# Patient Record
Sex: Female | Born: 1935 | Race: White | Hispanic: No | Marital: Married | Smoking: Former smoker
Health system: Southern US, Community
[De-identification: ages and names within clinical notes are randomized; demographics above are authoritative.]

## PROBLEM LIST (undated history)

## (undated) DIAGNOSIS — R0602 Shortness of breath: Secondary | ICD-10-CM

## (undated) DIAGNOSIS — K219 Gastro-esophageal reflux disease without esophagitis: Secondary | ICD-10-CM

## (undated) DIAGNOSIS — C52 Malignant neoplasm of vagina: Secondary | ICD-10-CM

## (undated) DIAGNOSIS — K449 Diaphragmatic hernia without obstruction or gangrene: Secondary | ICD-10-CM

## (undated) DIAGNOSIS — E119 Type 2 diabetes mellitus without complications: Secondary | ICD-10-CM

## (undated) DIAGNOSIS — N893 Dysplasia of vagina, unspecified: Secondary | ICD-10-CM

## (undated) DIAGNOSIS — M549 Dorsalgia, unspecified: Secondary | ICD-10-CM

## (undated) DIAGNOSIS — E039 Hypothyroidism, unspecified: Secondary | ICD-10-CM

## (undated) DIAGNOSIS — Z8679 Personal history of other diseases of the circulatory system: Secondary | ICD-10-CM

## (undated) DIAGNOSIS — T8859XA Other complications of anesthesia, initial encounter: Secondary | ICD-10-CM

## (undated) DIAGNOSIS — D126 Benign neoplasm of colon, unspecified: Secondary | ICD-10-CM

## (undated) DIAGNOSIS — R269 Unspecified abnormalities of gait and mobility: Secondary | ICD-10-CM

## (undated) DIAGNOSIS — M1990 Unspecified osteoarthritis, unspecified site: Secondary | ICD-10-CM

## (undated) DIAGNOSIS — Z9889 Other specified postprocedural states: Secondary | ICD-10-CM

## (undated) DIAGNOSIS — E785 Hyperlipidemia, unspecified: Secondary | ICD-10-CM

## (undated) DIAGNOSIS — K589 Irritable bowel syndrome without diarrhea: Secondary | ICD-10-CM

## (undated) DIAGNOSIS — L719 Rosacea, unspecified: Secondary | ICD-10-CM

## (undated) DIAGNOSIS — I509 Heart failure, unspecified: Secondary | ICD-10-CM

## (undated) DIAGNOSIS — R112 Nausea with vomiting, unspecified: Secondary | ICD-10-CM

## (undated) DIAGNOSIS — D509 Iron deficiency anemia, unspecified: Secondary | ICD-10-CM

## (undated) DIAGNOSIS — I10 Essential (primary) hypertension: Secondary | ICD-10-CM

## (undated) DIAGNOSIS — E669 Obesity, unspecified: Secondary | ICD-10-CM

## (undated) DIAGNOSIS — R42 Dizziness and giddiness: Secondary | ICD-10-CM

## (undated) DIAGNOSIS — T4145XA Adverse effect of unspecified anesthetic, initial encounter: Secondary | ICD-10-CM

## (undated) DIAGNOSIS — I4891 Unspecified atrial fibrillation: Secondary | ICD-10-CM

## (undated) HISTORY — DX: Personal history of other diseases of the circulatory system: Z86.79

## (undated) HISTORY — DX: Unspecified abnormalities of gait and mobility: R26.9

## (undated) HISTORY — DX: Dysplasia of vagina, unspecified: N89.3

## (undated) HISTORY — DX: Irritable bowel syndrome, unspecified: K58.9

## (undated) HISTORY — DX: Gastro-esophageal reflux disease without esophagitis: K21.9

## (undated) HISTORY — DX: Dizziness and giddiness: R42

## (undated) HISTORY — DX: Benign neoplasm of colon, unspecified: D12.6

## (undated) HISTORY — DX: Dorsalgia, unspecified: M54.9

## (undated) HISTORY — DX: Essential (primary) hypertension: I10

## (undated) HISTORY — DX: Hyperlipidemia, unspecified: E78.5

## (undated) HISTORY — DX: Obesity, unspecified: E66.9

## (undated) HISTORY — DX: Unspecified atrial fibrillation: I48.91

## (undated) HISTORY — DX: Diaphragmatic hernia without obstruction or gangrene: K44.9

## (undated) HISTORY — DX: Hypothyroidism, unspecified: E03.9

## (undated) HISTORY — DX: Rosacea, unspecified: L71.9

## (undated) HISTORY — PX: JOINT REPLACEMENT: SHX530

---
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
## 1961-08-01 HISTORY — PX: ABDOMINAL HYSTERECTOMY: SHX81

## 1961-08-01 HISTORY — PX: CERVICAL CONE BIOPSY: SUR198

## 1976-08-01 HISTORY — PX: CYSTOCELE REPAIR: SHX163

## 1978-08-01 DIAGNOSIS — N893 Dysplasia of vagina, unspecified: Secondary | ICD-10-CM

## 1978-08-01 HISTORY — DX: Dysplasia of vagina, unspecified: N89.3

## 1991-08-02 HISTORY — PX: TOTAL KNEE ARTHROPLASTY: SHX125

## 1998-09-03 ENCOUNTER — Other Ambulatory Visit: Admission: RE | Admit: 1998-09-03 | Discharge: 1998-09-03 | Payer: Self-pay | Admitting: Obstetrics and Gynecology

## 1999-08-02 HISTORY — PX: TOTAL KNEE ARTHROPLASTY: SHX125

## 1999-09-15 ENCOUNTER — Encounter: Admission: RE | Admit: 1999-09-15 | Discharge: 1999-09-15 | Payer: Self-pay | Admitting: Obstetrics and Gynecology

## 1999-09-15 ENCOUNTER — Encounter: Payer: Self-pay | Admitting: Obstetrics and Gynecology

## 1999-09-15 ENCOUNTER — Other Ambulatory Visit: Admission: RE | Admit: 1999-09-15 | Discharge: 1999-09-15 | Payer: Self-pay | Admitting: Obstetrics and Gynecology

## 1999-09-15 ENCOUNTER — Encounter: Payer: Self-pay | Admitting: Emergency Medicine

## 1999-09-16 ENCOUNTER — Inpatient Hospital Stay (HOSPITAL_COMMUNITY): Admission: EM | Admit: 1999-09-16 | Discharge: 1999-09-18 | Payer: Self-pay | Admitting: Emergency Medicine

## 1999-09-17 ENCOUNTER — Encounter: Payer: Self-pay | Admitting: Family Medicine

## 1999-09-24 ENCOUNTER — Encounter: Admission: RE | Admit: 1999-09-24 | Discharge: 1999-09-24 | Payer: Self-pay | Admitting: Obstetrics and Gynecology

## 1999-09-24 ENCOUNTER — Encounter: Payer: Self-pay | Admitting: Obstetrics and Gynecology

## 1999-11-17 ENCOUNTER — Encounter: Payer: Self-pay | Admitting: Orthopedic Surgery

## 1999-11-17 ENCOUNTER — Inpatient Hospital Stay (HOSPITAL_COMMUNITY): Admission: RE | Admit: 1999-11-17 | Discharge: 1999-11-22 | Payer: Self-pay | Admitting: Orthopedic Surgery

## 1999-11-22 ENCOUNTER — Inpatient Hospital Stay (HOSPITAL_COMMUNITY)
Admission: RE | Admit: 1999-11-22 | Discharge: 1999-11-26 | Payer: Self-pay | Admitting: Physical Medicine & Rehabilitation

## 1999-12-20 ENCOUNTER — Encounter: Admission: RE | Admit: 1999-12-20 | Discharge: 2000-01-17 | Payer: Self-pay | Admitting: Orthopedic Surgery

## 2000-02-15 ENCOUNTER — Ambulatory Visit (HOSPITAL_COMMUNITY): Admission: RE | Admit: 2000-02-15 | Discharge: 2000-02-15 | Payer: Self-pay | Admitting: Orthopedic Surgery

## 2000-06-01 ENCOUNTER — Encounter: Admission: RE | Admit: 2000-06-01 | Discharge: 2000-06-29 | Payer: Self-pay | Admitting: Orthopedic Surgery

## 2000-09-15 ENCOUNTER — Encounter: Payer: Self-pay | Admitting: Obstetrics and Gynecology

## 2000-09-15 ENCOUNTER — Encounter: Admission: RE | Admit: 2000-09-15 | Discharge: 2000-09-15 | Payer: Self-pay | Admitting: Obstetrics and Gynecology

## 2000-09-18 ENCOUNTER — Other Ambulatory Visit: Admission: RE | Admit: 2000-09-18 | Discharge: 2000-09-18 | Payer: Self-pay | Admitting: Obstetrics and Gynecology

## 2001-05-04 ENCOUNTER — Encounter: Admission: RE | Admit: 2001-05-04 | Discharge: 2001-08-02 | Payer: Self-pay | Admitting: Family Medicine

## 2001-08-28 ENCOUNTER — Encounter: Admission: RE | Admit: 2001-08-28 | Discharge: 2001-11-26 | Payer: Self-pay | Admitting: Internal Medicine

## 2001-09-19 ENCOUNTER — Encounter: Payer: Self-pay | Admitting: Obstetrics and Gynecology

## 2001-09-19 ENCOUNTER — Encounter: Admission: RE | Admit: 2001-09-19 | Discharge: 2001-09-19 | Payer: Self-pay | Admitting: Obstetrics and Gynecology

## 2001-10-02 ENCOUNTER — Other Ambulatory Visit: Admission: RE | Admit: 2001-10-02 | Discharge: 2001-10-02 | Payer: Self-pay | Admitting: Obstetrics and Gynecology

## 2002-10-16 ENCOUNTER — Encounter: Admission: RE | Admit: 2002-10-16 | Discharge: 2002-10-16 | Payer: Self-pay | Admitting: Obstetrics and Gynecology

## 2002-10-16 ENCOUNTER — Encounter: Payer: Self-pay | Admitting: Obstetrics and Gynecology

## 2002-11-30 HISTORY — PX: CARDIAC CATHETERIZATION: SHX172

## 2002-12-02 ENCOUNTER — Other Ambulatory Visit: Admission: RE | Admit: 2002-12-02 | Discharge: 2002-12-02 | Payer: Self-pay | Admitting: Obstetrics and Gynecology

## 2002-12-10 ENCOUNTER — Inpatient Hospital Stay (HOSPITAL_COMMUNITY): Admission: EM | Admit: 2002-12-10 | Discharge: 2002-12-11 | Payer: Self-pay | Admitting: Emergency Medicine

## 2002-12-10 ENCOUNTER — Encounter: Payer: Self-pay | Admitting: Cardiology

## 2002-12-16 ENCOUNTER — Ambulatory Visit (HOSPITAL_COMMUNITY): Admission: RE | Admit: 2002-12-16 | Discharge: 2002-12-16 | Payer: Self-pay | Admitting: Cardiology

## 2002-12-16 ENCOUNTER — Encounter: Payer: Self-pay | Admitting: Cardiology

## 2002-12-19 ENCOUNTER — Encounter: Payer: Self-pay | Admitting: Cardiology

## 2002-12-19 ENCOUNTER — Encounter: Admission: RE | Admit: 2002-12-19 | Discharge: 2002-12-19 | Payer: Self-pay | Admitting: Cardiology

## 2003-06-20 ENCOUNTER — Encounter: Admission: RE | Admit: 2003-06-20 | Discharge: 2003-06-20 | Payer: Self-pay | Admitting: Cardiology

## 2003-07-22 ENCOUNTER — Encounter: Admission: RE | Admit: 2003-07-22 | Discharge: 2003-07-22 | Payer: Self-pay | Admitting: Orthopedic Surgery

## 2003-07-24 ENCOUNTER — Encounter: Admission: RE | Admit: 2003-07-24 | Discharge: 2003-07-24 | Payer: Self-pay | Admitting: Orthopedic Surgery

## 2003-11-12 ENCOUNTER — Encounter: Admission: RE | Admit: 2003-11-12 | Discharge: 2003-11-12 | Payer: Self-pay | Admitting: Obstetrics and Gynecology

## 2003-12-10 ENCOUNTER — Other Ambulatory Visit: Admission: RE | Admit: 2003-12-10 | Discharge: 2003-12-10 | Payer: Self-pay | Admitting: Obstetrics and Gynecology

## 2003-12-15 ENCOUNTER — Encounter (INDEPENDENT_AMBULATORY_CARE_PROVIDER_SITE_OTHER): Payer: Self-pay | Admitting: *Deleted

## 2003-12-15 ENCOUNTER — Observation Stay (HOSPITAL_COMMUNITY): Admission: RE | Admit: 2003-12-15 | Discharge: 2003-12-16 | Payer: Self-pay | Admitting: *Deleted

## 2003-12-15 ENCOUNTER — Encounter (INDEPENDENT_AMBULATORY_CARE_PROVIDER_SITE_OTHER): Payer: Self-pay | Admitting: Specialist

## 2004-05-25 ENCOUNTER — Encounter: Payer: Self-pay | Admitting: Internal Medicine

## 2004-05-25 ENCOUNTER — Emergency Department (HOSPITAL_COMMUNITY): Admission: EM | Admit: 2004-05-25 | Discharge: 2004-05-25 | Payer: Self-pay | Admitting: Emergency Medicine

## 2004-07-05 ENCOUNTER — Ambulatory Visit: Payer: Self-pay | Admitting: Internal Medicine

## 2004-07-09 ENCOUNTER — Ambulatory Visit: Payer: Self-pay | Admitting: Internal Medicine

## 2004-07-14 ENCOUNTER — Encounter: Admission: RE | Admit: 2004-07-14 | Discharge: 2004-07-14 | Payer: Self-pay | Admitting: Cardiology

## 2004-07-15 ENCOUNTER — Ambulatory Visit: Payer: Self-pay | Admitting: Internal Medicine

## 2004-08-01 HISTORY — PX: ORIF ANKLE FRACTURE: SUR919

## 2004-08-01 HISTORY — PX: CHOLECYSTECTOMY: SHX55

## 2004-08-01 HISTORY — PX: CATARACT EXTRACTION W/ INTRAOCULAR LENS  IMPLANT, BILATERAL: SHX1307

## 2004-08-17 ENCOUNTER — Ambulatory Visit: Payer: Self-pay | Admitting: Internal Medicine

## 2004-09-15 ENCOUNTER — Ambulatory Visit: Payer: Self-pay | Admitting: Internal Medicine

## 2004-12-13 ENCOUNTER — Ambulatory Visit: Payer: Self-pay | Admitting: Internal Medicine

## 2004-12-15 ENCOUNTER — Encounter: Admission: RE | Admit: 2004-12-15 | Discharge: 2004-12-15 | Payer: Self-pay | Admitting: Obstetrics and Gynecology

## 2004-12-16 ENCOUNTER — Ambulatory Visit: Payer: Self-pay | Admitting: Internal Medicine

## 2005-01-14 ENCOUNTER — Encounter: Admission: RE | Admit: 2005-01-14 | Discharge: 2005-01-14 | Payer: Self-pay | Admitting: Cardiology

## 2005-04-05 ENCOUNTER — Ambulatory Visit: Payer: Self-pay | Admitting: Internal Medicine

## 2005-04-07 ENCOUNTER — Ambulatory Visit: Payer: Self-pay | Admitting: Internal Medicine

## 2005-04-21 ENCOUNTER — Encounter: Admission: RE | Admit: 2005-04-21 | Discharge: 2005-04-21 | Payer: Self-pay | Admitting: Orthopedic Surgery

## 2005-05-01 ENCOUNTER — Ambulatory Visit: Payer: Self-pay | Admitting: Physical Medicine & Rehabilitation

## 2005-05-01 ENCOUNTER — Inpatient Hospital Stay (HOSPITAL_COMMUNITY): Admission: EM | Admit: 2005-05-01 | Discharge: 2005-05-06 | Payer: Self-pay | Admitting: Emergency Medicine

## 2005-05-04 ENCOUNTER — Encounter: Payer: Self-pay | Admitting: Neurosurgery

## 2005-05-06 ENCOUNTER — Inpatient Hospital Stay
Admission: RE | Admit: 2005-05-06 | Discharge: 2005-05-13 | Payer: Self-pay | Admitting: Physical Medicine & Rehabilitation

## 2005-08-30 ENCOUNTER — Ambulatory Visit: Payer: Self-pay | Admitting: Internal Medicine

## 2005-09-22 ENCOUNTER — Emergency Department (HOSPITAL_COMMUNITY): Admission: EM | Admit: 2005-09-22 | Discharge: 2005-09-22 | Payer: Self-pay | Admitting: Family Medicine

## 2005-09-22 ENCOUNTER — Ambulatory Visit: Payer: Self-pay | Admitting: Internal Medicine

## 2005-11-30 ENCOUNTER — Ambulatory Visit: Payer: Self-pay | Admitting: Internal Medicine

## 2006-01-24 ENCOUNTER — Ambulatory Visit: Payer: Self-pay | Admitting: Internal Medicine

## 2006-02-06 ENCOUNTER — Encounter: Payer: Self-pay | Admitting: Cardiology

## 2006-02-06 ENCOUNTER — Inpatient Hospital Stay (HOSPITAL_COMMUNITY): Admission: EM | Admit: 2006-02-06 | Discharge: 2006-02-07 | Payer: Self-pay | Admitting: Emergency Medicine

## 2006-03-03 ENCOUNTER — Encounter: Payer: Self-pay | Admitting: Internal Medicine

## 2006-04-25 ENCOUNTER — Encounter: Payer: Self-pay | Admitting: Internal Medicine

## 2006-05-26 ENCOUNTER — Ambulatory Visit: Payer: Self-pay | Admitting: Internal Medicine

## 2006-08-15 ENCOUNTER — Encounter: Payer: Self-pay | Admitting: Internal Medicine

## 2006-09-26 ENCOUNTER — Ambulatory Visit: Payer: Self-pay | Admitting: Internal Medicine

## 2006-09-27 ENCOUNTER — Encounter: Admission: RE | Admit: 2006-09-27 | Discharge: 2006-09-27 | Payer: Self-pay | Admitting: Obstetrics and Gynecology

## 2007-01-12 DIAGNOSIS — K219 Gastro-esophageal reflux disease without esophagitis: Secondary | ICD-10-CM | POA: Insufficient documentation

## 2007-01-12 DIAGNOSIS — L719 Rosacea, unspecified: Secondary | ICD-10-CM

## 2007-01-12 DIAGNOSIS — K449 Diaphragmatic hernia without obstruction or gangrene: Secondary | ICD-10-CM | POA: Insufficient documentation

## 2007-01-12 DIAGNOSIS — I10 Essential (primary) hypertension: Secondary | ICD-10-CM | POA: Insufficient documentation

## 2007-01-12 DIAGNOSIS — E119 Type 2 diabetes mellitus without complications: Secondary | ICD-10-CM | POA: Insufficient documentation

## 2007-01-12 DIAGNOSIS — E785 Hyperlipidemia, unspecified: Secondary | ICD-10-CM

## 2007-01-12 DIAGNOSIS — E039 Hypothyroidism, unspecified: Secondary | ICD-10-CM | POA: Insufficient documentation

## 2007-01-12 DIAGNOSIS — I4891 Unspecified atrial fibrillation: Secondary | ICD-10-CM | POA: Insufficient documentation

## 2007-01-29 ENCOUNTER — Ambulatory Visit: Payer: Self-pay | Admitting: Internal Medicine

## 2007-01-30 LAB — CONVERTED CEMR LAB
ALT: 18 units/L (ref 0–35)
CO2: 33 meq/L — ABNORMAL HIGH (ref 19–32)
Calcium: 9.3 mg/dL (ref 8.4–10.5)
Chloride: 103 meq/L (ref 96–112)
Cholesterol: 122 mg/dL (ref 0–200)
Free T4: 1.2 ng/dL (ref 0.6–1.6)
GFR calc Af Amer: 77 mL/min
Glucose, Bld: 123 mg/dL — ABNORMAL HIGH (ref 70–99)
HDL: 35.8 mg/dL — ABNORMAL LOW (ref >39.0)
VLDL: 20 mg/dL (ref 0–40)

## 2007-02-22 ENCOUNTER — Ambulatory Visit: Payer: Self-pay | Admitting: Internal Medicine

## 2007-02-22 LAB — CONVERTED CEMR LAB: INR: 2.2

## 2007-03-22 ENCOUNTER — Ambulatory Visit: Payer: Self-pay | Admitting: Internal Medicine

## 2007-04-05 ENCOUNTER — Ambulatory Visit: Payer: Self-pay | Admitting: Internal Medicine

## 2007-04-05 LAB — CONVERTED CEMR LAB: INR: 2

## 2007-05-03 ENCOUNTER — Ambulatory Visit: Payer: Self-pay | Admitting: Internal Medicine

## 2007-05-03 LAB — CONVERTED CEMR LAB

## 2007-06-05 ENCOUNTER — Ambulatory Visit: Payer: Self-pay | Admitting: Internal Medicine

## 2007-06-05 LAB — CONVERTED CEMR LAB: INR: 1.7

## 2007-06-06 ENCOUNTER — Telehealth: Payer: Self-pay | Admitting: Internal Medicine

## 2007-06-06 LAB — CONVERTED CEMR LAB
ALT: 16 units/L (ref 0–35)
AST: 19 units/L (ref 0–37)
Albumin: 3.5 g/dL (ref 3.5–5.2)
Alkaline Phosphatase: 40 units/L (ref 39–117)
Basophils Absolute: 0.1 10*3/uL (ref 0.0–0.1)
CO2: 32 meq/L (ref 19–32)
Creatinine, Ser: 1 mg/dL (ref 0.4–1.2)
HCT: 26.6 % — ABNORMAL LOW (ref 36.0–46.0)
MCHC: 33.1 g/dL (ref 30.0–36.0)
Monocytes Relative: 9.9 % (ref 3.0–11.0)
Phosphorus: 3 mg/dL (ref 2.3–4.6)
Platelets: 354 10*3/uL (ref 150–400)
RBC: 3.4 M/uL — ABNORMAL LOW (ref 3.87–5.11)
RDW: 17.9 % — ABNORMAL HIGH (ref 11.5–14.6)
Sodium: 140 meq/L (ref 135–145)

## 2007-06-08 ENCOUNTER — Encounter (INDEPENDENT_AMBULATORY_CARE_PROVIDER_SITE_OTHER): Payer: Self-pay | Admitting: *Deleted

## 2007-06-15 ENCOUNTER — Telehealth: Payer: Self-pay | Admitting: Internal Medicine

## 2007-06-18 ENCOUNTER — Ambulatory Visit: Payer: Self-pay | Admitting: Internal Medicine

## 2007-06-18 LAB — CONVERTED CEMR LAB
INR: 1.7
Prothrombin Time: 15.8 s

## 2007-06-22 ENCOUNTER — Telehealth (INDEPENDENT_AMBULATORY_CARE_PROVIDER_SITE_OTHER): Payer: Self-pay | Admitting: *Deleted

## 2007-06-29 ENCOUNTER — Ambulatory Visit: Payer: Self-pay | Admitting: Family Medicine

## 2007-06-29 DIAGNOSIS — L538 Other specified erythematous conditions: Secondary | ICD-10-CM

## 2007-06-29 LAB — CONVERTED CEMR LAB
Bilirubin Urine: NEGATIVE
Glucose, Urine, Semiquant: 100
Nitrite: POSITIVE
Prothrombin Time: 19.5 s
Specific Gravity, Urine: 1.01
pH: 6

## 2007-07-05 ENCOUNTER — Ambulatory Visit: Payer: Self-pay | Admitting: Internal Medicine

## 2007-07-09 ENCOUNTER — Ambulatory Visit: Payer: Self-pay | Admitting: Internal Medicine

## 2007-07-10 LAB — CONVERTED CEMR LAB
Eosinophils Absolute: 0.2 10*3/uL (ref 0.0–0.6)
Eosinophils Relative: 2.3 % (ref 0.0–5.0)
Hgb A1c MFr Bld: 10.3 % — ABNORMAL HIGH (ref 4.6–6.0)
MCV: 77.5 fL — ABNORMAL LOW (ref 78.0–100.0)
Monocytes Relative: 6.3 % (ref 3.0–11.0)
Platelets: 352 10*3/uL (ref 150–400)
RBC: 4.47 M/uL (ref 3.87–5.11)
WBC: 8.1 10*3/uL (ref 4.5–10.5)

## 2007-07-19 ENCOUNTER — Ambulatory Visit: Payer: Self-pay | Admitting: Internal Medicine

## 2007-07-19 LAB — CONVERTED CEMR LAB
INR: 3.4
Prothrombin Time: 22.2 s

## 2007-07-27 ENCOUNTER — Ambulatory Visit: Payer: Self-pay | Admitting: Internal Medicine

## 2007-07-30 ENCOUNTER — Ambulatory Visit: Payer: Self-pay | Admitting: Internal Medicine

## 2007-07-30 LAB — CONVERTED CEMR LAB: Prothrombin Time: 18.6 s

## 2007-07-31 ENCOUNTER — Telehealth: Payer: Self-pay | Admitting: Internal Medicine

## 2007-08-13 ENCOUNTER — Ambulatory Visit: Payer: Self-pay | Admitting: Internal Medicine

## 2007-08-13 LAB — CONVERTED CEMR LAB: INR: 3.4

## 2007-09-03 ENCOUNTER — Ambulatory Visit: Payer: Self-pay | Admitting: Family Medicine

## 2007-09-03 ENCOUNTER — Ambulatory Visit: Payer: Self-pay | Admitting: Internal Medicine

## 2007-09-03 DIAGNOSIS — K589 Irritable bowel syndrome without diarrhea: Secondary | ICD-10-CM | POA: Insufficient documentation

## 2007-09-04 LAB — CONVERTED CEMR LAB
INR: 2.4 — ABNORMAL HIGH (ref 0.0–1.5)
Prothrombin Time: 27.2 s — ABNORMAL HIGH (ref 11.6–15.2)

## 2007-09-05 ENCOUNTER — Encounter: Admission: RE | Admit: 2007-09-05 | Discharge: 2007-09-05 | Payer: Self-pay | Admitting: Family Medicine

## 2007-09-05 ENCOUNTER — Telehealth (INDEPENDENT_AMBULATORY_CARE_PROVIDER_SITE_OTHER): Payer: Self-pay | Admitting: *Deleted

## 2007-10-09 ENCOUNTER — Ambulatory Visit: Payer: Self-pay | Admitting: Family Medicine

## 2007-10-09 LAB — CONVERTED CEMR LAB
H Pylori IgG: NEGATIVE
INR: 2.8
Prothrombin Time: 20.1 s

## 2007-10-15 LAB — CONVERTED CEMR LAB
ALT: 19 units/L (ref 0–35)
BUN: 15 mg/dL (ref 6–23)
Basophils Relative: 1.6 % — ABNORMAL HIGH (ref 0.0–1.0)
CO2: 30 meq/L (ref 19–32)
Creatinine, Ser: 0.9 mg/dL (ref 0.4–1.2)
Eosinophils Absolute: 0.2 10*3/uL (ref 0.0–0.6)
Eosinophils Relative: 3.5 % (ref 0.0–5.0)
Ferritin: 17.6 ng/mL (ref 10.0–291.0)
Glucose, Bld: 167 mg/dL — ABNORMAL HIGH (ref 70–99)
HCT: 31.3 % — ABNORMAL LOW (ref 36.0–46.0)
Hemoglobin: 10.6 g/dL — ABNORMAL LOW (ref 12.0–15.0)
Hgb A1c MFr Bld: 8.8 % — ABNORMAL HIGH (ref 4.6–6.0)
Lymphocytes Relative: 22.1 % (ref 12.0–46.0)
MCV: 88.7 fL (ref 78.0–100.0)
Neutro Abs: 4.1 10*3/uL (ref 1.4–7.7)
Neutrophils Relative %: 62.4 % (ref 43.0–77.0)
Phosphorus: 3.4 mg/dL (ref 2.3–4.6)
Potassium: 3.4 meq/L — ABNORMAL LOW (ref 3.5–5.1)
Triglycerides: 151 mg/dL — ABNORMAL HIGH (ref 0–149)
VLDL: 30 mg/dL (ref 0–40)
WBC: 6.5 10*3/uL (ref 4.5–10.5)

## 2007-10-16 ENCOUNTER — Encounter: Payer: Self-pay | Admitting: Family Medicine

## 2007-10-16 ENCOUNTER — Emergency Department (HOSPITAL_COMMUNITY): Admission: EM | Admit: 2007-10-16 | Discharge: 2007-10-16 | Payer: Self-pay | Admitting: Family Medicine

## 2007-10-19 ENCOUNTER — Ambulatory Visit: Payer: Self-pay | Admitting: Family Medicine

## 2007-10-31 ENCOUNTER — Ambulatory Visit: Payer: Self-pay | Admitting: Family Medicine

## 2007-11-07 ENCOUNTER — Telehealth: Payer: Self-pay | Admitting: Family Medicine

## 2007-11-14 ENCOUNTER — Encounter: Payer: Self-pay | Admitting: Family Medicine

## 2007-11-19 ENCOUNTER — Ambulatory Visit: Payer: Self-pay | Admitting: Family Medicine

## 2007-11-19 LAB — CONVERTED CEMR LAB: INR: 6.3

## 2007-11-20 LAB — CONVERTED CEMR LAB
INR: 6.8 (ref 0.8–1.0)
Prothrombin Time: 64.4 s (ref 10.9–13.3)

## 2007-11-22 ENCOUNTER — Ambulatory Visit: Payer: Self-pay | Admitting: Family Medicine

## 2007-11-22 LAB — CONVERTED CEMR LAB: Prothrombin Time: 19.5 s

## 2007-12-03 ENCOUNTER — Ambulatory Visit: Payer: Self-pay | Admitting: Family Medicine

## 2007-12-03 LAB — CONVERTED CEMR LAB: Prothrombin Time: 16 s

## 2007-12-10 ENCOUNTER — Ambulatory Visit: Payer: Self-pay | Admitting: Family Medicine

## 2007-12-10 LAB — CONVERTED CEMR LAB: INR: 2

## 2007-12-25 ENCOUNTER — Ambulatory Visit: Payer: Self-pay | Admitting: Family Medicine

## 2007-12-31 DIAGNOSIS — K449 Diaphragmatic hernia without obstruction or gangrene: Secondary | ICD-10-CM

## 2007-12-31 DIAGNOSIS — D126 Benign neoplasm of colon, unspecified: Secondary | ICD-10-CM

## 2007-12-31 HISTORY — DX: Diaphragmatic hernia without obstruction or gangrene: K44.9

## 2007-12-31 HISTORY — DX: Benign neoplasm of colon, unspecified: D12.6

## 2008-01-08 ENCOUNTER — Ambulatory Visit: Payer: Self-pay | Admitting: Family Medicine

## 2008-01-09 LAB — CONVERTED CEMR LAB
Albumin: 3.5 g/dL (ref 3.5–5.2)
Basophils Absolute: 0.1 10*3/uL (ref 0.0–0.1)
Calcium: 9.3 mg/dL (ref 8.4–10.5)
Eosinophils Absolute: 0.2 10*3/uL (ref 0.0–0.7)
GFR calc Af Amer: 55 mL/min
GFR calc non Af Amer: 46 mL/min
HCT: 33.2 % — ABNORMAL LOW (ref 36.0–46.0)
Hemoglobin: 11.2 g/dL — ABNORMAL LOW (ref 12.0–15.0)
MCHC: 33.6 g/dL (ref 30.0–36.0)
Monocytes Absolute: 0.7 10*3/uL (ref 0.1–1.0)
Neutro Abs: 4.7 10*3/uL (ref 1.4–7.7)
Phosphorus: 3.5 mg/dL (ref 2.3–4.6)
Platelets: 298 10*3/uL (ref 150–400)
Potassium: 4.6 meq/L (ref 3.5–5.1)
RDW: 16.1 % — ABNORMAL HIGH (ref 11.5–14.6)
Sodium: 141 meq/L (ref 135–145)

## 2008-01-17 ENCOUNTER — Ambulatory Visit: Payer: Self-pay | Admitting: Gastroenterology

## 2008-01-21 ENCOUNTER — Ambulatory Visit: Payer: Self-pay | Admitting: Internal Medicine

## 2008-01-21 LAB — CONVERTED CEMR LAB

## 2008-01-22 ENCOUNTER — Telehealth: Payer: Self-pay | Admitting: Gastroenterology

## 2008-01-25 ENCOUNTER — Encounter: Payer: Self-pay | Admitting: Gastroenterology

## 2008-01-25 ENCOUNTER — Ambulatory Visit: Payer: Self-pay | Admitting: Gastroenterology

## 2008-01-29 ENCOUNTER — Encounter: Payer: Self-pay | Admitting: Gastroenterology

## 2008-02-04 ENCOUNTER — Ambulatory Visit: Payer: Self-pay | Admitting: Family Medicine

## 2008-02-06 ENCOUNTER — Telehealth: Payer: Self-pay | Admitting: Gastroenterology

## 2008-02-06 LAB — CONVERTED CEMR LAB
INR: 2.5 — ABNORMAL HIGH (ref 0.8–1.0)
Prothrombin Time: 27.1 s — ABNORMAL HIGH (ref 10.9–13.3)

## 2008-02-13 ENCOUNTER — Emergency Department (HOSPITAL_COMMUNITY): Admission: EM | Admit: 2008-02-13 | Discharge: 2008-02-13 | Payer: Self-pay | Admitting: Emergency Medicine

## 2008-02-14 ENCOUNTER — Encounter: Payer: Self-pay | Admitting: Family Medicine

## 2008-02-15 ENCOUNTER — Ambulatory Visit: Payer: Self-pay | Admitting: Family Medicine

## 2008-02-15 LAB — CONVERTED CEMR LAB: Rapid Strep: NEGATIVE

## 2008-02-16 ENCOUNTER — Encounter: Payer: Self-pay | Admitting: Family Medicine

## 2008-02-18 ENCOUNTER — Telehealth (INDEPENDENT_AMBULATORY_CARE_PROVIDER_SITE_OTHER): Payer: Self-pay | Admitting: *Deleted

## 2008-02-19 ENCOUNTER — Ambulatory Visit: Payer: Self-pay | Admitting: Family Medicine

## 2008-02-19 LAB — CONVERTED CEMR LAB
INR: 2.9
Prothrombin Time: 20.5 s

## 2008-02-25 ENCOUNTER — Encounter (INDEPENDENT_AMBULATORY_CARE_PROVIDER_SITE_OTHER): Payer: Self-pay

## 2008-02-29 ENCOUNTER — Ambulatory Visit: Payer: Self-pay | Admitting: Gastroenterology

## 2008-02-29 LAB — CONVERTED CEMR LAB: OCCULT 4: NEGATIVE

## 2008-03-06 ENCOUNTER — Ambulatory Visit: Payer: Self-pay | Admitting: Family Medicine

## 2008-03-06 LAB — CONVERTED CEMR LAB
INR: 3.3
Prothrombin Time: 22 s

## 2008-03-11 ENCOUNTER — Ambulatory Visit: Payer: Self-pay | Admitting: Family Medicine

## 2008-03-11 LAB — CONVERTED CEMR LAB: Prothrombin Time: 17.2 s

## 2008-03-25 ENCOUNTER — Ambulatory Visit: Payer: Self-pay | Admitting: Family Medicine

## 2008-03-25 LAB — CONVERTED CEMR LAB: Prothrombin Time: 18.2 s

## 2008-04-10 ENCOUNTER — Ambulatory Visit: Payer: Self-pay | Admitting: Family Medicine

## 2008-04-14 LAB — CONVERTED CEMR LAB
Albumin: 3.8 g/dL (ref 3.5–5.2)
CO2: 31 meq/L (ref 19–32)
Calcium: 9.4 mg/dL (ref 8.4–10.5)
GFR calc Af Amer: 61 mL/min
GFR calc non Af Amer: 50 mL/min
HDL: 29.7 mg/dL — ABNORMAL LOW (ref >39.0)
Sodium: 144 meq/L (ref 135–145)
Total CHOL/HDL Ratio: 3.4
VLDL: 22 mg/dL (ref 0–40)

## 2008-04-22 ENCOUNTER — Ambulatory Visit: Payer: Self-pay | Admitting: Family Medicine

## 2008-04-28 ENCOUNTER — Encounter: Payer: Self-pay | Admitting: Family Medicine

## 2008-04-30 ENCOUNTER — Ambulatory Visit: Payer: Self-pay | Admitting: Family Medicine

## 2008-05-08 ENCOUNTER — Ambulatory Visit: Payer: Self-pay | Admitting: Family Medicine

## 2008-05-08 ENCOUNTER — Telehealth: Payer: Self-pay | Admitting: Gastroenterology

## 2008-05-08 LAB — CONVERTED CEMR LAB
INR: 2.3
Prothrombin Time: 18.5 s

## 2008-05-09 ENCOUNTER — Telehealth: Payer: Self-pay | Admitting: Gastroenterology

## 2008-05-26 ENCOUNTER — Ambulatory Visit: Payer: Self-pay | Admitting: Gastroenterology

## 2008-05-26 ENCOUNTER — Encounter (INDEPENDENT_AMBULATORY_CARE_PROVIDER_SITE_OTHER): Payer: Self-pay | Admitting: *Deleted

## 2008-05-26 DIAGNOSIS — D126 Benign neoplasm of colon, unspecified: Secondary | ICD-10-CM | POA: Insufficient documentation

## 2008-05-28 ENCOUNTER — Telehealth: Payer: Self-pay | Admitting: Gastroenterology

## 2008-06-03 ENCOUNTER — Ambulatory Visit: Payer: Self-pay | Admitting: Family Medicine

## 2008-06-03 LAB — CONVERTED CEMR LAB: INR: 1.5

## 2008-06-04 ENCOUNTER — Encounter: Payer: Self-pay | Admitting: Gastroenterology

## 2008-06-04 ENCOUNTER — Ambulatory Visit: Payer: Self-pay | Admitting: Gastroenterology

## 2008-06-06 ENCOUNTER — Encounter: Payer: Self-pay | Admitting: Gastroenterology

## 2008-06-10 ENCOUNTER — Telehealth: Payer: Self-pay | Admitting: Family Medicine

## 2008-07-01 ENCOUNTER — Ambulatory Visit: Payer: Self-pay | Admitting: Family Medicine

## 2008-07-01 LAB — CONVERTED CEMR LAB: Prothrombin Time: 20.6 s

## 2008-07-29 ENCOUNTER — Ambulatory Visit: Payer: Self-pay | Admitting: Family Medicine

## 2008-07-30 LAB — CONVERTED CEMR LAB
Albumin: 3.4 g/dL — ABNORMAL LOW (ref 3.5–5.2)
Basophils Absolute: 0.1 10*3/uL (ref 0.0–0.1)
Calcium: 8.9 mg/dL (ref 8.4–10.5)
GFR calc Af Amer: 68 mL/min
GFR calc non Af Amer: 56 mL/min
Glucose, Bld: 173 mg/dL — ABNORMAL HIGH (ref 70–99)
HCT: 36 % (ref 36.0–46.0)
Hemoglobin: 12.4 g/dL (ref 12.0–15.0)
MCHC: 34.4 g/dL (ref 30.0–36.0)
Monocytes Absolute: 0.8 10*3/uL (ref 0.1–1.0)
Neutro Abs: 3.5 10*3/uL (ref 1.4–7.7)
RDW: 15 % — ABNORMAL HIGH (ref 11.5–14.6)
Sodium: 140 meq/L (ref 135–145)

## 2008-08-06 ENCOUNTER — Telehealth: Payer: Self-pay | Admitting: Family Medicine

## 2008-08-07 ENCOUNTER — Encounter: Payer: Self-pay | Admitting: Family Medicine

## 2008-08-26 ENCOUNTER — Ambulatory Visit: Payer: Self-pay | Admitting: Family Medicine

## 2008-08-26 LAB — CONVERTED CEMR LAB
INR: 2
Prothrombin Time: 17.6 s

## 2008-09-23 ENCOUNTER — Ambulatory Visit: Payer: Self-pay | Admitting: Family Medicine

## 2008-09-23 LAB — CONVERTED CEMR LAB: INR: 1.8

## 2008-10-20 ENCOUNTER — Encounter: Payer: Self-pay | Admitting: Internal Medicine

## 2008-10-28 ENCOUNTER — Ambulatory Visit: Payer: Self-pay | Admitting: Family Medicine

## 2008-10-28 LAB — CONVERTED CEMR LAB
INR: 2.9
Prothrombin Time: 20.7 s

## 2008-10-29 LAB — CONVERTED CEMR LAB
Basophils Absolute: 0.1 10*3/uL (ref 0.0–0.1)
Creatinine, Ser: 0.9 mg/dL (ref 0.4–1.2)
Eosinophils Absolute: 0.2 10*3/uL (ref 0.0–0.7)
Glucose, Bld: 131 mg/dL — ABNORMAL HIGH (ref 70–99)
HCT: 38.6 % (ref 36.0–46.0)
Hgb A1c MFr Bld: 7.3 % — ABNORMAL HIGH (ref 4.6–6.5)
Lymphs Abs: 1.7 10*3/uL (ref 0.7–4.0)
MCHC: 34.2 g/dL (ref 30.0–36.0)
MCV: 94.5 fL (ref 78.0–100.0)
Monocytes Absolute: 0.7 10*3/uL (ref 0.1–1.0)
Neutrophils Relative %: 62.5 % (ref 43.0–77.0)
Phosphorus: 3.7 mg/dL (ref 2.3–4.6)
Platelets: 223 10*3/uL (ref 150.0–400.0)
Potassium: 2.9 meq/L — ABNORMAL LOW (ref 3.5–5.1)
RDW: 13.7 % (ref 11.5–14.6)
Total CHOL/HDL Ratio: 4
Triglycerides: 150 mg/dL — ABNORMAL HIGH (ref 0.0–149.0)

## 2008-10-30 ENCOUNTER — Ambulatory Visit: Payer: Self-pay | Admitting: Family Medicine

## 2008-11-25 ENCOUNTER — Ambulatory Visit: Payer: Self-pay | Admitting: Family Medicine

## 2008-11-26 LAB — CONVERTED CEMR LAB: Potassium: 4.1 meq/L (ref 3.5–5.1)

## 2008-12-12 ENCOUNTER — Telehealth (INDEPENDENT_AMBULATORY_CARE_PROVIDER_SITE_OTHER): Payer: Self-pay | Admitting: *Deleted

## 2008-12-12 ENCOUNTER — Telehealth: Payer: Self-pay | Admitting: Internal Medicine

## 2008-12-12 ENCOUNTER — Ambulatory Visit: Payer: Self-pay | Admitting: Family Medicine

## 2008-12-12 ENCOUNTER — Encounter (INDEPENDENT_AMBULATORY_CARE_PROVIDER_SITE_OTHER): Payer: Self-pay | Admitting: *Deleted

## 2008-12-16 ENCOUNTER — Encounter: Admission: RE | Admit: 2008-12-16 | Discharge: 2008-12-16 | Payer: Self-pay | Admitting: Orthopedic Surgery

## 2008-12-16 ENCOUNTER — Encounter: Payer: Self-pay | Admitting: Family Medicine

## 2008-12-25 ENCOUNTER — Ambulatory Visit: Payer: Self-pay | Admitting: Family Medicine

## 2008-12-25 LAB — CONVERTED CEMR LAB
INR: 2.1
Prothrombin Time: 18 s

## 2008-12-31 ENCOUNTER — Telehealth: Payer: Self-pay | Admitting: Family Medicine

## 2008-12-31 ENCOUNTER — Encounter (INDEPENDENT_AMBULATORY_CARE_PROVIDER_SITE_OTHER): Payer: Self-pay | Admitting: *Deleted

## 2009-01-23 ENCOUNTER — Ambulatory Visit: Payer: Self-pay | Admitting: Family Medicine

## 2009-01-23 LAB — CONVERTED CEMR LAB
INR: 1.4
Prothrombin Time: 14.4 s

## 2009-02-05 ENCOUNTER — Ambulatory Visit: Payer: Self-pay | Admitting: Family Medicine

## 2009-02-05 LAB — CONVERTED CEMR LAB: Prothrombin Time: 15.8 s

## 2009-02-12 ENCOUNTER — Ambulatory Visit: Payer: Self-pay | Admitting: Family Medicine

## 2009-02-26 ENCOUNTER — Ambulatory Visit: Payer: Self-pay | Admitting: Family Medicine

## 2009-02-26 LAB — CONVERTED CEMR LAB
INR: 2.8
Prothrombin Time: 20.4 s

## 2009-03-26 ENCOUNTER — Ambulatory Visit: Payer: Self-pay | Admitting: Family Medicine

## 2009-03-26 LAB — CONVERTED CEMR LAB: INR: 2.7

## 2009-04-02 ENCOUNTER — Telehealth: Payer: Self-pay | Admitting: Family Medicine

## 2009-04-23 ENCOUNTER — Ambulatory Visit: Payer: Self-pay | Admitting: Family Medicine

## 2009-04-23 LAB — CONVERTED CEMR LAB: Prothrombin Time: 23.1 s

## 2009-04-30 ENCOUNTER — Ambulatory Visit: Payer: Self-pay | Admitting: Family Medicine

## 2009-05-15 ENCOUNTER — Ambulatory Visit: Payer: Self-pay | Admitting: Family Medicine

## 2009-06-09 ENCOUNTER — Encounter: Payer: Self-pay | Admitting: Family Medicine

## 2009-06-11 ENCOUNTER — Ambulatory Visit: Payer: Self-pay | Admitting: Family Medicine

## 2009-06-11 ENCOUNTER — Telehealth: Payer: Self-pay | Admitting: Family Medicine

## 2009-06-11 LAB — CONVERTED CEMR LAB: INR: 1.7

## 2009-06-18 ENCOUNTER — Ambulatory Visit: Payer: Self-pay | Admitting: Family Medicine

## 2009-06-18 LAB — CONVERTED CEMR LAB

## 2009-07-15 ENCOUNTER — Ambulatory Visit: Payer: Self-pay | Admitting: Family Medicine

## 2009-07-15 LAB — CONVERTED CEMR LAB: INR: 2.6

## 2009-07-17 ENCOUNTER — Telehealth: Payer: Self-pay | Admitting: Family Medicine

## 2009-07-17 LAB — CONVERTED CEMR LAB
Albumin: 3.6 g/dL (ref 3.5–5.2)
Alkaline Phosphatase: 38 units/L — ABNORMAL LOW (ref 39–117)
Bilirubin, Direct: 0 mg/dL (ref 0.0–0.3)
Calcium: 9.1 mg/dL (ref 8.4–10.5)
Chloride: 99 meq/L (ref 96–112)
Cholesterol: 113 mg/dL (ref 0–200)
LDL Cholesterol: 52 mg/dL (ref 0–99)
Phosphorus: 4 mg/dL (ref 2.3–4.6)
Potassium: 3.9 meq/L (ref 3.5–5.1)
Total Protein: 7.5 g/dL (ref 6.0–8.3)

## 2009-07-21 ENCOUNTER — Encounter: Payer: Self-pay | Admitting: Family Medicine

## 2009-08-01 HISTORY — PX: ANKLE FRACTURE SURGERY: SHX122

## 2009-08-17 ENCOUNTER — Ambulatory Visit: Payer: Self-pay | Admitting: Family Medicine

## 2009-08-17 LAB — CONVERTED CEMR LAB: INR: 2.6

## 2009-08-19 ENCOUNTER — Encounter: Payer: Self-pay | Admitting: Family Medicine

## 2009-09-02 ENCOUNTER — Encounter: Admission: RE | Admit: 2009-09-02 | Discharge: 2009-10-08 | Payer: Self-pay | Admitting: Neurology

## 2009-09-14 ENCOUNTER — Ambulatory Visit: Payer: Self-pay | Admitting: Family Medicine

## 2009-09-14 LAB — CONVERTED CEMR LAB
INR: 1.8
Prothrombin Time: 16.1 s

## 2009-09-28 ENCOUNTER — Telehealth (INDEPENDENT_AMBULATORY_CARE_PROVIDER_SITE_OTHER): Payer: Self-pay

## 2009-10-15 ENCOUNTER — Ambulatory Visit: Payer: Self-pay | Admitting: Family Medicine

## 2009-10-15 LAB — CONVERTED CEMR LAB: INR: 2.5

## 2009-10-18 LAB — CONVERTED CEMR LAB
Hgb A1c MFr Bld: 7.9 % — ABNORMAL HIGH (ref 4.6–6.5)
Microalb Creat Ratio: 80.4 mg/g — ABNORMAL HIGH (ref 0.0–30.0)

## 2009-10-19 ENCOUNTER — Telehealth: Payer: Self-pay | Admitting: Family Medicine

## 2009-10-29 ENCOUNTER — Telehealth: Payer: Self-pay | Admitting: Family Medicine

## 2009-11-06 ENCOUNTER — Encounter: Payer: Self-pay | Admitting: Family Medicine

## 2009-11-17 ENCOUNTER — Ambulatory Visit: Payer: Self-pay | Admitting: Family Medicine

## 2009-11-26 ENCOUNTER — Telehealth: Payer: Self-pay | Admitting: Family Medicine

## 2009-11-30 ENCOUNTER — Telehealth: Payer: Self-pay | Admitting: Family Medicine

## 2009-12-03 ENCOUNTER — Encounter: Payer: Self-pay | Admitting: Family Medicine

## 2009-12-03 ENCOUNTER — Telehealth: Payer: Self-pay | Admitting: Family Medicine

## 2009-12-16 ENCOUNTER — Ambulatory Visit: Payer: Self-pay | Admitting: Family Medicine

## 2009-12-16 ENCOUNTER — Telehealth: Payer: Self-pay | Admitting: Family Medicine

## 2009-12-16 LAB — CONVERTED CEMR LAB: INR: 2.3

## 2009-12-21 ENCOUNTER — Telehealth: Payer: Self-pay | Admitting: Family Medicine

## 2009-12-22 ENCOUNTER — Ambulatory Visit: Payer: Self-pay | Admitting: Family Medicine

## 2009-12-23 ENCOUNTER — Emergency Department (HOSPITAL_COMMUNITY): Admission: EM | Admit: 2009-12-23 | Discharge: 2009-12-23 | Payer: Self-pay | Admitting: Emergency Medicine

## 2009-12-23 ENCOUNTER — Telehealth: Payer: Self-pay | Admitting: Family Medicine

## 2009-12-24 ENCOUNTER — Telehealth: Payer: Self-pay | Admitting: Family Medicine

## 2009-12-25 ENCOUNTER — Telehealth: Payer: Self-pay | Admitting: Family Medicine

## 2009-12-29 ENCOUNTER — Ambulatory Visit: Payer: Self-pay | Admitting: Family Medicine

## 2009-12-29 LAB — CONVERTED CEMR LAB
INR: 1.3
Prothrombin Time: 15 s

## 2010-01-07 ENCOUNTER — Encounter: Admission: RE | Admit: 2010-01-07 | Discharge: 2010-04-07 | Payer: Self-pay | Admitting: Neurology

## 2010-01-13 ENCOUNTER — Ambulatory Visit: Payer: Self-pay | Admitting: Family Medicine

## 2010-01-13 DIAGNOSIS — J310 Chronic rhinitis: Secondary | ICD-10-CM | POA: Insufficient documentation

## 2010-01-13 LAB — CONVERTED CEMR LAB
INR: 1.8
Prothrombin Time: 22.1 s

## 2010-01-23 LAB — CONVERTED CEMR LAB
BUN: 31 mg/dL — ABNORMAL HIGH (ref 6–23)
Chloride: 102 meq/L (ref 96–112)
Creatinine, Ser: 1.2 mg/dL (ref 0.4–1.2)
GFR calc non Af Amer: 44.95 mL/min (ref >60)
Glucose, Bld: 155 mg/dL — ABNORMAL HIGH (ref 70–99)
Phosphorus: 3.6 mg/dL (ref 2.3–4.6)

## 2010-02-02 ENCOUNTER — Encounter: Payer: Self-pay | Admitting: Family Medicine

## 2010-02-11 ENCOUNTER — Ambulatory Visit: Payer: Self-pay | Admitting: Family Medicine

## 2010-02-23 ENCOUNTER — Telehealth: Payer: Self-pay | Admitting: Family Medicine

## 2010-02-25 ENCOUNTER — Ambulatory Visit: Payer: Self-pay | Admitting: Family Medicine

## 2010-02-27 ENCOUNTER — Inpatient Hospital Stay (HOSPITAL_COMMUNITY): Admission: EM | Admit: 2010-02-27 | Discharge: 2010-03-03 | Payer: Self-pay | Admitting: Emergency Medicine

## 2010-03-04 ENCOUNTER — Encounter: Payer: Self-pay | Admitting: Family Medicine

## 2010-03-08 ENCOUNTER — Telehealth: Payer: Self-pay | Admitting: Family Medicine

## 2010-03-08 ENCOUNTER — Encounter: Payer: Self-pay | Admitting: Family Medicine

## 2010-04-12 ENCOUNTER — Encounter: Payer: Self-pay | Admitting: Family Medicine

## 2010-04-15 ENCOUNTER — Encounter (INDEPENDENT_AMBULATORY_CARE_PROVIDER_SITE_OTHER): Payer: Self-pay | Admitting: *Deleted

## 2010-04-21 ENCOUNTER — Encounter: Admission: RE | Admit: 2010-04-21 | Discharge: 2010-04-21 | Payer: Self-pay | Admitting: Obstetrics and Gynecology

## 2010-05-12 ENCOUNTER — Encounter: Payer: Self-pay | Admitting: Family Medicine

## 2010-06-04 ENCOUNTER — Telehealth: Payer: Self-pay | Admitting: Family Medicine

## 2010-06-08 ENCOUNTER — Telehealth: Payer: Self-pay | Admitting: Family Medicine

## 2010-06-09 ENCOUNTER — Encounter: Payer: Self-pay | Admitting: Family Medicine

## 2010-06-15 ENCOUNTER — Telehealth: Payer: Self-pay | Admitting: Family Medicine

## 2010-06-15 ENCOUNTER — Encounter: Payer: Self-pay | Admitting: Family Medicine

## 2010-06-23 ENCOUNTER — Ambulatory Visit: Payer: Self-pay | Admitting: Family Medicine

## 2010-06-28 ENCOUNTER — Telehealth: Payer: Self-pay | Admitting: Family Medicine

## 2010-06-30 ENCOUNTER — Ambulatory Visit: Payer: Self-pay | Admitting: Family Medicine

## 2010-06-30 DIAGNOSIS — M810 Age-related osteoporosis without current pathological fracture: Secondary | ICD-10-CM | POA: Insufficient documentation

## 2010-06-30 LAB — CONVERTED CEMR LAB
ALT: 13 units/L (ref 0–35)
Albumin: 3.8 g/dL (ref 3.5–5.2)
Alkaline Phosphatase: 54 units/L (ref 39–117)
Basophils Absolute: 0 10*3/uL (ref 0.0–0.1)
Bilirubin, Direct: 0.1 mg/dL (ref 0.0–0.3)
CO2: 28 meq/L (ref 19–32)
Calcium: 8.7 mg/dL (ref 8.4–10.5)
Chloride: 102 meq/L (ref 96–112)
Eosinophils Absolute: 0.2 10*3/uL (ref 0.0–0.7)
HCT: 35.2 % — ABNORMAL LOW (ref 36.0–46.0)
HDL: 35.2 mg/dL — ABNORMAL LOW (ref >39.00)
Hgb A1c MFr Bld: 8 % — ABNORMAL HIGH (ref 4.6–6.5)
Lymphocytes Relative: 23.2 % (ref 12.0–46.0)
Lymphs Abs: 1.4 10*3/uL (ref 0.7–4.0)
MCHC: 32.1 g/dL (ref 30.0–36.0)
Monocytes Relative: 9.9 % (ref 3.0–12.0)
Neutro Abs: 3.9 10*3/uL (ref 1.4–7.7)
Platelets: 266 10*3/uL (ref 150.0–400.0)
Potassium: 4.4 meq/L (ref 3.5–5.1)
RDW: 21.9 % — ABNORMAL HIGH (ref 11.5–14.6)
Sodium: 142 meq/L (ref 135–145)
TSH: 1.74 microintl units/mL (ref 0.35–5.50)
Total Bilirubin: 0.7 mg/dL (ref 0.3–1.2)
Total CHOL/HDL Ratio: 3
Total Protein: 7.3 g/dL (ref 6.0–8.3)

## 2010-07-05 ENCOUNTER — Encounter: Payer: Self-pay | Admitting: Family Medicine

## 2010-07-05 ENCOUNTER — Telehealth: Payer: Self-pay | Admitting: Family Medicine

## 2010-07-05 ENCOUNTER — Encounter (INDEPENDENT_AMBULATORY_CARE_PROVIDER_SITE_OTHER): Payer: Self-pay | Admitting: *Deleted

## 2010-07-07 ENCOUNTER — Encounter: Payer: Self-pay | Admitting: Family Medicine

## 2010-07-12 ENCOUNTER — Ambulatory Visit: Payer: Self-pay | Admitting: Family Medicine

## 2010-07-12 DIAGNOSIS — E559 Vitamin D deficiency, unspecified: Secondary | ICD-10-CM | POA: Insufficient documentation

## 2010-07-23 ENCOUNTER — Ambulatory Visit: Payer: Self-pay | Admitting: Family Medicine

## 2010-07-27 LAB — CONVERTED CEMR LAB
INR: 2.5
Prothrombin Time: 29.6 s

## 2010-08-18 ENCOUNTER — Encounter: Payer: Self-pay | Admitting: Family Medicine

## 2010-08-22 ENCOUNTER — Encounter: Payer: Self-pay | Admitting: Obstetrics and Gynecology

## 2010-08-24 ENCOUNTER — Ambulatory Visit: Admit: 2010-08-24 | Payer: Self-pay | Admitting: Family Medicine

## 2010-08-25 ENCOUNTER — Ambulatory Visit
Admission: RE | Admit: 2010-08-25 | Discharge: 2010-08-25 | Payer: Self-pay | Source: Home / Self Care | Attending: Family Medicine | Admitting: Family Medicine

## 2010-08-25 LAB — CONVERTED CEMR LAB
INR: 2.9
Prothrombin Time: 34.6 s

## 2010-09-02 ENCOUNTER — Ambulatory Visit (INDEPENDENT_AMBULATORY_CARE_PROVIDER_SITE_OTHER): Payer: MEDICARE | Admitting: Cardiology

## 2010-09-02 DIAGNOSIS — I4891 Unspecified atrial fibrillation: Secondary | ICD-10-CM

## 2010-09-02 NOTE — Progress Notes (Signed)
Summary: regarding labs   Phone Note Call from Patient Call back at Home Phone [phone]   Caller: Patient Call For: [name] MD Summary of Call: Patient has a follow up appt. on 12-9 and she is asking if you would like labs done before the visit. She says that she when she was at [facility] for rehab they took her off almost all of her meds. She wasn't sure right off of the changes, but said that she will have the ready for you at her appt. Please advise.  Initial call taken by: Melody Comas,  June 28, 2010 11:01 AM  Follow-up for Phone Call        I would like to get labs  renal/ hepatic/ cbc with diff/tsh/ lipids/ AIC 244.9, 401.1, 250.0, and also vit D level for 733.0-- thanks Follow-up by: [name] MD,  June 28, 2010 1:24 PM  Additional Follow-up for Phone Call Additional follow up Details #1::        Patient notified as instructed by telephone. Fasting lab appointment scheduled as instructed 06/30/10 at [facility] LPN  June 28, 2010 4:42 PM

## 2010-09-02 NOTE — Progress Notes (Signed)
Summary: ? about pain med  Phone Note Call from Patient   Caller: Patient Call For: [name] MD Summary of Call: Patient is in severe pain and wants to know if she can take Oxycodone. She has a RX that is still current. Wanrted to know because she is on Coumadin. Initial call taken by: Mills Koller,  November 26, 2009 12:39 PM  Follow-up for Phone Call        what is she taking it for/ from whom/ how much ? Follow-up by: [name] MD,  November 26, 2009 1:01 PM  Additional Follow-up for Phone Call Additional follow up Details #1::        patient called back to say the medication is hydrocodone 500-5mg  tabs, Dr Alphonsus Sias. expired Rob at [facility] said they would be ok to take. For T - spine and  L - spine pain.  She has taken a 1 tablet Additional Follow-up by: Mills Koller,  November 26, 2009 2:04 PM    Additional Follow-up for Phone Call Additional follow up Details #2::    ok if she uses caution- can make her dizzy /sleepy and more prone to falls f/u if not improved  Follow-up by: [name] MD,  November 26, 2009 3:10 PM  Additional Follow-up for Phone Call Additional follow up Details #3:: Details for Additional Follow-up Action Taken: Patient notified as instructed by telephone. Lewanda Rife LPN  November 26, 2009 3:20 PM

## 2010-09-02 NOTE — Progress Notes (Signed)
Summary: requesting pain med.  Phone Note Call from Patient Call back at Home Phone [phone]   Caller: Patient Call For: [name] MD Summary of Call: Patient is requesting pain meds. I explained to her that she should really be evaluated for the pain that she is having. She says that she can't walk because the pain is so bad and can't driver here and has no one that could bring her in because they would have to carry her to the car and she says that she is too heavy for them. She says that the pain starts in her back and and goes all the way down her leg on her right side. She says that she tried the oxycodone that she had called and asked about, but it didn't help at all so she is no longer taking that. Uses [facility] if needed Please advise.  Initial call taken by: Melody Comas,  Nov 30, 2009 1:40 PM  Follow-up for Phone Call        she needs to be seen by ortho just as soon as she can get someone to take her  I cannot px anything stronger than oxycodone  I will go ahead and do ref to ortho if she feels her pain is too severe to follow through with that - then I want her to call 911 for transport to hospital for eval (if that is the only way to get her moved )   Follow-up by: [name] MD,  Nov 30, 2009 1:51 PM  Additional Follow-up for Phone Call Additional follow up Details #1::        Patient notified, she will wait for call about referral.  Additional Follow-up by: Melody Comas,  Nov 30, 2009 2:56 PM  New Problems: BACK PAIN, ACUTE (ICD-724.5)   Additional Follow-up for Phone Call Additional follow up Details #2::    Appt made with Dr Nathaneil Canary on 12/03/2009 at 9:30am. Follow-up by: Carlton Adam,  Dec 01, 2009 2:52 PM  New Problems: BACK PAIN, ACUTE (ICD-724.5)

## 2010-09-02 NOTE — Progress Notes (Signed)
Summary: Pt to have cortisone injection 12/29/09  Phone Note Call from Patient Call back at [phone]   Caller: Patient Call For: [name] MD Summary of Call: Pt is to have Cortisone injection by Dr Regino Schultze on 12/29/09. Dr Regino Schultze does not prescribe Lovenox and pt had called 12/16/09 and was told to hold Coumadin for 5 days prior to procedure and bridge with Lovenox. Dr Milinda Antis wanted to be notified date of procedure and she would advise further . Pt uses [facility] if needed. Aurther Loft is scheduling a PT prior to procedure. Please advise.  Initial call taken by: Lewanda Rife LPN,  Dec 21, 2009 3:07 PM  Follow-up for Phone Call        stop coumadin on 5/25 on 5/26 start lovenox injection (px written on EMR for call in ) twice daily in am and pm  continue twice daily injection on 27, 28, 29, 30 (for total of 5 days )  do not inject on day of proceedure-- which is the 31st  start coumadin back as soon as her doctor says it is safe after injection  if she needs instruction on how to inject - have her come in for nurse visit  Follow-up by: [name] MD,  Dec 21, 2009 4:58 PM  Additional Follow-up for Phone Call Additional follow up Details #1::        Patient notified as instructed by telephone. Pt will talk wlith husband and then call back to schedule nurse visit. Medication phoned to [facility] as instructed. Lewanda Rife LPN  Dec 21, 2009 5:20 PM     New/Updated Medications: LOVENOX 120 MG/0.8ML SOLN (ENOXAPARIN SODIUM) inject .8 ML as directed two times a day for 5 days before proceedure Prescriptions: LOVENOX 120 MG/0.8ML SOLN (ENOXAPARIN SODIUM) inject .8 ML as directed two times a day for 5 days before proceedure  #10 doses x 0   Entered and Authorized by:   [name] MD   Signed by:   Lewanda Rife LPN on 45/40/9811   Method used:   Telephoned to ...       [facility]* (retail)       [address]       [place], [place]  [address]       Ph: [phone]       Fax:  [phone]   RxID:   [id number]   Appended Document: Pt to have cortisone injection 12/29/09 Copy of instructions given to pt.

## 2010-09-02 NOTE — Progress Notes (Signed)
Summary: ? meds/ bump on head   Phone Note Call from Patient Call back at Home Phone [phone]   Caller: Patient Call For: [name] MD Summary of Call: Patient says that she takes tylenol and ecotrin daily and wants to know if it will be okay to continue taking it while doint the lovenox injections. She also says that on wed. the 18th she fell back and hit her head. There was no obvious wound or blood. The nex day she noticed a bump on head and looks bruised. Since then has been feeling dizzy on and off and having headaches. She wants to know if she should be really concerned about this.  Initial call taken by: Melody Comas,  Dec 23, 2009 11:05 AM  Follow-up for Phone Call        if she is having headaches and dizziness after head trauma that is concerning -- could have bruise "inside brain" or concussion -- especially concerning with blood thinners  this needs to be checked out with a cat scan or other imaging  needs f/u with first availible and if nothing is -- then go to ER for eval so they can check her out  I want her to hold her asa while on lovenox and until after her injection   Follow-up by: [name] MD,  Dec 23, 2009 11:39 AM  Additional Follow-up for Phone Call Additional follow up Details #1::        Patient notified as instructed by telephone. Pt said she is also nauseated and had diarrhea several times this morning. Pt is going to call EMS to take her to ER now.Lewanda Rife LPN  Dec 23, 2009 1:19 PM      Appended Document: ? meds/ bump on head  Pt was also advised to tell ER doctor supposed to start Lovenox on 12/24/09. Dr Milinda Antis was concerned with pt starting Lovenox before checked out with h/a and dizziness since fall.

## 2010-09-02 NOTE — Letter (Signed)
Summary: [facility]  [facility]   Imported By: Lanelle Bal 12/08/2009 10:34:38  _____________________________________________________________________  External Attachment:    Type:   Image     Comment:   External Document

## 2010-09-02 NOTE — Miscellaneous (Signed)
Summary: Fax Regarding Patient Med List/[facility]  Fax Regarding Patient Med List/[facility]   Imported By: Lanelle Bal 03/15/2010 10:54:56  _____________________________________________________________________  External Attachment:    Type:   Image     Comment:   External Document

## 2010-09-02 NOTE — Progress Notes (Signed)
Summary: refill request for coumadin  Phone Note Refill Request Message from:  Fax from Pharmacy  Refills Requested: Medication #1:  COUMADIN 5 MG  TABS take by mouth as directed Faxed form from [facility] is on your shelf.  Initial call taken by: Lowella Petties CMA,  October 29, 2009 1:01 PM  Follow-up for Phone Call        form done and in nurse in box  Follow-up by: [name] MD,  October 29, 2009 1:29 PM  Additional Follow-up for Phone Call Additional follow up Details #1::        Completed form faxed to [phone] by Lyla Son. Lewanda Rife LPN  October 30, 4032 8:27 AM     New/Updated Medications: COUMADIN 5 MG  TABS (WARFARIN SODIUM) take by mouth as directed Prescriptions: COUMADIN 5 MG  TABS (WARFARIN SODIUM) take by mouth as directed  #90 x 3   Entered and Authorized by:   [name] MD   Signed by:   Lewanda Rife LPN on 74/25/9563   Method used:   Historical   RxID:   [id number]

## 2010-09-02 NOTE — Progress Notes (Signed)
  Phone Note Call from Patient   Caller: Patient Call For: [name] MD Summary of Call: Patient wanted to know instructions  reguarding Warfarin for her to have a steroid injection. Initial call taken by: Mills Koller,  Dec 16, 2009 2:57 PM  Follow-up for Phone Call        will plan to hold coumadin for about 5 days and bridge with lovenox  let me know date of proceedure and I will adv further Follow-up by: [name] MD,  Dec 16, 2009 5:07 PM  Additional Follow-up for Phone Call Additional follow up Details #1::        Partient notified to contact her Ortho Dr and schedule her appt. Additional Follow-up by: Mills Koller,  Dec 16, 2009 2:57 PM

## 2010-09-02 NOTE — Miscellaneous (Signed)
Summary: med list update- pen needles  Clinical Lists Changes  Medications: Added new medication of BD PEN NEEDLE ULTRAFINE 29G X 12.7MM MISC (INSULIN PEN NEEDLE) use as directed with insulin injections     Prior Medications: LEVOTHYROXINE SODIUM 150 MCG  TABS (LEVOTHYROXINE SODIUM) Take 1 tablet by mouth once a day GLUCOTROL 10 MG  TABS (GLIPIZIDE) Take 1 tablet by mouth two times a day INDAPAMIDE 2.5 MG  TABS (INDAPAMIDE) Take 1 tablet by mouth once daily OMEPRAZOLE 20 MG  CPDR (OMEPRAZOLE) Take 1 tablet by mouth once a day VERAPAMIL HCL CR 240 MG  CP24 (VERAPAMIL HCL) Take 1 tablet by mouth once a day SIMVASTATIN 40 MG  TABS (SIMVASTATIN) Take 1 tablet by mouth once a day FUROSEMIDE 40 MG  TABS (FUROSEMIDE) Take 1 tablet by mouth once a day COUMADIN 5 MG  TABS (WARFARIN SODIUM) take by mouth as directed POTASSIUM CHLORIDE CRYS CR 20 MEQ CR-TABS (POTASSIUM CHLORIDE CRYS CR) take one by mouth two times a day METOPROLOL TARTRATE 50 MG  TABS (METOPROLOL TARTRATE) Take 1/2 by month two times a day METFORMIN HCL 1000 MG  TABS (METFORMIN HCL) Take one by mouth two times a day ONETOUCH ULTRA TEST  STRP (GLUCOSE BLOOD) Check blood sugar four times a day PROVENTIL HFA 108 (90 BASE) MCG/ACT AERS (ALBUTEROL SULFATE) 2 puffs up to every 4 hours as needed wheezing ULTRAM 50 MG TABS (TRAMADOL HCL) 1 by mouth up to three times a day as needed pain IMODIUM A-D 2 MG TABS (LOPERAMIDE HCL) as needed ECOTRIN 325 MG TBEC (ASPIRIN) Take 1 tablet by mouth once a day PRANDIN 2 MG TABS (REPAGLINIDE) 1 by mouth 30 minutes before meals three times a day BD PEN NEEDLE ULTRAFINE 29G X 12.7MM MISC (INSULIN PEN NEEDLE) use as directed with insulin injections Current Allergies: PHENERGAN (PROMETHAZINE HCL) REGLAN (METOCLOPRAMIDE HCL) * NSAIDS GROUP * ONGLYZA

## 2010-09-02 NOTE — Letter (Signed)
Summary: [facility]  [facility]   Imported By: Lanelle Bal 07/13/2010 14:06:01  _____________________________________________________________________  External Attachment:    Type:   Image     Comment:   External Document

## 2010-09-02 NOTE — Letter (Signed)
Summary: Encounter Notice/[facility]  Encounter [facility]   Imported By: Lanelle Bal 03/10/2010 13:14:04  _____________________________________________________________________  External Attachment:    Type:   Image     Comment:   External Document

## 2010-09-02 NOTE — Letter (Signed)
Summary: [facility]  [facility]   Imported By: Lanelle Bal 12/08/2009 10:37:54  _____________________________________________________________________  External Attachment:    Type:   Image     Comment:   External Document

## 2010-09-02 NOTE — Progress Notes (Signed)
Summary: regarding ER visit  Phone Note Call from Patient   Caller: Patient Call For: [name] MD Summary of Call: Pt had CT and blood work at ER  yesterday and everything checked out fine.  They told her she might have some vertigo.  She is going to go to physical therepy for this, per Dr. Anne Hahn.  Also, she is asking if she should avoid any of her meds when she goes for her cortisone shot next week.  Also, what can she take for pain, since she cant take nsaids or tylenol- or will you allow her to take tylenol?  She says you had told her not to take that. Initial call taken by: Lowella Petties CMA,  Dec 24, 2009 10:36 AM  Follow-up for Phone Call        thanks for the update - that is re- assuring  I think she has ultram on her list for pain control  avoid aspirin before proceedure proceed with lovenox as planned   Follow-up by: [name] MD,  Dec 24, 2009 12:28 PM  Additional Follow-up for Phone Call Additional follow up Details #1::        Patient Advised.    Additional Follow-up by: Delilah Shan CMA Duncan Dull),  Dec 24, 2009 2:29 PM

## 2010-09-02 NOTE — Assessment & Plan Note (Signed)
Summary: 6 MONTH FOLLWO UP/RBH R/S 01/19/10   Vital Signs:  Patient profile:   77 year old female Height:      62 inches Weight:      245.75 pounds BMI:     45.11 Temp:     98.2 degrees F oral Pulse rate:   72 / minute Pulse rhythm:   irregular BP sitting:   118 / 70  (left arm) Cuff size:   large  Vitals Entered By: Lewanda Rife LPN (January 13, 2010 12:44 PM) CC: six month f/u   History of Present Illness: here for f/u of DM and HTN and thyroid  had back injection - was on lovenox - getting backon coumadin  that made her worse instead of better  can barely walk  has appt with ortho in july  spinal stenosis   seeing Dr Anne Hahn for dizziness/ vertigo- PT for vertigo- has gone one time  had CT in ER for this  wonders if she has sinus problems  has a lot of drainage in the ams - gets choked easily  not congestion - rarely  ears feel full occasionally  uses hearing aid occasionally    thyroid stable in dec  DM AIC high in dec- pt ref more med sugars did go up for a while after her shot -- was above 200 now usually 160 or less   chol well controlled with LDL 52  118/70- good HTN control     Allergies: 1)  Phenergan (Promethazine Hcl) 2)  Reglan (Metoclopramide Hcl) 3)  * Nsaids Group  Past History:  Past Surgical History: Last updated: 06/09/2008 Hysterectomy 1963 Cholecysectomy 2006 L TKR  2001 R TKR 1993 foot fracture  Conization d+c 1963 Appendectomy 1941 Cystocele /rectocele 1978 Laser for vaginal dysplasia 1981 or 84 6/09 EGD gastric polyps 6/09 colonosc- adenomatous polyps- re check 6 mo (one partially removed -- needs f/u) 111/09 colonoscopy adenomatous polyp- re check 2y  Family History: Last updated: 05/26/2008 mother leukemia father CAD- atherosclerosis, dementia brother heart problems and diabetes brother died of MI at 96 sister died of COPD- smoking Family History of Colon Cancer: brother diagnosed age late 22's Family History of Kidney  Disease: son  Social History: Last updated: 07/29/2008 Retired--office work [employer] Married-2nd  Has 3 sons she cares for her chronically sick husband  quit smoking 1950s Alcohol use-no  Has living will No prolonged artifial ventilation. No tube feeds if vegetative or unaware  Risk Factors: Smoking Status: never (01/12/2007)  Past Medical History: Atrial fibrillation Diabetes mellitus, type II Anemia- Fe deficiency GERD Hyperlipidemia Hypertension Hypothyroidism obesity  Vaginal dysplasia--1980 Rosacea Chest pain-- cardiac cath neg. 11/2002 Back pain IBS with diarrhea  asthma as a child  Adenomatous colon polyps 12/2007 Hemorrhoids vertigo  CONSULTANTS Dr Arna Snipe  [phone] Dr Olegario Messier Richardson--gyn Dr Rushie Goltz Dr Antonieta Loveless Dr Marygrace Drought Dr Russella Dar-- GI  Review of Systems General:  Complains of fatigue; denies loss of appetite. Eyes:  Denies blurring and eye pain. ENT:  Complains of postnasal drainage; denies earache and sore throat. CV:  Denies chest pain or discomfort, lightheadness, palpitations, and shortness of breath with exertion. Resp:  Denies cough, shortness of breath, and wheezing. GI:  Complains of diarrhea; denies abdominal pain, change in bowel habits, indigestion, and nausea; chronic diarrhea- she thinks from her metformin. GU:  Denies dysuria and urinary frequency. MS:  Complains of joint pain, low back pain, mid back pain, and stiffness; denies joint redness, joint swelling, and muscle weakness. Derm:  Denies  itching, lesion(s), poor wound healing, and rash. Neuro:  Denies numbness and tingling. Psych:  mood is fair . Endo:  Denies cold intolerance, excessive thirst, excessive urination, and heat intolerance. Heme:  Denies abnormal bruising and bleeding; did bruise from lovenox injections - that is better now. Allergy:  Complains of seasonal allergies.  Physical Exam  General:  overweight but generally well  appearing in wheelchair today due to back pain   Head:  normocephalic, atraumatic, and no abnormalities observed.  no sinus tenderness Eyes:  vision grossly intact, pupils equal, pupils round, and pupils reactive to light.  no conjunctival pallor, injection or icterus  Ears:  R ear normal and L ear normal.  -some scarring on both TMs  Nose:  nares are boggy but clear  Mouth:  pharynx pink and moist.   Neck:  supple with full rom and no masses or thyromegally, no JVD or carotid bruit  Chest Wall:  No deformities, masses, or tenderness noted. Lungs:  Normal respiratory effort, chest expands symmetrically. Lungs are clear to auscultation, no crackles or wheezes. Heart:  Normal rate and regular rhythm. S1 and S2 normal without gallop, murmur, click, rub or other extra sounds. Abdomen:  soft, non-tender, and normal bowel sounds.   1 by 1 cm soft subcutaneous mass felt L lat abd consistent with lipoma  Msk:  No deformity or scoliosis noted of thoracic or lumbar spine.  poor rom spine/ knees  needs assist with ambulation Pulses:  plus one pedal pulses today Extremities:  1 plus lymphedema in legs -- with hyperemia that is baseline / no warmth Neurologic:  sensation intact to light touch, gait normal, and DTRs symmetrical and normal.   Skin:  2 by 3 cm lipoma noted on r forearm-nontender  some sks  no rash Cervical Nodes:  No lymphadenopathy noted Psych:  seems down today due to pain and medical problems    Impression & Recommendations:  Problem # 1:  DIABETES MELLITUS, TYPE II (ICD-250.00) Assessment Deteriorated  expect AIc to be up from cortisone shot  pt not compliant with diet wants trial off the metformin -- due to chronic diarrhea  explained that if we had to replace it - would likely be injectible tx - she will think about it  urged her to work on diet (commended on more protien )   The following medications were removed from the medication list:    Adprin B 325 Mg Tabs (Aspirin  buf(cacarb-mgcarb-mgo)) .Marland Kitchen... Take 1 tablet by mouth once a day Her updated medication list for this problem includes:    Glucotrol 10 Mg Tabs (Glipizide) .Marland Kitchen... Take 1 tablet by mouth two times a day    Metformin Hcl 1000 Mg Tabs (Metformin hcl) .Marland Kitchen... Take one by mouth two times a day    Ecotrin 325 Mg Tbec (Aspirin) .Marland Kitchen... Take 1 tablet by mouth once a day  Orders: Venipuncture (04540) TLB-Renal Function Panel (80069-RENAL) TLB-A1C / Hgb A1C (Glycohemoglobin) (83036-A1C)  Labs Reviewed: Creat: 1.1 (07/15/2009)     Last Eye Exam: normal (12/31/2007) Reviewed HgBA1c results: 7.9 (10/15/2009)  8.3 (07/15/2009)  Problem # 2:  HYPOTHYROIDISM (ICD-244.9) Assessment: Unchanged stable tsh in december- no clinical change Her updated medication list for this problem includes:    Levothyroxine Sodium 150 Mcg Tabs (Levothyroxine sodium) .Marland Kitchen... Take 1 tablet by mouth once a day  Problem # 3:  HYPERLIPIDEMIA (ICD-272.4) Assessment: Unchanged  very good control with current med - no change  rev low sat fat diet  Her  updated medication list for this problem includes:    Simvastatin 40 Mg Tabs (Simvastatin) .Marland Kitchen... Take 1 tablet by mouth once a day  Labs Reviewed: SGOT: 24 (07/15/2009)   SGPT: 17 (07/15/2009)   HDL:33.40 (07/15/2009), 35.10 (10/28/2008)  LDL:52 (07/15/2009), 62 (10/28/2008)  Chol:113 (07/15/2009), 127 (10/28/2008)  Trig:136.0 (07/15/2009), 150.0 (10/28/2008)  Problem # 4:  COUMADIN THERAPY (ICD-V58.61) Assessment: Unchanged INR is coming back up with re -start- no problems protime today  Problem # 5:  CHRONIC RHINITIS (ICD-472.0) Assessment: New offered pt nasal spray like flonase to help with chronic drip and possibly vertigo  she declined now - does not want more medicine  will call back if she changes her mind  Problem # 6:  BACK PAIN, ACUTE (ICD-724.5) Assessment: Unchanged ongoing with hx of spinal stenosis unfortunately no relief from recent injection urged pt to  f/u with her ortho physician to make further plan The following medications were removed from the medication list:    Adprin B 325 Mg Tabs (Aspirin buf(cacarb-mgcarb-mgo)) .Marland Kitchen... Take 1 tablet by mouth once a day Her updated medication list for this problem includes:    Ultram 50 Mg Tabs (Tramadol hcl) .Marland Kitchen... 1 by mouth up to three times a day as needed pain    Ecotrin 325 Mg Tbec (Aspirin) .Marland Kitchen... Take 1 tablet by mouth once a day  Complete Medication List: 1)  Levothyroxine Sodium 150 Mcg Tabs (Levothyroxine sodium) .... Take 1 tablet by mouth once a day 2)  Glucotrol 10 Mg Tabs (Glipizide) .... Take 1 tablet by mouth two times a day 3)  Indapamide 2.5 Mg Tabs (Indapamide) .... Take 1 tablet by mouth once daily 4)  Omeprazole 20 Mg Cpdr (Omeprazole) .... Take 1 tablet by mouth once a day 5)  Verapamil Hcl Cr 240 Mg Cp24 (Verapamil hcl) .... Take 1 tablet by mouth once a day 6)  Simvastatin 40 Mg Tabs (Simvastatin) .... Take 1 tablet by mouth once a day 7)  Furosemide 40 Mg Tabs (Furosemide) .... Take 1 tablet by mouth once a day 8)  Coumadin 5 Mg Tabs (Warfarin sodium) .... Take by mouth as directed 9)  Potassium Chloride Crys Cr 20 Meq Cr-tabs (Potassium chloride crys cr) .... Take one by mouth two times a day 10)  Metoprolol Tartrate 50 Mg Tabs (Metoprolol tartrate) .... Take 1/2 by month two times a day 11)  Metformin Hcl 1000 Mg Tabs (Metformin hcl) .... Take one by mouth two times a day 12)  Onetouch Ultra Test Strp (Glucose blood) .... Check blood sugar four times a day 13)  Proventil Hfa 108 (90 Base) Mcg/act Aers (Albuterol sulfate) .... 2 puffs up to every 4 hours as needed wheezing 14)  Ultram 50 Mg Tabs (Tramadol hcl) .Marland Kitchen.. 1 by mouth up to three times a day as needed pain 15)  Imodium A-d 2 Mg Tabs (Loperamide hcl) .... As needed 16)  Ecotrin 325 Mg Tbec (Aspirin) .... Take 1 tablet by mouth once a day  Patient Instructions: 1)  if you want to try a week off metformin for diarrhea  -- stop it for a week and let me know how you do - with sugars and diarrhea  2)  if we had to try another diabetes medicine - it would likely be injectible and more expensive 3)  please try to eat a diabetic diet  4)  labs today - then will advise you further   Current Allergies (reviewed today): PHENERGAN (PROMETHAZINE HCL) REGLAN (METOCLOPRAMIDE HCL) *  NSAIDS GROUP

## 2010-09-02 NOTE — Progress Notes (Signed)
Summary:  METOPROLOL TARTRATE 50 MG  TABS  Phone Note Refill Request Call back at Home Phone [phone] Message from:  Patient on September 28, 2009 9:44 AM  Refills Requested: Medication #1:  METOPROLOL TARTRATE 50 MG  TABS Take 1/2 by month two times a day would like it sent to [place].   Initial call taken by: Melody Comas,  September 28, 2009 9:45 AM    Prescriptions: METOPROLOL TARTRATE 50 MG  TABS (METOPROLOL TARTRATE) Take 1/2 by month two times a day  #90 x 3   Entered by:   Delilah Shan CMA (AAMA)   Authorized by:   [name] MD   Signed by:   Delilah Shan CMA (AAMA) on 09/28/2009   Method used:   Electronically to        [facility]* (retail)       [address]       [place], [place]  [address]       Ph: [phone]       Fax: [phone]   RxID:   [id number]

## 2010-09-02 NOTE — Letter (Signed)
Summary: Colonoscopy-Changed to Office Visit Letter  [facility]  [address] [place], [place] [address]   Phone: [phone]  Fax: [phone]      April 15, 2010 MRN: [id number]   [facility] [address] [place], [place]  [address]   Dear [name],   According to our records, it is time for you to schedule a Colonoscopy. However, after reviewing your medical record, I feel that an office visit would be most appropriate to more completely evaluate you and determine your need for a repeat procedure.  Please call [phone] (option #2) at your convenience to schedule an office visit. If you have any questions, concerns, or feel that this letter is in error, we would appreciate your call.   Sincerely,  Judie Petit T. Russella Dar, M.D.  [facility] [phone]

## 2010-09-02 NOTE — Progress Notes (Signed)
Summary: ? if can take cortisone inj  and wants pain med  Phone Note Call from Patient Call back at [phone]   Caller: Patient Call For: [name] MD Summary of Call:  Morrie Sheldon took call from Pt. Pt seen at orthopedic dr this AM. Pt said Dr. Yevette Edwards  would not give her a cortisone shot because she is on Coumadin. Orthopedist wants approval to give cortisone injection from Dr Milinda Antis and wants to know if pt should stop Coumadin if so when? Pt also requested pain med from ortho and was told to get pain med from primary care dr. Rock Nephew uses [facility] pharmacy if needed. Pt understands Dr. Milinda Antis will not be back in office until 12/04/09. Please advise.  Initial call taken by: Lewanda Rife LPN,  Dec 03, 1476 3:19 PM  Follow-up for Phone Call        I need note from that orthopedic and I will re- review- thanks  Follow-up by: [name] MD,  Dec 03, 2009 4:31 PM  Additional Follow-up for Phone Call Additional follow up Details #1::        Ifaxed request for records to Dr Dumonski's office [phone]. When I called for records was told needed written request.Rena [facility] LPN  Dec 05, 863 8:31 AM   Patient notified as instructed by telephone. Lewanda Rife LPN  Dec 04, 7844 11:30 AM   Spoke with ruth at Dr Dumonski's office [phone] and was told no dictation yet so it would be Monday before we would possibly get records.Please advise. Lewanda Rife LPN  Dec 05, 4130 11:47 AM     Additional Follow-up for Phone Call Additional follow up Details #2::    thanks- I will be waiting for those records  this is tricky because the previous vicodin did not help -- and I need to give something safe with coumadin and also that wil not make her too dizzy/ fall risk  can try ultram-- px written on EMR for call in  use caution- can make pts a little woozy and also may aff coumadin a little bit (I think next PT is may 18th) please update me monday to see how this works for her -- I will wait for records to make further  plan and will plan when to next check PT /INR  Follow-up by: [name] MD,  Dec 04, 2009 1:51 PM  Additional Follow-up for Phone Call Additional follow up Details #3:: Details for Additional Follow-up Action Taken: Patient notified as instructed by telephone. Medication phoned to [facility] pharmacy as instructed. Lewanda Rife LPN  Dec 05, 4399 3:00 PM   New/Updated Medications: ULTRAM 50 MG TABS (TRAMADOL HCL) 1 by mouth up to three times a day as needed pain Prescriptions: ULTRAM 50 MG TABS (TRAMADOL HCL) 1 by mouth up to three times a day as needed pain  #30 x 1   Entered and Authorized by:   [name] MD   Signed by:   Lewanda Rife LPN on 02/72/5366   Method used:   Telephoned to ...       [facility]* (retail)       [address]       [place], [place]  [address]       Ph: [phone]       Fax: [phone]   RxID:   [id number]

## 2010-09-02 NOTE — Letter (Signed)
Summary: [facility]  [facility]   Imported By: Lanelle Bal 12/08/2009 10:38:54  _____________________________________________________________________  External Attachment:    Type:   Image     Comment:   External Document

## 2010-09-02 NOTE — Letter (Signed)
Summary: [facility]  [facility]   Imported By: Sherian Rein 04/23/2010 07:57:05  _____________________________________________________________________  External Attachment:    Type:   Image     Comment:   External Document

## 2010-09-02 NOTE — Progress Notes (Signed)
Summary: wants call   Phone Note Call from Patient Call back at [phone]   Caller: Patient Call For: [name] MD Summary of Call: Patient broke ankle on 7-27. She is now at [facility]. She says that she is having trouble with her sugar. She says that if it get over 350 they give her a insulin shot. Yesterday it was 424.  This morning it was 301. She says that she also is concerned because they haven't been giving her meds like she is supposed to be taking them. She couldn't remember all of the ones that they are giving her, but she does know that they aren't giving her the metoprolol and the furosimide. She said that she told the nurse that she is giving her all of her meds and the nurse told her that she isn't supposed to give her all of them. [name]' understand who made that decision.   She doesn't want them to make the decisions about her health, she feels that she is there only for her rehab. She is asking if you could call her.  Initial call taken by: Melody Comas,  March 08, 2010 9:13 AM  Follow-up for Phone Call        before I comment - please find out from [facility] if there is a doctor attending to her there that I can send info to -- thanks  Follow-up by: [name] MD,  March 08, 2010 9:45 AM  Additional Follow-up for Phone Call Additional follow up Details #1::        Spoke with Delsa Bern Nurse supervisor at [facility]. Delsa Bern said Dr. Allena Katz and Ms Chilton Si nurse practitioner are the facility physician and would take care of pt's med while at [facility]. Delsa Bern said she would talk with Ms Chilton Si nurse practitioner about pt's concern and if Dr Milinda Antis would like to speak with Dr Allena Katz or the nurse practioner then call [facility] at [phone] and let them know and they will have Dr. or Np contact Dr Milinda Antis.Lewanda Rife LPN  March 08, 2010 10:37 AM     Additional Follow-up for Phone Call Additional follow up Details #2::    please fax/ send them her current med list  from before hospital as well as allergy list , thanks  I will let them talk to pt and work it out  let me know if my asst is needed Follow-up by: [name] MD,  March 08, 2010 12:00 PM  Additional Follow-up for Phone Call Additional follow up Details #3:: Details for Additional Follow-up Action Taken: Patient notified as instructed by telephone. Delsa Bern at [facility] notified as instructed by telephone. Chart Summary with med list and allergies to meds faxed to [facility] attention at [phone].Lewanda Rife LPN  March 08, 2010 1:11 PM

## 2010-09-02 NOTE — Consult Note (Signed)
Summary: [facility]  [facility]   Imported By: Lanelle Bal 12/16/2009 11:43:58  _____________________________________________________________________  External Attachment:    Type:   Image     Comment:   External Document

## 2010-09-02 NOTE — Progress Notes (Signed)
Summary: wants hydrocodone  Phone Note Call from Patient Call back at Home Phone [phone] Message from:  Patient  Caller: Patient Summary of Call: Pt is requesting 750 mg's of hydrocodone for ankle pain.  She says she just got out of rehab yesterday.  They were giving her tramadol with tylenol at rehab, but that didnt help.  She says the pain is terrible.   Uses [facility]. Initial call taken by: Lowella Petties CMA, AAMA,  June 04, 2010 12:10 PM  Follow-up for Phone Call        she needs to call her orthopedic for this  Follow-up by: [name] MD,  June 04, 2010 1:07 PM  Additional Follow-up for Phone Call Additional follow up Details #1::        Patient notified as instructed by telephone. Lewanda Rife LPN  June 04, 2010 2:37 PM

## 2010-09-02 NOTE — Progress Notes (Signed)
Summary: protime results  Phone Note From Other Clinic   Caller: Cindy with [facility] [phone] Summary of Call: Home health nurse called to report pt's INR of 1.8, PT 21.6.  She is alternating 2.5  and 5 mg's.                 Lowella Petties CMA, AAMA  June 08, 2010 11:31 AM   Follow-up for Phone Call        give 5 mg today and 5 mg tomorrow then go back to alternating 2.5 and 5 re check 1 week PT/ INR please Follow-up by: [name] MD,  June 08, 2010 1:18 PM  Additional Follow-up for Phone Call Additional follow up Details #1::        Patient notified as instructed by telephone. Pt stated she understood how to take med. Cindy with [facility]  notified as instructed by telephone. Arline Asp will draw PT/INR in 1 week.Lewanda Rife LPN  June 08, 2010 3:19 PM

## 2010-09-02 NOTE — Letter (Signed)
Summary: [facility]  [facility]   Imported By: Maryln Gottron 06/17/2010 15:27:31  _____________________________________________________________________  External Attachment:    Type:   Image     Comment:   External Document

## 2010-09-02 NOTE — Progress Notes (Signed)
Summary: INR results  Phone Note From Other Clinic   Caller: Cindy with [facility] Summary of Call: Home health nurse called to report pt's INR of 2.9, PT 34.3.  This was checked with coag machine.  This is home health's last visit with pt.  She will be coming here for future checks. Initial call taken by: Lowella Petties CMA, AAMA,  June 15, 2010 2:28 PM  Follow-up for Phone Call        continue same dosing regimen and re check in 1 month here please  Follow-up by: [name] MD,  June 15, 2010 2:46 PM  Additional Follow-up for Phone Call Additional follow up Details #1::        Patient notified as instructed by telephone. Pt said she takes Coumadin 5mg  on Tues,Thur and Sat. The other days of the week she takes Coumadin 2.5mg . Pt did not want to schedule lab appt in 1 month. She said she would call back to make lab appt.Lewanda Rife LPN  June 15, 2010 3:42 PM

## 2010-09-02 NOTE — Letter (Signed)
Summary: [facility]  [facility]   Imported By: Lanelle Bal 08/25/2009 13:58:12  _____________________________________________________________________  External Attachment:    Type:   Image     Comment:   External Document

## 2010-09-02 NOTE — Miscellaneous (Signed)
Summary: Home Health Discharge Report/[facility]  Home Health Discharge Report/[facility]   Imported By: Maryln Gottron 07/12/2010 15:54:34  _____________________________________________________________________  External Attachment:    Type:   Image     Comment:   External Document

## 2010-09-02 NOTE — Progress Notes (Signed)
Summary: requests phone call to discuss lab work  Phone Note Call from Patient Call back at [employer] [phone]   Caller: Patient Call For: [name] MD Summary of Call: Pt had appt on 3/23 with you to discuss her lab work. She is asking if you can discuss this with her over the phone instead.  She said it will be difficult for her to come in because she is in so much arthritis pain. I offered to reschedule her appt for a later date but she said no.  Please advise.  ( Appt for 10/21/09 cancelled per pt's request.) Initial call taken by: Lowella Petties CMA,  October 19, 2009 9:53 AM  Follow-up for Phone Call        sugar control is a little improved -- and there is some protien in urine from her DM  that is what she needs to know for now  she has to come in for protimes-- this is very important -- so just make her f/u with me correlate with her protimes since she has to come in anyway Follow-up by: [name] MD,  October 19, 2009 11:16 PM  Additional Follow-up for Phone Call Additional follow up Details #1::        Patient notified as instructed by telephone. Pt will call for appt.Lewanda Rife LPN  October 20, 2009 1:09 PM

## 2010-09-02 NOTE — Assessment & Plan Note (Signed)
Summary: TEACH HUSBAND HOW TO GIVE SHOT/[name]  Nurse Visit   Allergies: 1)  Phenergan (Promethazine Hcl) 2)  Reglan (Metoclopramide Hcl) 3)  * Nsaids Group

## 2010-09-02 NOTE — Progress Notes (Signed)
Summary: stopped metformin  Phone Note Call from Patient Call back at Home Phone [phone]   Caller: Patient Call For: [name] MD Summary of Call: Pt wants you to know that she stopped the metformin and has not had any more diarrhea.  She started onglyza but she cant get her blood sugar to come down.  She says it runs over 200 in the mornings fasting.  She will need a refill on this, but she is asking if you want her to continue to take it.  If not, what do you want her to do.  [facility]. Initial call taken by: Lowella Petties CMA,  February 23, 2010 12:08 PM  Follow-up for Phone Call        we are likely going to have to start her on something daily injectible like lantus basal insulin  or byetta for better control - let me know if agreeable to starting something injectible  Follow-up by: [name] MD,  February 23, 2010 1:27 PM  Additional Follow-up for Phone Call Additional follow up Details #1::        Patient notified as instructed by telephone. Pt said she is not a needle person and would like to try another pill. Pt said when diagnosed with DM years ago was put on Micronase and that made her sugar drop too low and was controlled by diet for years. Can she try Micronase or something like that. Pt did say if no pill can be given she will do injectable.Please advise. Lewanda Rife LPN  February 23, 2010 3:59 PM    New Allergies: * ONGLYZA Additional Follow-up for Phone Call Additional follow up Details #2::    the micronase is really a weaker version of the med she is already on  some of the oral meds have side eff like heart failure/ swelling and I want to avoid these one more to try would be prandin -you take it 30 min before each meal stop the onglyza since it is not working try the prandin and then update me with how sugars are in 1-2 weeks Follow-up by: [name] MD,  February 23, 2010 4:42 PM  Additional Follow-up for Phone Call Additional follow up Details #3::  Details for Additional Follow-up Action Taken: Patient notified as instructed by telephone. Medication phoned to [facility] pharmacy as instructed. Lewanda Rife LPN  February 23, 2010 4:57 PM   New/Updated Medications: PRANDIN 2 MG TABS (REPAGLINIDE) 1 by mouth 30 minutes before meals three times a day New Allergies: * ONGLYZAPrescriptions: PRANDIN 2 MG TABS (REPAGLINIDE) 1 by mouth 30 minutes before meals three times a day  #90 x 5   Entered and Authorized by:   [name] MD   Signed by:   Lewanda Rife LPN on 86/57/8469   Method used:   Telephoned to ...       [facility]* (retail)       [address]       [place], [place]  [address]       Ph: [phone]       Fax: [phone]   RxID:   [id number]

## 2010-09-02 NOTE — Letter (Signed)
Summary: [facility]  [facility]   Imported By: Maryln Gottron 08/24/2010 12:31:10  _____________________________________________________________________  External Attachment:    Type:   Image     Comment:   External Document

## 2010-09-02 NOTE — Letter (Signed)
Summary: [facility]  [facility]   Imported By: Lanelle Bal 05/20/2010 08:10:29  _____________________________________________________________________  External Attachment:    Type:   Image     Comment:   External Document

## 2010-09-02 NOTE — Miscellaneous (Signed)
Summary: [facility] Plan/[facility]  Care Plan/[facility]   Imported By: Lanelle Bal 07/01/2010 13:32:52  _____________________________________________________________________  External Attachment:    Type:   Image     Comment:   External Document

## 2010-09-02 NOTE — Assessment & Plan Note (Signed)
Summary: [facility]   Vital Signs:  Patient profile:   77 year old female Height:      62 inches Temp:     97.9 degrees F oral Pulse rate:   76 / minute Pulse rhythm:   irregular BP sitting:   100 / 60  (left arm) Cuff size:   large  Vitals Entered By: Lewanda Rife LPN (July 12, 2010 3:44 PM)  CC: follow-up visit from [facility]   History of Present Illness: here for f/u of HTN and DM and lipids and vit D def / anemia / hypothyroidism  is s/p rehab stay after ankle fx and ORIF  did not weigh today  thinks her weight is pretty stable 243 at home today she had too much starch at the rehab facility -- they were not capable of diabetic diet  pain still present in her ankle - very slow improvement  orthopedic dr is Dr Dion Saucier   HTN in good control 100/60  DM - AIC 8.0 is now on lantus 40 units each pm -- she hates being on it  is supposed to take novolog 5 u if sugar is over 150  sugar stays under 150 most of the time  off metformin for diarrhea -- now her quality of life is better  eats a pc of fruit in evening  still not optimal diet - not much hunger  went to a diabetic class once in the past   is doing her rehab exercises  using walker and doing some walker  rev hosp/ rehab notes that we have   afib -- Dr Deborah Chalk  --needs appt  protime - is due for today  lipids are stable Last Lipid ProfileCholesterol: 122 (06/30/2010 11:25:47 AM)HDL:  35.20 (06/30/2010 11:25:47 AM)LDL:  66 (06/30/2010 11:25:47 AM)Triglycerides:  Last Liver profileSGOT:  23 (06/30/2010 11:25:47 AM)SPGT:  13 (06/30/2010 11:25:47 AM)T. Bili:  0.7 (06/30/2010 11:25:47 AM)Alk Phos:  54 (06/30/2010 11:25:47 AM)  thinks simvastain is causing muscle pain   vit D level is 17 this needs to be addressed  takes ca with vit D over the counter -- not taking them    hb is 11.3 --likely down from surgery is not on any iron pills   hypothyroid- stable and tx tsh   had 6 month  shot for OP when in rehab    Allergies: 1)  Phenergan (Promethazine Hcl) 2)  Reglan (Metoclopramide Hcl) 3)  * Nsaids Group 4)  * Onglyza  Past History:  Past Surgical History: Last updated: 03/03/2010 Hysterectomy 1963 Cholecysectomy 2006 L TKR  2001 R TKR 1993 foot fracture  Conization d+c 1963 Appendectomy 1941 Cystocele /rectocele 1978 Laser for vaginal dysplasia 1981 or 82 6/09 EGD gastric polyps 6/09 colonosc- adenomatous polyps- re check 6 mo (one partially removed -- needs f/u) 111/09 colonoscopy adenomatous polyp- re check 2y 7/11 fall with ankle fx and ORIF -- followed by rehab stay  Family History: Last updated: 05/26/2008 mother leukemia father CAD- atherosclerosis, dementia brother heart problems and diabetes brother died of MI at 60 sister died of COPD- smoking Family History of Colon Cancer: brother diagnosed age late 60's Family History of Kidney Disease: son  Social History: Last updated: 07/29/2008 Retired--office work [employer] Married-2nd  Has 3 sons she cares for her chronically sick husband  quit smoking 1950s Alcohol use-no  Has living will No prolonged artifial ventilation. No tube feeds if vegetative or unaware  Risk Factors: Smoking Status: never (01/12/2007)  Past Medical History: Atrial fibrillation Diabetes mellitus, type II Anemia- Fe deficiency GERD Hyperlipidemia Hypertension Hypothyroidism obesity  Vaginal dysplasia--1980 Rosacea Chest pain-- cardiac cath neg. 11/2002 Back pain IBS with diarrhea  asthma as a child  Adenomatous colon polyps 12/2007 Hemorrhoids vertigo  CONSULTANTS Dr Arna Snipe  [phone] Dr Olegario Messier Richardson--gyn Dr Rushie Goltz Dr Antonieta Loveless Dr Marygrace Drought Dr Russella Dar-- GI ortho -- Dr Sena Slate   Review of Systems General:  Denies fatigue, loss of appetite, and malaise. Eyes:  Denies blurring and eye irritation. CV:  Denies chest pain or discomfort, lightheadness, and  palpitations. Resp:  Denies cough, shortness of breath, and wheezing. GI:  Denies abdominal pain, bloody stools, change in bowel habits, and nausea. GU:  Denies dysuria and urinary frequency. MS:  Complains of joint pain, muscle aches, and stiffness; denies joint redness and joint swelling. Derm:  Denies itching, lesion(s), poor wound healing, and rash. Neuro:  Denies headaches, numbness, and tingling. Psych:  mood is fair . Endo:  Denies excessive thirst and excessive urination. Heme:  Denies abnormal bruising and bleeding.   Impression & Recommendations:  Problem # 1:  UNSPECIFIED VITAMIN D DEFICIENCY (ICD-268.9) Assessment New will start 12 week course of weekly ergocalciferol  re check at that time disc imp to bone health  Problem # 2:  COUMADIN THERAPY (ICD-V58.61) Assessment: Comment Only inr today  Problem # 3:  HYPOTHYROIDISM (ICD-244.9) Assessment: Unchanged  stable clinically and tx tsh  no change in dose Her updated medication list for this problem includes:    Levothyroxine Sodium 150 Mcg Tabs (Levothyroxine sodium) .Marland Kitchen... Take 1 tablet by mouth once a day  Labs Reviewed: TSH: 1.74 (06/30/2010)    HgBA1c: 8.0 (06/30/2010) Chol: 122 (06/30/2010)   HDL: 35.20 (06/30/2010)   LDL: 66 (06/30/2010)   TG: 105.0 (06/30/2010)  Problem # 4:  HYPERTENSION (ICD-401.9) Assessment: Unchanged  this is stable and well controlled lab and f/u 3 mo  The following medications were removed from the medication list:    Verapamil Hcl Cr 240 Mg Cp24 (Verapamil hcl) .Marland Kitchen... Take 1 tablet by mouth once a day    Furosemide 40 Mg Tabs (Furosemide) .Marland Kitchen... Take 1 tablet by mouth once a day Her updated medication list for this problem includes:    Indapamide 2.5 Mg Tabs (Indapamide) .Marland Kitchen... Take 1 tablet by mouth once daily    Metoprolol Tartrate 50 Mg Tabs (Metoprolol tartrate) .Marland Kitchen... Take 1/2 by month two times a day    Furosemide 20 Mg Tabs (Furosemide) .Marland Kitchen... Take 1 tablet by mouth once a  day  BP today: 100/60 Prior BP: 118/70 (01/13/2010)  Labs Reviewed: K+: 4.4 (06/30/2010) Creat: : 1.2 (06/30/2010)   Chol: 122 (06/30/2010)   HDL: 35.20 (06/30/2010)   LDL: 66 (06/30/2010)   TG: 105.0 (06/30/2010)  Problem # 5:  HYPERLIPIDEMIA (ICD-272.4) Assessment: Unchanged  pt will hold simvastatin for ? muscle pain  waiting for price to come down on lipitor  lab and f/u 3 mo Her updated medication list for this problem includes:    Simvastatin 40 Mg Tabs (Simvastatin) .Marland Kitchen... Take 1 tablet by mouth once a day (holding for muscle pain)  Labs Reviewed: SGOT: 23 (06/30/2010)   SGPT: 13 (06/30/2010)   HDL:35.20 (06/30/2010), 33.40 (07/15/2009)  LDL:66 (06/30/2010), 52 (07/15/2009)  Chol:122 (06/30/2010), 113 (07/15/2009)  Trig:105.0 (06/30/2010), 136.0 (07/15/2009)  Problem # 6:  DIABETES MELLITUS, TYPE II (ICD-250.00) Assessment: Unchanged  this is worse explicit inst to check sugar once daily - some am and some pm inc lantus to  50 units stop novolog- too confusing pt refuses to change diet  lab and f/u 3 mo and rev glucose log The following medications were removed from the medication list:    Glucotrol 10 Mg Tabs (Glipizide) .Marland Kitchen... Take 1 tablet by mouth two times a day    Metformin Hcl 1000 Mg Tabs (Metformin hcl) .Marland Kitchen... Take one by mouth two times a day    Ecotrin 325 Mg Tbec (Aspirin) .Marland Kitchen... Take 1 tablet by mouth once a day    Prandin 2 Mg Tabs (Repaglinide) .Marland Kitchen... 1 by mouth 30 minutes before meals three times a day Her updated medication list for this problem includes:    Lantus Solostar 100 Unit/ml Soln (Insulin glargine) ..... Inject 50 units as directed each evening  Orders: Prescription Created Electronically [id number])  Problem # 7:  ATRIAL FIBRILLATION (ICD-427.31) Assessment: Unchanged overdue cardiol f/u  will schedule The following medications were removed from the medication list:    Verapamil Hcl Cr 240 Mg Cp24 (Verapamil hcl) .Marland Kitchen... Take 1 tablet by mouth  once a day    Ecotrin 325 Mg Tbec (Aspirin) .Marland Kitchen... Take 1 tablet by mouth once a day Her updated medication list for this problem includes:    Coumadin 5 Mg Tabs (Warfarin sodium) .Marland Kitchen... Take by mouth as directed    Metoprolol Tartrate 50 Mg Tabs (Metoprolol tartrate) .Marland Kitchen... Take 1/2 by month two times a day  Orders: Protime ([id number]) Cardiology Referral (Cardiology)  Complete Medication List: 1)  Levothyroxine Sodium 150 Mcg Tabs (Levothyroxine sodium) .... Take 1 tablet by mouth once a day 2)  Indapamide 2.5 Mg Tabs (Indapamide) .... Take 1 tablet by mouth once daily 3)  Omeprazole 20 Mg Cpdr (Omeprazole) .... Take 1 tablet by mouth once a day 4)  Simvastatin 40 Mg Tabs (Simvastatin) .... Take 1 tablet by mouth once a day (holding for muscle pain) 5)  Coumadin 5 Mg Tabs (Warfarin sodium) .... Take by mouth as directed 6)  Potassium Chloride Crys Cr 20 Meq Cr-tabs (Potassium chloride crys cr) .... Take one by mouth two times a day 7)  Metoprolol Tartrate 50 Mg Tabs (Metoprolol tartrate) .... Take 1/2 by month two times a day 8)  Onetouch Ultra Test Strp (Glucose blood) .... Check blood sugar four times a day 9)  Lantus Solostar 100 Unit/ml Soln (Insulin glargine) .... Inject 50 units as directed each evening 10)  Bd Pen Needle Ultrafine 29g X 12.37mm Misc (Insulin pen needle) .... Use as directed with insulin injections 11)  Furosemide 20 Mg Tabs (Furosemide) .... Take 1 tablet by mouth once a day 12)  Hydrocodone-acetaminophen 7.5-750 Mg Tabs (Hydrocodone-acetaminophen) .... One tabley by mouth every 6 hours as needed for pain. 13)  Methocarbamol ?mg (given By Dr Priscille Kluver  14)  Over The Counter Iron 325 Mg  .Marland KitchenMarland Kitchen. 1 by mouth once daily 15)  Ergocalciferol 50000 Unit Caps (Ergocalciferol) .Marland Kitchen.. 1 by mouth once weekly for 12 weeks  Patient Instructions: 1)  your vitamin D level is low -- please take the vitamin D high dose weekly for 12 weeks (ergocalciferol) 2)  schedule lab draw in 12 weeks  for vit D level/ AIC/ renal profile/ lipids/ast/alt 272, 250.0, vit D def  3)  then follow up with me  4)  stop the simvastatin if you think that it is making your muscles ache  5)  you can take an over the counter iron pill (325 mg ferrous sulfate)  once daily -- this should help energy level- you are  anemic post surgery  6)  increase your lantus insulin from 40 to 50 units once daily 7)  stop the novolg  8)  on mon/ wed and fri please check fasting glucose in ams  9)  on tues/ thursday / sat/ sunday check sugar 2 hours after pm meal  10)  keep a log and bring to your next visit in 3 months  11)  protime today  12)  we will do cardiology referral at check out  Prescriptions: ERGOCALCIFEROL 50000 UNIT CAPS (ERGOCALCIFEROL) 1 by mouth once weekly for 12 weeks  #12 x 0   Entered and Authorized by:   Marne Ann Tower MD   Signed by:   Marne Ann Tower MD on 07/12/2010   Method used:   Electronically to        [facility]* (retail)       [address]       [place], [place]  [address]       Ph: [phone]       Fax: [phone]   RxID:   [id number] LANTUS SOLOSTAR 100 UNIT/ML SOLN (INSULIN GLARGINE) inject 50 units as directed each evening  #1 month x 11   Entered and Authorized by:   Marne Ann Tower MD   Signed by:   Marne Ann Tower MD on 07/12/2010   Method used:   Electronically to        [facility]* (retail)       [address]       [place], [place]  [address]       Ph: [phone]       Fax: [phone]   RxID:   [id number]    Orders Added: 1)  Protime [[id number]] 2)  Cardiology Referral [Cardiology] 3)  Prescription Created Electronically [[id number]] 4)  Est. Patient Level V [53664]    Current Allergies (reviewed today): PHENERGAN (PROMETHAZINE HCL) REGLAN (METOCLOPRAMIDE HCL) * NSAIDS GROUP [facility] Laboratory Results   Blood Tests   Date/Time Recieved: July 12, 2010 4:46 PM  Date/Time Reported: July 12, 2010 4:46 PM   PT: 46.6 s    (Normal Range: 10.6-13.4)  INR: 3.9   (Normal Range: 0.88-1.12   Therap INR: 2.0-3.5)      ANTICOAGULATION RECORD PREVIOUS REGIMEN & LAB RESULTS Anticoagulation Diagnosis:  Atrial fibrillation on  07/29/2008 Previous INR Goal Range:  2.0-3.0 on  07/29/2008 Previous INR:  2.8 on  02/25/2010 Previous Coumadin Dose(mg):  2.5mg  qd, 5mg  T,TH on  11/17/2009 Previous Regimen:  2.5mg , 5mg  T,TH,Sat on  02/11/2010 Previous Coagulation Comments:  . on  02/11/2010  NEW REGIMEN & LAB RESULTS Current INR: 3.9 Regimen: 2.5mg  qd, 5mg  t, th  Provider: tower      Repeat testing in: 2 weeks MEDICATIONS LEVOTHYROXINE SODIUM 150 MCG  TABS (LEVOTHYROXINE SODIUM) Take 1 tablet by mouth once a day INDAPAMIDE 2.5 MG  TABS (INDAPAMIDE) Take 1 tablet by mouth once daily OMEPRAZOLE 20 MG  CPDR (OMEPRAZOLE) Take 1 tablet by mouth once a day SIMVASTATIN 40 MG  TABS (SIMVASTATIN) Take 1 tablet by mouth once a day (HOLDING FOR MUSCLE PAIN) COUMADIN 5 MG  TABS (WARFARIN SODIUM) take by mouth as directed POTASSIUM CHLORIDE CRYS CR 20 MEQ CR-TABS (POTASSIUM CHLORIDE CRYS CR) take one by mouth two times a day METOPROLOL TARTRATE 50 MG  TABS (METOPROLOL TARTRATE) Take 1/2 by month two times a day ONETOUCH ULTRA TEST  STRP (GLUCOSE BLOOD) Check blood sugar four times a day LANTUS SOLOSTAR 100 UNIT/ML SOLN (INSULIN  GLARGINE) inject 50 units as directed each evening BD PEN NEEDLE ULTRAFINE 29G X 12.7MM MISC (INSULIN PEN NEEDLE) use as directed with insulin injections FUROSEMIDE 20 MG TABS (FUROSEMIDE) Take 1 tablet by mouth once a day HYDROCODONE-ACETAMINOPHEN 7.5-750 MG TABS (HYDROCODONE-ACETAMINOPHEN) one tabley by mouth every 6 hours as needed for pain. * METHOCARBAMOL ?MG (GIVEN BY DR RENDALL  * OVER THE COUNTER IRON 325 MG 1 by mouth once daily ERGOCALCIFEROL 50000 UNIT CAPS (ERGOCALCIFEROL) 1 by mouth once weekly for 12 weeks  Dose has been reviewed with patient or caretaker during this visit.  Reviewed by:  Allison Quarry  Anticoagulation Visit Questionnaire      Coumadin dose missed/changed:  No      Abnormal Bleeding Symptoms:  No Any diet changes including alcohol intake, vegetables or greens since the last visit:  No Any illnesses or hospitalizations since the last visit:  No Any signs of clotting since the last visit (including chest discomfort, dizziness, shortness of breath, arm tingling, slurred speech, swelling or redness in leg):  No

## 2010-09-02 NOTE — Progress Notes (Signed)
Summary: needs refill on lantus  Phone Note Refill Request Message from:  Patient  Refills Requested: Medication #1:  Lantus solostar This is not on medlist.  Pt was given script at her discharge from [facility], where she went for rehab.  She needs a refill before her visit with Dr. Milinda Antis later this month.  Uses 40 units at bedtime.  Please send to [facility].  Initial call taken by: Lowella Petties CMA, AAMA,  July 05, 2010 12:52 PM Caller: Patient  Follow-up for Phone Call        put in chart.  please let patient know. Follow-up by: Eustaquio Boyden  MD,  July 05, 2010 12:53 PM  Additional Follow-up for Phone Call Additional follow up Details #1::        Advised pt. Additional Follow-up by: Lowella Petties CMA, AAMA,  July 05, 2010 2:44 PM    New/Updated Medications: LANTUS SOLOSTAR 100 UNIT/ML SOLN (INSULIN GLARGINE) inject 40 units QPM, qs 1 month Prescriptions: LANTUS SOLOSTAR 100 UNIT/ML SOLN (INSULIN GLARGINE) inject 40 units QPM, qs 1 month  #1 x 1   Entered and Authorized by:   Eustaquio Boyden  MD   Signed by:   Eustaquio Boyden  MD on 07/05/2010   Method used:   Electronically to        [facility]* (retail)       [address]       [place], [place]  [address]       Ph: [phone]       Fax: [phone]   RxID:   [id number]

## 2010-09-02 NOTE — Letter (Signed)
Summary: [facility]  [facility]   Imported By: Lanelle Bal 12/08/2009 10:33:04  _____________________________________________________________________  External Attachment:    Type:   Image     Comment:   External Document

## 2010-09-02 NOTE — Miscellaneous (Signed)
Summary: Orders/[facility]  [facility]ders/[facility]   Imported By: Lester Liberty 06/23/2010 13:34:36  _____________________________________________________________________  External Attachment:    Type:   Image     Comment:   External Document

## 2010-09-02 NOTE — Progress Notes (Signed)
Summary: regarding possible drug interaction  Phone Note From Pharmacy   [facility] Summary of Call: Letter regarding possible drug interaction between verapamil and simvastatin is on  your shelf. Initial call taken by: Lowella Petties CMA,  October 19, 2009 1:32 PM

## 2010-09-02 NOTE — Letter (Signed)
Summary: [facility]  [facility]   Imported By: Lanelle Bal 02/10/2010 12:41:49  _____________________________________________________________________  External Attachment:    Type:   Image     Comment:   External Document

## 2010-09-02 NOTE — Progress Notes (Signed)
Summary: immodium  Phone Note Call from Patient Call back at Home Phone [phone]   Caller: Patient Call For: [name] MD Summary of Call: Patient is asking if she can take some OTC immodium. She has been having a  terrilble time with diarrhea this week. Had a couple of accidents on her clothing. Pease advise.   Initial call taken by: Melody Comas,  Dec 25, 2009 10:17 AM  Follow-up for Phone Call        if immodium has worked for her in past - is ok to take for a few days  let me know if no improvement Follow-up by: [name] MD,  Dec 25, 2009 11:21 AM  Additional Follow-up for Phone Call Additional follow up Details #1::        Patient notified as instructed by telephone. Lewanda Rife LPN  Dec 25, 2009 1:12 PM     New/Updated Medications: IMODIUM A-D 2 MG TABS (LOPERAMIDE HCL) as needed

## 2010-09-13 ENCOUNTER — Ambulatory Visit (HOSPITAL_COMMUNITY): Payer: Medicare Other | Attending: Cardiology

## 2010-09-13 ENCOUNTER — Encounter: Payer: Self-pay | Admitting: Family Medicine

## 2010-09-13 DIAGNOSIS — E785 Hyperlipidemia, unspecified: Secondary | ICD-10-CM | POA: Insufficient documentation

## 2010-09-13 DIAGNOSIS — I4891 Unspecified atrial fibrillation: Secondary | ICD-10-CM | POA: Insufficient documentation

## 2010-09-13 DIAGNOSIS — R609 Edema, unspecified: Secondary | ICD-10-CM | POA: Insufficient documentation

## 2010-09-13 DIAGNOSIS — I10 Essential (primary) hypertension: Secondary | ICD-10-CM | POA: Insufficient documentation

## 2010-09-13 DIAGNOSIS — E119 Type 2 diabetes mellitus without complications: Secondary | ICD-10-CM | POA: Insufficient documentation

## 2010-09-13 DIAGNOSIS — I079 Rheumatic tricuspid valve disease, unspecified: Secondary | ICD-10-CM | POA: Insufficient documentation

## 2010-09-13 DIAGNOSIS — M7989 Other specified soft tissue disorders: Secondary | ICD-10-CM | POA: Insufficient documentation

## 2010-09-22 ENCOUNTER — Encounter (INDEPENDENT_AMBULATORY_CARE_PROVIDER_SITE_OTHER): Payer: Self-pay | Admitting: *Deleted

## 2010-09-22 ENCOUNTER — Ambulatory Visit (INDEPENDENT_AMBULATORY_CARE_PROVIDER_SITE_OTHER): Payer: MEDICARE

## 2010-09-22 ENCOUNTER — Other Ambulatory Visit (INDEPENDENT_AMBULATORY_CARE_PROVIDER_SITE_OTHER): Payer: MEDICARE

## 2010-09-22 ENCOUNTER — Encounter: Payer: Self-pay | Admitting: Family Medicine

## 2010-09-22 ENCOUNTER — Other Ambulatory Visit: Payer: Self-pay | Admitting: Family Medicine

## 2010-09-22 DIAGNOSIS — E039 Hypothyroidism, unspecified: Secondary | ICD-10-CM

## 2010-09-22 DIAGNOSIS — Z5181 Encounter for therapeutic drug level monitoring: Secondary | ICD-10-CM

## 2010-09-22 DIAGNOSIS — E785 Hyperlipidemia, unspecified: Secondary | ICD-10-CM

## 2010-09-22 DIAGNOSIS — I4891 Unspecified atrial fibrillation: Secondary | ICD-10-CM

## 2010-09-22 DIAGNOSIS — Z7901 Long term (current) use of anticoagulants: Secondary | ICD-10-CM

## 2010-09-22 DIAGNOSIS — E119 Type 2 diabetes mellitus without complications: Secondary | ICD-10-CM

## 2010-09-22 DIAGNOSIS — E559 Vitamin D deficiency, unspecified: Secondary | ICD-10-CM

## 2010-09-22 DIAGNOSIS — I10 Essential (primary) hypertension: Secondary | ICD-10-CM

## 2010-09-22 LAB — RENAL FUNCTION PANEL
BUN: 29 mg/dL — ABNORMAL HIGH (ref 6–23)
CO2: 31 mEq/L (ref 19–32)
Chloride: 104 mEq/L (ref 96–112)
Creatinine, Ser: 1.2 mg/dL (ref 0.4–1.2)
GFR: 46.65 mL/min — ABNORMAL LOW (ref >60.00)
Phosphorus: 3.9 mg/dL (ref 2.3–4.6)
Sodium: 142 mEq/L (ref 135–145)

## 2010-09-22 LAB — AST: AST: 22 U/L (ref 0–37)

## 2010-09-22 LAB — CONVERTED CEMR LAB
INR: 2.6
Prothrombin Time: 31.8 s

## 2010-09-22 LAB — ALT: ALT: 17 U/L (ref 0–35)

## 2010-09-22 LAB — HEMOGLOBIN A1C: Hgb A1c MFr Bld: 8.2 % — ABNORMAL HIGH (ref 4.6–6.5)

## 2010-09-22 LAB — LDL CHOLESTEROL, DIRECT: Direct LDL: 160.8 mg/dL

## 2010-09-23 LAB — CONVERTED CEMR LAB: Vit D, 25-Hydroxy: 24 ng/mL — ABNORMAL LOW (ref 30–89)

## 2010-09-28 NOTE — Medication Information (Signed)
Summary: protime/tw   PCP: [name], MD Indication 1: Atrial fibrillation PT 31.8 INR RANGE 2.0-3.0           Allergies: 1)  Phenergan (Promethazine Hcl) 2)  Reglan (Metoclopramide Hcl) 3)  * Nsaids Group 4)  * Onglyza  Anticoagulation Management History:      Positive risk factors for bleeding include an age of 75 years or older and presence of serious comorbidities.  The bleeding index is 'intermediate risk'.  Positive CHADS2 values include History of HTN, Age > 42 years old, and History of Diabetes.  Her last INR was 2.9 and today's INR is 2.6.  Prothrombin time is 31.8.    Anticoagulation Management Assessment/Plan:      The patient's current anticoagulation dose is Coumadin 5 mg  tabs: take by mouth as directed.  The next INR is due 4 weeks.        Laboratory Results   Blood Tests   Date/Time Recieved: September 22, 2010 11:43 AM  Date/Time Reported: September 22, 2010 11:43 AM   PT: 31.8 s   (Normal Range: 10.6-13.4)  INR: 2.6   (Normal Range: 0.88-1.12   Therap INR: 2.0-3.5)      ANTICOAGULATION RECORD PREVIOUS REGIMEN & LAB RESULTS Anticoagulation Diagnosis:  Atrial fibrillation on  07/29/2008 Previous INR Goal Range:  2.0-3.0 on  07/29/2008 Previous INR:  2.9 on  08/25/2010 Previous Coumadin Dose(mg):  2.5mg  qd, 5mg  T,TH on  11/17/2009 Previous Regimen:  2.5mg  qd, 5mg  t, th on  07/12/2010 Previous Coagulation Comments:  . on  02/11/2010  NEW REGIMEN & LAB RESULTS Current INR: 2.6 Regimen: 2.5mg  qd, 5mg  T,TH,SAT Coagulation Comments: this is the dose patient has been taking. Provider: tower      Repeat testing in: 4 weeks MEDICATIONS LEVOTHYROXINE SODIUM 150 MCG  TABS (LEVOTHYROXINE SODIUM) Take 1 tablet by mouth once a day INDAPAMIDE 2.5 MG  TABS (INDAPAMIDE) Take 1 tablet by mouth once daily OMEPRAZOLE 20 MG  CPDR (OMEPRAZOLE) Take 1 tablet by mouth once a day SIMVASTATIN 40 MG  TABS (SIMVASTATIN) Take 1 tablet by mouth once a day (HOLDING  FOR MUSCLE PAIN) COUMADIN 5 MG  TABS (WARFARIN SODIUM) take by mouth as directed POTASSIUM CHLORIDE CRYS CR 20 MEQ CR-TABS (POTASSIUM CHLORIDE CRYS CR) take one by mouth two times a day METOPROLOL TARTRATE 50 MG  TABS (METOPROLOL TARTRATE) Take 1/2 by month two times a day ONETOUCH ULTRA TEST  STRP (GLUCOSE BLOOD) Check blood sugar four times a day LANTUS SOLOSTAR 100 UNIT/ML SOLN (INSULIN GLARGINE) inject 50 units as directed each evening BD PEN NEEDLE ULTRAFINE 29G X 12.7MM MISC (INSULIN PEN NEEDLE) use as directed with insulin injections FUROSEMIDE 20 MG TABS (FUROSEMIDE) Take 1 tablet by mouth once a day HYDROCODONE-ACETAMINOPHEN 7.5-750 MG TABS (HYDROCODONE-ACETAMINOPHEN) one tabley by mouth every 6 hours as needed for pain. * METHOCARBAMOL ?MG (GIVEN BY DR RENDALL  * OVER THE COUNTER IRON 325 MG 1 by mouth once daily ERGOCALCIFEROL 50000 UNIT CAPS (ERGOCALCIFEROL) 1 by mouth once weekly for 12 weeks  Dose has been reviewed with patient or caretaker during this visit.  Reviewed by: Allison Quarry  Anticoagulation Visit Questionnaire      Coumadin dose missed/changed:  No      Abnormal Bleeding Symptoms:  No Any diet changes including alcohol intake, vegetables or greens since the last visit:  No Any illnesses or hospitalizations since the last visit:  No Any signs of clotting since the last visit (including chest discomfort, dizziness, shortness of  breath, arm tingling, slurred speech, swelling or redness in leg):  No

## 2010-09-30 ENCOUNTER — Other Ambulatory Visit: Payer: Self-pay

## 2010-10-05 ENCOUNTER — Ambulatory Visit (INDEPENDENT_AMBULATORY_CARE_PROVIDER_SITE_OTHER): Payer: MEDICARE | Admitting: Family Medicine

## 2010-10-05 ENCOUNTER — Ambulatory Visit: Payer: Self-pay | Admitting: Family Medicine

## 2010-10-05 ENCOUNTER — Encounter: Payer: Self-pay | Admitting: Family Medicine

## 2010-10-05 DIAGNOSIS — I10 Essential (primary) hypertension: Secondary | ICD-10-CM

## 2010-10-05 DIAGNOSIS — E785 Hyperlipidemia, unspecified: Secondary | ICD-10-CM

## 2010-10-05 DIAGNOSIS — E119 Type 2 diabetes mellitus without complications: Secondary | ICD-10-CM

## 2010-10-05 DIAGNOSIS — E559 Vitamin D deficiency, unspecified: Secondary | ICD-10-CM

## 2010-10-05 LAB — HM DIABETES FOOT EXAM

## 2010-10-15 LAB — GLUCOSE, CAPILLARY
Glucose-Capillary: 114 mg/dL — ABNORMAL HIGH (ref 70–99)
Glucose-Capillary: 161 mg/dL — ABNORMAL HIGH (ref 70–99)
Glucose-Capillary: 184 mg/dL — ABNORMAL HIGH (ref 70–99)
Glucose-Capillary: 189 mg/dL — ABNORMAL HIGH (ref 70–99)
Glucose-Capillary: 213 mg/dL — ABNORMAL HIGH (ref 70–99)
Glucose-Capillary: 219 mg/dL — ABNORMAL HIGH (ref 70–99)
Glucose-Capillary: 224 mg/dL — ABNORMAL HIGH (ref 70–99)
Glucose-Capillary: 264 mg/dL — ABNORMAL HIGH (ref 70–99)

## 2010-10-15 LAB — CBC
HCT: 26.4 % — ABNORMAL LOW (ref 36.0–46.0)
Hemoglobin: 8.8 g/dL — ABNORMAL LOW (ref 12.0–15.0)
MCHC: 33.2 g/dL (ref 30.0–36.0)
MCV: 92 fL (ref 78.0–100.0)
Platelets: 221 10*3/uL (ref 150–400)
RBC: 2.85 MIL/uL — ABNORMAL LOW (ref 3.87–5.11)
RBC: 3.12 MIL/uL — ABNORMAL LOW (ref 3.87–5.11)
RDW: 14.4 % (ref 11.5–15.5)
WBC: 10.5 10*3/uL (ref 4.0–10.5)
WBC: 8.1 10*3/uL (ref 4.0–10.5)

## 2010-10-15 LAB — BASIC METABOLIC PANEL
Calcium: 8.3 mg/dL — ABNORMAL LOW (ref 8.4–10.5)
Chloride: 98 mEq/L (ref 96–112)
Creatinine, Ser: 0.82 mg/dL (ref 0.4–1.2)
GFR calc Af Amer: 53 mL/min — ABNORMAL LOW (ref >60)
GFR calc Af Amer: >60 mL/min (ref >60)
GFR calc non Af Amer: 44 mL/min — ABNORMAL LOW (ref >60)
GFR calc non Af Amer: >60 mL/min (ref >60)
Potassium: 3.8 mEq/L (ref 3.5–5.1)
Sodium: 140 mEq/L (ref 135–145)

## 2010-10-15 LAB — PROTIME-INR
INR: 1.36 (ref 0.00–1.49)
INR: 1.62 — ABNORMAL HIGH (ref 0.00–1.49)
INR: 1.79 — ABNORMAL HIGH (ref 0.00–1.49)
Prothrombin Time: 16.7 seconds — ABNORMAL HIGH (ref 11.6–15.2)
Prothrombin Time: 19.1 seconds — ABNORMAL HIGH (ref 11.6–15.2)
Prothrombin Time: 21 seconds — ABNORMAL HIGH (ref 11.6–15.2)

## 2010-10-16 LAB — CBC
HCT: 27.6 % — ABNORMAL LOW (ref 36.0–46.0)
HCT: 28.9 % — ABNORMAL LOW (ref 36.0–46.0)
HCT: 31.9 % — ABNORMAL LOW (ref 36.0–46.0)
Hemoglobin: 10.7 g/dL — ABNORMAL LOW (ref 12.0–15.0)
Hemoglobin: 9.6 g/dL — ABNORMAL LOW (ref 12.0–15.0)
MCH: 30.2 pg (ref 26.0–34.0)
MCH: 31.8 pg (ref 26.0–34.0)
MCHC: 33.2 g/dL (ref 30.0–36.0)
MCHC: 33.7 g/dL (ref 30.0–36.0)
MCV: 90.4 fL (ref 78.0–100.0)
MCV: 91 fL (ref 78.0–100.0)
MCV: 94.6 fL (ref 78.0–100.0)
RBC: 3.17 MIL/uL — ABNORMAL LOW (ref 3.87–5.11)
RDW: 14.4 % (ref 11.5–15.5)
WBC: 11.6 10*3/uL — ABNORMAL HIGH (ref 4.0–10.5)

## 2010-10-16 LAB — PROTIME-INR
INR: 2.4 — ABNORMAL HIGH (ref 0.00–1.49)
Prothrombin Time: 26 seconds — ABNORMAL HIGH (ref 11.6–15.2)

## 2010-10-16 LAB — DIFFERENTIAL
Basophils Absolute: 0 10*3/uL (ref 0.0–0.1)
Basophils Relative: 0 % (ref 0–1)
Basophils Relative: 1 % (ref 0–1)
Eosinophils Absolute: 0.1 10*3/uL (ref 0.0–0.7)
Eosinophils Absolute: 0.1 10*3/uL (ref 0.0–0.7)
Eosinophils Relative: 1 % (ref 0–5)
Lymphs Abs: 1.4 10*3/uL (ref 0.7–4.0)
Monocytes Absolute: 0.9 10*3/uL (ref 0.1–1.0)
Monocytes Relative: 10 % (ref 3–12)
Neutro Abs: 9.7 10*3/uL — ABNORMAL HIGH (ref 1.7–7.7)
Neutrophils Relative %: 83 % — ABNORMAL HIGH (ref 43–77)

## 2010-10-16 LAB — BASIC METABOLIC PANEL
BUN: 10 mg/dL (ref 6–23)
BUN: 12 mg/dL (ref 6–23)
BUN: 14 mg/dL (ref 6–23)
CO2: 27 mEq/L (ref 19–32)
Calcium: 8.1 mg/dL — ABNORMAL LOW (ref 8.4–10.5)
Chloride: 100 mEq/L (ref 96–112)
Chloride: 106 mEq/L (ref 96–112)
Chloride: 98 mEq/L (ref 96–112)
Creatinine, Ser: 0.85 mg/dL (ref 0.4–1.2)
Creatinine, Ser: 0.91 mg/dL (ref 0.4–1.2)
Creatinine, Ser: 0.94 mg/dL (ref 0.4–1.2)
GFR calc non Af Amer: 57 mL/min — ABNORMAL LOW (ref >60)
Glucose, Bld: 147 mg/dL — ABNORMAL HIGH (ref 70–99)
Glucose, Bld: 169 mg/dL — ABNORMAL HIGH (ref 70–99)
Glucose, Bld: 258 mg/dL — ABNORMAL HIGH (ref 70–99)
Glucose, Bld: 269 mg/dL — ABNORMAL HIGH (ref 70–99)
Potassium: 3.3 mEq/L — ABNORMAL LOW (ref 3.5–5.1)
Potassium: 3.4 mEq/L — ABNORMAL LOW (ref 3.5–5.1)
Potassium: 3.6 mEq/L (ref 3.5–5.1)
Potassium: 3.7 mEq/L (ref 3.5–5.1)

## 2010-10-16 LAB — GLUCOSE, CAPILLARY
Glucose-Capillary: 141 mg/dL — ABNORMAL HIGH (ref 70–99)
Glucose-Capillary: 187 mg/dL — ABNORMAL HIGH (ref 70–99)
Glucose-Capillary: 203 mg/dL — ABNORMAL HIGH (ref 70–99)
Glucose-Capillary: 229 mg/dL — ABNORMAL HIGH (ref 70–99)
Glucose-Capillary: 244 mg/dL — ABNORMAL HIGH (ref 70–99)
Glucose-Capillary: 245 mg/dL — ABNORMAL HIGH (ref 70–99)
Glucose-Capillary: 248 mg/dL — ABNORMAL HIGH (ref 70–99)
Glucose-Capillary: 262 mg/dL — ABNORMAL HIGH (ref 70–99)
Glucose-Capillary: 268 mg/dL — ABNORMAL HIGH (ref 70–99)

## 2010-10-16 LAB — URINALYSIS, ROUTINE W REFLEX MICROSCOPIC
Bilirubin Urine: NEGATIVE
Hgb urine dipstick: NEGATIVE
Ketones, ur: NEGATIVE mg/dL
Protein, ur: NEGATIVE mg/dL
Specific Gravity, Urine: 1.011 (ref 1.005–1.030)
Urobilinogen, UA: 0.2 mg/dL (ref 0.0–1.0)

## 2010-10-16 LAB — COMPREHENSIVE METABOLIC PANEL
ALT: 16 U/L (ref 0–35)
Alkaline Phosphatase: 44 U/L (ref 39–117)
BUN: 18 mg/dL (ref 6–23)
CO2: 29 mEq/L (ref 19–32)
GFR calc non Af Amer: 47 mL/min — ABNORMAL LOW (ref >60)
Glucose, Bld: 276 mg/dL — ABNORMAL HIGH (ref 70–99)
Potassium: 3.6 mEq/L (ref 3.5–5.1)
Sodium: 141 mEq/L (ref 135–145)
Total Bilirubin: 0.5 mg/dL (ref 0.3–1.2)

## 2010-10-16 LAB — URINE CULTURE

## 2010-10-16 LAB — HEMOGLOBIN A1C: Mean Plasma Glucose: 203 mg/dL — ABNORMAL HIGH (ref <117)

## 2010-10-16 LAB — URINE MICROSCOPIC-ADD ON

## 2010-10-18 LAB — POCT I-STAT, CHEM 8
Calcium, Ion: 1.09 mmol/L — ABNORMAL LOW (ref 1.12–1.32)
Calcium, Ion: 1.1 mmol/L — ABNORMAL LOW (ref 1.12–1.32)
Chloride: 102 mEq/L (ref 96–112)
Creatinine, Ser: 1.1 mg/dL (ref 0.4–1.2)
Glucose, Bld: 123 mg/dL — ABNORMAL HIGH (ref 70–99)
Glucose, Bld: 125 mg/dL — ABNORMAL HIGH (ref 70–99)
HCT: 40 % (ref 36.0–46.0)
Hemoglobin: 13.6 g/dL (ref 12.0–15.0)
Potassium: 4.2 mEq/L (ref 3.5–5.1)
TCO2: 31 mmol/L (ref 0–100)

## 2010-10-19 NOTE — Assessment & Plan Note (Signed)
Summary: 12 wk follow up after labs//lfw   Vital Signs:  Patient profile:   77 year old female Height:      62 inches Weight:      248.50 pounds BMI:     45.62 Temp:     97.4 degrees F oral Pulse rate:   84 / minute Pulse rhythm:   irregular BP sitting:   108 / 72  (left arm) Cuff size:   large  Vitals Entered By: Lewanda Rife LPN (October 05, 4130 2:43 PM) CC: 12 week f/u after labs   History of Present Illness: here for f/u of lipids and DM and HTN and vit D def  is not doing well -- chronic pain issues -- same old stuff  sees Dr Pollie Meyer -- ? rotator cuff tear  ankle problems and knee pain - all over  is on her pain med and muscle relaxer and occ tylenol   after 12 wk course of vit D high dose wkly- level is 24-- that is up from 17    108/72--- continued good bp control   DM slt worse -- AIC is up from 8 to 8.2 sugars good in am - low 100s and pms are usually 160s to low 200s eats brunch at 11 -1 and then small meal in evening  on lantus 50 intol metformin  LDL up to 160 off meds  simvastatin gave muscle pain   is sad  - Dr Deborah Chalk is going to retire  will have to choose another cardiologist    Allergies: 1)  Phenergan (Promethazine Hcl) 2)  Reglan (Metoclopramide Hcl) 3)  * Nsaids Group 4)  * Onglyza 5)  Simvastatin  Past History:  Past Medical History: Last updated: 07/12/2010 Atrial fibrillation Diabetes mellitus, type II Anemia- Fe deficiency GERD Hyperlipidemia Hypertension Hypothyroidism obesity  Vaginal dysplasia--1980 Rosacea Chest pain-- cardiac cath neg. 11/2002 Back pain IBS with diarrhea  asthma as a child  Adenomatous colon polyps 12/2007 Hemorrhoids vertigo  CON[facility]ULTANT[facility] Dr Arna Snipe  [phone] Dr Olegario Messier Richardson--gyn Dr Rushie Goltz Dr Antonieta Loveless Dr Marygrace Drought Dr Russella Dar-- GI ortho -- Dr Sena Slate   Past Surgical History: Last updated: 03/03/2010 Hysterectomy 1963 Cholecysectomy 2006 L TKR  2001 R TKR  1993 foot fracture  Conization d+c 1963 Appendectomy 1941 Cystocele /rectocele 1978 Laser for vaginal dysplasia 1981 or 84 6/09 EGD gastric polyps 6/09 colonosc- adenomatous polyps- re check 6 mo (one partially removed -- needs f/u) 111/09 colonoscopy adenomatous polyp- re check 2y 7/11 fall with ankle fx and ORIF -- followed by rehab stay  Family History: Last updated: 05/26/2008 mother leukemia father CAD- atherosclerosis, dementia brother heart problems and diabetes brother died of MI at 37 sister died of COPD- smoking Family History of Colon Cancer: brother diagnosed age late 38's Family History of Kidney Disease: son  Social History: Last updated: 07/29/2008 Retired--office work [employer] Married-2nd  Has 3 sons she cares for her chronically sick husband  quit smoking 1950s Alcohol use-no  Has living will No prolonged artifial ventilation. No tube feeds if vegetative or unaware  Risk Factors: Smoking Status: never (01/12/2007)  Review of Systems General:  Denies chills, fatigue, fever, loss of appetite, and malaise. Eyes:  Denies blurring and eye irritation. CV:  Denies chest pain or discomfort, lightheadness, palpitations, and shortness of breath with exertion. Resp:  Denies cough, pleuritic, shortness of breath, and wheezing. GI:  Complains of diarrhea; denies abdominal pain, change in bowel habits, nausea, and vomiting; still has chronic diarrhea . MS:  Complains of joint pain and stiffness; denies cramps. Derm:  Denies itching, lesion(s), poor wound healing, and rash. Neuro:  Denies numbness and tingling. Psych:  Denies anxiety and depression. Endo:  Denies excessive thirst and excessive urination. Heme:  Denies abnormal bruising and bleeding.  Physical Exam  General:  overweight but generally well appearing  Head:  normocephalic, atraumatic, and no abnormalities observed.   Eyes:  vision grossly intact, pupils equal, pupils round, and pupils reactive  to light.   Mouth:  pharynx pink and moist.   Neck:  supple with full rom and no masses or thyromegally, no JVD or carotid bruit  Chest Wall:  No deformities, masses, or tenderness noted. Lungs:  Normal respiratory effort, chest expands symmetrically. Lungs are clear to auscultation, no crackles or wheezes. Heart:  Normal rate and regular rhythm. S1 and S2 normal without gallop, murmur, click, rub or other extra sounds. Abdomen:  no renal bruits  Msk:  No deformity or scoliosis noted of thoracic or lumbar spine.  poor rom spine/ knees  needs assist with ambulation Pulses:  plus one pedal pulses today Extremities:  1 plus lymphedema in legs -- with hyperemia that is baseline / no warmth Neurologic:  sensation intact to light touch, gait normal, and DTRs symmetrical and normal.   Skin:  Intact without suspicious lesions or rashes Cervical Nodes:  No lymphadenopathy noted Psych:  pt c/o pain - and seems down about that   Diabetes Management Exam:    Foot Exam (with socks and/or shoes not present):       Sensory-Pinprick/Light touch:          Left medial foot (L-4): normal          Left dorsal foot (L-5): normal          Left lateral foot (S-1): normal          Right medial foot (L-4): normal          Right dorsal foot (L-5): normal          Right lateral foot (S-1): normal       Sensory-Monofilament:          Left foot: normal          Right foot: normal       Inspection:          Left foot: normal          Right foot: normal       Nails:          Left foot: normal          Right foot: normal    Eye Exam:       Eye Exam done elsewhere          Date: 04/01/2010          Results: normal          Done by: Dr Emily Filbert   Impression & Recommendations:  Problem # 1:  UNSPECIFIED VITAMIN D DEFICIENCY (ICD-268.9) Assessment Improved  vit d level is up but not in tx range yet will repeat 12 wk course of weekly high dose tx and then re check  hope this may help some aches and pains    Orders: Prescription Created Electronically [phone])  Problem # 2:  HYPERTENSION (ICD-401.9) Assessment: Unchanged  good control without change need to inc water intake based on bun and gfr Her updated medication list for this problem includes:    Indapamide 2.5 Mg Tabs (Indapamide) .Marland Kitchen... Take  1 tablet by mouth once daily    Metoprolol Tartrate 50 Mg Tabs (Metoprolol tartrate) .Marland Kitchen... Take 1/2 by month two times a day    Furosemide 20 Mg Tabs (Furosemide) .Marland Kitchen... Take 1 tablet by mouth once a day  BP today: 108/72 Prior BP: 100/60 (07/12/2010)  Labs Reviewed: K+: 3.8 (09/22/2010) Creat: : 1.2 (09/22/2010)   Chol: 220 (09/22/2010)   HDL: 38.20 (09/22/2010)   LDL: 66 (06/30/2010)   TG: 127.0 (09/22/2010)  Problem # 3:  HYPERLIPIDEMIA (ICD-272.4) Assessment: Deteriorated  high lipids off simvastatin which caused muscle pain  will see if lipitor 10 is affordible- has tol that before  disc low sat fat diet  rev labs with pt  The following medications were removed from the medication list:    Simvastatin 40 Mg Tabs (Simvastatin) .Marland Kitchen... Take 1 tablet by mouth once a day (holding for muscle pain) Her updated medication list for this problem includes:    Lipitor 10 Mg Tabs (Atorvastatin calcium) .Marland Kitchen... 1 by mouth once daily  Labs Reviewed: SGOT: 22 (09/22/2010)   SGPT: 17 (09/22/2010)   HDL:38.20 (09/22/2010), 35.20 (06/30/2010)  LDL:66 (06/30/2010), 52 (07/15/2009)  Chol:220 (09/22/2010), 122 (06/30/2010)  Trig:127.0 (09/22/2010), 105.0 (06/30/2010)  Orders: Prescription Created Electronically [id number])  Problem # 4:  DIABETES MELLITUS, TYPE II (ICD-250.00) Assessment: Deteriorated  this is worse - no doubt diet is partially to blame  non tol of metformin  will continue to slowly inc lantus and monitor disc imp of small meals/ DM diet difficult in light of limited mobility Her updated medication list for this problem includes:    Lantus Solostar 100 Unit/ml Soln (Insulin glargine)  ..... Inject 60 units as directed each evening  Labs Reviewed: Creat: 1.2 (09/22/2010)     Last Eye Exam: normal (04/01/2010) Reviewed HgBA1c results: 8.2 (09/22/2010)  8.0 (06/30/2010)  Orders: Prescription Created Electronically [id number])  Complete Medication List: 1)  Levothyroxine Sodium 150 Mcg Tabs (Levothyroxine sodium) .... Take 1 tablet by mouth once a day 2)  Indapamide 2.5 Mg Tabs (Indapamide) .... Take 1 tablet by mouth once daily 3)  Omeprazole 20 Mg Cpdr (Omeprazole) .... Take 1 tablet by mouth once a day 4)  Coumadin 5 Mg Tabs (Warfarin sodium) .... Take by mouth as directed 5)  Potassium Chloride Crys Cr 20 Meq Cr-tabs (Potassium chloride crys cr) .... Take one by mouth two times a day 6)  Metoprolol Tartrate 50 Mg Tabs (Metoprolol tartrate) .... Take 1/2 by month two times a day 7)  Onetouch Ultra Test Strp (Glucose blood) .... Check blood sugar two times a day for dm 250.0 and as needed 8)  Lantus Solostar 100 Unit/ml Soln (Insulin glargine) .... Inject 60 units as directed each evening 9)  Bd Pen Needle Ultrafine 29g X 12.10mm Misc (Insulin pen needle) .... Use as directed with insulin injections 10)  Furosemide 20 Mg Tabs (Furosemide) .... Take 1 tablet by mouth once a day 11)  Hydrocodone-acetaminophen 7.5-750 Mg Tabs (Hydrocodone-acetaminophen) .... One tabley by mouth every 6 hours as needed for pain. 12)  Methocarbamol ?mg (given By Dr Priscille Kluver  13)  Over The Counter Iron 325 Mg  .Marland KitchenMarland Kitchen. 1 by mouth once daily 14)  Tramadol Hcl 50 Mg Tabs (Tramadol hcl) .... One tablet by mouth every 8 hrs as needed for pain. 15)  Tylenol Extra Strength 500 Mg Tabs (Acetaminophen) .... Otc as directed. 16)  Imodium A-d 2 Mg Tabs (Loperamide hcl) .... Otc as directed. 17)  Lipitor 10 Mg Tabs (Atorvastatin calcium) .Marland KitchenMarland KitchenMarland Kitchen  1 by mouth once daily 18)  Vitamin D (ergocalciferol) 50000 Unit Caps (Ergocalciferol) .Marland Kitchen.. 1 by mouth once weekly for 12 weeks  Patient Instructions: 1)  lets try  lipitor for cholesterol- see if the generic is affordible  2)  increase dose of lantus from 50 to 60 units once daily  3)  take another 12 week course of vitamin D  4)  schedule fasting labs in 3 months lipid/ast/alt/ vit D / renal / AIC/ cbc with diff  and then follow up (272, 250.0 and low vit D and anemia ) 5)  work hard on diabetic diet with 3 small meals per day  Prescriptions: VITAMIN D (ERGOCALCIFEROL) 50000 UNIT CAPS (ERGOCALCIFEROL) 1 by mouth once weekly for 12 weeks  #12 x 0   Entered and Authorized by:   [name] MD   Signed by:   [name] MD on 10/05/2010   Method used:   Electronically to        [facility]* (retail)       [address]       [place], [place]  [address]       Ph: [phone]       Fax: [phone]   RxID:   [id number] LANTUS SOLOSTAR 100 UNIT/ML SOLN (INSULIN GLARGINE) inject 60 units as directed each evening  #1 month x 11   Entered and Authorized by:   [name] MD   Signed by:   [name] MD on 10/05/2010   Method used:   Electronically to        [facility]* (retail)       [address]       [place], [place]  [address]       Ph: [phone]       Fax: [phone]   RxID:   [id number] ONETOUCH ULTRA TEST  STRP (GLUCOSE BLOOD) Check blood sugar two times a day for DM 250.0 and as needed  #100 x 3   Entered and Authorized by:   [name] MD   Signed by:   [name] MD on 10/05/2010   Method used:   Electronically to        [facility]* (retail)       [address]       [place], [place]  [address]       Ph: [phone]       Fax: [phone]   RxID:   [id number] LIPITOR 10 MG TABS (ATORVASTATIN CALCIUM) 1 by mouth once daily  #30 x 11   Entered and Authorized by:   [name] MD   Signed by:   [name] MD on 10/05/2010   Method used:   Electronically to        [facility]* (retail)       [address]       [place], [place]  [address]       Ph: [phone]        Fax: [phone]   RxID:   [id number]    Orders Added: 1)  Prescription Created Electronically [G8553] 2)  Est. Patient Level IV [67893]    Current Allergies (reviewed today): PHENERGAN (PROMETHAZINE HCL) REGLAN (METOCLOPRAMIDE HCL) * NSAIDS GROUP * ONGLYZA SIMVASTATIN

## 2010-10-20 ENCOUNTER — Ambulatory Visit: Payer: MEDICARE

## 2010-10-21 ENCOUNTER — Telehealth: Payer: Self-pay | Admitting: Radiology

## 2010-10-21 ENCOUNTER — Ambulatory Visit (INDEPENDENT_AMBULATORY_CARE_PROVIDER_SITE_OTHER): Payer: Medicare Other | Admitting: Internal Medicine

## 2010-10-21 DIAGNOSIS — Z5181 Encounter for therapeutic drug level monitoring: Secondary | ICD-10-CM

## 2010-10-21 DIAGNOSIS — I4891 Unspecified atrial fibrillation: Secondary | ICD-10-CM

## 2010-10-21 DIAGNOSIS — Z7901 Long term (current) use of anticoagulants: Secondary | ICD-10-CM

## 2010-10-21 NOTE — Patient Instructions (Signed)
No changes

## 2010-10-21 NOTE — Telephone Encounter (Signed)
Order for INR

## 2010-10-27 ENCOUNTER — Other Ambulatory Visit: Payer: Self-pay | Admitting: Family Medicine

## 2010-10-28 ENCOUNTER — Other Ambulatory Visit: Payer: Self-pay | Admitting: *Deleted

## 2010-10-28 MED ORDER — FUROSEMIDE 20 MG PO TABS: 20.0000 mg | ORAL_TABLET | Freq: Every day | ORAL | Status: DC

## 2010-10-28 MED ORDER — POTASSIUM CHLORIDE CRYS ER 20 MEQ PO TBCR: 20.0000 meq | EXTENDED_RELEASE_TABLET | Freq: Two times a day (BID) | ORAL | Status: DC

## 2010-10-28 MED ORDER — WARFARIN SODIUM 5 MG PO TABS: ORAL_TABLET | ORAL | Status: DC

## 2010-11-18 ENCOUNTER — Other Ambulatory Visit: Payer: Medicare Other

## 2010-11-20 ENCOUNTER — Encounter: Payer: Self-pay | Admitting: Family Medicine

## 2010-11-24 ENCOUNTER — Ambulatory Visit (INDEPENDENT_AMBULATORY_CARE_PROVIDER_SITE_OTHER): Payer: Medicare Other | Admitting: Family Medicine

## 2010-11-24 ENCOUNTER — Other Ambulatory Visit (INDEPENDENT_AMBULATORY_CARE_PROVIDER_SITE_OTHER): Payer: Medicare Other | Admitting: Family Medicine

## 2010-11-24 ENCOUNTER — Encounter: Payer: Self-pay | Admitting: Family Medicine

## 2010-11-24 VITALS — BP 110/74 | HR 76 | Temp 97.7°F | Ht 62.0 in | Wt 248.2 lb

## 2010-11-24 DIAGNOSIS — Z7901 Long term (current) use of anticoagulants: Secondary | ICD-10-CM

## 2010-11-24 DIAGNOSIS — Z5181 Encounter for therapeutic drug level monitoring: Secondary | ICD-10-CM

## 2010-11-24 DIAGNOSIS — I4891 Unspecified atrial fibrillation: Secondary | ICD-10-CM

## 2010-11-24 DIAGNOSIS — R1900 Intra-abdominal and pelvic swelling, mass and lump, unspecified site: Secondary | ICD-10-CM

## 2010-11-24 NOTE — Progress Notes (Signed)
  Subjective:    Patient ID: [name], female    DOB: 09-04-1922, 77 y.o.   MRN: [id number]  HPI A few weeks ago she found a lump in abdominal abdomen  Is nontender Is mobile  She does inject lantus in abdomen  Has some bruises on abdomen Is not increasing in size  Is really tired - interested in having B12 checked with next labs     Review of Systems Review of Systems  Constitutional: Negative for fever, appetite change, and unexpected weight change.  Eyes: Negative for pain and visual disturbance.  Respiratory: Negative for cough and shortness of breath.   Cardiovascular: Negative.   Gastrointestinal: Negative for nausea, diarrhea and constipation.  Genitourinary: Negative for urgency and frequency.  Skin: Negative for pallor.  Neurological: Negative for weakness, light-headedness, numbness and headaches.  Hematological: Negative for adenopathy. Does not bruise/bleed easily.  Psychiatric/Behavioral: Negative for dysphoric mood. The patient is not nervous/anxious.          Objective:   Physical Exam  Constitutional: She appears well-developed and well-nourished.       overwt elderly female  HENT:  Head: Normocephalic and atraumatic.  Neck: No JVD present.  Cardiovascular: Normal rate, regular rhythm and normal heart sounds.   Pulmonary/Chest: Effort normal and breath sounds normal.  Abdominal: Soft. Bowel sounds are normal.       Many small areas of ecchymoses from injection  One 1-2 cm mobile superficial firm oval shaped lump in the fat of abdomen  Non tender Resembles hematoma   Lymphadenopathy:    She has no cervical adenopathy.  Skin: Skin is warm and dry. No rash noted. No erythema. No pallor.  Psychiatric: She has a normal mood and affect.          Assessment & Plan:

## 2010-11-24 NOTE — Assessment & Plan Note (Signed)
This appears to be a resolving hematoma from lantus injection  I adv that this is common and should go away on its own  If not imp 1 mo will update me - or if painful or inc in size

## 2010-11-24 NOTE — Patient Instructions (Signed)
Keep watching the spot on your abdomen I think it is scar tissue from a lantus injection  Avoid injecting in that spot again If not improving in 1-2 months let me know  Keep follow up for July  I will make sure your correct labs are ordered for June

## 2010-11-24 NOTE — Patient Instructions (Signed)
Continue current dose, check in 4 weeks  

## 2010-11-25 ENCOUNTER — Telehealth: Payer: Self-pay | Admitting: Family Medicine

## 2010-11-25 DIAGNOSIS — R5383 Other fatigue: Secondary | ICD-10-CM | POA: Insufficient documentation

## 2010-11-25 DIAGNOSIS — E039 Hypothyroidism, unspecified: Secondary | ICD-10-CM

## 2010-11-25 DIAGNOSIS — E119 Type 2 diabetes mellitus without complications: Secondary | ICD-10-CM

## 2010-11-25 DIAGNOSIS — M810 Age-related osteoporosis without current pathological fracture: Secondary | ICD-10-CM

## 2010-11-25 DIAGNOSIS — I10 Essential (primary) hypertension: Secondary | ICD-10-CM

## 2010-11-25 DIAGNOSIS — E785 Hyperlipidemia, unspecified: Secondary | ICD-10-CM

## 2010-11-25 DIAGNOSIS — E559 Vitamin D deficiency, unspecified: Secondary | ICD-10-CM

## 2010-11-25 NOTE — Telephone Encounter (Signed)
Message copied by [name] on Thu Nov 25, 2010 10:30 AM ------      Message from: Mills Koller      Created: Wed Nov 24, 2010  3:02 PM      Regarding: RE: order        No, she doesn't. Can you please order Future labs? Thanks ,T      ----- Message -----         From: [name], MD         Sent: 11/24/2010   2:41 PM           To: Mills Koller      Subject: order                                                    She has appt for labs on June 27th before her July f/u      Does she have lab orders yet? - I can't tell       Thanks !

## 2010-12-10 ENCOUNTER — Other Ambulatory Visit: Payer: Self-pay

## 2010-12-10 NOTE — Telephone Encounter (Signed)
Patient notified as instructed by telephone. Pt said she has been taking Simvastatin and is having no pain so she does want rx for Simvastatin. Thank you.

## 2010-12-10 NOTE — Telephone Encounter (Signed)
Pt called and said she never got the Lipitor which is the med on medication list because it was too expensive. Pt said she has been taking Simvastatin 40mg  one daily. Simvastatin is no longer on med list. Pt request [facility]ills [facility] to [facility]. Please advise.Marland Kitchen Pt contact # is [phone]. Pt said I did not need to call her back just send the medicine in.

## 2010-12-10 NOTE — Telephone Encounter (Signed)
In past it was noted that she was intolerant to zocor ? Pain -- please ask her about that -- is she ok with it ? Thanks

## 2010-12-12 MED ORDER — SIMVASTATIN 40 MG PO TABS: 40.0000 mg | ORAL_TABLET | Freq: Every day | ORAL | Status: DC

## 2010-12-12 NOTE — Telephone Encounter (Signed)
Px written for call in   

## 2010-12-15 MED ORDER — SIMVASTATIN 40 MG PO TABS: 40.0000 mg | ORAL_TABLET | Freq: Every day | ORAL | Status: DC

## 2010-12-15 NOTE — Telephone Encounter (Signed)
Med sent electronically to [facility] as instructed.

## 2010-12-16 ENCOUNTER — Telehealth: Payer: Self-pay | Admitting: *Deleted

## 2010-12-16 NOTE — Telephone Encounter (Signed)
[facility] is changing its brand of generic from Levoxyl to Levothyroxine. Fax to sign is on your shelf.

## 2010-12-17 NOTE — H&P (Signed)
NAMEJENNAVIEVE, ARRICK NO.:  [id number]   MEDICAL RECORD NO.:  [id number]          PATIENT TYPE:  ORB   LOCATION:  4526                         FACILITY:  [facility]   PHYSICIAN:  Ranelle Oyster, M.D.DATE OF BIRTH:  Nov 06, 1922   DATE OF ADMISSION:  05/06/2005  DATE OF DISCHARGE:                                HISTORY & PHYSICAL   CHIEF COMPLAINT:  Right leg pain and low back pain.   HISTORY OF PRESENT ILLNESS:  This is an 77 year old white female with a  history of degenerative spine disease, obesity and chronic low back pain who  was admitted on May 01, 2005, with progressive low back pain and a fall.  She had right ankle pain after this fall.  X-rays revealed a right fibular  fracture.  A Cam walker was recommended and non-weightbearing per Dr. Thereasa Distance  A. Mortenson.  An MRI of the back revealed multi-level spondylosis and  degenerative disk disease.  She had a large L2-L3 herniated nucleus pulposus  as well as spondylolisthesis of L4-L5.  The patient underwent an L3-L4  epidural steroid injection without apparent pain relief.  The patient still  has some right hip and posterolateral leg pain.  Back pain is somewhat an  issue as well.  Her right ankle has been tender.  She has been adherent to  her non-weightbearing precautions but slow to progress from the functional  standpoint.   REVIEW OF SYSTEMS:  The patient reports reflux incontinence, low back pain,  ankle pain, nocturia with frequency, occasional dysphagia, dyspnea on  exertion.   PAST MEDICAL/SURGICAL HISTORY:  1.  Positive for cervical cancer.  2.  Cystocele and rectocele repair.  3.  TAH.  4.  Obesity.  5.  Cholecystectomy.  6.  Diabetes type 2.  7.  Coronary artery disease.  8.  Arrhythmia.  9.  Hypothyroidism.  10. Bilateral total knee replacement.  11. Congestive heart failure.  12. Peripheral edema.  13. A lung nodule.   FAMILY HISTORY:  Positive for coronary artery disease and  cancer.   SOCIAL HISTORY:  The patient is married and lives in a one-level home with  two steps to enter.  She was independent prior to arrival.   MEDICATIONS PRIOR TO ADMISSION:  1.  Atenolol.  2.  Actos.  3.  Lozol.  4.  Glipizide.  5.  Synthroid.   ALLERGIES:  PHENERGAN, REGLAN AND ANTI-INFLAMMATORIES.   PHYSICAL EXAMINATION:  GENERAL:  The patient is a pleasant female, in no  acute distress.  VITAL SIGNS:  She is afebrile.  Vital signs are stable.  HEENT:  Pupils equal, reactive to light and accommodation.  Extraocular eye  movements are intact.  Face is obese.  Ears, nose, throat:  Exams  unremarkable.  NECK:  Supple without jugular venous distention or lymphadenopathy.  CHEST:  Clear to auscultation bilaterally, without wheezes, rales or  rhonchi.  HEART:  A regular rate and rhythm without murmurs, rubs or gallops.  The  patient has trace to 1+ bilateral lower extremity edema at the ankles.  ABDOMEN:  Soft, nontender.  EXTREMITIES:  Right ankle  was bruised on the lateral aspect, and the patient  had pain with palpation of the fibular head.  Pulses were 2+.  Sensation was  intact.  NEUROLOGIC:  Cranial nerves II-XII  were normal. Reflexes were 1+ and 2+  throughout.  Sensation normal.  The patient had good coordination for the  most part.  Judgment, orientation, memory and mood were all within normal  limits.  Motor function 5/5 in the upper extremities, 3+ to 4/5 in the lower  extremities, left greater than right.  BACK:  Some generalized tenderness.  It was painful somewhat with deep  palpation.  Straight leg testing was equivocal.   ASSESSMENT/PLAN:  1.  Functional deficits, secondary to right ankle fracture and low back pain      and questionable radiculopathy:  Begin subacute level rehab.  The      patient will have modified independent goals, primarily at the      wheelchair level.  She may be able to do some walking for short      distances to the bathroom,  etc.  2.  Pain management:  Add OxyContin for better relief, as well as Neurontin      for radicular pain.  3.  Deep venous thrombosis prophylaxis:  Subcutaneous Lovenox.  4.  Diabetes:  Continue Glucophage and Actos.  Check CBG's.  5.  Hypertension:  Continue Lozol and Verapamil.  6.  Urinary incontinence:  Check PVR's.  Begin Cipro for positive      urinalysis.      Ranelle Oyster, M.D.  Electronically Signed     ZTS/MEDQ  D:  05/06/2005  T:  05/06/2005  Job:  [id number]

## 2010-12-17 NOTE — Consult Note (Signed)
[place]. [facility]  Patient:    [name], [name]                      MRN: [id number] Proc. Date: 09/16/99 Adm. Date:  [id number] Attending:  Barkley Bruns CC:         Dr. Penni Bombard                          Consultation Report  HISTORY OF PRESENT ILLNESS:  Thank you very much for asking me to see [name]. She was admitted with an episode of near-syncope and rapid tachycardia and chest pain that radiated up to her shoulders.  She did not feel the tachycardia.  She has recently reported some abdominal pain associated with nausea but it had been more or less chronic.  Yesterday, she had the onset of diarrhea and had about four stools.  She began feeling presyncopal and then had somewhat of a choking feeling. She did not have palpitations.  The discomfort seemingly radiated to her shoulders and to the back of her neck and associated with shortness of breath, and she presented to the emergency room and noted to be in SVT.  She was given adenosine and had conversion. She has had dyspnea with limited activity.  Over the past four to five years, she had several episodes of presyncope that had been somewhat associated with a choking-like feeling.  She has had neurology evaluation and has had a negative cardiovascular evaluation about four to six years ago.  This was  similar to the previous episode she was having.  It should be pointed out that she did have SVT when she arrived to the emergency room but she was not feeling palpitations.  PAST MEDICAL HISTORY:  She has had non-insulin-dependent diabetes mellitus, hypertension, hypothyroidism, hypercholesterolemia, hiatal hernia with reflux, venous insufficiency, and osteoarthritis.  She has recently been started on Lasix 40 mg a day and that may be the etiology of her hypokalemia.  PAST SURGICAL HISTORY:  She is status post appendectomy, hysterectomy, cystocele and rectocele repair, right total  knee and vaginal surgery.  ALLERGIES:  PHENERGAN.  CURRENT MEDICATIONS: 1. K-Dur. 2. Glucotrol 10 mg b.i.d. 3. Verapamil SR 240 mg, just started. 4. Glucophage 500 mg b.i.d. 5. Lozol 1.25 mg a day. 6. Synthroid 0.25 mg. 7. Zantac 150 mg a day. 8. Ecotrin. 9. She is on Lasix at home.  FAMILY HISTORY:  Father died at age 27 with hardening arteries.  Mother died at age 45 of leukemia.  One brother died at age 63 of MI.  SOCIAL HISTORY:  She is married.  She has three sons.  No smoking, no alcohol. She stopped smoking in 1974.  REVIEW OF SYSTEMS:  As noted in the history of present illness.  PHYSICAL EXAMINATION:  GENERAL:  She is an obese but pleasant white female.  VITAL SIGNS:  Blood pressure 120/70 to 150/70, heart rate 70 at present, temperature normal, O2 saturation 95%.  SKIN:  Warm and dry.  NECK:  Full and thick.  No jugular venous distention.  No bruits.  LUNGS:  Clear.  HEART:  Regular rate and rhythm with distant heart sounds.  ABDOMEN:  Obese.  EXTREMITIES:  Without edema.  NEUROLOGIC:  She is intact.  PERTINENT LABORATORY DATA:  Her first troponin was less than 0.3.  A second troponin was 0.10, which would be equivocal and positive.  CKs were negative. Hematocrit 38.  Potassium 2.6.  BUN and creatinine were 17 and 0.9 respectively. Glucose 248.  EKG shows normal sinus rhythm with nonspecific changes.  IMPRESSION: 1. Near-syncope, probably secondary to SVT. 2. Chest pain, questionable related to arrhythmia. 3. Non-insulin-dependent diabetes mellitus. 4. Hypercholesterolemia. 5. Obesity. 6. Dyspnea on exertion.  PLAN:  We will repeat the troponin.  We will check a 2-D echocardiogram and we ill check an adenosine Cardiolite.  We will continue the Verapamil for now.  It may be that the Verapamil is all that she needs to prevent these recurrent episodes of  SVT.  It is clear that she is probably having neurological symptoms, probably related  to SVT, but she is not aware of palpitations.  If she continues to have  these spells, I think she probably needs to be referred for an EP study and possible ablation of her AV node.  We will evaluate her for structural heart disease.  If she has an abnormal adenosine Cardiolite study, we will probably need to schedule her for cardiac catheterization for evaluation. DD:  09/16/99 TD:  09/16/99 Job: [id number]

## 2010-12-17 NOTE — Op Note (Signed)
NAME:  [name], [name]                       ACCOUNT NO.:  [id number]   MEDICAL RECORD NO.:  [id number]                   PATIENT TYPE:  AMB   LOCATION:  DAY                                  FACILITY:  [facility]   PHYSICIAN:  Vikki Ports, M.D.         DATE OF BIRTH:  09-Sep-1922   DATE OF PROCEDURE:  12/15/2003  DATE OF DISCHARGE:                                 OPERATIVE REPORT   PREOPERATIVE DIAGNOSIS:  Symptomatic cholelithiasis.   POSTOPERATIVE DIAGNOSIS:  Symptomatic cholelithiasis.   PROCEDURE:  Laparoscopic cholecystectomy.   SURGEON:  Vikki Ports, M.D.   ANESTHESIA:  General.   DESCRIPTION OF PROCEDURE:  The patient was taken to the operating room and  placed in the supine position.  After adequate anesthesia was induced using  endotracheal tube, the abdomen was prepped and draped in the normal sterile  fashion.  Using a transverse infraumbilical incision I dissected down to the  fascia.  The fascia was opened vertically.  The peritoneum was entered and a  Hasson trocar was placed in the abdomen.  On entering with the camera, it  was noted that I was under the omentum which had been adherent to the  periumbilical area.  I opted then to use the Optiview and 11-mm trocar in  the subxiphoid region.  This was done under direct visualization.  This  verified the presence of omental adhesions to the periumbilical space, these  were taken down and inspected.  The Hasson trocar remained in the  periumbilical port and additional 5-mm ports were placed.  The gallbladder  was then identified and reflected cephalad.  Dissection at the neck easily  visualized the cystic duct, a good window was created posterior to it, its  junction with the gallbladder was identified and it was quite small, it was  triply clipped and divided.  The cystic artery was dissected in a similar  manner, triply clipped and divided.  The gallbladder was taken off the  gallbladder bed using  Bovie electrocautery and removed via the umbilical  port.  The infraumbilical fascial defect was closed with an 0 Vicryl purse-  string suture, anteriorly placed bowel near the omentum was inspected and  there were no injuries.  The 5-mm trocars were removed and pneumoperitoneum  was released.  The incisions were injected using Marcaine.  The incisions  were closed with subcuticular 4-0 Monocryl, Steri-Strips and sterile  dressings were applied.  The patient tolerated the procedure well and went  to PACU in good condition.                                               Vikki Ports, M.D.    KRH/MEDQ  D:  12/15/2003  T:  12/15/2003  Job:  [id number]

## 2010-12-17 NOTE — Cardiovascular Report (Signed)
NAME:  [name], [name]                       ACCOUNT NO.:  [id number]   MEDICAL RECORD NO.:  [id number]                   PATIENT TYPE:  INP   LOCATION:  6525                                 FACILITY:  [facility]   PHYSICIAN:  Colleen Can. Deborah Chalk, M.D.            DATE OF BIRTH:  May 16, 1923   DATE OF PROCEDURE:  12/11/2002  DATE OF DISCHARGE:  12/11/2002                              CARDIAC CATHETERIZATION   PROCEDURE:  Left heart catheterization with selective coronary angiography,  left ventricular angiography.   CARDIOLOGIST:  Colleen Can. Deborah Chalk, M.D.   INDICATIONS:  The patient is a 77 year old with multiple medical problems  referred for evaluation of atypical chest pain and increasing dyspnea on  exertion.  She is referred for catheterization.  She has had a history of  abnormal Cardiolite.   TYPE AND SITE OF ENTRY:  Percutaneous right femoral artery, percutaneous  right femoral vein.   CATHETERS:  A 6 French 4 curved Judkins right and left coronary catheters, 6  French pigtail ventriculographic catheter,.   CONTRAST MATERIAL:  Omnipaque.   MEDICATIONS GIVEN PRIOR TO THE PROCEDURE:  Valium 10 mg p.o.   MEDICATIONS GIVEN DURING THE PROCEDURE:  Versed 2 mg IV.   COMMENTS:  The patient tolerated the procedure well.   HEMODYNAMIC DATA:  The aortic pressure was 113/54, LV was 121/16/26.  There  was no aortic valve gradient noted on pullback.   ANGIOGRAPHIC DATA:  LEFT VENTRICULOGRAM:  Left ventricular angiogram was  performed in the RAO position.  The overall cardiac size and silhouette were  normal.  The left ventricular function is normal.  The ejection fraction was  55-60%.  There were no mitral regurgitation,  intracardiac calcification or  evidence of intracavitary filling defect.   CORONARY ARTERIES:  The coronary arteries arise and distribute normally.   1. The left main coronary artery was normal.  2. The left circumflex is of moderate size.  It is normal.  3.  The left anterior descending is normal.  There is a high diagonal     distribution almost in the intermediate range.  These vessels are     somewhat tortuous but normal.  4. The right coronary artery is a moderate size dominant vessel.  There is     some mild distal tortuosity.  Basically, the right coronary artery is     normal.   OVERALL IMPRESSION:  1. Normal coronary arteries.  2. Normal left ventricular function.   DISCUSSION:  Management for the patient will have to revolve around weight  loss primarily as well as management of multiple other medical problems.  Her current cardiac problems are not coronary in nature.  Colleen Can. Deborah Chalk, M.D.    SNT/MEDQ  D:  12/11/2002  T:  12/12/2002  Job:  [id number]   cc:   Tillman Abide, M.D.

## 2010-12-17 NOTE — Discharge Summary (Signed)
[place]. [facility]  Patient:    [name], [name]                    MRN: [id number] Adm. Date:  [id number] Disc. Date: [id number] Attending:  Herold Harms Dictator:   Dian Situ, P.A. CC:         Quita Skye. Artis Flock, M.D.             John L. Dorothyann Gibbs, M.D.                           Discharge Summary  DISCHARGE DIAGNOSES: 1. Status post right total knee replacement. 2. Klebsiella urinary tract infection, treated. 3. Diabetes mellitus. 4. Hypertension. 5. History of tachycardia. 6. Gastroesophageal reflux disease. 7. History of rosacea.  HISTORY OF PRESENT ILLNESS:  [name] is a 77 year old female with a history of diabetes mellitus, hypertension, tachycardia, DJD, status post right total knee replacement, and now with increasing left knee pain and gait problems.  She elected to undergo left total knee replacement April 18 by Dr. Priscille Kluver.  Postoperatively, was weightbearing as tolerated with CPM for passive range of motion and Coumadin for DVT prophylaxis.  She has had two units of packed red blood cells for post hemorrhagic anemia.  She still had some complaints of sinus drainage and was started on Zithromax.  Also, she has urine and bacteria, was started on Cipro.  Her p.o. intake has been variable. Patient reports history of chronic nausea.  At time of admission, patient is close supervision for transfers, close supervision for ambulating 75 feet.  PAST MEDICAL HISTORY:  See discharge diagnosis, plus history of hypothyroidism, intermittent tachycardia, peripheral edema, near-syncopal episodes, repair of rectocele, cystocele, and urinary frequency and incontinence.  ALLERGIES:  PHENERGAN.  SOCIAL HISTORY:  Patient lives with husband in one-level home with four steps at entry.  She was independent prior to admission.  She does not use any tobacco or alcohol.  HOSPITAL COURSE:  [name] was admitted to rehab on  November 22, 1999, for inpatient therapies to consist of PT, OT daily.  Post admission, patient continued to complain of nausea and asked for a trial of Glucophage as this could be contributing to her nausea.  Also, as patient with questionable increasing incontinence, PVRs x 3 were checked.  Bladder scan showed volumes ______ 73 cc and, therefore, were discontinued.  Her urine did grow out Klebsiella pneumonia and she was treated with a seven-day course of Cipro. Patient was maintained on Coumadin for DVT prophylaxis.  Bilateral lower extremity Duplex done on admission showed no evidence of DVT.  PT INR at time of discharge were therapeutic at 22.1 and 2.8, and patient is discharged on 1 mg a day Coumadin to continue through May 18.  Home health RN to draw pro times, April 30, with results to Dr. Kevan Rosebush office.  Patient was monitored on a.c./h.s. basis.  They were noted to be ranging from 140 to 198 range on Glucotrol XL 10 mg b.i.d. alone.  Patient was given the option of using Glucophage or Avandia and record blood sugars as trending high even at home and she is to follow up with Dr. Artis Flock regarding initiation of another oral hypoglycemic.  Patient is to continue to monitor blood sugars on b.i.d. rotating basis.  Patients left knee has healed without any signs or symptoms of infection, no drainage or erythema noted.  Mild edema of left  lower extremity is noted.  Patients pain was reasonably controlled with OxyContin CR as well as OxyContin IR for breakthrough pain.  Patients staples remained intact at time of discharge and she is to follow up with Dr. Priscille Kluver in five to seven days for staple removal.  Labs checked at admission showed a hemoglobin of 10.6, hematocrit 30.8, white count 7.9, platelets 297.  Check of electrolytes showed patient to be hyponatremic with potassium at 2.9.  She was supplemented and recheck on April 27 showed sodium 135, potassium 3.9, chloride 95, CO2 33, BUN 12,  creatinine 0.6, glucose 194.  At time of discharge, patient had progressed to being moderately independent for bed mobility, moderately independent for transfers, moderately independent for ambulation of 30 to 100 feet with a standard walker.  Knee flexion was at 82 degrees.  In terms of ADLs, she was moderately independent for upper body care, distance supervision for low body care, and supervision for toileting. Family is to provide assistance as needed.  On November 26, 1999, the patient is discharged to home.  DISCHARGE MEDICATIONS: 1. Estrace vaginal cream apply q.h.s. 2. Lipitor 10 mg q.h.s. 3. Glucotrol XL 10 mg b.i.d. 4. Verapamil SR 240 mg per day. 5. Levothyroxine 150 mcg per day. 6. Lozol 1.25 mg per day. 7. Coumadin 2 mg 1-1/2 p.o. per day through May 18. 8. OxyContin CR 20 mg q.12h. x 7 days, then p.r.n. 9. Tylenol No. 3 one to two p.o. q.4-6h. p.r.n. pain.  ACTIVITY:  To use walker.  DIET:  Diabetic restrictions, to check blood sugars on a b.i.d. basis.  SPECIAL INSTRUCTIONS:  No alcohol, no smoking, no driving.  Home health PT to be provided by [facility], home health RN to draw pro times on Monday, April 30, with the results to Dr. Artis Flock.  FOLLOW-UP:  Patient is to follow up with Dr. Artis Flock in one month for a check of blood pressure, postoperative anemia, as well as diabetic management.  Follow up with Dr. Priscille Kluver in five to seven days for staple removal.  Follow up with Dr. Ellwood Dense as needed. DD:  12/09/99 TD:  12/11/99 Job: [id number]

## 2010-12-17 NOTE — H&P (Signed)
NAME:  [name] NO.:  [id number]   MEDICAL RECORD NO.:  [id number]          PATIENT TYPE:  OBV   LOCATION:  1829                         FACILITY:  [facility]   PHYSICIAN:  Ulyses Amor, MD DATE OF BIRTH:  1923/06/16   DATE OF ADMISSION:  02/03/2006  DATE OF DISCHARGE:                                HISTORY & PHYSICAL   [name] is an 77 year old white woman who was admitted to [facility] because of chest pain as well as new onset atrial fibrillation.   The patient has no past history of coronary artery disease including no  history of chest pain or myocardial infarction.  Nor is there a history of  congestive heart failure.  She does reportedly have a history of  supraventricular tachycardia.  The patient presented to the emergency  department after experiencing chest pain and dyspnea this morning.  The  chest pain is described as a tightness in her upper anterior chest and her  throat.  It has been associated with dyspnea, but no diaphoresis or nausea.  She has not noted a rapid or irregular heart beat.  Nor has there been any  dizziness, light headedness, weakness, syncope, or near syncope.  She  presented to the emergency department where she was found to be in atrial  fibrillation with a moderate ventricular rate.  At this time, her chest  discomfort has largely resolved.  In addition, her dyspnea has resolved.  She is brought into the hospital for further evaluation and management of  the chest discomfort, dyspnea, and atrial fibrillation.   In addition to the aforementioned problems, the patient has a history of  hypertension, dyslipidemia, diabetes mellitus, hiatal hernia with  gastroesophageal reflux, and hypothyroidism.   MEDICATIONS:  Indapamide, Synthroid, Glipizide, Actos, Omeprazole,  Verapamil, Simvastatin, Propanol.   ALLERGIES:  LATEX, PHENERGAN, AND REGLAN.   PAST SURGICAL HISTORY:  She has undergone many operations  in her life.  The  ones that she can recall at this time are bilateral cataracts and bilateral  knee replacements.   SIGNIFICANT INJURIES:  None.   SOCIAL HISTORY:  The patient lives with her husband.  She neither smokes  cigarettes or drinks alcohol.   FAMILY HISTORY:  Noncontributory.   REVIEW OF SYMPTOMS:  No new problems related to her head, eyes, nose, ears,  mouth, throat, lungs, gastrointestinal symptoms, genitourinary symptoms, or  extremities.  There is no history of neurologic or psychologic disorder.  There is no history of fever, chills, or weight loss.   PHYSICAL EXAMINATION:  VITAL SIGNS:  Blood pressure 139/86, pulse 88 and irregularly irregular,  respirations 18, temperature 98.6, pulse ox 98% on room air.  GENERAL:  The patient was an obese, elderly white woman in no discomfort.  She was alert, oriented, appropriate and responsive.  HEENT:  Head, eyes, nose, and mouth were normal.  NECK:  Without thyromegaly or adenopathy.  Carotid pulses palpable  bilaterally and without bruits.  CARDIAC:  Irregularly irregular rhythm.  There was no gallop, murmur, rub,  or click.  No chest wall tenderness was noted.  LUNGS:  Clear.  ABDOMEN:  Soft, nontender.  There was no mass, hepatosplenomegaly, bruit,  distention, rebound, guarding, or rigidity.  Bowel sounds were normal.  BREASTS, PELVIC AND RECTAL:  Not performed as they were not pertinent for  the reason for acute care hospitalization.  EXTREMITIES:  Without deviation or deformity.  Marked bilateral lower  extremity edema was noted.  Radial and dorsalis pedal pulses were palpable  bilaterally.  NEUROLOGICAL:  A brief screening neurological survey was unremarkable.   LABORATORY DATA:  The electrocardiogram revealed atrial fibrillation with a  ventricular rate of 61 beats per minute.  The chest radiograph, according to  the radiologist, demonstrated no evidence of acute disease.  Chest CT  revealed no evidence of a  pulmonary embolus.  The initial set of cardiac  markers revealed a myoglobin 113, CK MB 1.5, and troponin less than 0.05.  White count 7.1, hemoglobin 11.2, hematocrit 34.  Potassium 3.9, BUN 22,  creatinine 1.2.  The remaining studies were pending at the time of this  dictation.   IMPRESSION:  1.  New onset atrial fibrillation with moderate ventricular rate, history of      severe ventricular tachycardia.  2.  Chest pain; rule out cardiac ischemia.  3.  Hypertension.  4.  Dyslipidemia.  5.  Diabetes mellitus.  6.  Gastroesophageal reflux; hiatal hernia.  7.  Hypothyroidism.   PLAN:  1.  Telemetry.  2.  Serial cardiac enzymes.  3.  Intravenous heparin.  4.  Intravenous nitroglycerin.  5.  Aspirin.  6.  Echocardiogram.  7.  Thyroid function tests.  8.  Continue Propranolol and Verapamil.  9.  Further management per Dr. Deborah Chalk.      Ulyses Amor, MD  Electronically Signed     MSC/MEDQ  D:  02/03/2006  T:  02/03/2006  Job:  [id number]   cc:   Colleen Can. Deborah Chalk, M.D.  Fax: [phone]

## 2010-12-17 NOTE — H&P (Signed)
[place]. [facility]  Patient:    [name], [name]                      MRN: [id number] Adm. Date:  [id number] Attending:  Barkley Bruns CC:         Quita Skye. Artis Flock, M.D.                         History and Physical  CHIEF COMPLAINT:  "Dizziness and chest pain."  HISTORY OF PRESENT ILLNESS:  The patient is a 77 year old female with hypertension and type 2 diabetes who experienced sudden onset of a near syncopal episode, rapid heartbeat, upper chest discomfort with radiation to her jaw and left arm, nausea, and dyspnea at 6:20 p.m. this evening.  She came to the emergency department where her pulse was found to be 180 and her monitor showed PSVT.  The episode was terminated by IV Adenocard and the pain went away soon thereafter.  The patient  has had no prior episodes of rapid heartbeat but does have a four to five year history of infrequent episodes of nonexertional upper chest discomfort radiating to her back and lasting 15 to 45 minutes at a time.  She saw Dr. Deborah Chalk when these first began, but her workup was negative for cardiac disease.  PAST MEDICAL HISTORY:  Surgeries:  The patient had an appendectomy in 1941, a hysterectomy in 1963.  A cystocele/rectocele repair in 1978.  In 1993, she had  right total knee replacement done by Dr. 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  OTHER HOSPITALIZATIONS:  None.  OTHER ILLNESSES:  The patient has been treated in the office for hyperlipidemia, type 2 diabetes, hypertension, hypothyroidism, hiatal hernia and reflux, venous  insufficiency, and recurring abdominal pain and nausea.  MEDICATIONS: 1. Glucotrol 10 mg once a day. 2. Glucophage 500 mg b.i.d. 3. Lozol 1.25 mg once a day. 4. Synthroid 150 mcg once a day. 5. Zantac 150 b.i.d. 6. Calcium carbonate 500 mg three per day. 7. Premarin vaginal cream. 8. Ecotrin two per day. 9. Lasix  40 mg once a day.  ALLERGIES:  The patient is intolerant to [facility] which causes excessive sedation.  FAMILY HISTORY:  Her mother died at age 26 with leukemia.  Her father died at age 63 with Alzheimers disease.  A brother died at age 32 of an MI and she has another brother who is alive but has CHF and diabetes.  A sister died at age 63 with COPD. She has three sons, one of whom has juvenile diabetes and the other has hypertension and polycystic kidney disease, and the other is in good health.  SOCIAL HISTORY:  The patient is married.  She is a retired Film/video editor and has never used alcohol or tobacco.  REVIEW OF SYSTEMS:  She denies other systemic, skin, eye, ENT, respiratory, cardiovascular, GI, GU, musculoskeletal, or neurological complaints.  PHYSICAL EXAMINATION:  VITAL SIGNS:  Blood pressure 116/92, pulse 98 and regular.  This was after she received the Adenocard.  Respirations were 16, temperature was 98.5.  GENERAL:  The patient appears alert and in no acute distress.  SKIN:  Warm and dry, no rash.  EYES:  Pupils equal, round and reactive to light.  Full EOMs.  Fundi benign. Sclerae non-icteric.  ENT:  TMs are normal.  No intraoral lesions.  Mucous membranes are moist. Pharynx  is clear.  NECK:  Supple.  No adenopathy, JVD, or bruits.  Thyroid normal.  LUNGS:  Clear to auscultation and percussion.  HEART:  Regular rhythm.  No gallop or murmur.  ABDOMEN:  Soft and nontender without organomegaly or mass.  Bowel sounds normally active.  EXTREMITIES:  No edema.  Pulses full.  NEUROLOGICAL EXAM:  Alert and oriented x 3.  Speech was clear and appropriate. No focal muscular weakness or tremor.  DTRs 2+ and symmetrical. Babinskis downgoing. Cranial nerves intact.  ADMITTING IMPRESSION: 1. Paroxysmal supraventricular tachycardia. 2. Angina, possible rate related. 3. Type 2 diabetes. 4. Hypertension. 5. Hypothyroidism. 6. Reflux esophagitis. 7.  Hypokalemia. 8. Venous stasis disease. 9. Osteoarthritis.  PLAN: 1. Monitor via telemetry. 2. Serial CK-MB, troponins and EKGs. DD:  09/16/99 TD:  09/16/99 Job: [id number]

## 2010-12-17 NOTE — Discharge Summary (Signed)
[place]  Patient:    ALLIEN, [name]                    MRN: [id number] Adm. Date:  [id number] Disc. Date: [id number] Attending:  Herold Harms Dictator:   Arnoldo Morale, P.A. CC:         Quita Skye. Artis Flock, M.D. at office                           Discharge Summary  ADMISSION DIAGNOSES:  1. End-stage osteoarthritis of the left knee.  2. Type 2 diabetes mellitus.  3. Hypertension.  4. Hypothyroidism.  5. Hiatal hernia.  6. Gastroesophageal reflux disease.  7. Hypercholesterolemia.  8. Hypertriglyceridemia.  9. Rosacea. 10. History of tachycardia. 11. Peripheral vascular disease. 12. History of cervical cancer.  DISCHARGE DIAGNOSES:  1. Post hemorrhagic anemia - acute.  2. Urinary tract infection.  3. Sinus infection.  SURGICAL PROCEDURE:  On November 17, 1999, [name] underwent a left total knee arthroplasty by Dr. Jonny Ruiz L. Rendall.  COMPLICATIONS:  None.  CONSULTS:  1. Diabetes coordinator and pharmacy consult on November 18, 1999 in addition     to a physical therapy consult.  2. Occupational therapy and rehab medicine consult on November 19, 1999.  HISTORY OF PRESENT ILLNESS:  This 77 year old white female presented to Dr. Priscille Kluver with a history of progressively-worsening left knee pain since 1993.   She does have a history of a right total knee in 1993 and over the last year the pain in her left knee has gotten progressively worse, and is located diffusely about the joint.  There is radiation into her hip and back and down to her left ankle, and the pain is intermittent.  It is aching and becomes sharp at times, and increases with prolonged standing, walking, or weightbearing.  She has difficulty moving from a sitting to standing position, and nothing really helps the pain.  Because of this, she is presenting for a left total knee arthroplasty.  HOSPITAL COURSE:  [name] tolerated her surgical procedure well without immediate  postoperative complications.  She was subsequently transferred to [facility] [place].  On postoperative day #1, she was drowsy and complained of some mild nausea.  This was treated with Reglan.  TMAX was 100, vital signs stable.  Her left knee dressing was intact with minimal drainage.  Her leg was neurovascularly intact.  Her hemoglobin was 9.7 with hematocrit of 27.8.  She was fairly drowsy and her PCA was held, and she was started on Percocet p.r.n. for pain.  She was started on PT per protocol.  Postoperative day #2, she was much more awake and alert, but complained of decreased energy.  She had some old, bloody sinus drainage and complained of some sinus congestion.  Percocet was helping her pain.  TMAX was 100.7 and her vital signs were stable.  Left knee incision was well-approximated with staples with minimal drainage.  Leg was neurovascularly intact.  Her hemoglobin was 8.9 with hematocrit of 26 and she was started on PT per protocol and also switched to p.o. medications.  She was transfused with two units of packed red blood cells with Lasix between the units and started on a Z pack for complaints of sinus drainage and possible infection.  On postoperative day #3, she was doing much better after the transfusion.  No more complaints of sinus congestion.  TMAX 100.4, vital signs  stable.  Left knee incision unchanged.  White count 9.4, hemoglobin 10.3, hematocrit 29, PT 17.8, and INR 1.8.  H&H was monitored and she was continued on therapy.  On April 21, she complained of some nausea and lack of bowel movement.  She was given Fleets enema, Dulcolax suppository to help with constipation.  Her urine was noted to be very foul smelling and her temperature was still 100. She was started on Cipro 250 p.o. b.i.d. for three days.  She complained of sedation due to the Oxycontin, so that was decreased to 10 mg p.o. q.12h.  On April 23, she was doing better, with less sedation with the Oxycontin 10  mg. She had had good results with the enema.  She did complain, still, of mild nausea.  TMAX was 99.1 and her vital signs were stable.  Left knee incision was unchanged.  Her urinalysis the day before had shown trace blood, positive nitrates, large leukocytes, many epithelial, 21-50 white cells, 0-5 red cells, and many bacteria.  She was believed to be stable enough for transfer to rehab when a bed became available, and she was transferred to rehab later that day.  DISCHARGE INSTRUCTIONS:  She is to continue her pre hospital medications and diet and also the medications she was placed on here in the hospital.  She is to continue Coumadin per protocol.  ACTIVITY:  She is to be weightbearing as tolerated on the left leg with use of the walker, and is to continue physical therapy, occupational therapy per rehabilitation protocol.  FOLLOW-UP:  She is to keep staples in her left knee until about postoperative day #14, then they can be removed and Steri-Strips applied.  If this is done in rehab, she does not need to see Dr. Priscille Kluver for another two to three weeks. Otherwise, she needs to return at about postoperative day #14 to our office for staple removal and postoperative check.  LABORATORY DATA:  Adenosine Cardiolite study done on February 16 showed reversible defect in the anteroseptal wall, suspicious for myocardial ischemia, and her calculate left ventricular ejection fraction was 57%.  Chest x-ray done on September 15, 1999 showed no definite acute pulmonary disease.  Chest x-ray done on November 17, 1999 showed no evidence for acute disease.  On April 16, her white count was 7.1. with hemoglobin 13.4, hematocrit 38, platelet count 288.  On April 19, her hemoglobin was 9.7 with hematocrit 27.8. On April 20, her hemoglobin was 8.9. with hematocrit 26.  On April 21, her white count was 9.4 with hemoglobin 10.3 and hematocrit 29.  On April 16, her PT was 13.7 seconds with an INR of 1.1  and a PTT of 32.  On April 23, her PT was 21.1 seconds with an INR of 2.5.   On April 16, her glucose was 138.  On April 19, her sodium was 135, potassium 3.0, CO2 34, glucose 202, and calcium 7.9.  Urinalysis on April 16 was within normal limits with the exception of a few epithelial cells found and a small leukocyte esterase.   Repeat UA on April 22 showed clear yellow urine with 100 mg/dcl of glucose, trace hemoglobin, large amount leukocyte esterase, many epithelial, 25-50 white cells, 0-5 red cells, and many bacteria.  The bacteria was too numerous to count.  Urine culture eventually grew out greater than 100,000 colonies per ml of Proteus mirabilis. This was susceptible to all antibiotics tested except Macrodantin. DD:  12/01/99 TD:  12/02/99 Job: [id number]

## 2010-12-17 NOTE — H&P (Signed)
NAME:  [name], [name]                       ACCOUNT NO.:  [id number]   MEDICAL RECORD NO.:  [id number]                   PATIENT TYPE:  INP   LOCATION:  1823                                 FACILITY:  [facility]   PHYSICIAN:  Colleen Can. Deborah Chalk, M.D.            DATE OF BIRTH:  1923/01/21   DATE OF ADMISSION:  12/10/2002  DATE OF DISCHARGE:                                HISTORY & PHYSICAL   CHIEF COMPLAINT:  Dyspnea on exertion with atypical chest pain.   HISTORY OF PRESENT ILLNESS:  The patient is a 77 year old morbidly obese  white female who has multiple cardiovascular risk factors who presents to  the emergency department today from her primary care physician with  complaints of worsening dyspnea on exertion as well as chest discomfort.  She was seen here towards the latter part of April with complaints of  fatigue and vague chest pain as well as palpitations.  She subsequently had  a two day  adenosine Cardiolite which showed an anterior defect.  This past  Friday she presented to the office as a work-in appointment with complaints  of tachycardia.  She was noted to have SVT which was converted with  adenosine and Cardizem.  She had missed several days of her medicines.  She  was actually due to come into the office tomorrow for precatheterization  visit.  She notes that over the past few days she has had worsening  shortness of breath with just minimal activity.  She has had some lower mid  sternal chest burning which seemingly is not exertional in nature.  She is  now referred for admission and to proceed on with elective cardiac  catheterization.   PAST MEDICAL HISTORY:  1. Obesity.  2. Hypertension.  3. SVT with past refusal for ablation.  4. Previous total knee replacement in 1993.  5. Previous laser surgery for vaginal dysplasia.  6. History of cystocele and rectocele repair.  7. Status post hysterectomy.  8. Status post appendectomy.  9. Hypothyroidism.  10.       Hiatal hernia.  11.      Gastroesophageal reflux disease.  12.      Hypertension.  13.      Diabetes.  14.      Previous history of herpes with subsequent herpetic neuralgia.   ALLERGIES:  VASOTEC which causes cough.   CURRENT MEDICATIONS:  1. Lasix 40 mg daily.  2. Atenolol 25 mg daily.  3. Potassium 20 mEq daily.  4. Actos 30 mg daily.  5. Lipitor 10 mg daily.  6. Inderal PRN.  7. Verapamil SR 240 mg daily.  8. Indapamide 1.25 mg daily.  9. Calcium daily.  10.      Aspirin daily.  11.      Glucotrol XL 10 mg b.i.d.  12.      Zantac 150 mg b.i.d.  13.      Premarin cream at bedtime.  14.      Synthroid 150 mcg daily.   FAMILY HISTORY:  Positive for heart disease.   SOCIAL HISTORY:  She is married.  She has three children.  There is no  alcohol or tobacco use.   REVIEW OF SYMPTOMS:  As noted above and is otherwise unremarkable.  She  denies any history of cough.  She does note that she has had a 7 pound  weight gain over the past three to four days.   PHYSICAL EXAMINATION:  GENERAL:  She is a morbidly obese white female  currently in no acute distress and currently pain free.  VITAL SIGNS:  Blood pressure 111/66.  Heart rate is in the 60's.  Respiratory rate 18.  She is afebrile.  SKIN:  Warm and dry.  Color is unremarkable.  NECK:  Full.  LUNGS:  Clear.  HEART:  Regular rhythm.  Heart tones are somewhat distant.  ABDOMEN:  Obese yet soft.  Positive bowel sounds, nontender.  EXTREMITIES:  Quite full with 1 to 2+ edema bilaterally.  NEUROLOGICAL:  Intact.   LABORATORY DATA:  Pertinent labs are pending.  First CK and troponin are  negative.   OVERALL IMPRESSION:  1. Atypical chest pain.  2. Dyspnea on exertion.  3. Recently abnormal adenosine Cardiolite.  4. History of supraventricular tachycardia.  5. Morbid obesity.  6. Hypertension.  7. Hyperlipidemia.  8. Diabetes.   PLAN:  Will proceed on with elective cardiac catheterization in the morning.  The patient  will be  placed on intravenous heparin and Nitrol paste.  Will  check B-peptide in addition to her routine labs.     Juanell Fairly C. Earl Gala, N.P.                 Colleen Can. Deborah Chalk, M.D.    LCO/MEDQ  D:  12/10/2002  T:  12/10/2002  Job:  [id number]   cc:   Dr. Karie Schwalbe

## 2010-12-17 NOTE — Discharge Summary (Signed)
NAMEBENNETT, VANSCYOC NO.:  [id number]   MEDICAL RECORD NO.:  [id number]          PATIENT TYPE:  ORB   LOCATION:  4526                         FACILITY:  [facility]   PHYSICIAN:  Ranelle Oyster, M.D.DATE OF BIRTH:  [birth year]   DATE OF ADMISSION:  05/06/2005  DATE OF DISCHARGE:  05/13/2005                                 DISCHARGE SUMMARY   DISCHARGE DIAGNOSES:  1.  Right ankle fracture.  2.  L2-L3 herniated nucleus pulposus with spondylolisthesis L4-L5.  3.  Diabetes mellitus type 2.  4.  Obesity.  5.  Hyperactive bladder.  6.  Hypothyroidism.   HISTORY OF PRESENT ILLNESS:  Ms. [name] is an 77 year old female with  history of DDD, DJD, obesity, chronic low back pain admitted on October 1  with progressive low back pain and fall with right ankle pain. X-rays in ED  revealed right fibula fracture. The patient was placed in a CAM walker and  non weightbearing status per Dr. Chaney Malling and Dr. Marciano Sequin. MRI of the back  was done secondary to complaints of back pain. This revealed large L2-L3 HNP  and spondylolisthesis of L4-L5. The patient underwent ESI of right inferior  L4 space on October 4 by radiology. She has been followed by Dr. Jordan Likes and  questions of surgery in the future. Mobility is currently impaired with the  patient having decreased endurance and dyspnea on exertion. Non  weightbearing status is a limiting factor. She is currently requiring  assists for transfers and ambulation and was concerned for further  therapies.   PAST MEDICAL HISTORY:  1.  Significant for cervical cancer requiring TAH.  2.  Cystocele, rectocele repair.  3.  Obesity.  4.  Cholecystectomy.  5.  DM type 2.  6.  Coronary artery disease.  7.  Arrhythmia.  8.  Hypothyroid.  9.  Bilateral total knee replacement.  10. History of CHF.  11. Peripheral edema and left lung nodule.   ALLERGIES:  1.  PHENERGAN.  2.  REGLAN.  3.  ANTI-INFLAMMATORIES.   FAMILY HISTORY:  1.   Positive for coronary artery disease.  2.  Cancer.   SOCIAL HISTORY:  The patient is married, lives in a one level home with two  plus two steps at entry. She does not use any tobacco or alcohol.   HOSPITAL COURSE:  Ms. Deonna Krummel was admitted to subacute on May 06, 2005 for rehab therapy to consist of PT, OT daily. Past admission Foley was  discontinued, and voiding was monitored as the patient had complaints of  chronic incontinence. PVR shows low volumes.   Labs done past admission include check of CBC revealing hemoglobin 12.4,  hematocrit 36.3, white count 10.9, platelets 342. Check of lytes revealed:  Sodium 139, potassium 3.7, chloride 101, CO2 32, BUN 18, creatinine 0.9,  glucose 158. The patient's CBGs were monitored on a.c. and h.s. basis. They  are noted to be elevated ranging from 140s to 190s. Glucophage was increased  to t.i.d. a.c. prior to discharge. She is to follow up with Dr. Verda Cumins  for further adjustment in her meds. The  patient was maintained on subcu  Lovenox for DVT prophylaxis throughout her stay. The right lower extremity  is neurovascularly intact. CAM walker is in place. There was a question of  whether CAM walker was well-fitting and whether she would require cast  instead of CAM walker. Ortho evaluated her and felt that CAM walker would do  for now. The patient is to continue non weightbearing status on right lower  extremity. The patient is being controlled with reasonable use of OxyContin  CR with p.r.n. oxycodone IR. During this stay in subacute the patient  progressed requiring sit up assist for upper body ADLs, supervision to  modified independent for low body ADLs. She needs supervision for transfers.  Ambulation not tested secondary to difficulty with non weightbearing status.  On May 13, 2005 the patient was discharged to home.   DISCHARGE MEDICATIONS:  1.  Glucotrol 10 mg p.o. b.i.d.  2.  Glucophage 500 mg p.o. t.i.d.  3.   Lipitor 10 mg a day.  4.  Lozol 2.5 mg a day.  5.  Diltiazem 150 mcg a day.  6.  Prilosec 20 mg a day.  7.  Mobic 7.5 mg a day.  8.  Actos 30 mg a day.  9.  Oxycodone IR 5-10 mg q.4-6h. p.r.n. pain.  10. OxyContin CR 10 mg p.o. q.12h.  11. Verapamil SR 240 mg a day.  12. Nevanac 0.1% ophthalmic drops t.i.d. were used.  13. __________ 0.5% ophthalmic drops each eye t.i.d.  14. Econopred 1% eye drops one GT each eye t.i.d.      Greg Cutter, P.A.      Ranelle Oyster, M.D.  Electronically Signed   PP/MEDQ  D:  05/13/2005  T:  05/14/2005  Job:  [id number]   cc:   Jonny Ruiz L. Rendall, M.D.  Fax: [phone]

## 2010-12-17 NOTE — Telephone Encounter (Signed)
Patient notified as instructed by telephone. Pt said Ok. completed form faxed to [phone] as instructed.

## 2010-12-17 NOTE — Consult Note (Signed)
[name], SCHNYDER NO.:  [id number]   MEDICAL RECORD NO.:  [id number]          PATIENT TYPE:  EMS   LOCATION:  MAJO                         FACILITY:  [facility]   PHYSICIAN:  Colleen Can. Deborah Chalk, M.D.DATE OF BIRTH:  1923-03-12   DATE OF CONSULTATION:  05/25/2004  DATE OF DISCHARGE:                                   CONSULTATION   HISTORY:  Ms. Mcgrory is a 77 year old female with a history of recurrent  episodes of supraventricular tachycardia in the past.  She does have an  underlying history of hypertensive heart disease.  She has been given  propranolol to take as a p.r.n. medicine.  Usually this works.  Her last  episode of supraventricular tachycardia was a year and a half ago.  She was  in Dr. Karle Starch office today for planned routine office visit when she  developed SVT.  She took two tablets of propranolol 10 mg.  She was seen and  was still tachycardic and subsequently referred to the emergency room.  By  the time she arrived, she had converted to sinus rhythm and basically felt  well.  She did not have chest pain or significant shortness of breath or  other abnormalities during the episode.   She has a history of hypertensive heart disease and had a 2 D echocardiogram  which showed mild left atrial enlargement and mild left ventricular  hypertrophy.  She had a cardiac catheterization done in May 2004 which  showed normal coronary arteries and normal left ventricular function.  She  has a history otherwise of obesity, diabetes mellitus, hypothyroidism.   Her chronic medicines include:  1.  Synthroid 0.15 mg per day.  2.  Premarin cream h.s.  3.  Zantac 150 mg b.i.d.  4.  Glucotrol 10 mg b.i.d.  5.  Ecotrin one a day.  6.  Lozol 1.25 mg a day.  7.  Verapamil SR 240 mg one a day.  8.  Propranolol 10 mg up to four times a day on a p.r.n. basis.  9.  Lipitor 10 mg a day.  10. Actos 30 mg a day.  11. Potassium chloride 20 mEq per day.  12. Furosemide 40  mg p.r.n.   Review of records from [facility] shows EKG with narrow complex  tachycardia with a rate of 153 beats a minute.  Her exam was otherwise  unremarkable at that point in time.  Lab work was not performed.   PHYSICAL EXAMINATION:  GENERAL:  On exam today she is very pleasant, in no  acute distress.  VITAL SIGNS:  Blood pressure is 140/78, heart rate is 83, temperature is  97.2, respiratory rate is 20. O2 saturation is 93% on room air.  HEENT:  Negative.  CHEST:  Lungs are clear.  CARDIAC:  Regular rate and rhythm.  EXTREMITIES:  Chronic lower extremity edema.   EKG shows normal sinus rhythm.   OVERALL IMPRESSION:  1.  Supraventricular tachycardia, resolved.  2.  History of hypertensive heart disease.  3.  Obesity.  4.  Adult-onset diabetes mellitus.  5.  Hyperlipidemia.   PLAN:  Will continue her on p.r.n. propranolol.  She has been given the  option of ablative therapy and has basically refused.  She will continue the  p.r.n. propranolol.       SNT/MEDQ  D:  05/25/2004  T:  05/25/2004  Job:  [id number]   cc:   Corinda Gubler at [facility]   Dr. Sima Matas   Dr. Alphonsus Sias, [place]

## 2010-12-17 NOTE — Op Note (Signed)
[place]  Patient:    [name], [name]                   MRN: [id number] Proc. Date: 11/17/99 Attending:  Jonny Ruiz L. Dorothyann Gibbs, M.D.                           Operative Report  PREOPERATIVE DIAGNOSIS:  Osteoarthritis of the left knee.  POSTOPERATIVE DIAGNOSIS:  Osteoarthritis of the left knee.  SURGICAL PROCEDURE:  Left LCS total knee replacement.  SURGEON:  John L. Dorothyann Gibbs, M.D.  ASSISTANT:  Arnoldo Morale, P.A.  ANESTHESIA:  General.  PATHOLOGY:  Bone against bone in the medial compartment and patellofemoral articulation with significant wear laterally, as well.  The knee is in varus of  about 5 degrees.  DESCRIPTION OF PROCEDURE:  Under general anesthesia, the left leg was prepared ith DuraPrep and draped as a sterile field.  A proximal thigh tourniquet was used sterilely.  The leg was wrapped out with an Esmarch and the tourniquet used at 400 mm.  A midline incision was made that was carried parapatellar medially. Multiple small vessels were cauterized.  The patella was everted.  The femur was sized to the standard.  Proximal tibial resection was done using the first femoral guide.  An intercondylar drill hole was made using the second guide.  The anterior and posterior flare of the distal femur were resected with a 20 mm flexion gap.  Using the intermedullary guide, a distal femoral cut was made with a 20 mm extension gap.  The recessing guide was then used.  The lamina spreader was then inserted and remnants of the PCL and menisci and spurs in the back of the femoral condyles including a large loose body were all removed.  Once this was completed, the proximal tibia was exposed.  The center hole was placed.  Trial reduction of the standard plus tibia, 20 mm rotating platform bearing and standard femur revealed excellent fit, alignment and stability.  The patella was then osteotomized and three peg holes were placed.   The bony surfaces were then prepared with pulse irrigation and antibiotic solution.  Permanent components were then cemented in  place.  When the cement had hardened, excess was removed.  Full synovectomy of he suprapatellar pouch was carried out.  The tourniquet was let down at about 48 minutes.  Multiple small vessels were cauterized.  The wound was then closed in  layers with #1 Tycron, 0 Vicryl and 2-0 Vicryl.  Skin clips were applied.  The nee was then infiltrated with morphine and Marcaine with epinephrine.  A sterile dressing and thigh-high TED hose were applied.  The patient returned to recovery in good condition.  OPERATIVE TIME:  Approximately 1 hour and 10 minutes. DD:  11/17/99 TD:  11/17/99 Job: [id number]

## 2010-12-17 NOTE — H&P (Signed)
[place]  Patient:    [name], [name]                   MRN: [id number] Adm. Date:  11/17/99 Attending:  Jonny Ruiz L. Dorothyann Gibbs, M.D. Dictator:   Arnoldo Morale, P.A.                         History and Physical  DATE OF BIRTH:  03-10-1923.  CHIEF COMPLAINT:  Progressively worsening left knee pain since 1993.  HISTORY OF PRESENT ILLNESS:  This 77 year old white female presented to Dr. Jonny Ruiz L. Rendall III with a history of progressively worsening left knee pain since 1993.  She has a history of a right total knee arthroplasty in 1993. Over the last year, the pain in her left knee has gotten progressively worse, to the  point where it now fairly extreme.  Pain is located diffusely about the joint, ith radiation up into her hip and back and down to her left ankle.  It is intermittent. It is described as an aching pain which becomes sharp at times.  It increases with prolonged standing still or walking or weightbearing.  She has difficulty going  from a sitting to standing position.  Nothing really seems to alleviate it at this time.  She does have a fair amount of swelling, popping, catching, grinding and  locking of the knee.  She has also been ambulating with the use of a four-prong  cane for about the last two years.  She is currently taking Tylenol p.r.n. for he pain and that provides minimal relief.  ALLERGIES:  PHENERGAN causes extreme sedation.  CURRENT MEDICATIONS:  1. Indapamide 1.25 mg one tablet p.o. q.d.  2. Synthroid 0.125 mg one tablet p.o. q.d.  3. Zantac 150 mg one tablet p.o. b.i.d.  4. Glucotrol 10 mg one tablet p.o. b.i.d.  5. Glucophage 500 mg one tablet p.o. b.i.d.  6. Verapamil SR 240 mg one tablet p.o. q.d.  7. Lipitor 10 mg one tablet p.o. q.d.  8. Propranolol 10 mg one tablet p.o. q.i.d. p.r.n. palpitations.  9. Premarin cream 0.5 g applied vaginally q.h.s. 10. Metro cream 0.75% applied to her face  sparingly b.i.d.  PAST MEDICAL HISTORY:  She has type 2 non-insulin-dependent diabetes mellitus, since 1989.  She was diagnosed with hypertension about 18 years ago.  She has had hypothyroidism for about the last 30 years.  She does have a history of a hiatal hernia and gastroesophageal reflux disease for about 10 years.  She was diagnosed with hypercholesterolemia and hypertriglyceridemia since February of this year.  She had been treated for rosacea for the last eight to nine years.  She does have a history of intermittent tachycardia since February of 2001 and she has a history of venous insufficiency.  She was diagnosed and treated for cervical cancer in 1963. She denies any history of asthma or any other chronic medical condition other than noted previously.  PAST SURGICAL HISTORY:  1. Appendectomy in 1941 by Dr. Mertie Moores.  2. Conization and a D&C by Dr. Cherly Anderson. Burtling in 1963.  3. Partial hysterectomy by Dr. Cherly Anderson. Burtling in 1963.  4. I&D of a thrombosed hemorrhoid in 1977 by Dr. Lyndel Safe.  5. Repair of a cystocele and rectocele by Dr. Esmeralda Arthur in 1978.  6. Vaginal biopsy in 1980 by Dr. Kristine Royal. Smith.  7. Laser treatment for vaginal dysplasia in 1981 and 1982  by Dr. Madelynn Done.  8. Right total knee arthroplasty by Dr. Jonny Ruiz L. Rendall in 1993.  SOCIAL HISTORY:  She denies any history of alcohol use or drug use.  She did smoke about a half a pack a day for about 25 years but quit 40 years ago.  She is married and has three sons who range in age from 24, 42 and 31.  She and her husband live in a one-story house with three steps into the main entrance.  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  Her medical doctor is Dr. Fayrene Fearing D. Kindl.  FAMILY HISTORY:  Her mother died at the age of 33 with a history of a rapid heart beat and leukemia.  Her father died at the age of 53 with coronary artery disease and probable Alzheimers.  She  has one brother who is alive at age 83 with a history of heart disease and diabetes mellitus.  She had one brother who died at age 69 with thyroid disease, myocardial infarction and an aneurysm.  She had one sister who died at age 96 with a history of emphysema, heart disease, hypertension and thyroid disease.  REVIEW OF SYSTEMS:  She does have a history of an inner ear problem, with vertigo intermittently; this has been treated with Antivert in the past by Dr. Artis Flock. he does have occasional near-fainting spells which she describes as upper chest and left arm pain and a choking sensation in her throat.  This has been investigated and she was hospitalized for it in February of 2001.  They believe it is being caused by her hiatal hernia.  She does have an occasional episode of tinnitus. She does have upper sinus congestion every morning.  She does complain of a stiff neck intermittently and believes she may have some sleep apnea, which is untreated. She did have asthma as a child but none now.  She does get dyspneic on exertion. She does have frequent nausea due to the multiple medications she is taking.  She reports some chronic constipation with her having a bowel movement only every four to five days.  She treats this with an occasional glycerin suppository.  She does have urinary frequency and does wear incontinence pads.  She believes she does ave some shoulder bursitis on the right side and history of arthritis everywhere. he does have glasses but no hearing aid or dentures.  All other systems are negative and noncontributory.  PHYSICAL EXAMINATION:  GENERAL:  This is a well-developed, well-nourished, moderately overweight white  female in no acute distress.  Does walk with a slight limp on the left.  Height is 5 feet 2 inches; weight 227 pounds.  Mood and affect are appropriate.  She talks easily with the examiner.  VITAL SIGNS:  Temperature 98.2 degrees  Fahrenheit, pulse 84, respirations 14 and BP 116/56.  HEENT:  Normocephalic, atraumatic ______ no maxillary sinus tenderness to palpation.  Conjunctivae pink.  Sclerae nonicteric.  PERRLA.  EOMs intact.  Funduscopic exam shows visible red reflexes bilaterally with normal retinal vasculature, optic disks and cups.  No visible external ear deformities. Hearing grossly intact.  Tympanic membranes pearly gray bilaterally with good light reflex. Nose and nasal septum midline.  Nasal mucosa pink and moist without exudates or  polyps noted.  Buccal mucosa pink and moist.  Good dentition.  Pharynx without erythema or exudates.  Tongue and uvula midline.  Tongue without fasciculations and uvula rises equally with phonation.  NECK:  No  visible masses or lesions noted.  Trachea midline.  No palpable lymphadenopathy nor thyromegaly.  Carotids +2 bilaterally.  No bruits.  Full range of motion of her cervical spine without pain and nontender to palpation.  CARDIOVASCULAR:  Heart rate and rhythm are regular.  S1 and S2 present, without  rubs, clicks or murmurs noted.  RESPIRATORY:  Respirations even and unlabored.  Breath sounds clear to auscultation bilaterally, without rales or wheezes noted.  ABDOMEN:  Rounded abdominal contour.  Bowel sounds present x 4 quadrants. Soft, nontender to palpation, without hepatosplenomegaly nor CVA tenderness. Well-healed midline incision line.  BREASTS:  No visible masses or lesions noted.  GU/RECTAL/PELVIC:  These exams deferred at this time.  MUSCULOSKELETAL:  No obvious deformities of her upper extremities with full range of motion in these extremities without pain.  Radial pulses are +2 bilaterally.  She has a well-healed incision noted about the right knee.  No other obvious deformities of her lower extremities.  Full range of motion of her hips, ankles and toes bilaterally.  She is able to flex the right knee to about 100 degrees with  full  extension.  No crepitus with range of motion of the right knee.  Nontender to palpation along the joint line and no effusion.  Left knee does not appear swollen compared to the right.  She is able to flex the left knee to about 100 degrees ith full extension.  She has a moderate amount of crepitus with range of motion of he left knee.  She is tender, especially on the medial joint line of the left knee, and also on the lateral joint line.  DP pulses are +2 bilaterally.  NEUROLOGIC:  Alert and oriented x 3.  Cranial nerves II-XII grossly intact. Strength 5/5, bilateral upper and lower extremities.  Rapid alternating movements intact.  Sensation intact.  Deep tendon reflexes +2 bilaterally.  RADIOLOGIC FINDINGS:  X-rays taken of her left knee in August of 1999 showed bone-against-bone contact in the medial compartment of her knee, with spurs about the patella and a varus deformity at that time of about 7 degrees.  IMPRESSION:  1. End-stage osteoarthritis of the left knee.  2. Status post right total knee arthroplasty.  3. Type 2 diabetes mellitus.  4. Hypertension.  5. Hypothyroidism.  6. Hiatal hernia.  7. Gastroesophageal reflux disease.  8. Hypercholesterolemia.  9. Hypertriglyceridemia. 10. Rosacea. 11. History of tachycardia. 12. History of peripheral vascular disease. 13. History of cervical cancer.  PLAN:  Ms. Padgett will be admitted to [facility] on [facility] 17, 1999 where she will undergo a left total knee arthroplasty by Dr. Jonny Ruiz L. Rendall.  She will undergo all the routine preoperative laboratory tests and studies prior to this  procedure.  We do have a note from Dr. Colleen Can. Tennants office saying she has been cleared by him for surgery and she is having more blood work at his office  prior to surgery. DD:  11/11/99 TD:  11/11/99 Job: [id number]

## 2010-12-17 NOTE — Telephone Encounter (Signed)
Ok with me if ok with pt  Form in IN box

## 2010-12-17 NOTE — Discharge Summary (Signed)
[name], KENDRICK NO.:  [id number]   MEDICAL RECORD NO.:  [id number]          PATIENT TYPE:  INP   LOCATION:  3009                         FACILITY:  [facility]   PHYSICIAN:  Kathaleen Maser. Pool, M.D.    DATE OF BIRTH:  12-15-1922   DATE OF ADMISSION:  05/02/2005  DATE OF DISCHARGE:  05/06/2005                                 DISCHARGE SUMMARY   FINAL DIAGNOSES:  1.  Spinal stenosis.  2.  Right fibular fracture.   HISTORY OF PRESENT ILLNESS:  [name] is an 77 year old morbidly obese  female who was evaluated at the [facility] Emergency Room for  complaints of back pain. In the process of the evaluation, the patient  suffered a fall with resultant fractured ankle. She is initially admitted to  the orthopedic service. She is now being transferred for management of her  symptomatic lumbar stenosis.   HOSPITAL COURSE:  The patient's lumbar pain and lower extremity pain  gradually improved while as an inpatient on bedrest. The patients workup  demonstrated a rightward L2-3 disc herniation, severe disk degeneration with  degenerative spondylolisthesis at L4-5 without resultant stenosis and  degenerative spondylolisthesis with stenosis at L5-S1. Given her improvement  with rest, it was elected to not proceed aggressively with any type of  aggressive surgical intervention. The patient was given a trial epidural  steroid injections with good improvement. She was felt to be a good  candidate for inpatient rehabilitation and is being transferred to the  rehabilitation service.   CONDITION ON DISCHARGE:  Improved.   DISCHARGE DISPOSITION:  The patient will be discharged to the rehab service.  She will follow-up back with me in one month.   CURRENT CONDITION:  The patient is ambulatory with assistance. Her pain is  much improved.           ______________________________  Kathaleen Maser Pool, M.D.     HAP/MEDQ  D:  08/09/2005  T:  08/09/2005  Job:  [id number]

## 2010-12-17 NOTE — Discharge Summary (Signed)
NAME:  [name]                       ACCOUNT NO.:  [id number]   MEDICAL RECORD NO.:  [id number]                   PATIENT TYPE:  INP   LOCATION:  3729                                 FACILITY:  [facility]   PHYSICIAN:  Colleen Can. Deborah Chalk, M.D.            DATE OF BIRTH:  [birth year]   DATE OF ADMISSION:  12/10/2002  DATE OF DISCHARGE:                                 DISCHARGE SUMMARY   PRIMARY DISCHARGE DIAGNOSIS:  Atypical chest pain with subsequent elective  cardiac catheterization revealing normal LV function and normal coronary  arteries.   SECONDARY DISCHARGE DIAGNOSES:  1. Morbid obesity.  2. Dyspnea on exertion, most likely weight related.  3. History of SVT with previous refusal for ablation therapy.  4. Hypertensive heart disease.  5. Diabetes.  6. Hypothyroidism.  7. Hiatal hernia.   HISTORY OF PRESENT ILLNESS:  The patient is a 77 year old female who has  multiple medical problems who was referred to the emergency room from her  primary care doctor with atypical chest pain and dyspnea on exertion. Her  weight had been up approximately seven pounds over the last three days. She  had had significant dietary indiscretion. She recently had an abnormal  Cardiolite in the office and was referred for admission with elective  cardiac catheterization. Please see dictated history and physical for  further patient presentation and profile.   DIAGNOSTIC IMPRESSION:  On admission, EKG showed normal sinus rhythm with  first-degree AV block.   LABORATORY DATA:  B-peptide was 222. CBC normal. CK and troponin's were  negative. Chemistries were normal except for a potassium of 3.3.   HOSPITAL COURSE:  The patient was admitted. She was started on IV heparin.  Topical nitrates were added. We planned to proceed on with cardiac  catheterization the following day. That procedure was performed without any  known complication. LV function was normal. The coronaries were normal. She  was unable to have Perclose. Post procedure, she was transferred to the  outpatient area and plans were now made for her to be discharged with  further evaluation to include outpatient 2-D echocardiogram as well as  outpatient spiral CT of the chest to rule out  pulmonary emboli.   CONDITION ON DISCHARGE:  Stable.   DISCHARGE MEDICATIONS:  She will resume all of her previous medications that  she was taking before.    SPECIAL INSTRUCTIONS:  Will arrange for 2-D echocardiogram of the heart this  Friday at 1:00 p.m. Spiral CT of the chest will be arranged as an outpatient  for next week. She is to call if any problems arise in the interim.     Juanell Fairly C. Earl Gala, N.P.                 Colleen Can. Deborah Chalk, M.D.    LCO/MEDQ  D:  12/11/2002  T:  12/11/2002  Job:  [id number]   cc:   Lauralee Evener, M.D.

## 2010-12-17 NOTE — Consult Note (Signed)
NAMETYEESHA, RIKER NO.:  [id number]   MEDICAL RECORD NO.:  [id number]          PATIENT TYPE:  INP   LOCATION:  1504                         FACILITY:  [facility]   PHYSICIAN:  Donalee Citrin, M.D.        DATE OF BIRTH:  May 13, 1923   DATE OF CONSULTATION:  05/01/2005  DATE OF DISCHARGE:                                   CONSULTATION   REASON FOR CONSULTATION:  Chronic low back pain with right leg  radiculopathy.   REFERRING PHYSICIAN:  Thereasa Distance A. Chaney Malling, M.D.   HISTORY OF PRESENT ILLNESS:  The patient is a very pleasant 77 year old  female who has had longstanding chronic low back pain that has gotten  acutely worse recently. The patient has been seen by Dr. Priscille Kluver who ordered  a followup MRI scan and she had been complaining of occasional numbness in  her toes that may be related to her diabetic neuropathy as well as pain that  radiated to the right hip down the outside of the right leg, occasionally  down to her ankle but oftentimes over across to her groin, staying in the  lateral thigh.  She also has collateral thigh numbness in the left side is  oftentimes associated with her hypoglycemic deficit.  This has been  refractory to anti-inflammatory pain medication as of late and been  progressively worse with the hip pain and the way she has become  nonambulatory.  She denies any bowel or bladder complaints.  Denies any  persistent numbing or tingling other than what she experiences in her toes.   PAST MEDICAL HISTORY:  1.  Diabetes.  2.  Hypertension.  3.  Reflux.  4.  Hyperglycemia.  5.  Hypothyroidism.  6.  Coronary artery disease with cardiac dysrhythmia followed by Dr. Roger Shelter.  7.  She has had bilateral total knee arthroscopies.  8.  She has had a laparoscopic cholecystectomy.  9.  Appendectomy.  10. Hysterectomy.  11. Cystocele, rectocele resection.  12. Cataract surgery.   HABITS:  She denies any smoking, alcohol or drug  consumption.   ALLERGIES:  PHENERGAN, REGLAN, NSAID.   CURRENT MEDICATIONS:  __________, __________, Econopred, Synthroid,  Indapamide, omeprazole, glipizide, Actos, metformin, verapamil, Accutrol,  propranolol, Lasix, potassium, Lipitor, Mobic, and Percocet.   PHYSICAL EXAMINATION:  GENERAL:  The patient is an awake, alert, 77 year old  female in no acute distress.  HEENT:  Within normal limits.  __________ pain limited but seems to be full strength, break-away weakness  in the iliopsoas, quads, hamstrings, gastrocs, and EHL.  She has decreased  reflexes in knee jerks and ankle jerks.  She has decreased sensation in her  toes and slightly on the left lateral thigh.   LABORATORY DATA:  Her MRI scan shows multilevel degenerative disease at L1-  2, L2-3, L3-4, L4-5, L5-S1.  In L5-S1 she has degenerative spondylolisthesis  causing some mild to moderate spinal stenosis.  Most significant finding, I  think is L2-3 with a large broad-based disk bulge and herniation at both L3  nerves, worse on the right.   At  this level, fragments seem to have migrated caudally behind the  __________ body.  I do think this is a symptomatic level giving her a 3 and  a 2 radiculopathy that occasionally will radiate across the groin down the  outside of her thigh. However, she has requested Dr. Jordan Likes to manage her case  as he operated on her daughter.  He is out of town until Monday evening.  I  will tell him of her admission and he will evaluate the MRI and come up with  a disposition.  She will need cardiac clearance for any procedure, so I  would recommend proceeding forward with this per Dr. Roger Shelter and we  will follow along during her hospitalization.   Thank you for the consultation.           ______________________________  Donalee Citrin, M.D.     GC/MEDQ  D:  05/01/2005  T:  05/02/2005  Job:  [id number]

## 2010-12-17 NOTE — Discharge Summary (Signed)
NAMEIDELLE, REIMANN NO.:  [id number]   MEDICAL RECORD NO.:  [id number]          PATIENT TYPE:  INP   LOCATION:  2023                         FACILITY:  [facility]   PHYSICIAN:  Sharlee Blew, N.P.  DATE OF BIRTH:  1923/02/23   DATE OF ADMISSION:  02/03/2006  DATE OF DISCHARGE:  02/07/2006                                 DISCHARGE SUMMARY   PRIMARY DISCHARGE DIAGNOSIS:  1.  Atrial fibrillation with subsequent conversion to sinus rhythm.  2.  History of supraventricular tachycardia.  3.  Known left ventricular hypertrophy.  4.  Past history of cardiac catheterization showing normal coronaries.  5.  Morbid obesity.  6.  Gastroesophageal reflux disease.  7.  Diabetes.  8.  Hypertension.  9.  Hyperlipidemia.   HISTORY OF PRESENT ILLNESS:  The patient is a 77 year old white female.  She  has multiple medical problems.  She was admitted to [facility] on  February 03, 2006 with a new onset of atrial fibrillation.  She has had a past  history of cardiac catheterization in May of 2004 which showed normal  coronary arteries and normal left ventricular function.  She has had a past  history of supraventricular tachycardia and has refused ablation in the  past.  She has been managed medically.   She presents to the emergency room department with the onset of chest  discomfort and shortness of breath.  The discomfort was located in the upper  anterior chest as well as to her throat and it was associated with dyspnea.  However, no diaphoresis, no nausea, no vomiting.  She did not really note a  rapid or irregular heart beat.  When she was seen in the emergency room, she  was found to be in atrial fibrillation with a moderately rapid ventricular  response.  She was subsequently admitted for further evaluation.   Please see the dictated history and physical, per Dr. Ulyses Amor,  for further patient presentation and profile.   LABORATORY DATA ON ADMISSION:  EKG  showed atrial fibrillation.  Chest x-ray  showed no acute disease.  Chest CT showed no evidence of pulmonary emboli.  Cardiac enzymes were negative.  CBC showed a white count of 7.1, hemoglobin  of 11, hematocrit of 34, potassium of 3.9, BUN 22, creatine 1.2.   HOSPITAL COURSE:  The patient was admitted electively.  She was placed on IV  heparin.  Her home medicines were continued.  Coumadin was also initiated.  On February 06, 2006, she was basically doing well.  She remained in atrial  fibrillation.  Initially, our goal was rate control with placement of  anticoagulation with plans for probable out patient cardioversion.  She was  subsequently started on Lopressor for improved rate control.  A 2-D echo  cardiogram was obtained.  She subsequently converted to sinus bradycardia on  the morning of February 06, 2006.   It will be our plans now for her to be on Coumadin for 1 month.  She will  continue with Lopressor and verapamil.  She has had some problems with  increasing lower extremity  edema, questionably related to Actos and has been  treated with diuretic therapy.  Apparently, in the past, she has been very  hesitant to take diuretics due to significant urinary incontinence.   Her activity has been increased and tolerated without problems.  She has had  chronic complaints of dizziness and headaches and currently has a CT scan of  the head pending with plans for neurological evaluation later this month  that had previously been arranged.  She is noted to have some mild degree of  anemia and her proton pump inhibitor has been increased accordingly.  There  have been no stools to guaiac.   LABORATORY DATA AT DISCHARGE:  Shows a BMP of 145, INR is 1.3, hemoglobin is  11, hematocrit is 33, BUN is 14, creatine is .9, potassium is 3.4 with a  glucose of 111.   DISCHARGE CONDITION:  Stable.   DISCHARGE MEDICATIONS:  Coumadin 500mg  each evening, omeprazole 20mg  b.i.d.,  coated aspirin 325mg   daily, verapamil 240 a day, simvastatin 10mg  each  evening, metformin 500 b.i.d., Actos 15 b.i.d.,  indapamide 2.5 daily,  Glucotrol XL 10mg  two times a day, Synthroid 150 a day, Lasix 40 a day, K-  Dur . a day, Lopressor 25 b.i.d., and Tylenol and hydrocodone as  needed.   Will plan on checking her first proton this coming Friday on February 10, 2006,  and will continue Coumadin for one month.   Will have a event monitor with auto trigger for atrial fibrillation placed  at the time of discharge today and otherwise, will plan on seeing her back  in the office in 7-10 days.  She is asked to call to schedule that  appointment.  She is questioning her current dose of Actos and will defer  this to primary care.   Her diet is to be diabetic with no extra salt and her activity is to be as  tolerated.  She is to call if any problems arise in the interim.      Sharlee Blew, N.P.     LC/MEDQ  D:  02/07/2006  T:  02/07/2006  Job:  [id number]   cc:   Sharlee Blew, N.P.   Varney Baas, MD

## 2010-12-22 ENCOUNTER — Ambulatory Visit: Payer: Self-pay | Admitting: Cardiology

## 2010-12-24 ENCOUNTER — Ambulatory Visit (INDEPENDENT_AMBULATORY_CARE_PROVIDER_SITE_OTHER): Payer: Medicare Other | Admitting: Family Medicine

## 2010-12-24 DIAGNOSIS — Z5181 Encounter for therapeutic drug level monitoring: Secondary | ICD-10-CM

## 2010-12-24 DIAGNOSIS — Z7901 Long term (current) use of anticoagulants: Secondary | ICD-10-CM

## 2010-12-24 DIAGNOSIS — I4891 Unspecified atrial fibrillation: Secondary | ICD-10-CM

## 2011-01-10 ENCOUNTER — Other Ambulatory Visit: Payer: Self-pay | Admitting: *Deleted

## 2011-01-10 MED ORDER — FUROSEMIDE 20 MG PO TABS: 20.0000 mg | ORAL_TABLET | Freq: Every day | ORAL | Status: DC

## 2011-01-10 MED ORDER — WARFARIN SODIUM 5 MG PO TABS: ORAL_TABLET | ORAL | Status: DC

## 2011-01-10 MED ORDER — POTASSIUM BICARB-CITRIC ACID 20 MEQ PO TBEF: EFFERVESCENT_TABLET | ORAL | Status: DC

## 2011-01-18 ENCOUNTER — Ambulatory Visit: Payer: MEDICARE | Admitting: Family Medicine

## 2011-01-19 ENCOUNTER — Other Ambulatory Visit: Payer: MEDICARE

## 2011-01-19 MED ORDER — POTASSIUM CHLORIDE CRYS ER 20 MEQ PO TBCR: 20.0000 meq | EXTENDED_RELEASE_TABLET | Freq: Two times a day (BID) | ORAL | Status: DC

## 2011-01-19 NOTE — Telephone Encounter (Signed)
Addended by: Jobie Quaker on: 01/19/2011 09:26 AM   Modules accepted: Orders

## 2011-01-19 NOTE — Telephone Encounter (Signed)
Addended by: Jobie Quaker on: 01/19/2011 09:37 AM   Modules accepted: Orders

## 2011-01-21 ENCOUNTER — Ambulatory Visit (INDEPENDENT_AMBULATORY_CARE_PROVIDER_SITE_OTHER): Payer: Medicare Other | Admitting: Internal Medicine

## 2011-01-21 ENCOUNTER — Other Ambulatory Visit: Payer: Medicare Other

## 2011-01-21 DIAGNOSIS — R5383 Other fatigue: Secondary | ICD-10-CM

## 2011-01-21 DIAGNOSIS — E039 Hypothyroidism, unspecified: Secondary | ICD-10-CM

## 2011-01-21 DIAGNOSIS — Z79899 Other long term (current) drug therapy: Secondary | ICD-10-CM

## 2011-01-21 DIAGNOSIS — E785 Hyperlipidemia, unspecified: Secondary | ICD-10-CM

## 2011-01-21 DIAGNOSIS — E119 Type 2 diabetes mellitus without complications: Secondary | ICD-10-CM

## 2011-01-21 DIAGNOSIS — E559 Vitamin D deficiency, unspecified: Secondary | ICD-10-CM

## 2011-01-21 DIAGNOSIS — M810 Age-related osteoporosis without current pathological fracture: Secondary | ICD-10-CM

## 2011-01-21 DIAGNOSIS — I10 Essential (primary) hypertension: Secondary | ICD-10-CM

## 2011-01-21 DIAGNOSIS — Z7901 Long term (current) use of anticoagulants: Secondary | ICD-10-CM

## 2011-01-21 DIAGNOSIS — Z5181 Encounter for therapeutic drug level monitoring: Secondary | ICD-10-CM

## 2011-01-21 DIAGNOSIS — I4891 Unspecified atrial fibrillation: Secondary | ICD-10-CM

## 2011-01-21 DIAGNOSIS — R5381 Other malaise: Secondary | ICD-10-CM

## 2011-01-21 LAB — COMPREHENSIVE METABOLIC PANEL
Albumin: 4.1 g/dL (ref 3.5–5.2)
BUN: 27 mg/dL — ABNORMAL HIGH (ref 6–23)
CO2: 31 mEq/L (ref 19–32)
Calcium: 9.8 mg/dL (ref 8.4–10.5)
Chloride: 100 mEq/L (ref 96–112)
GFR: 48.53 mL/min — ABNORMAL LOW (ref >60.00)
Glucose, Bld: 149 mg/dL — ABNORMAL HIGH (ref 70–99)
Potassium: 4.2 mEq/L (ref 3.5–5.1)

## 2011-01-21 LAB — CBC WITH DIFFERENTIAL/PLATELET
Basophils Relative: 1 % (ref 0.0–3.0)
Eosinophils Relative: 2.5 % (ref 0.0–5.0)
Lymphocytes Relative: 25.1 % (ref 12.0–46.0)
Neutrophils Relative %: 60.7 % (ref 43.0–77.0)
RBC: 4.15 Mil/uL (ref 3.87–5.11)
WBC: 7.3 10*3/uL (ref 4.5–10.5)

## 2011-01-21 LAB — LIPID PANEL
Cholesterol: 152 mg/dL (ref 0–200)
LDL Cholesterol: 91 mg/dL (ref 0–99)

## 2011-01-21 LAB — VITAMIN B12: Vitamin B-12: 123 pg/mL — ABNORMAL LOW (ref 211–911)

## 2011-01-21 LAB — TSH: TSH: 0.9 u[IU]/mL (ref 0.35–5.50)

## 2011-01-21 LAB — HEMOGLOBIN A1C: Hgb A1c MFr Bld: 7.7 % — ABNORMAL HIGH (ref 4.6–6.5)

## 2011-01-21 NOTE — Patient Instructions (Signed)
Continue current dose, check in 4 weeks  

## 2011-01-24 ENCOUNTER — Ambulatory Visit: Payer: Medicare Other

## 2011-01-24 ENCOUNTER — Other Ambulatory Visit: Payer: Medicare Other

## 2011-01-26 ENCOUNTER — Telehealth: Payer: Self-pay

## 2011-01-26 ENCOUNTER — Ambulatory Visit: Payer: MEDICARE | Admitting: Family Medicine

## 2011-01-26 NOTE — Telephone Encounter (Signed)
Patient notified as instructed by telephone. Pt said she was not sure what day she could come for B12 injection. She will call back and talk with triage when she can come in.

## 2011-01-26 NOTE — Telephone Encounter (Signed)
Message copied by Patience Musca on Wed Jan 26, 2011 12:16 PM ------      Message from: [name] A      Created: Fri Jan 21, 2011  4:52 PM       Will disc with pt at upcoming follow up       Let her know B12 is low and have her come in for a shot before I see her -thanks

## 2011-01-27 ENCOUNTER — Telehealth: Payer: Self-pay | Admitting: *Deleted

## 2011-01-27 NOTE — Telephone Encounter (Signed)
Opened in error

## 2011-02-03 ENCOUNTER — Ambulatory Visit (INDEPENDENT_AMBULATORY_CARE_PROVIDER_SITE_OTHER): Payer: Medicare Other | Admitting: Family Medicine

## 2011-02-03 DIAGNOSIS — E538 Deficiency of other specified B group vitamins: Secondary | ICD-10-CM

## 2011-02-03 MED ORDER — CYANOCOBALAMIN 1000 MCG/ML IJ SOLN
1000.0000 ug | Freq: Once | INTRAMUSCULAR | Status: AC
  Administered 2011-02-03: 1000 ug via INTRAMUSCULAR

## 2011-02-03 NOTE — Progress Notes (Signed)
  Subjective:    Patient ID: [name], female    DOB: 1923-05-31, 77 y.o.   MRN: [id number]  HPI Here for injection only   Review of Systems     Objective:   Physical Exam        Assessment & Plan:

## 2011-02-04 ENCOUNTER — Ambulatory Visit: Payer: Self-pay | Admitting: Family Medicine

## 2011-02-14 ENCOUNTER — Ambulatory Visit: Payer: Self-pay | Admitting: Family Medicine

## 2011-02-18 ENCOUNTER — Ambulatory Visit (INDEPENDENT_AMBULATORY_CARE_PROVIDER_SITE_OTHER): Payer: Medicare Other | Admitting: Family Medicine

## 2011-02-18 ENCOUNTER — Encounter: Payer: Self-pay | Admitting: Family Medicine

## 2011-02-18 DIAGNOSIS — E538 Deficiency of other specified B group vitamins: Secondary | ICD-10-CM

## 2011-02-18 DIAGNOSIS — E559 Vitamin D deficiency, unspecified: Secondary | ICD-10-CM

## 2011-02-18 DIAGNOSIS — E039 Hypothyroidism, unspecified: Secondary | ICD-10-CM

## 2011-02-18 DIAGNOSIS — Z5181 Encounter for therapeutic drug level monitoring: Secondary | ICD-10-CM

## 2011-02-18 DIAGNOSIS — Z7901 Long term (current) use of anticoagulants: Secondary | ICD-10-CM

## 2011-02-18 DIAGNOSIS — E119 Type 2 diabetes mellitus without complications: Secondary | ICD-10-CM

## 2011-02-18 DIAGNOSIS — E785 Hyperlipidemia, unspecified: Secondary | ICD-10-CM

## 2011-02-18 DIAGNOSIS — I4891 Unspecified atrial fibrillation: Secondary | ICD-10-CM

## 2011-02-18 MED ORDER — CYANOCOBALAMIN 1000 MCG/ML IJ SOLN
1000.0000 ug | Freq: Once | INTRAMUSCULAR | Status: AC
  Administered 2011-02-18: 1000 ug via INTRAMUSCULAR

## 2011-02-18 MED ORDER — ERGOCALCIFEROL 1.25 MG (50000 UT) PO CAPS: 50000.0000 [IU] | ORAL_CAPSULE | ORAL | Status: DC

## 2011-02-18 NOTE — Assessment & Plan Note (Signed)
tsh stable - rev with pt

## 2011-02-18 NOTE — Assessment & Plan Note (Signed)
Good control statin and diet Pt does not think statin worsens her pain

## 2011-02-18 NOTE — Assessment & Plan Note (Signed)
Another 12 week course since level did not imp much Disc imp to bone and overall health

## 2011-02-18 NOTE — Progress Notes (Signed)
Subjective:    Patient ID: [name], female    DOB: Dec 12, 1922, 77 y.o.   MRN: [id number]  HPI Here for f/u of thyroid/ lipid/ DM and B12 def (new ) and D def  Also has ha and dizziness and nauseated  Has been dizzy on and off for 3-4 weeks  This is chronic inner ear problem - it comes and goes and she has seen neurology for it  Doing exercises for vertigo -- and not working well  Symptoms happen when she lies down or gets up - otherwise settles down  Headache is new - started in the back of her head  Some pain where she had bump to the head - that is chronic for a long time ! No motivation and is generally tired - does not want to go out - sits all day  Thinks she may need to go back to Dr Anne Hahn if this does not improve   Has had B12 def before - level 123 in June and has had one shot   Chronic pain makes her homebound - but she does not want home health care yet  Can bathe herself just barely at this point   B12 is very low 123 Got one shot after labs and gets one today Then will plan 4 more for 4 weeks and re check level Pt hopes this will help her energy level  a1c came down a bit - confesses to little appetite but not eating well- fast food because she cannot prepare it  Is not very concerned about diabetes  No increased thirst or urination  Vit D level still quite low in 20s- she is willing to take another 12 week high dose course of that   Patient Active Problem List  Diagnoses  . ADENOMATOUS COLONIC POLYP  . HYPOTHYROIDISM  . DIABETES MELLITUS, TYPE II  . HYPERLIPIDEMIA  . HYPERTENSION  . ATRIAL FIBRILLATION  . CHRONIC RHINITIS  . GERD  . HIATAL HERNIA  . IRRITABLE BOWEL SYNDROME  . ROSACEA  . INTERTRIGO  . OSTEOPOROSIS  . UNSPECIFIED VITAMIN D DEFICIENCY  . Lump in the abdomen  . Fatigue  . B12 deficiency   Past Medical History  Diagnosis Date  . Atrial fibrillation   . Diabetes mellitus     type II  . Anemia     Fe deficiency  . GERD  (gastroesophageal reflux disease)   . Hyperlipidemia   . Hypertension   . Hypothyroid   . Obesity   . Vaginal dysplasia 1980  . Rosacea   . Chest pain 11/2002    cardiac cath neg   . Back pain   . IBS (irritable bowel syndrome)     with diarrhea  . Asthma     as a child  . Adenomatous colon polyp 12/2007  . Hemorrhoids   . Vertigo    Past Surgical History  Procedure Date  . Abdominal hysterectomy 1963  . Cholecystectomy 2006  . Joint replacement     Rt TKR 1993 & Lt TKR 2001  . Cervical cone biopsy 1963    D&C  . Appendectomy 1941  . Cystocele repair 1978    /rectocele  . Orif ankle fracture 01/2010    fall/followed by rehab stay   History  Substance Use Topics  . Smoking status: Former Games developer  . Smokeless tobacco: Not on file  . Alcohol Use: No   Family History  Problem Relation Age of Onset  . Cancer Mother  leukemia  . Coronary artery disease Mother   . Dementia Mother   . Heart disease Father     CAD  . COPD Sister   . Heart disease Brother   . Diabetes Brother   . Kidney disease Son    Allergies  Allergen Reactions  . Metoclopramide Hcl     REACTION: unspecified  . Nsaids     REACTION: unspecified  . Promethazine Hcl     REACTION: unspecified   Current Outpatient Prescriptions on File Prior to Visit  Medication Sig Dispense Refill  . acetaminophen (TYLENOL) 500 MG tablet OTC as directed.       . furosemide (LASIX) 20 MG tablet Take 1 tablet (20 mg total) by mouth daily.  90 tablet  3  . glucose blood test strip Check blood sugar two times a day for DM 250.0 and as needed.       . indapamide (LOZOL) 2.5 MG tablet Take 2.5 mg by mouth daily.        . insulin glargine (LANTUS SOLOSTAR) 100 UNIT/ML injection Inject 60 Units into the skin every evening.        . Insulin Pen Needle 29G X 12.7MM MISC Use as directed with insulin injections.       Marland Kitchen levothyroxine (SYNTHROID, LEVOTHROID) 150 MCG tablet Take 150 mcg by mouth daily.        .  methocarbamol (ROBAXIN) 500 MG tablet 1 tablet in AM and 1 tablet in PM as needed.       . metoprolol (LOPRESSOR) 50 MG tablet Take 25 mg by mouth 2 (two) times daily.        Marland Kitchen omeprazole (PRILOSEC) 20 MG capsule Take 20 mg by mouth daily.        . potassium chloride SA (K-DUR,KLOR-CON) 20 MEQ tablet Take 1 tablet (20 mEq total) by mouth 2 (two) times daily.  180 tablet  3  . simvastatin (ZOCOR) 40 MG tablet Take 1 tablet (40 mg total) by mouth at bedtime.  90 tablet  3  . traMADol (ULTRAM) 50 MG tablet Take 50 mg by mouth every 8 (eight) hours as needed.        . warfarin (COUMADIN) 5 MG tablet As directed.  90 tablet  3  . ferrous sulfate 325 (65 FE) MG tablet Take 325 mg by mouth daily with breakfast.        . HYDROcodone-acetaminophen (VICODIN ES) 7.5-750 MG per tablet Take 1 tablet by mouth every 6 (six) hours as needed.        . loperamide (IMODIUM A-D) 2 MG tablet OTC as directed.              Review of Systems Review of Systems  Constitutional: Negative for fever, appetite change, fatigue and unexpected weight change.  Eyes: Negative for pain and visual disturbance.  Respiratory: Negative for cough and shortness of breath.   Cardiovascular: Negative.for palitations / cp/ sob   Gastrointestinal: Negative for nausea, diarrhea and constipation.  Genitourinary: Negative for urgency and frequency.  Skin: Negative for pallor. or rash  Neurological: Negative for weakness, light-, numbness and pos for dizziness and headache (in area she injured remotely) Hematological: Negative for adenopathy. Does not bruise/bleed easily.  Psychiatric/Behavioral: Negative for dysphoric mood. Some anxiety at times         Objective:   Physical Exam  Constitutional: She appears well-developed and well-nourished. No distress.  HENT:  Head: Normocephalic and atraumatic.  Mouth/Throat: No oropharyngeal exudate.  Eyes: Conjunctivae  and EOM are normal. Pupils are equal, round, and reactive to light.         2-3 beats of horizontal nystagmus bilaterally  Neck: Normal range of motion. Neck supple. No JVD present. Carotid bruit is not present. No thyromegaly present.  Cardiovascular: Normal rate, regular rhythm, normal heart sounds and intact distal pulses.   Pulmonary/Chest: Effort normal and breath sounds normal. No respiratory distress. She has no wheezes.  Abdominal: Soft. Bowel sounds are normal. She exhibits no distension and no mass. There is no tenderness.  Musculoskeletal: Normal range of motion. She exhibits edema. She exhibits no tenderness.       Trace edema pedal  Lymphadenopathy:    She has no cervical adenopathy.  Neurological: She is alert. She has normal strength and normal reflexes. No cranial nerve deficit or sensory deficit. She exhibits normal muscle tone. Coordination normal.       In wheelchair - unsteady on her feet due to vertigo today  Although not actively dizzy in office - was hesitant to get up  Skin: Skin is warm and dry. No rash noted. No erythema. No pallor.  Psychiatric: She has a normal mood and affect.       Seems mildly down - many complaints Overall pleasant and alert and talkative          Assessment & Plan:

## 2011-02-18 NOTE — Assessment & Plan Note (Signed)
a1c improved but bad diet  Disc briefly , will rev at f/u further since pt was very late for her appt today

## 2011-02-18 NOTE — Assessment & Plan Note (Signed)
Level of 123 Likely causing fatigue and some neuro sympt Shot today Then one weekly for 4 weeks Then f/u to check level

## 2011-02-18 NOTE — Patient Instructions (Signed)
Schedule 4 appts for B12 shots (nurse visits) -- one shot per week for 4 weeks Follow up with me in 5-6 weeks Watch diet as closely as you can  If dizziness worsens we need to get you back to the neurologist  If you feel you need home health care- let me know  Take the 12 week vitamin D course again

## 2011-02-18 NOTE — Patient Instructions (Signed)
Hold today then 2.5 mg daily, recheck monday

## 2011-02-21 ENCOUNTER — Ambulatory Visit (INDEPENDENT_AMBULATORY_CARE_PROVIDER_SITE_OTHER): Payer: Medicare Other | Admitting: Family Medicine

## 2011-02-21 DIAGNOSIS — Z5181 Encounter for therapeutic drug level monitoring: Secondary | ICD-10-CM

## 2011-02-21 DIAGNOSIS — Z7901 Long term (current) use of anticoagulants: Secondary | ICD-10-CM

## 2011-02-21 DIAGNOSIS — I4891 Unspecified atrial fibrillation: Secondary | ICD-10-CM

## 2011-02-21 NOTE — Patient Instructions (Signed)
2.5 mg daily,recheck in 9 days

## 2011-03-01 ENCOUNTER — Telehealth: Payer: Self-pay | Admitting: Gastroenterology

## 2011-03-01 NOTE — Telephone Encounter (Signed)
Patient c/o severe diarrhea.  She reports that she has a large hemorrhoid that is aggravated by diarrhea.  Her primary care MD stopped her metformin because of the diarrhea, but this hasn't improved anything.  If she takes 2-3 imodium a day that does help.  She will come in and see Dr Russella Dar on 03/03/11 9:30.

## 2011-03-02 ENCOUNTER — Ambulatory Visit (INDEPENDENT_AMBULATORY_CARE_PROVIDER_SITE_OTHER): Payer: Medicare Other | Admitting: Family Medicine

## 2011-03-02 DIAGNOSIS — E538 Deficiency of other specified B group vitamins: Secondary | ICD-10-CM

## 2011-03-02 DIAGNOSIS — I4891 Unspecified atrial fibrillation: Secondary | ICD-10-CM

## 2011-03-02 DIAGNOSIS — Z5181 Encounter for therapeutic drug level monitoring: Secondary | ICD-10-CM

## 2011-03-02 DIAGNOSIS — Z7901 Long term (current) use of anticoagulants: Secondary | ICD-10-CM

## 2011-03-02 MED ORDER — CYANOCOBALAMIN 1000 MCG/ML IJ SOLN
1000.0000 ug | Freq: Once | INTRAMUSCULAR | Status: AC
  Administered 2011-03-02: 1000 ug via INTRAMUSCULAR

## 2011-03-02 NOTE — Patient Instructions (Signed)
Continue current dose, check in 2 weeks  

## 2011-03-03 ENCOUNTER — Encounter: Payer: Self-pay | Admitting: Gastroenterology

## 2011-03-03 ENCOUNTER — Other Ambulatory Visit: Payer: Medicare Other

## 2011-03-03 ENCOUNTER — Ambulatory Visit (INDEPENDENT_AMBULATORY_CARE_PROVIDER_SITE_OTHER): Payer: Medicare Other | Admitting: Gastroenterology

## 2011-03-03 VITALS — BP 128/64 | HR 60

## 2011-03-03 DIAGNOSIS — R197 Diarrhea, unspecified: Secondary | ICD-10-CM

## 2011-03-03 DIAGNOSIS — Z86010 Personal history of colonic polyps: Secondary | ICD-10-CM

## 2011-03-03 MED ORDER — LOPERAMIDE HCL 2 MG PO TABS: ORAL_TABLET | ORAL | Status: DC

## 2011-03-03 NOTE — Progress Notes (Signed)
History of Present Illness: This is an 77 year old female with chronic diarrhea. She has had these symptoms for many years. She generally has 2-4 loose urgent bowel movements each day. Over the past few years she has had occasional episodes of fecal incontinence. She has had a full evaluation in the past and I have reviewed her extensive past records. The evaluation included negative celiac antibody titers in 2009 and colonoscopy in 12/2007 with negative random biopsies. She likely has diabetic diarrhea and/or IBS-D. Her symptoms are fairly well-controlled with Imodium however she generally takes Imodium following an episode of diarrhea as opposed to on a regularly scheduled basis. She has multiple comorbidities and is maintained on Coumadin anticoagulation. Her reflux symptoms are well controlled on omeprazole She denies weight loss, change in bowel habits, melena, hematochezia, dysphagia, odynophagia.   Past Medical History  Diagnosis Date  . Atrial fibrillation   . Diabetes mellitus     type II  . Anemia     Fe deficiency  . GERD (gastroesophageal reflux disease)   . Hyperlipidemia   . Hypertension   . Hypothyroid   . Obesity   . Vaginal dysplasia 1980  . Rosacea   . Chest pain 11/2002    cardiac cath neg   . Back pain   . IBS (irritable bowel syndrome)     with diarrhea  . Asthma     as a child  . Adenomatous colon polyp 12/2007  . Hemorrhoids   . Vertigo   . Hiatal hernia 12/2007    EGD   Past Surgical History  Procedure Date  . Abdominal hysterectomy 1963  . Cholecystectomy 2006  . Knee surgery     Rt TKR 1993 & Lt TKR 2001  . Cervical cone biopsy 1963    D&C  . Appendectomy 1941  . Cystocele repair 1978    /rectocele  . Orif ankle fracture 01/2010    fall/followed by rehab stay  . Cataract extraction 2006    bilateral    reports that she has quit smoking. She has never used smokeless tobacco. She reports that she does not drink alcohol or use illicit drugs. family  history includes COPD in her sister; Cancer in her mother; Colon cancer in her brother; Coronary artery disease in her mother; Dementia in her mother; Diabetes in her brother; Heart disease in her brother and father; and Kidney disease in her son. Allergies  Allergen Reactions  . Metoclopramide Hcl     REACTION: unspecified  . Nsaids     REACTION: unspecified  . Promethazine Hcl     REACTION: unspecified   Outpatient Encounter Prescriptions as of 03/03/2011  Medication Sig Dispense Refill  . acetaminophen (TYLENOL) 500 MG tablet OTC as directed.       . cyanocobalamin (,VITAMIN B-12,) 1000 MCG/ML injection Inject 1,000 mcg into the muscle every 7 (seven) days.        . ergocalciferol (VITAMIN D2) 50000 UNITS capsule Take 1 capsule (50,000 Units total) by mouth once a week. for 12 weeks  4 capsule  3  . furosemide (LASIX) 20 MG tablet Take 1 tablet (20 mg total) by mouth daily.  90 tablet  3  . glucose blood test strip Check blood sugar two times a day for DM 250.0 and as needed.       . indapamide (LOZOL) 2.5 MG tablet Take 2.5 mg by mouth daily.        . insulin glargine (LANTUS SOLOSTAR) 100 UNIT/ML injection Inject 60  Units into the skin every evening.        . Insulin Pen Needle 29G X 12.7MM MISC Use as directed with insulin injections.       Marland Kitchen levothyroxine (SYNTHROID, LEVOTHROID) 150 MCG tablet Take 150 mcg by mouth daily.        Marland Kitchen loperamide (IMODIUM A-D) 2 MG tablet Take 30-60 min before meals two to three times daily.  30 tablet    . methocarbamol (ROBAXIN) 500 MG tablet 1 tablet in AM and 1 tablet in PM as needed.       . metoprolol (LOPRESSOR) 50 MG tablet Take 25 mg by mouth 2 (two) times daily.        Marland Kitchen omeprazole (PRILOSEC) 20 MG capsule Take 20 mg by mouth daily.        . potassium chloride SA (K-DUR,KLOR-CON) 20 MEQ tablet Take 1 tablet (20 mEq total) by mouth 2 (two) times daily.  180 tablet  3  . simvastatin (ZOCOR) 40 MG tablet Take 1 tablet (40 mg total) by mouth at  bedtime.  90 tablet  3  . traMADol (ULTRAM) 50 MG tablet Take 50 mg by mouth every 6 (six) hours as needed.       . warfarin (COUMADIN) 5 MG tablet As directed.  90 tablet  3  . DISCONTD: loperamide (IMODIUM A-D) 2 MG tablet OTC as directed.       . ferrous sulfate 325 (65 FE) MG tablet Take 325 mg by mouth daily with breakfast.        . HYDROcodone-acetaminophen (VICODIN ES) 7.5-750 MG per tablet Take 1 tablet by mouth every 6 (six) hours as needed.         Facility-Administered Encounter Medications as of 03/03/2011  Medication Dose Route Frequency Provider Last Rate Last Dose  . cyanocobalamin ((VITAMIN B-12)) injection 1,000 mcg  1,000 mcg Intramuscular Once [name], MD   1,000 mcg at 03/02/11 1443     Physical Exam: General: Well developed , well nourished, no acute distress. Obese and chronically ill appearing in a wheelchair Head: Normocephalic and atraumatic Eyes:  sclerae anicteric, EOMI Ears: Normal auditory acuity Mouth: No deformity or lesions Lungs: Clear throughout to auscultation Heart: Regular rate and rhythm; no murmurs, rubs or bruits Abdomen: Soft, non tender and non distended. No masses, hepatosplenomegaly or hernias noted. Normal Bowel sounds Musculoskeletal: Symmetrical with no gross deformities  Pulses:  Normal pulses noted Extremities: No clubbing, cyanosis, edema or deformities noted Neurological: Alert oriented x 4, grossly nonfocal Psychological:  Alert and cooperative. Normal mood and affect  Assessment and Recommendations:  1. Chronic diarrhea, occasional incontinence. Most likely diabetic diarrhea and/or IBS-D. Repeat her celiac antibody profile however, since her symptoms are not changed over the years and she has multiple comorbidities, I would not proceed with further evaluation. I have asked her to change her scheduled dosing of Imodium to 30-60 minutes before meals 2 or 3 times daily on a regular basis. She can hold a doses for constipation.  2.  Personal history of adenomatous colon polyps. Given her age and significant comorbidities she is not a good candidate for surveillance procedures. No plans for future screening or surveillance colonoscopies.  3. GERD. Continue omeprazole 20 mg daily and standard antireflux measures.

## 2011-03-03 NOTE — Patient Instructions (Signed)
Go directly to the basement to have your labs drawn today. Take your Imodium 30 to 60 min before meals two to three times a day for long term use.  Increase as needed for diarrhea. cc: [name], MD

## 2011-03-04 ENCOUNTER — Ambulatory Visit: Payer: Medicare Other

## 2011-03-04 LAB — CELIAC PANEL 10
Tissue Transglut Ab: 80.9 U/mL — ABNORMAL HIGH (ref <20)
Tissue Transglutaminase Ab, IgA: 9.7 U/mL (ref <20)

## 2011-03-05 NOTE — Progress Notes (Signed)
  Subjective:    Patient ID: [name], female    DOB: 26-Aug-1922, 77 y.o.   MRN: [id number]  HPI Here for inj only   Review of Systems     Objective:   Physical Exam        Assessment & Plan:

## 2011-03-07 ENCOUNTER — Telehealth: Payer: Self-pay | Admitting: *Deleted

## 2011-03-07 ENCOUNTER — Encounter: Payer: Self-pay | Admitting: *Deleted

## 2011-03-07 NOTE — Telephone Encounter (Signed)
Message copied by Florene Glen on Mon Mar 07, 2011 11:03 AM ------      Message from: [name] A      Created: Mon Mar 07, 2011 10:22 AM      Regarding: RE: anticoagulation       Thanks - I think the question was how long she could be off coumadin...      She has afib so I want her off as shortly as possible      If you tell me how long minimum she has to be off I will decide if I need to bridge with lovenox  (also how long she has to be anticoagulated after the procedure)      Please remind me again - date of procedure      Thanks!      --Idamae Schuller T      ----- Message -----         From: Linna Hoff, RN         Sent: 03/07/2011   9:43 AM           To: [name], MD      Subject: anticoagulation                                          Dr Milinda Antis, I did not convert your Staff Message and lost the reply for pt's anticoagulation for biopsy by Dr Russella Dar. Can you please resend the reply? Thanks, Graciella Freer, RN

## 2011-03-07 NOTE — Telephone Encounter (Addendum)
Dr Russella Dar, please see Dr Royden Purl note and advise; the procedure has not been scheduled yet. Pt needs EGD with duodenal bx to r/o Celiac;  tTG Ab was positive

## 2011-03-09 ENCOUNTER — Ambulatory Visit (INDEPENDENT_AMBULATORY_CARE_PROVIDER_SITE_OTHER): Payer: Medicare Other | Admitting: Family Medicine

## 2011-03-09 DIAGNOSIS — E538 Deficiency of other specified B group vitamins: Secondary | ICD-10-CM

## 2011-03-09 MED ORDER — CYANOCOBALAMIN 1000 MCG/ML IJ SOLN
1000.0000 ug | Freq: Once | INTRAMUSCULAR | Status: AC
  Administered 2011-03-09: 1000 ug via INTRAMUSCULAR

## 2011-03-09 NOTE — Progress Notes (Signed)
B-12 given IM right deltoid.

## 2011-03-11 ENCOUNTER — Ambulatory Visit: Payer: Medicare Other

## 2011-03-12 NOTE — Telephone Encounter (Signed)
We have standard protocols for holding Coumadin for procedures which would be 3 days and then resuming Coumadin later in the day of the procedure.

## 2011-03-15 NOTE — Telephone Encounter (Signed)
Note has been sent to Dr Milinda Antis for her advise

## 2011-03-16 ENCOUNTER — Other Ambulatory Visit: Payer: Self-pay | Admitting: Neurology

## 2011-03-16 ENCOUNTER — Ambulatory Visit: Payer: Medicare Other | Admitting: Family Medicine

## 2011-03-16 DIAGNOSIS — R51 Headache: Secondary | ICD-10-CM

## 2011-03-16 DIAGNOSIS — R42 Dizziness and giddiness: Secondary | ICD-10-CM

## 2011-03-18 ENCOUNTER — Ambulatory Visit: Payer: Medicare Other | Admitting: Family Medicine

## 2011-03-18 ENCOUNTER — Ambulatory Visit: Payer: Medicare Other

## 2011-03-21 ENCOUNTER — Telehealth: Payer: Self-pay

## 2011-03-21 ENCOUNTER — Ambulatory Visit: Payer: Medicare Other | Admitting: Family Medicine

## 2011-03-21 ENCOUNTER — Ambulatory Visit (INDEPENDENT_AMBULATORY_CARE_PROVIDER_SITE_OTHER): Payer: Medicare Other | Admitting: Family Medicine

## 2011-03-21 ENCOUNTER — Encounter: Payer: Self-pay | Admitting: Family Medicine

## 2011-03-21 DIAGNOSIS — E538 Deficiency of other specified B group vitamins: Secondary | ICD-10-CM

## 2011-03-21 DIAGNOSIS — K589 Irritable bowel syndrome without diarrhea: Secondary | ICD-10-CM

## 2011-03-21 DIAGNOSIS — I4891 Unspecified atrial fibrillation: Secondary | ICD-10-CM

## 2011-03-21 DIAGNOSIS — Z5181 Encounter for therapeutic drug level monitoring: Secondary | ICD-10-CM

## 2011-03-21 DIAGNOSIS — E559 Vitamin D deficiency, unspecified: Secondary | ICD-10-CM

## 2011-03-21 DIAGNOSIS — I10 Essential (primary) hypertension: Secondary | ICD-10-CM

## 2011-03-21 DIAGNOSIS — Z7901 Long term (current) use of anticoagulants: Secondary | ICD-10-CM

## 2011-03-21 DIAGNOSIS — E119 Type 2 diabetes mellitus without complications: Secondary | ICD-10-CM

## 2011-03-21 LAB — VITAMIN B12: Vitamin B-12: 608 pg/mL (ref 211–911)

## 2011-03-21 NOTE — Patient Instructions (Signed)
Continue current dose, check in 4 weeks  

## 2011-03-21 NOTE — Telephone Encounter (Signed)
I have contacted the patient again about scheduling EGD with small bowel biopsy to rule out celiac disease.  She has decided she is doing much better with the diarrhea and doesn't want to proceed with the procedure at this time.  She is asked to call back if anything changes.

## 2011-03-21 NOTE — Assessment & Plan Note (Signed)
Currently on 2nd 12 week course of high dose vit D Check level in 2 mo and f/u Stressed imp of this for bone and general health

## 2011-03-21 NOTE — Assessment & Plan Note (Signed)
bp is on the low side today and pt c/o feeling dehydrated Will hold lasix and see how she does (also on lozol) Renal panel 2 mo and f/u  She will update if much edema

## 2011-03-21 NOTE — Patient Instructions (Signed)
Stop your furosemide - your blood pressure is low enough  Let me know if you have any problems  Labs for B12 today Schedule next labs for 2 months - for vitamin D and potassium and diabetes  Then follow up

## 2011-03-21 NOTE — Assessment & Plan Note (Signed)
Check today after 5 B12 shots in a month Then decide schedule  Pt does have significant GI troubles and is on PPI that can prev absorption orally

## 2011-03-21 NOTE — Progress Notes (Signed)
Subjective:    Patient ID: [name], female    DOB: January 04, 1923, 77 y.o.   MRN: [id number]  HPI Here for f/u of B12 def and DM and HTN  She is feeling fair - her usual  Still lots of chronic pain - that is her biggest problem  She saw Dr Russella Dar about her diarrhea - and was told she can take more immodium than she was and it has helped a lot  Is doing better so they are putting off her small bowel bx for poss celiac   B12 was 123 in July Has had 5 b12 shots  Needs lab today Cannot tell a difference clinically   Also on weekly high dose vit D and that will be re checked in the fall She is taking it weekly compliantly Does not feel any different   Last a1c was 7.7 down from 8.2 Diet admittedly last time was bad  lantus currently is 60 units daily Is watching her sugars - runs 100 to 130 on average (this is at different times of day) Of note no wt loss  No appetite- does not want much  Craves chocolate candy and peanut butter   HTN - bp low today at 87/57 - at home was fine this am - this low pressure is unusual for her  She got choked brushing her teeth today - ? If that made her bp low  On lasix, lopressor and lozol (thiazide diuretic)  Saw Dr Anne Hahn for her chronic dizziness- will get MRI on Friday  Is taking meclizine in the ams   bp 90/60 on my check today- is feeling ok when she changes position   Patient Active Problem List  Diagnoses  . ADENOMATOUS COLONIC POLYP  . HYPOTHYROIDISM  . DIABETES MELLITUS, TYPE II  . HYPERLIPIDEMIA  . HYPERTENSION  . ATRIAL FIBRILLATION  . CHRONIC RHINITIS  . GERD  . HIATAL HERNIA  . IRRITABLE BOWEL SYNDROME  . ROSACEA  . INTERTRIGO  . OSTEOPOROSIS  . UNSPECIFIED VITAMIN D DEFICIENCY  . Lump in the abdomen  . Fatigue  . B12 deficiency   Past Medical History  Diagnosis Date  . Atrial fibrillation   . Diabetes mellitus     type II  . Anemia     Fe deficiency  . GERD (gastroesophageal reflux disease)   .  Hyperlipidemia   . Hypertension   . Hypothyroid   . Obesity   . Vaginal dysplasia 1980  . Rosacea   . Chest pain 11/2002    cardiac cath neg   . Back pain   . IBS (irritable bowel syndrome)     with diarrhea  . Asthma     as a child  . Adenomatous colon polyp 12/2007  . Hemorrhoids   . Vertigo   . Hiatal hernia 12/2007    EGD   Past Surgical History  Procedure Date  . Abdominal hysterectomy 1963  . Cholecystectomy 2006  . Knee surgery     Rt TKR 1993 & Lt TKR 2001  . Cervical cone biopsy 1963    D&C  . Appendectomy 1941  . Cystocele repair 1978    /rectocele  . Orif ankle fracture 01/2010    fall/followed by rehab stay  . Cataract extraction 2006    bilateral   History  Substance Use Topics  . Smoking status: Former Games developer  . Smokeless tobacco: Never Used  . Alcohol Use: No   Family History  Problem Relation Age of  Onset  . Cancer Mother     leukemia  . Coronary artery disease Mother   . Dementia Mother   . Heart disease Father     CAD  . COPD Sister   . Heart disease Brother   . Diabetes Brother   . Kidney disease Son   . Colon cancer Brother    Allergies  Allergen Reactions  . Metoclopramide Hcl     REACTION: unspecified  . Nsaids     REACTION: unspecified  . Promethazine Hcl     REACTION: unspecified   Current Outpatient Prescriptions on File Prior to Visit  Medication Sig Dispense Refill  . acetaminophen (TYLENOL) 500 MG tablet OTC as directed.       . cyanocobalamin (,VITAMIN B-12,) 1000 MCG/ML injection Inject 1,000 mcg into the muscle every 7 (seven) days.        . ergocalciferol (VITAMIN D2) 50000 UNITS capsule Take 1 capsule (50,000 Units total) by mouth once a week. for 12 weeks  4 capsule  3  . glucose blood test strip Check blood sugar two times a day for DM 250.0 and as needed.       . indapamide (LOZOL) 2.5 MG tablet Take 2.5 mg by mouth daily.        . insulin glargine (LANTUS SOLOSTAR) 100 UNIT/ML injection Inject 60 Units into  the skin every evening.        . Insulin Pen Needle 29G X 12.7MM MISC Use as directed with insulin injections.       Marland Kitchen levothyroxine (SYNTHROID, LEVOTHROID) 150 MCG tablet Take 150 mcg by mouth daily.        Marland Kitchen loperamide (IMODIUM A-D) 2 MG tablet Take 30-60 min before meals two to three times daily.  30 tablet    . methocarbamol (ROBAXIN) 500 MG tablet 1 tablet in AM and 1 tablet in PM as needed.       . metoprolol (LOPRESSOR) 50 MG tablet Take 25 mg by mouth 2 (two) times daily.        Marland Kitchen omeprazole (PRILOSEC) 20 MG capsule Take 20 mg by mouth daily.        . potassium chloride SA (K-DUR,KLOR-CON) 20 MEQ tablet Take 1 tablet (20 mEq total) by mouth 2 (two) times daily.  180 tablet  3  . simvastatin (ZOCOR) 40 MG tablet Take 1 tablet (40 mg total) by mouth at bedtime.  90 tablet  3  . traMADol (ULTRAM) 50 MG tablet Take 50 mg by mouth every 6 (six) hours as needed.       . warfarin (COUMADIN) 5 MG tablet As directed.  90 tablet  3  . ferrous sulfate 325 (65 FE) MG tablet Take 325 mg by mouth daily with breakfast.        . HYDROcodone-acetaminophen (VICODIN ES) 7.5-750 MG per tablet Take 1 tablet by mouth every 6 (six) hours as needed.             Review of Systems Review of Systems  Constitutional: Negative for fever, appetite change, and unexpected weight change. pos for chronic fatigue  Eyes: Negative for pain and visual disturbance.  Respiratory: Negative for cough and shortness of breath.   Cardiovascular: Negative.  for cp or palpitations  Gastrointestinal: Negative for nausea, diarrhea and constipation.  Genitourinary: Negative for urgency and frequency.  Skin: Negative for pallor. or rash MSK pos for chronic pain in back and legs  Neurological: Negative for weakness, , numbness and headaches. has chronic dizziness  Hematological: Negative for adenopathy. Does not bruise/bleed easily on coumadin Psychiatric/Behavioral: Negative for dysphoric mood. Does tend to be anxious in general.           Objective:   Physical Exam  Constitutional: She appears well-developed and well-nourished. No distress.       overwt and fatigued appearing   HENT:  Head: Normocephalic and atraumatic.  Mouth/Throat: Oropharynx is clear and moist.  Eyes: Conjunctivae and EOM are normal. Pupils are equal, round, and reactive to light.  Neck: Normal range of motion. Neck supple. No JVD present. Carotid bruit is not present. No thyromegaly present.  Cardiovascular: Normal rate, regular rhythm, normal heart sounds and intact distal pulses.   Pulmonary/Chest: Effort normal and breath sounds normal. No respiratory distress. She has no wheezes.  Abdominal: Soft. Bowel sounds are normal. She exhibits no distension and no mass. There is no tenderness.  Musculoskeletal: She exhibits tenderness. She exhibits no edema.       Poor rom LS with tenderness Also myofascial tender points  Lymphadenopathy:    She has no cervical adenopathy.  Neurological: She is alert. She has normal reflexes. No cranial nerve deficit. Coordination normal.  Skin: Skin is warm and dry. No rash noted. No erythema. No pallor.       Fair complexion  Psychiatric: She has a normal mood and affect.       Very talkative           Assessment & Plan:

## 2011-03-21 NOTE — Assessment & Plan Note (Addendum)
Per pt sugar control is improved with lantus  Continue this Disc imp of regular meals even without much appetite Due to chronic pain pt gets little to no exercise  Skipped foot exam today as getting shoes off was too hard for pt - will do next time  a1c in 2 mo and f/u

## 2011-03-21 NOTE — Telephone Encounter (Signed)
Message copied by Annett Fabian on Mon Mar 21, 2011 10:24 AM ------      Message from: [name] A      Created: Thu Mar 17, 2011  9:56 PM       Yes- that is ok - thanks       ----- Message -----         From: Rossie Muskrat, RN,CGRN         Sent: 03/17/2011   1:44 PM           To: [name], MD            Dr Milinda Antis,             Dr Russella Dar has indicated that he could safely do the small bowel biopsy holding the patient's coumadin for 3 days.  Is it ok to hold coumadin for the 3 days?

## 2011-03-21 NOTE — Assessment & Plan Note (Signed)
Per pt much imp after inc her immodium  Has put off the small bowel bx - so happy with that

## 2011-03-22 NOTE — Telephone Encounter (Signed)
OK. Her decision is noted.

## 2011-03-25 ENCOUNTER — Ambulatory Visit
Admission: RE | Admit: 2011-03-25 | Discharge: 2011-03-25 | Disposition: A | Discharge status: Home / Self Care | Payer: Medicare Other | Source: Ambulatory Visit | Attending: Neurology | Admitting: Neurology

## 2011-03-25 DIAGNOSIS — R51 Headache: Secondary | ICD-10-CM

## 2011-03-25 DIAGNOSIS — R42 Dizziness and giddiness: Secondary | ICD-10-CM

## 2011-04-01 ENCOUNTER — Telehealth: Payer: Self-pay

## 2011-04-01 NOTE — Telephone Encounter (Signed)
She has so many stomach issues that I suspect her stomach does not absorb it properly -that is why I would stay with shots for her

## 2011-04-01 NOTE — Telephone Encounter (Signed)
Patient notified. She said her insurance isn't covering the shots for some reason. They said if something is available OTC then they won't cover it. I told her that if she medically required the injection, then they should cover it. She says they haven't been, so she is going to try and find out what the problem is. I advised her to make sure they are aware she can't absorb the oral med and therefore requires the injection. She will continue getting them though.

## 2011-04-01 NOTE — Telephone Encounter (Signed)
Thanks - she can let us know when she has discussed this with her insurance co , thanks

## 2011-04-01 NOTE — Telephone Encounter (Signed)
Pt wonders if she can take OTC Vit B12 tablets instead of having to get the Vit B12 shots.Please advise.

## 2011-04-05 NOTE — Telephone Encounter (Signed)
Patient advised as instructed via telephone. 

## 2011-04-18 ENCOUNTER — Ambulatory Visit: Payer: Medicare Other

## 2011-04-19 ENCOUNTER — Ambulatory Visit (INDEPENDENT_AMBULATORY_CARE_PROVIDER_SITE_OTHER): Payer: Medicare Other | Admitting: Family Medicine

## 2011-04-19 DIAGNOSIS — I4891 Unspecified atrial fibrillation: Secondary | ICD-10-CM

## 2011-04-19 DIAGNOSIS — Z5181 Encounter for therapeutic drug level monitoring: Secondary | ICD-10-CM

## 2011-04-19 DIAGNOSIS — Z7901 Long term (current) use of anticoagulants: Secondary | ICD-10-CM

## 2011-04-19 NOTE — Patient Instructions (Signed)
Continue 2.5 mg daily, recheck 4 weeks 

## 2011-04-20 ENCOUNTER — Other Ambulatory Visit: Payer: MEDICARE

## 2011-04-25 LAB — PROTIME-INR
INR: 3.3 — ABNORMAL HIGH
Prothrombin Time: 34.9 — ABNORMAL HIGH

## 2011-04-29 LAB — POCT RAPID STREP A: Streptococcus, Group A Screen (Direct): NEGATIVE

## 2011-05-16 ENCOUNTER — Other Ambulatory Visit: Payer: Medicare Other

## 2011-05-16 ENCOUNTER — Ambulatory Visit: Payer: Medicare Other

## 2011-05-16 ENCOUNTER — Other Ambulatory Visit (INDEPENDENT_AMBULATORY_CARE_PROVIDER_SITE_OTHER): Payer: Medicare Other

## 2011-05-16 ENCOUNTER — Ambulatory Visit (INDEPENDENT_AMBULATORY_CARE_PROVIDER_SITE_OTHER): Payer: Medicare Other | Admitting: Family Medicine

## 2011-05-16 DIAGNOSIS — I10 Essential (primary) hypertension: Secondary | ICD-10-CM

## 2011-05-16 DIAGNOSIS — Z7901 Long term (current) use of anticoagulants: Secondary | ICD-10-CM

## 2011-05-16 DIAGNOSIS — Z5181 Encounter for therapeutic drug level monitoring: Secondary | ICD-10-CM

## 2011-05-16 DIAGNOSIS — I4891 Unspecified atrial fibrillation: Secondary | ICD-10-CM

## 2011-05-16 DIAGNOSIS — E559 Vitamin D deficiency, unspecified: Secondary | ICD-10-CM

## 2011-05-16 DIAGNOSIS — E119 Type 2 diabetes mellitus without complications: Secondary | ICD-10-CM

## 2011-05-16 LAB — HEPATIC FUNCTION PANEL
ALT: 16 U/L (ref 0–35)
AST: 21 U/L (ref 0–37)
Albumin: 3.9 g/dL (ref 3.5–5.2)
Alkaline Phosphatase: 44 U/L (ref 39–117)
Bilirubin, Direct: 0 mg/dL (ref 0.0–0.3)
Total Protein: 7.7 g/dL (ref 6.0–8.3)

## 2011-05-16 LAB — RENAL FUNCTION PANEL
Albumin: 3.9 g/dL (ref 3.5–5.2)
BUN: 22 mg/dL (ref 6–23)
CO2: 27 mEq/L (ref 19–32)
Calcium: 9.1 mg/dL (ref 8.4–10.5)
Creatinine, Ser: 1.2 mg/dL (ref 0.4–1.2)
Glucose, Bld: 106 mg/dL — ABNORMAL HIGH (ref 70–99)
Sodium: 142 mEq/L (ref 135–145)

## 2011-05-16 LAB — HEMOGLOBIN A1C: Hgb A1c MFr Bld: 7.5 % — ABNORMAL HIGH (ref 4.6–6.5)

## 2011-05-16 NOTE — Patient Instructions (Signed)
Continue current dose, check in 4 weeks  

## 2011-05-16 NOTE — Progress Notes (Signed)
Addended by: Alvina Chou on: 05/16/2011 11:49 AM   Modules accepted: Orders

## 2011-05-25 ENCOUNTER — Ambulatory Visit: Payer: Medicare Other | Admitting: Family Medicine

## 2011-06-06 ENCOUNTER — Ambulatory Visit (INDEPENDENT_AMBULATORY_CARE_PROVIDER_SITE_OTHER): Payer: Medicare Other | Admitting: Family Medicine

## 2011-06-06 ENCOUNTER — Encounter: Payer: Self-pay | Admitting: Family Medicine

## 2011-06-06 VITALS — BP 140/82 | HR 88 | Temp 97.5°F

## 2011-06-06 DIAGNOSIS — I10 Essential (primary) hypertension: Secondary | ICD-10-CM

## 2011-06-06 DIAGNOSIS — E559 Vitamin D deficiency, unspecified: Secondary | ICD-10-CM

## 2011-06-06 DIAGNOSIS — E119 Type 2 diabetes mellitus without complications: Secondary | ICD-10-CM

## 2011-06-06 MED ORDER — ERGOCALCIFEROL 1.25 MG (50000 UT) PO CAPS: 50000.0000 [IU] | ORAL_CAPSULE | ORAL | Status: DC

## 2011-06-06 NOTE — Progress Notes (Signed)
Subjective:    Patient ID: [name], female    DOB: Oct 15, 1922, 77 y.o.   MRN: [id number]  HPI Here for f/u of DM and HTN and vit D def  bp is 140/82 Last visit due to some feelings of dehydration decided to hold lasix Is swollen all the time -- is almost intolerable in hands and feet  No cp or sob    Chemistry      Component Value Date/Time   NA 142 05/16/2011 1144   K 4.2 05/16/2011 1144   CL 106 05/16/2011 1144   CO2 27 05/16/2011 1144   BUN 22 05/16/2011 1144   CREATININE 1.2 05/16/2011 1144      Component Value Date/Time   CALCIUM 9.1 05/16/2011 1144   ALKPHOS 44 05/16/2011 1144   AST 21 05/16/2011 1144   ALT 16 05/16/2011 1144   BILITOT 0.7 05/16/2011 1144     water intake- is fair  Wants to go back on lasix  No longer having as much diarrhea    DM- slow improvement - a1c is down to 7.5 from 7.7 Using lantus 60 u daily - has issue of that  Is having trouble affording it --will be in donut hole until end of the year  She inj in her abdomen-- and then she itches all over -- but no rash  Just had her eye exam -- (R eye cloud due to her cataract removal)   No DM damage in eyes  Lipids were well controlled with statin in June Diet- is not following a DM diet - her husband does the cooking and cannot have greens due to warfarin  Lots of excuses  Not much appetite   Unable to  exercise due to chronic pain   Vit D is still low - at 28- after 2 hi dose weekly courses of vit D Not taking any otc    Lab Results  Component Value Date   TSH 0.90 01/21/2011   no clinical changes for thyroid  Patient Active Problem List  Diagnoses  . ADENOMATOUS COLONIC POLYP  . HYPOTHYROIDISM  . DIABETES MELLITUS, TYPE II  . HYPERLIPIDEMIA  . HYPERTENSION  . ATRIAL FIBRILLATION  . CHRONIC RHINITIS  . GERD  . HIATAL HERNIA  . IRRITABLE BOWEL SYNDROME  . ROSACEA  . INTERTRIGO  . OSTEOPOROSIS  . UNSPECIFIED VITAMIN D DEFICIENCY  . Lump in the abdomen  . Fatigue  .  B12 deficiency   Past Medical History  Diagnosis Date  . Atrial fibrillation   . Diabetes mellitus     type II  . Anemia     Fe deficiency  . GERD (gastroesophageal reflux disease)   . Hyperlipidemia   . Hypertension   . Hypothyroid   . Obesity   . Vaginal dysplasia 1980  . Rosacea   . Chest pain 11/2002    cardiac cath neg   . Back pain   . IBS (irritable bowel syndrome)     with diarrhea  . Asthma     as a child  . Adenomatous colon polyp 12/2007  . Hemorrhoids   . Vertigo   . Hiatal hernia 12/2007    EGD   Past Surgical History  Procedure Date  . Abdominal hysterectomy 1963  . Cholecystectomy 2006  . Knee surgery     Rt TKR 1993 & Lt TKR 2001  . Cervical cone biopsy 1963    D&C  . Appendectomy 1941  . Cystocele repair [id number]    /  rectocele  . Orif ankle fracture 01/2010    fall/followed by rehab stay  . Cataract extraction 2006    bilateral   History  Substance Use Topics  . Smoking status: Former Games developer  . Smokeless tobacco: Never Used  . Alcohol Use: No   Family History  Problem Relation Age of Onset  . Cancer Mother     leukemia  . Coronary artery disease Mother   . Dementia Mother   . Heart disease Father     CAD  . COPD Sister   . Heart disease Brother   . Diabetes Brother   . Kidney disease Son   . Colon cancer Brother    Allergies  Allergen Reactions  . Lantus     Itching   . Latex     Rash   . Metformin And Related     Severe diarrhea   . Metoclopramide Hcl     REACTION: unspecified  . Nsaids     REACTION: unspecified  . Promethazine Hcl     REACTION: unspecified   Current Outpatient Prescriptions on File Prior to Visit  Medication Sig Dispense Refill  . acetaminophen (TYLENOL) 500 MG tablet OTC as directed.       Marland Kitchen glucose blood test strip Check blood sugar two times a day for DM 250.0 and as needed.       . indapamide (LOZOL) 2.5 MG tablet Take 2.5 mg by mouth daily.        . Insulin Pen Needle 29G X 12.7MM MISC Use as  directed with insulin injections.       Marland Kitchen levothyroxine (SYNTHROID, LEVOTHROID) 150 MCG tablet Take 150 mcg by mouth daily.        Marland Kitchen loperamide (IMODIUM A-D) 2 MG tablet Take 30-60 min before meals two to three times daily.  30 tablet    . methocarbamol (ROBAXIN) 500 MG tablet 1 tablet in AM and 1 tablet in PM as needed.       . metoprolol (LOPRESSOR) 50 MG tablet Take 25 mg by mouth 2 (two) times daily.        Marland Kitchen omeprazole (PRILOSEC) 20 MG capsule Take 20 mg by mouth daily.        . potassium chloride SA (K-DUR,KLOR-CON) 20 MEQ tablet Take 1 tablet (20 mEq total) by mouth 2 (two) times daily.  180 tablet  3  . simvastatin (ZOCOR) 40 MG tablet Take 1 tablet (40 mg total) by mouth at bedtime.  90 tablet  3  . traMADol (ULTRAM) 50 MG tablet Take 50 mg by mouth every 6 (six) hours as needed.       . warfarin (COUMADIN) 5 MG tablet As directed.  90 tablet  3  . HYDROcodone-acetaminophen (VICODIN ES) 7.5-750 MG per tablet Take 1 tablet by mouth every 6 (six) hours as needed.            Review of Systems Review of Systems  Constitutional: Negative for fever, appetite change,  and unexpected weight change. fatigue is chronic  Eyes: Negative for pain and visual disturbance.  Respiratory: Negative for cough and shortness of breath.   Cardiovascular: Negative for cp or palpitations    Gastrointestinal: Negative for nausea, diarrhea and constipation.  Genitourinary: Negative for urgency and frequency.  Skin: Negative for pallor or rash  pos for itching when she takes her lantus  MSK pos for severe full body pain from spinal stenosis and OA all the time , aff her mobility Neurological: Negative for  weakness, light-headedness, numbness and headaches.  Hematological: Negative for adenopathy. Does not bruise/bleed easily.  Psychiatric/Behavioral: Negative for dysphoric mood. The patient is not nervous/anxious.          Objective:   Physical Exam  Constitutional: She appears well-developed and  well-nourished. No distress.       Obese and well appearing in wheelchair  HENT:  Head: Normocephalic and atraumatic.  Mouth/Throat: Oropharynx is clear and moist.  Eyes: Conjunctivae and EOM are normal. Pupils are equal, round, and reactive to light. No scleral icterus.  Neck: Normal range of motion. Neck supple. No JVD present. Carotid bruit is not present. No thyromegaly present.  Cardiovascular: Normal rate, regular rhythm, normal heart sounds and intact distal pulses.  Exam reveals no gallop.   Pulmonary/Chest: Effort normal and breath sounds normal. No respiratory distress. She has no wheezes.  Abdominal: Soft. Bowel sounds are normal. She exhibits no distension and no mass. There is no tenderness.  Musculoskeletal: She exhibits edema and tenderness.       bilat pedal edema 1 plus with no erythema  Joints in hands/ feet/knees tender diffusely  LS tender - baseline   Lymphadenopathy:    She has no cervical adenopathy.  Neurological: She is alert. No cranial nerve deficit. She exhibits normal muscle tone. Coordination normal.  Skin: Skin is warm and dry. No rash noted. No erythema. No pallor.  Psychiatric:       Very tangential and pt interrupts frequently  Unsure she listens to much we talk about at all           Assessment & Plan:

## 2011-06-06 NOTE — Assessment & Plan Note (Signed)
Pt now appears to be allergic to lantus- with whole body itching after each dose  Will stop this  Has been on several meds with inadequate response or intolerance Refuses to eat DM diet opthy utd Ref to endo when out of donut hole

## 2011-06-06 NOTE — Assessment & Plan Note (Signed)
This is still low Will repeat another high dose weekly course with another 2000 iu daily otc  r echeck at f/u in 3 mo  Also disc imp of ca for bones

## 2011-06-06 NOTE — Patient Instructions (Addendum)
Stop lantus since you may be allergic to it - (itching)  We will refer you to an endocrinologist for your diabetes at check out  You need to try to eat a diabetic diet  Start back on lasix 20 mg daily for your swelling  Follow up in 3 months  Take another course of vitamin D px and also take additional 2000 units over the counter of vitamin D  Make sure you are also getting 1200-1500 mg of calcium per day

## 2011-06-06 NOTE — Assessment & Plan Note (Signed)
bp is up off lasix and pt cannot tolerate the edema Will go back on 20 daily  Watch bp closely With diarrhea under control -- should not risk dehydration again Will keep me updated  F/u 3 mo

## 2011-06-09 ENCOUNTER — Telehealth: Payer: Self-pay

## 2011-06-09 NOTE — Telephone Encounter (Signed)
Received fax from [place]. Pt stated to pharmacy that she was to take and additional 4,000 iu of Vitamin D (otc) every week in addition to her 50,000 iu rx. [facility] wanted verification from Dr Milinda Antis if that was correct.  I looked at 06/06/11 visit and it looks like pt was to get 50,000 iu weekly course of Vitamin D with another 2000 iu daily OTC.Please advise.

## 2011-06-09 NOTE — Telephone Encounter (Signed)
[place] [facility] [place] notified as instructed by telephone.

## 2011-06-09 NOTE — Telephone Encounter (Signed)
I want her to take another 2000 iu ,thanks

## 2011-06-13 ENCOUNTER — Ambulatory Visit: Payer: Medicare Other

## 2011-06-14 ENCOUNTER — Encounter: Payer: Self-pay | Admitting: Family Medicine

## 2011-06-15 ENCOUNTER — Ambulatory Visit: Payer: Medicare Other

## 2011-06-16 ENCOUNTER — Ambulatory Visit (INDEPENDENT_AMBULATORY_CARE_PROVIDER_SITE_OTHER): Payer: Medicare Other | Admitting: Internal Medicine

## 2011-06-16 DIAGNOSIS — Z5181 Encounter for therapeutic drug level monitoring: Secondary | ICD-10-CM

## 2011-06-16 DIAGNOSIS — I4891 Unspecified atrial fibrillation: Secondary | ICD-10-CM

## 2011-06-16 DIAGNOSIS — Z7901 Long term (current) use of anticoagulants: Secondary | ICD-10-CM

## 2011-06-16 NOTE — Patient Instructions (Signed)
Continue current dose, check in 4 weeks  

## 2011-07-13 ENCOUNTER — Ambulatory Visit (INDEPENDENT_AMBULATORY_CARE_PROVIDER_SITE_OTHER): Payer: Medicare Other | Admitting: Family Medicine

## 2011-07-13 DIAGNOSIS — I4891 Unspecified atrial fibrillation: Secondary | ICD-10-CM

## 2011-07-13 DIAGNOSIS — Z7901 Long term (current) use of anticoagulants: Secondary | ICD-10-CM

## 2011-07-13 DIAGNOSIS — Z5181 Encounter for therapeutic drug level monitoring: Secondary | ICD-10-CM

## 2011-07-13 LAB — POCT INR: INR: 1.6

## 2011-07-13 NOTE — Patient Instructions (Signed)
Take a 5mg  x 2 days then Continue 2.5 mg daily, recheck 2 weeks

## 2011-07-14 ENCOUNTER — Ambulatory Visit: Payer: Medicare Other

## 2011-07-28 ENCOUNTER — Ambulatory Visit: Payer: Medicare Other

## 2011-07-28 ENCOUNTER — Ambulatory Visit (INDEPENDENT_AMBULATORY_CARE_PROVIDER_SITE_OTHER): Payer: Medicare Other | Admitting: Family Medicine

## 2011-07-28 DIAGNOSIS — I4891 Unspecified atrial fibrillation: Secondary | ICD-10-CM

## 2011-07-28 DIAGNOSIS — Z7901 Long term (current) use of anticoagulants: Secondary | ICD-10-CM

## 2011-07-28 DIAGNOSIS — Z5181 Encounter for therapeutic drug level monitoring: Secondary | ICD-10-CM

## 2011-07-28 NOTE — Patient Instructions (Signed)
Continue current dose, check in 4 weeks  

## 2011-08-03 ENCOUNTER — Other Ambulatory Visit: Payer: Self-pay

## 2011-08-03 MED ORDER — TRAMADOL HCL 50 MG PO TABS: ORAL_TABLET | ORAL | Status: DC

## 2011-08-03 NOTE — Telephone Encounter (Signed)
Pt has been seeing Dr Lupita Raider orthopedic dr since 1993 (has had 2 knee replacements in the past). Pt has arthritis pain in legs and knees. Dr Lupita Raider has retired and pt cannot get Tramadol refilled. Pt is out of med completely. Pt takes Tramadol 50 mg 2 tabs tid prn. Pt said she usually takes 2 tab in AM and 2 tabs at hs. Pt uses [facility] and can be reached at [phone]. Pt request call in today.Please advise.

## 2011-08-03 NOTE — Telephone Encounter (Signed)
Use with caution- is sedating and can cause falls Whenever possible - take 1 instead of 2  Will refill electronically

## 2011-08-03 NOTE — Telephone Encounter (Signed)
Patient notified as instructed by telephone. 

## 2011-08-11 ENCOUNTER — Ambulatory Visit (INDEPENDENT_AMBULATORY_CARE_PROVIDER_SITE_OTHER): Payer: Medicare Other | Admitting: Family Medicine

## 2011-08-11 DIAGNOSIS — Z7901 Long term (current) use of anticoagulants: Secondary | ICD-10-CM

## 2011-08-11 DIAGNOSIS — I4891 Unspecified atrial fibrillation: Secondary | ICD-10-CM

## 2011-08-11 DIAGNOSIS — Z5181 Encounter for therapeutic drug level monitoring: Secondary | ICD-10-CM

## 2011-08-11 NOTE — Patient Instructions (Signed)
Continue current dose, check in 4 weeks  

## 2011-08-15 ENCOUNTER — Ambulatory Visit: Payer: Medicare Other | Admitting: Endocrinology

## 2011-08-30 ENCOUNTER — Other Ambulatory Visit: Payer: Self-pay | Admitting: Family Medicine

## 2011-08-30 NOTE — Telephone Encounter (Signed)
Refills requested.  Left no details and asked for call back at [phone]

## 2011-08-31 ENCOUNTER — Other Ambulatory Visit: Payer: Self-pay

## 2011-08-31 MED ORDER — OMEPRAZOLE 20 MG PO CPDR: 20.0000 mg | DELAYED_RELEASE_CAPSULE | Freq: Every day | ORAL | Status: DC

## 2011-08-31 MED ORDER — METOPROLOL TARTRATE 50 MG PO TABS: 25.0000 mg | ORAL_TABLET | Freq: Two times a day (BID) | ORAL | Status: DC

## 2011-08-31 MED ORDER — INDAPAMIDE 2.5 MG PO TABS: 2.5000 mg | ORAL_TABLET | Freq: Every day | ORAL | Status: DC

## 2011-08-31 MED ORDER — LEVOTHYROXINE SODIUM 150 MCG PO TABS: 150.0000 ug | ORAL_TABLET | Freq: Every day | ORAL | Status: DC

## 2011-08-31 NOTE — Telephone Encounter (Signed)
Pt called and requested Indapamide 2.5 mg #90 x 3, Levothyroxine 150 mcg #90 x 3, Metoprolol 50 mg #90 x 3 and Omeprazole 20 mg #90 x 3 to be sent to [facility] service.

## 2011-08-31 NOTE — Telephone Encounter (Signed)
Spoke with pt; refills completed please see refill encounter 08/31/11.

## 2011-09-06 ENCOUNTER — Ambulatory Visit (INDEPENDENT_AMBULATORY_CARE_PROVIDER_SITE_OTHER): Payer: Medicare Other | Admitting: Family Medicine

## 2011-09-06 ENCOUNTER — Ambulatory Visit: Payer: Medicare Other | Admitting: Family Medicine

## 2011-09-06 DIAGNOSIS — I4891 Unspecified atrial fibrillation: Secondary | ICD-10-CM

## 2011-09-06 DIAGNOSIS — Z5181 Encounter for therapeutic drug level monitoring: Secondary | ICD-10-CM

## 2011-09-06 DIAGNOSIS — Z7901 Long term (current) use of anticoagulants: Secondary | ICD-10-CM

## 2011-09-06 LAB — POCT INR: INR: 3.2

## 2011-09-06 NOTE — Patient Instructions (Signed)
Continue current dose, check in 4 weeks  

## 2011-09-13 ENCOUNTER — Ambulatory Visit: Payer: Medicare Other | Admitting: Family Medicine

## 2011-10-03 ENCOUNTER — Encounter: Payer: Self-pay | Admitting: Endocrinology

## 2011-10-03 ENCOUNTER — Ambulatory Visit (INDEPENDENT_AMBULATORY_CARE_PROVIDER_SITE_OTHER): Payer: Medicare Other | Admitting: Endocrinology

## 2011-10-03 VITALS — BP 126/84 | HR 81 | Temp 97.2°F

## 2011-10-03 DIAGNOSIS — E119 Type 2 diabetes mellitus without complications: Secondary | ICD-10-CM

## 2011-10-03 MED ORDER — INSULIN DETEMIR 100 UNIT/ML ~~LOC~~ SOLN: 60.0000 [IU] | SUBCUTANEOUS | Status: DC

## 2011-10-03 NOTE — Patient Instructions (Addendum)
good diet and exercise habits significanly improve the control of your diabetes.  please let me know if you wish to be referred to a dietician.  high blood sugar is very risky to your health.  you should see an eye doctor every year. controlling your blood pressure and cholesterol drastically reduces the damage diabetes does to your body.  this also applies to quitting smoking.  please discuss these with your doctor.  you should take an aspirin every day, unless you have been advised by a doctor not to. check your blood sugar 2 times a day.  vary the time of day when you check, between before the 3 meals, and at bedtime.  also check if you have symptoms of your blood sugar being too high or too low.  please keep a record of the readings and bring it to your next appointment here.  please call us sooner if your blood sugar goes below 70, or if it stays over 200.   Start levemir, 60 units each morning.  Please come back for a follow-up appointment in 2 weeks

## 2011-10-03 NOTE — Progress Notes (Signed)
Subjective:    Patient ID: [name], female    DOB: 10-09-1922, 78 y.o.   MRN: [id number]  HPI pt states 25 years h/o dm.  she is unaware of any chronic complications.  She was started on lantus after ankle fx in 2011.  This was continued until 2012, when she developed 6 mos of moderate itching of the forearms, and assoc rash.  She has been off all medication for DM for a few mos.  Since off all meds, she says cbg's are persistently in the 300's.   pt says her diet is poor, and exercise is limited by health probs.   She says she is willing to take insulin only qd.   Past Medical History  Diagnosis Date  . Atrial fibrillation   . Diabetes mellitus     type II  . Anemia     Fe deficiency  . GERD (gastroesophageal reflux disease)   . Hyperlipidemia   . Hypertension   . Hypothyroid   . Obesity   . Vaginal dysplasia 1980  . Rosacea   . Chest pain 11/2002    cardiac cath neg   . Back pain   . IBS (irritable bowel syndrome)     with diarrhea  . Asthma     as a child  . Adenomatous colon polyp 12/2007  . Hemorrhoids   . Vertigo   . Hiatal hernia 12/2007    EGD    Past Surgical History  Procedure Date  . Abdominal hysterectomy 1963  . Cholecystectomy 2006  . Knee surgery     Rt TKR 1993 & Lt TKR 2001  . Cervical cone biopsy 1963    D&C  . Appendectomy 1941  . Cystocele repair 1978    /rectocele  . Orif ankle fracture 01/2010    fall/followed by rehab stay  . Cataract extraction 2006    bilateral    History   Social History  . Marital Status: Married    Spouse Name: N/A    Number of Children: N/A  . Years of Education: N/A   Occupational History  . Retired     Paramedic Work    Social History Main Topics  . Smoking status: Former Games developer  . Smokeless tobacco: Never Used  . Alcohol Use: No  . Drug Use: No  . Sexually Active: Not on file   Other Topics Concern  . Not on file   Social History Narrative  . No narrative on file    Current Outpatient  Prescriptions on File Prior to Visit  Medication Sig Dispense Refill  . acetaminophen (TYLENOL) 500 MG tablet OTC as directed.       . desonide (DESOWEN) 0.05 % cream Apply 1 application topically daily as needed.      . furosemide (LASIX) 20 MG tablet Take 20 mg by mouth daily.        Marland Kitchen glucose blood test strip Check blood sugar two times a day for DM 250.0 and as needed.       . indapamide (LOZOL) 2.5 MG tablet Take 1 tablet (2.5 mg total) by mouth daily.  90 tablet  3  . levothyroxine (SYNTHROID, LEVOTHROID) 150 MCG tablet Take 1 tablet (150 mcg total) by mouth daily.  90 tablet  3  . loperamide (IMODIUM A-D) 2 MG tablet Take 30-60 min before meals two to three times daily.  30 tablet    . methocarbamol (ROBAXIN) 500 MG tablet 1 tablet in AM and 1 tablet  in PM as needed.       . metoprolol (LOPRESSOR) 50 MG tablet Take 0.5 tablets (25 mg total) by mouth 2 (two) times daily.  90 tablet  3  . omeprazole (PRILOSEC) 20 MG capsule Take 1 capsule (20 mg total) by mouth daily.  90 capsule  3  . potassium chloride SA (K-DUR,KLOR-CON) 20 MEQ tablet Take 1 tablet (20 mEq total) by mouth 2 (two) times daily.  180 tablet  3  . simvastatin (ZOCOR) 40 MG tablet Take 1 tablet (40 mg total) by mouth at bedtime.  90 tablet  3  . traMADol (ULTRAM) 50 MG tablet Take 1-2 tabs by mouth up to every 8 hours as needed for pain  180 tablet  3  . warfarin (COUMADIN) 5 MG tablet As directed.  90 tablet  3  . ergocalciferol (VITAMIN D2) 50000 UNITS capsule Take 1 capsule (50,000 Units total) by mouth once a week. for 12 weeks  12 capsule  0  . HYDROcodone-acetaminophen (VICODIN ES) 7.5-750 MG per tablet Take 1 tablet by mouth every 6 (six) hours as needed.          Allergies  Allergen Reactions  . Lantus     Itching   . Latex     Rash   . Metformin And Related     Severe diarrhea   . Metoclopramide Hcl     REACTION: unspecified  . Nsaids     REACTION: unspecified  . Promethazine Hcl     REACTION:  unspecified    Family History  Problem Relation Age of Onset  . Cancer Mother     leukemia  . Coronary artery disease Mother   . Dementia Mother   . Heart disease Father     CAD  . COPD Sister   . Heart disease Brother   . Diabetes Brother   . Kidney disease Son   . Colon cancer Brother     BP 126/84  Pulse 81  Temp(Src) 97.2 F (36.2 C) (Oral)  SpO2 94%  LMP 09/01/1961  Review of Systems denies weight loss, headache, chest pain, sob, n/v, cramps, excessive diaphoresis, memory loss, depression, hypoglycemia, and rhinorrhea.  She is soon to see opthal for blurry vision.  She has chronic urinary incontinence and hearing loss.  She attributes easy bruising to coumadin.     Objective:   Physical Exam Vital signs: see vs page Gen: elderly, frail, no distress.  Obese.   In wheelchair HEAD: head: no deformity eyes: no periorbital swelling, no proptosis external nose and ears are normal mouth: no lesion seen NECK: supple, thyroid is not enlarged CHEST WALL: no deformity LUNGS:  Clear to auscultation CV: reg rate and rhythm, no murmur ABD: abdomen is soft, nontender.  no hepatosplenomegaly.  not distended.  no hernia MUSCULOSKELETAL: muscle bulk and strength are grossly normal.  no obvious joint swelling.  gait is normal and steady EXTEMITIES: no deformity.  no ulcer on the feet.  feet are of normal color and temp.  no edema.  The right great toenail, and the left 2nd toenail are absent.  There is bilateral onychomycosis PULSES: dorsalis pedis intact bilat.  no carotid bruit NEURO:  cn 2-12 grossly intact.   readily moves all 4's.  sensation is intact to touch on the feet SKIN:  Normal texture and temperature.  No rash or suspicious lesion is visible.   NODES:  None palpable at the neck PSYCH: alert, oriented x3.  Does not appear anxious nor  depressed.  Lab Results  Component Value Date   HGBA1C 7.5* 05/16/2011      Assessment & Plan:  DM. She needs insulin.  Skin sxs,  perceived due to lantus Blurry vision, possibly due to DM Chronic pain syndrome.  This limits exercise rx of DM. AF.  In view of this, hypoglycemia has a high potential risk to her health

## 2011-10-04 ENCOUNTER — Ambulatory Visit: Payer: Medicare Other

## 2011-10-04 ENCOUNTER — Encounter: Payer: Self-pay | Admitting: Family Medicine

## 2011-10-04 ENCOUNTER — Ambulatory Visit (INDEPENDENT_AMBULATORY_CARE_PROVIDER_SITE_OTHER): Payer: Medicare Other | Admitting: Family Medicine

## 2011-10-04 VITALS — BP 92/64 | HR 85 | Temp 97.5°F

## 2011-10-04 DIAGNOSIS — Z5181 Encounter for therapeutic drug level monitoring: Secondary | ICD-10-CM

## 2011-10-04 DIAGNOSIS — I10 Essential (primary) hypertension: Secondary | ICD-10-CM

## 2011-10-04 DIAGNOSIS — E559 Vitamin D deficiency, unspecified: Secondary | ICD-10-CM

## 2011-10-04 DIAGNOSIS — N644 Mastodynia: Secondary | ICD-10-CM

## 2011-10-04 DIAGNOSIS — I4891 Unspecified atrial fibrillation: Secondary | ICD-10-CM

## 2011-10-04 DIAGNOSIS — R1310 Dysphagia, unspecified: Secondary | ICD-10-CM | POA: Insufficient documentation

## 2011-10-04 DIAGNOSIS — E119 Type 2 diabetes mellitus without complications: Secondary | ICD-10-CM

## 2011-10-04 DIAGNOSIS — Z7901 Long term (current) use of anticoagulants: Secondary | ICD-10-CM

## 2011-10-04 LAB — RENAL FUNCTION PANEL
BUN: 18 mg/dL (ref 6–23)
CO2: 27 mEq/L (ref 19–32)
Chloride: 96 mEq/L (ref 96–112)
GFR: 44.35 mL/min — ABNORMAL LOW (ref >60.00)
Phosphorus: 3.3 mg/dL (ref 2.3–4.6)
Sodium: 139 mEq/L (ref 135–145)

## 2011-10-04 LAB — POCT INR: INR: 2.5

## 2011-10-04 NOTE — Progress Notes (Signed)
Subjective:    Patient ID: [name], female    DOB: 12/27/1922, 78 y.o.   MRN: [id number]  HPI Here for f/u chronic conditions  Also is having a breast problem L breast very tender and sore - so much that it kept her from sleeping at night Called her gyn office (had not been seen in years)  She does some self exam- and she notices lumps that comes and goes  Told to see primary care physician - needs breast check   2 years since last mammogram Does not think she could tolerate the test   Is having problems with swallowing - gets strangled all the time "over nothing"  Happens with food and liquids  Is spitting/ throwing back up  Is on omeprazole No heartburn     bp is 92/64    Today-- feels ok with that  No cp or palpitations or headaches or edema  No side effects to medicines   Put back on lasix last visit for swelling   Was ref to endo for DM - saw the doctor yesterday and it went well  Needs a1c today  Gave her another drug - levemir - supposed to start today - in am  All lantus and intol of oral tx  She is not compliant with diet    Vit D- given high dose- is done with that - no problem Due for check  hypothy Lab Results  Component Value Date   TSH 0.90 01/21/2011   DR ellison did examine her thyroid - per her no comment  Lab Results  Component Value Date   CHOL 152 01/21/2011   HDL 36.90* 01/21/2011   LDLCALC 91 01/21/2011   LDLDIRECT 160.8 09/22/2010   TRIG 122.0 01/21/2011   CHOLHDL 4 01/21/2011   on zocor Diet not optimal  Patient Active Problem List  Diagnoses  . ADENOMATOUS COLONIC POLYP  . HYPOTHYROIDISM  . DIABETES MELLITUS, TYPE II  . HYPERLIPIDEMIA  . HYPERTENSION  . ATRIAL FIBRILLATION  . CHRONIC RHINITIS  . GERD  . HIATAL HERNIA  . IRRITABLE BOWEL SYNDROME  . ROSACEA  . INTERTRIGO  . OSTEOPOROSIS  . UNSPECIFIED VITAMIN D DEFICIENCY  . Lump in the abdomen  . Fatigue  . B12 deficiency  . Pain of breast  . Dysphagia   Past  Medical History  Diagnosis Date  . Atrial fibrillation   . Diabetes mellitus     type II  . Anemia     Fe deficiency  . GERD (gastroesophageal reflux disease)   . Hyperlipidemia   . Hypertension   . Hypothyroid   . Obesity   . Vaginal dysplasia 1980  . Rosacea   . Chest pain 11/2002    cardiac cath neg   . Back pain   . IBS (irritable bowel syndrome)     with diarrhea  . Asthma     as a child  . Adenomatous colon polyp 12/2007  . Hemorrhoids   . Vertigo   . Hiatal hernia 12/2007    EGD   Past Surgical History  Procedure Date  . Abdominal hysterectomy 1963  . Cholecystectomy 2006  . Knee surgery     Rt TKR 1993 & Lt TKR 2001  . Cervical cone biopsy 1963    D&C  . Appendectomy 1941  . Cystocele repair 1978    /rectocele  . Orif ankle fracture 01/2010    fall/followed by rehab stay  . Cataract extraction 2006  bilateral   History  Substance Use Topics  . Smoking status: Former Games developer  . Smokeless tobacco: Never Used  . Alcohol Use: No   Family History  Problem Relation Age of Onset  . Cancer Mother     leukemia  . Coronary artery disease Mother   . Dementia Mother   . Heart disease Father     CAD  . COPD Sister   . Heart disease Brother   . Diabetes Brother   . Kidney disease Son   . Colon cancer Brother    Allergies  Allergen Reactions  . Lantus     Itching   . Latex     Rash   . Metformin And Related     Severe diarrhea   . Metoclopramide Hcl     REACTION: unspecified  . Nsaids     REACTION: unspecified  . Promethazine Hcl     REACTION: unspecified   Current Outpatient Prescriptions on File Prior to Visit  Medication Sig Dispense Refill  . acetaminophen (TYLENOL) 500 MG tablet OTC as directed.       . desonide (DESOWEN) 0.05 % cream Apply 1 application topically daily as needed.      . ergocalciferol (VITAMIN D2) 50000 UNITS capsule Take 1 capsule (50,000 Units total) by mouth once a week. for 12 weeks  12 capsule  0  . furosemide  (LASIX) 20 MG tablet Take 20 mg by mouth daily.        Marland Kitchen glucose blood test strip Check blood sugar two times a day for DM 250.0 and as needed.       Marland Kitchen HYDROcodone-acetaminophen (VICODIN ES) 7.5-750 MG per tablet Take 1 tablet by mouth every 6 (six) hours as needed.        . indapamide (LOZOL) 2.5 MG tablet Take 1 tablet (2.5 mg total) by mouth daily.  90 tablet  3  . insulin detemir (LEVEMIR FLEXPEN) 100 UNIT/ML injection Inject 60 Units into the skin every morning. And pen needles, 1/day  30 mL  12  . levothyroxine (SYNTHROID, LEVOTHROID) 150 MCG tablet Take 1 tablet (150 mcg total) by mouth daily.  90 tablet  3  . loperamide (IMODIUM A-D) 2 MG tablet Take 30-60 min before meals two to three times daily.  30 tablet    . methocarbamol (ROBAXIN) 500 MG tablet 1 tablet in AM and 1 tablet in PM as needed.       . metoprolol (LOPRESSOR) 50 MG tablet Take 0.5 tablets (25 mg total) by mouth 2 (two) times daily.  90 tablet  3  . omeprazole (PRILOSEC) 20 MG capsule Take 1 capsule (20 mg total) by mouth daily.  90 capsule  3  . potassium chloride SA (K-DUR,KLOR-CON) 20 MEQ tablet Take 1 tablet (20 mEq total) by mouth 2 (two) times daily.  180 tablet  3  . simvastatin (ZOCOR) 40 MG tablet Take 1 tablet (40 mg total) by mouth at bedtime.  90 tablet  3  . traMADol (ULTRAM) 50 MG tablet Take 1-2 tabs by mouth up to every 8 hours as needed for pain  180 tablet  3  . warfarin (COUMADIN) 5 MG tablet As directed.  90 tablet  3       Review of Systems Review of Systems  Constitutional: Negative for fever, appetite change, and unexpected weight change. pos for chronic fatigue  Eyes: Negative for pain and visual disturbance.  Respiratory: Negative for cough and shortness of breath.  baseline poor exercise  tolerance - uses wheelchair in home often due to obesity and arthritis  Cardiovascular: Negative for cp or palpitations    Gastrointestinal: Negative for nausea, diarrhea and constipation. neg for heartburn,  pos for trouble swallowing  Genitourinary: Negative for urgency and frequency. pos for breast soreness  Skin: Negative for pallor or rash   MSK pos for chronic back and joint pain  Neurological: Negative for weakness, light-headedness, numbness and headaches.  Hematological: Negative for adenopathy. Does not bruise/bleed easily.  Psychiatric/Behavioral: Negative for dysphoric mood. The patient is not nervous/anxious.          Objective:   Physical Exam  Constitutional: She appears well-developed and well-nourished. No distress.       Obese elderly female in wheelchair   HENT:  Head: Normocephalic and atraumatic.  Mouth/Throat: Oropharynx is clear and moist. No oropharyngeal exudate.  Eyes: Conjunctivae and EOM are normal. Pupils are equal, round, and reactive to light. No scleral icterus.  Neck: Normal range of motion. Neck supple. No JVD present. Carotid bruit is not present. No thyromegaly present.  Cardiovascular: Normal rate, regular rhythm, normal heart sounds and intact distal pulses.  Exam reveals no gallop.   Pulmonary/Chest: Effort normal and breath sounds normal. No respiratory distress. She has no wheezes.  Abdominal: Soft. Bowel sounds are normal. She exhibits no distension and no mass. There is no tenderness.  Musculoskeletal: Normal range of motion. She exhibits edema. She exhibits no tenderness.       One plus pitting edema pedal  Lymphadenopathy:    She has no cervical adenopathy.  Neurological: She is alert. She has normal reflexes. No cranial nerve deficit. She exhibits normal muscle tone. Coordination normal.  Skin: Skin is warm and dry. No rash noted. No erythema. No pallor.  Psychiatric:       Seems generally anxious Poor attentiveness and tangential in speech Not tearful          Assessment & Plan:

## 2011-10-04 NOTE — Assessment & Plan Note (Signed)
Low normal- pt tolerates well  Feels better back on lasix for edema  Renal panel today

## 2011-10-04 NOTE — Assessment & Plan Note (Signed)
This is ongoing for months- worsening No GERD sympt- on PPI  I adv pt that she needs to see GI for probable endoscopy or swallowing study- do suspect esoph stricture Due to transportation issues- not ready for that yet- but will call when ready to sched Adv to chew food carefully and eat slowly

## 2011-10-04 NOTE — Assessment & Plan Note (Signed)
Seeing Dr Everardo All  On levemir to start today  a1c with labs today  Eye exam later this month  Does not follow diet

## 2011-10-04 NOTE — Assessment & Plan Note (Signed)
Finished 12 week high dose tx Level today

## 2011-10-04 NOTE — Patient Instructions (Signed)
Continue 2.5 mg daily, recheck 4 weeks 

## 2011-10-04 NOTE — Assessment & Plan Note (Signed)
L breast pain/ tenderness Reassuring exam Pt does not want to do mammo at this time- though I stressed its importance  Adv her to call back when ready to schedule it  Will keep me updated

## 2011-10-04 NOTE — Patient Instructions (Signed)
Labs today- for sugar and vitamin D and electrolytes  You need a mammogram - so call us when you are ready to schedule that (especially if breast soreness persists)  Blood pressure is ok  I think you need to see the GI doctor (gastroenterology ) for the trouble swallowing - this is important , so call us when you want [place] to refer you

## 2011-10-05 LAB — VITAMIN D 25 HYDROXY (VIT D DEFICIENCY, FRACTURES): Vit D, 25-Hydroxy: 35 ng/mL (ref 30–89)

## 2011-10-11 ENCOUNTER — Emergency Department (HOSPITAL_COMMUNITY): Payer: Medicare Other

## 2011-10-11 ENCOUNTER — Encounter (HOSPITAL_COMMUNITY): Payer: Self-pay | Admitting: Emergency Medicine

## 2011-10-11 ENCOUNTER — Other Ambulatory Visit: Payer: Self-pay

## 2011-10-11 ENCOUNTER — Inpatient Hospital Stay (HOSPITAL_COMMUNITY)
Admission: EM | Admit: 2011-10-11 | Discharge: 2011-10-20 | DRG: 871 | Disposition: A | Discharge status: Home / Self Care | Payer: Medicare Other | Attending: Internal Medicine | Admitting: Internal Medicine

## 2011-10-11 DIAGNOSIS — E039 Hypothyroidism, unspecified: Secondary | ICD-10-CM | POA: Diagnosis present

## 2011-10-11 DIAGNOSIS — E1165 Type 2 diabetes mellitus with hyperglycemia: Secondary | ICD-10-CM | POA: Diagnosis present

## 2011-10-11 DIAGNOSIS — R531 Weakness: Secondary | ICD-10-CM | POA: Diagnosis present

## 2011-10-11 DIAGNOSIS — J069 Acute upper respiratory infection, unspecified: Secondary | ICD-10-CM

## 2011-10-11 DIAGNOSIS — E119 Type 2 diabetes mellitus without complications: Secondary | ICD-10-CM | POA: Diagnosis present

## 2011-10-11 DIAGNOSIS — I5023 Acute on chronic systolic (congestive) heart failure: Secondary | ICD-10-CM | POA: Diagnosis present

## 2011-10-11 DIAGNOSIS — N390 Urinary tract infection, site not specified: Secondary | ICD-10-CM | POA: Diagnosis present

## 2011-10-11 DIAGNOSIS — J101 Influenza due to other identified influenza virus with other respiratory manifestations: Secondary | ICD-10-CM

## 2011-10-11 DIAGNOSIS — Z7901 Long term (current) use of anticoagulants: Secondary | ICD-10-CM

## 2011-10-11 DIAGNOSIS — R5381 Other malaise: Secondary | ICD-10-CM | POA: Diagnosis present

## 2011-10-11 DIAGNOSIS — J209 Acute bronchitis, unspecified: Secondary | ICD-10-CM | POA: Diagnosis present

## 2011-10-11 DIAGNOSIS — Z87891 Personal history of nicotine dependence: Secondary | ICD-10-CM

## 2011-10-11 DIAGNOSIS — Z833 Family history of diabetes mellitus: Secondary | ICD-10-CM

## 2011-10-11 DIAGNOSIS — I4891 Unspecified atrial fibrillation: Secondary | ICD-10-CM | POA: Diagnosis present

## 2011-10-11 DIAGNOSIS — H8110 Benign paroxysmal vertigo, unspecified ear: Secondary | ICD-10-CM | POA: Diagnosis present

## 2011-10-11 DIAGNOSIS — J111 Influenza due to unidentified influenza virus with other respiratory manifestations: Secondary | ICD-10-CM | POA: Diagnosis present

## 2011-10-11 DIAGNOSIS — I509 Heart failure, unspecified: Secondary | ICD-10-CM | POA: Diagnosis present

## 2011-10-11 DIAGNOSIS — N644 Mastodynia: Secondary | ICD-10-CM | POA: Diagnosis present

## 2011-10-11 DIAGNOSIS — R000 Tachycardia, unspecified: Secondary | ICD-10-CM | POA: Diagnosis present

## 2011-10-11 DIAGNOSIS — I10 Essential (primary) hypertension: Secondary | ICD-10-CM | POA: Diagnosis present

## 2011-10-11 DIAGNOSIS — A419 Sepsis, unspecified organism: Principal | ICD-10-CM | POA: Diagnosis present

## 2011-10-11 DIAGNOSIS — J440 Chronic obstructive pulmonary disease with acute lower respiratory infection: Secondary | ICD-10-CM | POA: Diagnosis present

## 2011-10-11 DIAGNOSIS — R509 Fever, unspecified: Secondary | ICD-10-CM | POA: Diagnosis present

## 2011-10-11 DIAGNOSIS — IMO0002 Reserved for concepts with insufficient information to code with codable children: Secondary | ICD-10-CM | POA: Diagnosis present

## 2011-10-11 LAB — POCT I-STAT 3, ART BLOOD GAS (G3+)
Acid-Base Excess: 1 mmol/L (ref 0.0–2.0)
O2 Saturation: 95 %
pO2, Arterial: 82 mmHg (ref 80.0–100.0)

## 2011-10-11 LAB — COMPREHENSIVE METABOLIC PANEL
ALT: 19 U/L (ref 0–35)
Albumin: 3.5 g/dL (ref 3.5–5.2)
Alkaline Phosphatase: 40 U/L (ref 39–117)
BUN: 18 mg/dL (ref 6–23)
Chloride: 97 mEq/L (ref 96–112)
GFR calc Af Amer: 61 mL/min — ABNORMAL LOW (ref >90)
Glucose, Bld: 242 mg/dL — ABNORMAL HIGH (ref 70–99)
Potassium: 3.7 mEq/L (ref 3.5–5.1)
Total Bilirubin: 0.8 mg/dL (ref 0.3–1.2)

## 2011-10-11 LAB — URINE MICROSCOPIC-ADD ON

## 2011-10-11 LAB — URINALYSIS, ROUTINE W REFLEX MICROSCOPIC
Ketones, ur: 15 mg/dL — AB
Nitrite: NEGATIVE
pH: 5.5 (ref 5.0–8.0)

## 2011-10-11 LAB — DIFFERENTIAL
Lymphs Abs: 0.5 10*3/uL — ABNORMAL LOW (ref 0.7–4.0)
Monocytes Relative: 16 % — ABNORMAL HIGH (ref 3–12)
Neutro Abs: 5.1 10*3/uL (ref 1.7–7.7)
Neutrophils Relative %: 75 % (ref 43–77)

## 2011-10-11 LAB — CBC
Hemoglobin: 13.6 g/dL (ref 12.0–15.0)
RBC: 4.31 MIL/uL (ref 3.87–5.11)
WBC: 6.8 10*3/uL (ref 4.0–10.5)

## 2011-10-11 LAB — TROPONIN I: Troponin I: <0.3 ng/mL (ref <0.30)

## 2011-10-11 LAB — APTT: aPTT: 36 seconds (ref 24–37)

## 2011-10-11 MED ORDER — ONDANSETRON HCL 4 MG/2ML IJ SOLN
INTRAMUSCULAR | Status: AC
  Filled 2011-10-11: qty 2

## 2011-10-11 MED ORDER — DEXTROSE 5 % IV SOLN
1.0000 g | INTRAVENOUS | Status: DC
  Administered 2011-10-13 – 2011-10-18 (×6): 1 g via INTRAVENOUS
  Filled 2011-10-11 (×8): qty 10

## 2011-10-11 MED ORDER — DEXTROSE 5 % IV SOLN
1.0000 g | Freq: Once | INTRAVENOUS | Status: AC
  Administered 2011-10-11: 1 g via INTRAVENOUS
  Filled 2011-10-11: qty 10

## 2011-10-11 MED ORDER — ONDANSETRON HCL 4 MG PO TABS: 4.0000 mg | ORAL_TABLET | Freq: Four times a day (QID) | ORAL | Status: DC | PRN

## 2011-10-11 MED ORDER — PANTOPRAZOLE SODIUM 40 MG PO TBEC
40.0000 mg | DELAYED_RELEASE_TABLET | Freq: Every day | ORAL | Status: DC
  Administered 2011-10-11 – 2011-10-20 (×10): 40 mg via ORAL
  Filled 2011-10-11 (×8): qty 1

## 2011-10-11 MED ORDER — ALBUTEROL SULFATE (5 MG/ML) 0.5% IN NEBU
5.0000 mg | INHALATION_SOLUTION | Freq: Once | RESPIRATORY_TRACT | Status: AC
  Administered 2011-10-11: 5 mg via RESPIRATORY_TRACT
  Filled 2011-10-11: qty 1

## 2011-10-11 MED ORDER — ONDANSETRON HCL 4 MG/2ML IJ SOLN: 4.0000 mg | Freq: Four times a day (QID) | INTRAMUSCULAR | Status: DC | PRN

## 2011-10-11 MED ORDER — METHOCARBAMOL 500 MG PO TABS
500.0000 mg | ORAL_TABLET | Freq: Three times a day (TID) | ORAL | Status: DC | PRN
  Filled 2011-10-11: qty 1

## 2011-10-11 MED ORDER — METOPROLOL TARTRATE 25 MG PO TABS
25.0000 mg | ORAL_TABLET | Freq: Two times a day (BID) | ORAL | Status: DC
  Administered 2011-10-11 – 2011-10-20 (×18): 25 mg via ORAL
  Filled 2011-10-11 (×20): qty 1

## 2011-10-11 MED ORDER — LEVOTHYROXINE SODIUM 150 MCG PO TABS
150.0000 ug | ORAL_TABLET | Freq: Every day | ORAL | Status: DC
  Administered 2011-10-12 – 2011-10-20 (×9): 150 ug via ORAL
  Filled 2011-10-11 (×10): qty 1

## 2011-10-11 MED ORDER — ACETAMINOPHEN 650 MG RE SUPP: 650.0000 mg | Freq: Four times a day (QID) | RECTAL | Status: DC | PRN

## 2011-10-11 MED ORDER — MECLIZINE HCL 25 MG PO TABS
25.0000 mg | ORAL_TABLET | Freq: Three times a day (TID) | ORAL | Status: DC | PRN
  Administered 2011-10-19 – 2011-10-20 (×2): 25 mg via ORAL
  Filled 2011-10-11 (×3): qty 1

## 2011-10-11 MED ORDER — BIOTENE DRY MOUTH MT LIQD
15.0000 mL | Freq: Two times a day (BID) | OROMUCOSAL | Status: DC
  Administered 2011-10-11 – 2011-10-20 (×16): 15 mL via OROMUCOSAL

## 2011-10-11 MED ORDER — WARFARIN SODIUM 5 MG PO TABS
5.0000 mg | ORAL_TABLET | ORAL | Status: AC
  Administered 2011-10-11: 5 mg via ORAL
  Filled 2011-10-11 (×2): qty 1

## 2011-10-11 MED ORDER — SODIUM CHLORIDE 0.9 % IJ SOLN
3.0000 mL | Freq: Two times a day (BID) | INTRAMUSCULAR | Status: DC
  Administered 2011-10-11: 3 mL via INTRAVENOUS

## 2011-10-11 MED ORDER — LEVOTHYROXINE SODIUM 150 MCG PO TABS: 150.0000 ug | ORAL_TABLET | Freq: Every day | ORAL | Status: DC

## 2011-10-11 MED ORDER — SODIUM CHLORIDE 0.9 % IV BOLUS (SEPSIS)
500.0000 mL | Freq: Once | INTRAVENOUS | Status: AC
  Administered 2011-10-11: 500 mL via INTRAVENOUS

## 2011-10-11 MED ORDER — SODIUM CHLORIDE 0.9 % IJ SOLN
3.0000 mL | Freq: Two times a day (BID) | INTRAMUSCULAR | Status: DC
  Administered 2011-10-12 – 2011-10-20 (×17): 3 mL via INTRAVENOUS

## 2011-10-11 MED ORDER — INSULIN DETEMIR 100 UNIT/ML ~~LOC~~ SOLN
60.0000 [IU] | SUBCUTANEOUS | Status: DC
  Administered 2011-10-12 – 2011-10-19 (×8): 60 [IU] via SUBCUTANEOUS
  Filled 2011-10-11: qty 3

## 2011-10-11 MED ORDER — ACETAMINOPHEN 325 MG PO TABS
650.0000 mg | ORAL_TABLET | Freq: Once | ORAL | Status: AC
  Administered 2011-10-11: 650 mg via ORAL
  Filled 2011-10-11 (×2): qty 1

## 2011-10-11 MED ORDER — TRAMADOL HCL 50 MG PO TABS
50.0000 mg | ORAL_TABLET | Freq: Three times a day (TID) | ORAL | Status: DC | PRN
  Filled 2011-10-11: qty 1

## 2011-10-11 MED ORDER — WARFARIN - PHARMACIST DOSING INPATIENT: Freq: Every day | Status: DC

## 2011-10-11 MED ORDER — METOPROLOL TARTRATE 25 MG PO TABS
25.0000 mg | ORAL_TABLET | Freq: Once | ORAL | Status: AC
  Administered 2011-10-11: 25 mg via ORAL
  Filled 2011-10-11: qty 1

## 2011-10-11 MED ORDER — ACETAMINOPHEN 325 MG PO TABS
650.0000 mg | ORAL_TABLET | Freq: Four times a day (QID) | ORAL | Status: DC | PRN
  Administered 2011-10-14 (×2): 650 mg via ORAL
  Administered 2011-10-18 – 2011-10-19 (×2): 325 mg via ORAL
  Filled 2011-10-11: qty 1
  Filled 2011-10-11 (×3): qty 2

## 2011-10-11 NOTE — Progress Notes (Signed)
ANTICOAGULATION CONSULT NOTE - Initial Consult  Pharmacy Consult for Warfarin Indication: atrial fibrillation  Allergies  Allergen Reactions  . Lantus     Itching   . Latex     Rash   . Metformin And Related     Severe diarrhea   . Metoclopramide Hcl     REACTION: unspecified  . Nsaids     REACTION: unspecified  . Promethazine Hcl     REACTION: unspecified    Patient Measurements:    Vital Signs: Temp: 98 F (36.7 C) (03/12 2200) Temp src: Oral (03/12 2200) BP: 113/53 mmHg (03/12 2200) Pulse Rate: 122  (03/12 2200)  Labs:  [facility] 10/11/11 1622  HGB 13.6  HCT 40.3  PLT 185  APTT 36  LABPROT 18.1*  INR 1.47  HEPARINUNFRC --  CREATININE 0.95  CKTOTAL --  CKMB --  TROPONINI <0.30   The CrCl is unknown because both a height and weight (above a minimum accepted value) are required for this calculation.  Medical History: Past Medical History  Diagnosis Date  . Atrial fibrillation   . Diabetes mellitus     type II  . Anemia     Fe deficiency  . GERD (gastroesophageal reflux disease)   . Hyperlipidemia   . Hypertension   . Hypothyroid   . Obesity   . Vaginal dysplasia 1980  . Rosacea   . Chest pain 11/2002    cardiac cath neg   . Back pain   . IBS (irritable bowel syndrome)     with diarrhea  . Asthma     as a child  . Adenomatous colon polyp 12/2007  . Hemorrhoids   . Vertigo   . Hiatal hernia 12/2007    EGD    Medications:    (Not in a hospital admission)   Admit Complaint: [name] with afib admitted 10/11/11 for increasing weakness and leathargy.  Pharmacy consulted to dose warfarin.  Home warfarin dose 2.5 mg daily, admission INR subtherapeutic.  Assessment: Anticoagulation: Atrial fibrillation, continue warfarin.  Goal of Therapy:  INR 2-3   Plan:  1. Warfarin 5 mg PO x 1 tonight 2. Daily INR.  [name] [name] 10/11/2011,10:06 PM

## 2011-10-11 NOTE — ED Provider Notes (Signed)
History     CSN: [id number]  Arrival date & time 10/11/11  1505   First MD Initiated Contact with Patient 10/11/11 1526     Level 5 Chief Complaint  Patient presents with  . Nausea  . Emesis  . Altered Mental Status    (Consider location/radiation/quality/duration/timing/severity/associated sxs/prior treatment) HPI  Patient with nausea and vomiting began vomiting at noon today.  Patient unable to get up today because she hurts all over.  Patient with history of back pain and leg pain for years.  Patient lives with husband.  Son is here and is helping with history.  Patient states she normally takes two tramadol and two tylenol.  Patient did not take today, because it hurt too bad.  Patient vomited two times.  Patient saw Dr. Milinda Antis for check up last week.   Past Medical History  Diagnosis Date  . Atrial fibrillation   . Diabetes mellitus     type II  . Anemia     Fe deficiency  . GERD (gastroesophageal reflux disease)   . Hyperlipidemia   . Hypertension   . Hypothyroid   . Obesity   . Vaginal dysplasia 1980  . Rosacea   . Chest pain 11/2002    cardiac cath neg   . Back pain   . IBS (irritable bowel syndrome)     with diarrhea  . Asthma     as a child  . Adenomatous colon polyp 12/2007  . Hemorrhoids   . Vertigo   . Hiatal hernia 12/2007    EGD    Past Surgical History  Procedure Date  . Abdominal hysterectomy 1963  . Cholecystectomy 2006  . Knee surgery     Rt TKR 1993 & Lt TKR 2001  . Cervical cone biopsy 1963    D&C  . Appendectomy 1941  . Cystocele repair 1978    /rectocele  . Orif ankle fracture 01/2010    fall/followed by rehab stay  . Cataract extraction 2006    bilateral    Family History  Problem Relation Age of Onset  . Cancer Mother     leukemia  . Coronary artery disease Mother   . Dementia Mother   . Heart disease Father     CAD  . COPD Sister   . Heart disease Brother   . Diabetes Brother   . Kidney disease Son   . Colon cancer  Brother     History  Substance Use Topics  . Smoking status: Former Games developer  . Smokeless tobacco: Never Used  . Alcohol Use: No    OB History    Grav Para Term Preterm Abortions TAB SAB Ect Mult Living                  Review of Systems  Unable to perform ROS   Allergies  Lantus; Latex; Metformin and related; Metoclopramide hcl; Nsaids; and Promethazine hcl  Home Medications   Current Outpatient Rx  Name Route Sig Dispense Refill  . ACETAMINOPHEN 500 MG PO TABS  OTC as directed.     Marland Kitchen ERGOCALCIFEROL 50000 UNITS PO CAPS Oral Take 1 capsule (50,000 Units total) by mouth once a week. for 12 weeks 12 capsule 0  . INDAPAMIDE 2.5 MG PO TABS Oral Take 1 tablet (2.5 mg total) by mouth daily. 90 tablet 3  . INSULIN DETEMIR 100 UNIT/ML [place] SOLN Subcutaneous Inject 60 Units into the skin every morning. And pen needles, 1/day 30 mL 12  . LEVOTHYROXINE  SODIUM 150 MCG PO TABS Oral Take 1 tablet (150 mcg total) by mouth daily. 90 tablet 3  . LOPERAMIDE HCL 2 MG PO TABS  Take 30-60 min before meals two to three times daily. 30 tablet   . METHOCARBAMOL 500 MG PO TABS  1 tablet in AM and 1 tablet in PM as needed.     Marland Kitchen METOPROLOL TARTRATE 50 MG PO TABS Oral Take 0.5 tablets (25 mg total) by mouth 2 (two) times daily. 90 tablet 3  . OMEPRAZOLE 20 MG PO CPDR Oral Take 1 capsule (20 mg total) by mouth daily. 90 capsule 3  . POTASSIUM CHLORIDE CRYS ER 20 MEQ PO TBCR Oral Take 1 tablet (20 mEq total) by mouth 2 (two) times daily. 180 tablet 3  . TRAMADOL HCL 50 MG PO TABS  Take 1-2 tabs by mouth up to every 8 hours as needed for pain 180 tablet 3  . WARFARIN SODIUM 5 MG PO TABS  As directed. 90 tablet 3    BP 152/89  Pulse 124  Temp(Src) 100.6 F (38.1 C) (Oral)  Resp 16  SpO2 98%  LMP 09/01/1961  Physical Exam  Nursing note and vitals reviewed. Constitutional: She is oriented to person, place, and time. She appears well-developed.       Morbidly obese  HENT:  Head: Normocephalic and  atraumatic.  Eyes: Conjunctivae are normal. Pupils are equal, round, and reactive to light.  Neck: Normal range of motion. Neck supple.  Cardiovascular: An irregularly irregular rhythm present. Tachycardia present.   Pulmonary/Chest: She has wheezes.  Abdominal: Soft. Bowel sounds are normal.  Musculoskeletal: Normal range of motion.  Neurological: She is alert and oriented to person, place, and time.       Patient with somnolence but awakens with questions although falls back to sleep.    Skin: Skin is warm and dry.    ED Course  Procedures (including critical care time)  Labs Reviewed - No data to display No results found.   No diagnosis found.   Date: 10/11/2011  Rate: 102  Rhythm: atrial fibrillation  QRS Axis: normal  Intervals: normal  ST/T Wave abnormalities: nonspecific ST/T changes  Conduction Disutrbances:afib  Narrative Interpretation:   Old EKG Reviewed: patient previously in a fib wit rate 63  Results for orders placed during the hospital encounter of 10/11/11  CBC      Component Value Range   WBC 6.8  4.0 - 10.5 (K/uL)   RBC 4.31  3.87 - 5.11 (MIL/uL)   Hemoglobin 13.6  12.0 - 15.0 (g/dL)   HCT 16.1  09.6 - 04.5 (%)   MCV 93.5  78.0 - 100.0 (fL)   MCH 31.6  26.0 - 34.0 (pg)   MCHC 33.7  30.0 - 36.0 (g/dL)   RDW 40.9  81.1 - 91.4 (%)   Platelets 185  150 - 400 (K/uL)  DIFFERENTIAL      Component Value Range   Neutrophils Relative 75  43 - 77 (%)   Neutro Abs 5.1  1.7 - 7.7 (K/uL)   Lymphocytes Relative 8 (*) 12 - 46 (%)   Lymphs Abs 0.5 (*) 0.7 - 4.0 (K/uL)   Monocytes Relative 16 (*) 3 - 12 (%)   Monocytes Absolute 1.1 (*) 0.1 - 1.0 (K/uL)   Eosinophils Relative 0  0 - 5 (%)   Eosinophils Absolute 0.0  0.0 - 0.7 (K/uL)   Basophils Relative 1  0 - 1 (%)   Basophils Absolute 0.0  0.0 - 0.1 (K/uL)  COMPREHENSIVE METABOLIC PANEL      Component Value Range   Sodium 135  135 - 145 (mEq/L)   Potassium 3.7  3.5 - 5.1 (mEq/L)   Chloride 97  96 - 112  (mEq/L)   CO2 25  19 - 32 (mEq/L)   Glucose, Bld 242 (*) 70 - 99 (mg/dL)   BUN 18  6 - 23 (mg/dL)   Creatinine, Ser 8.11  0.50 - 1.10 (mg/dL)   Calcium 9.1  8.4 - 91.4 (mg/dL)   Total Protein 7.1  6.0 - 8.3 (g/dL)   Albumin 3.5  3.5 - 5.2 (g/dL)   AST 38 (*) 0 - 37 (U/L)   ALT 19  0 - 35 (U/L)   Alkaline Phosphatase 40  39 - 117 (U/L)   Total Bilirubin 0.8  0.3 - 1.2 (mg/dL)   GFR calc non Af Amer 53 (*) >90 (mL/min)   GFR calc Af Amer 61 (*) >90 (mL/min)  TROPONIN I      Component Value Range   Troponin I <0.30  <0.30 (ng/mL)  URINALYSIS, ROUTINE W REFLEX MICROSCOPIC      Component Value Range   Color, Urine YELLOW  YELLOW    APPearance CLOUDY (*) CLEAR    Specific Gravity, Urine 1.020  1.005 - 1.030    pH 5.5  5.0 - 8.0    Glucose, UA 250 (*) NEGATIVE (mg/dL)   Hgb urine dipstick MODERATE (*) NEGATIVE    Bilirubin Urine SMALL (*) NEGATIVE    Ketones, ur 15 (*) NEGATIVE (mg/dL)   Protein, ur 30 (*) NEGATIVE (mg/dL)   Urobilinogen, UA 1.0  0.0 - 1.0 (mg/dL)   Nitrite NEGATIVE  NEGATIVE    Leukocytes, UA MODERATE (*) NEGATIVE   PROTIME-INR      Component Value Range   Prothrombin Time 18.1 (*) 11.6 - 15.2 (seconds)   INR 1.47  0.00 - 1.49   APTT      Component Value Range   aPTT 36  24 - 37 (seconds)  URINE MICROSCOPIC-ADD ON      Component Value Range   Squamous Epithelial / LPF RARE  RARE    WBC, UA 11-20  <3 (WBC/hpf)   RBC / HPF 0-2  <3 (RBC/hpf)   Bacteria, UA MANY (*) RARE    Urine-Other MANY YEAST    POCT I-STAT 3, BLOOD GAS (G3+)      Component Value Range   pH, Arterial 7.355  7.350 - 7.400    pCO2 arterial 49.1 (*) 35.0 - 45.0 (mmHg)   pO2, Arterial 82.0  80.0 - 100.0 (mmHg)   Bicarbonate 27.4 (*) 20.0 - 24.0 (mEq/L)   TCO2 29  0 - 100 (mmol/L)   O2 Saturation 95.0     Acid-Base Excess 1.0  0.0 - 2.0 (mmol/L)   Collection site RADIAL, ALLEN'S TEST ACCEPTABLE     Drawn by Operator     Sample type ARTERIAL        MDM  The patient was coughing here  on exam and some wheezing. Her son gives a history that the patient's husband has had a URI for the past 4-5 days. She also has some white blood cells in her urine which is being cultured. She easily arousable and is oriented but continues to be very sleepy and has generalized weakness although no focal deficit. Patient received Rocephin 1 g IV to cover for urinary pathogens. I feel this is likely secondary to the viral upper  respiratory infection and fever. However, given her age, she will require nursing care. Patient's atrial fibrillation is chronic but her rate here is elevated at 102 on her EKG. Her INR is subtherapeutic    Patient's care discussed with hospitalist. She will be admitted to team 5 to a telemetry bed.    Hilario Quarry, MD 10/11/11 [phone]

## 2011-10-11 NOTE — ED Notes (Signed)
In and out cath completed using sterile technique. Dark urine returned. Assisted by Orlena Sheldon and Warden Fillers, EMT

## 2011-10-11 NOTE — ED Notes (Signed)
Pt began with N/V this morning. Pt's family reports that she developed altered LOC earlier today.  They also advised that the pt has had "pain all over" for 5 weeks. A/O x4

## 2011-10-11 NOTE — ED Notes (Signed)
Pt given bag lunch and sprite. NAD.  No verbal complaints at this time. Will continue to monitor while awaiting bed assignment.

## 2011-10-11 NOTE — H&P (Signed)
[name] is an 78 y.o. female.   PCP - Dr.Tower. Chief Complaint: Generalized weakness. HPI: 78 year-old female with history of diabetes mellitus type 2, atrial fibrillation on Coumadin, hypertension, hypothyroidism was brought to the ER by patient's son as patient was found to be increasingly weak and lethargic since morning. Patient has not not been eating well since last evening and has been having some cough. Patient usually walks minimal with help of a walker. Since patient's condition did not improve patient was brought to the ER. In the ER patient was found to be febrile with a UA compatible with UTI. Patient's husband also has been having cough for last few days. Patient denies any shortness of breath though patient does have some nonspecific chest pain which increases with cough. Patient did have one episode of nausea and vomiting today early in the morning but presently she has good appetite and wants to eat. Patient will be admitted for further observation and management.  Past Medical History  Diagnosis Date  . Atrial fibrillation   . Diabetes mellitus     type II  . Anemia     Fe deficiency  . GERD (gastroesophageal reflux disease)   . Hyperlipidemia   . Hypertension   . Hypothyroid   . Obesity   . Vaginal dysplasia 1980  . Rosacea   . Chest pain 11/2002    cardiac cath neg   . Back pain   . IBS (irritable bowel syndrome)     with diarrhea  . Asthma     as a child  . Adenomatous colon polyp 12/2007  . Hemorrhoids   . Vertigo   . Hiatal hernia 12/2007    EGD    Past Surgical History  Procedure Date  . Abdominal hysterectomy 1963  . Cholecystectomy 2006  . Knee surgery     Rt TKR 1993 & Lt TKR 2001  . Cervical cone biopsy 1963    D&C  . Appendectomy 1941  . Cystocele repair 1978    /rectocele  . Orif ankle fracture 01/2010    fall/followed by rehab stay  . Cataract extraction 2006    bilateral    Family History  Problem Relation Age of Onset  .  Cancer Mother     leukemia  . Coronary artery disease Mother   . Dementia Mother   . Heart disease Father     CAD  . COPD Sister   . Heart disease Brother   . Diabetes Brother   . Kidney disease Son   . Colon cancer Brother    Social History:  reports that she has quit smoking. She has never used smokeless tobacco. She reports that she does not drink alcohol or use illicit drugs.  Allergies:  Allergies  Allergen Reactions  . Lantus     Itching   . Latex     Rash   . Metformin And Related     Severe diarrhea   . Metoclopramide Hcl     REACTION: unspecified  . Nsaids     REACTION: unspecified  . Promethazine Hcl     REACTION: unspecified    Medications Prior to Admission  Medication Dose Route Frequency Provider Last Rate Last Dose  . acetaminophen (TYLENOL) tablet 650 mg  650 mg Oral Once Hilario Quarry, MD   650 mg at 10/11/11 1701  . albuterol (PROVENTIL) (5 MG/ML) 0.5% nebulizer solution 5 mg  5 mg Nebulization Once Hilario Quarry, MD   5  mg at 10/11/11 1832  . cefTRIAXone (ROCEPHIN) 1 g in dextrose 5 % 50 mL IVPB  1 g Intravenous Once Hilario Quarry, MD   1 g at 10/11/11 1851  . metoprolol tartrate (LOPRESSOR) tablet 25 mg  25 mg Oral Once Eduard Clos, MD      . ondansetron [facility]) 4 MG/2ML injection           . sodium chloride 0.9 % bolus 500 mL  500 mL Intravenous Once Hilario Quarry, MD   500 mL at 10/11/11 1702   Medications Prior to Admission  Medication Sig Dispense Refill  . acetaminophen (TYLENOL) 500 MG tablet Take 500 mg by mouth 2 (two) times daily.       . ergocalciferol (VITAMIN D2) 50000 UNITS capsule Take 50,000 Units by mouth once a week. wednesday      . indapamide (LOZOL) 2.5 MG tablet Take 2.5 mg by mouth daily.      . insulin detemir (LEVEMIR) 100 UNIT/ML injection Inject 60 Units into the skin every morning.      Marland Kitchen levothyroxine (SYNTHROID, LEVOTHROID) 150 MCG tablet Take 150 mcg by mouth daily.      Marland Kitchen loperamide (IMODIUM A-D) 2 MG  tablet Take 2 mg by mouth every morning.      . methocarbamol (ROBAXIN) 500 MG tablet Take 500 mg by mouth 3 (three) times daily as needed. For muscle spasm      . metoprolol (LOPRESSOR) 50 MG tablet Take 0.5 tablets (25 mg total) by mouth 2 (two) times daily.  90 tablet  3  . traMADol (ULTRAM) 50 MG tablet Take 50 mg by mouth 3 (three) times daily as needed. pain      . warfarin (COUMADIN) 5 MG tablet Take 2.5 mg by mouth every evening.        Results for orders placed during the hospital encounter of 10/11/11 (from the past 48 hour(s))  CBC     Status: Normal   Collection Time   10/11/11  4:22 PM      Component Value Range Comment   WBC 6.8  4.0 - 10.5 (K/uL)    RBC 4.31  3.87 - 5.11 (MIL/uL)    Hemoglobin 13.6  12.0 - 15.0 (g/dL)    HCT 16.1  09.6 - 04.5 (%)    MCV 93.5  78.0 - 100.0 (fL)    MCH 31.6  26.0 - 34.0 (pg)    MCHC 33.7  30.0 - 36.0 (g/dL)    RDW 40.9  81.1 - 91.4 (%)    Platelets 185  150 - 400 (K/uL)   DIFFERENTIAL     Status: Abnormal   Collection Time   10/11/11  4:22 PM      Component Value Range Comment   Neutrophils Relative 75  43 - 77 (%)    Neutro Abs 5.1  1.7 - 7.7 (K/uL)    Lymphocytes Relative 8 (*) 12 - 46 (%)    Lymphs Abs 0.5 (*) 0.7 - 4.0 (K/uL)    Monocytes Relative 16 (*) 3 - 12 (%)    Monocytes Absolute 1.1 (*) 0.1 - 1.0 (K/uL)    Eosinophils Relative 0  0 - 5 (%)    Eosinophils Absolute 0.0  0.0 - 0.7 (K/uL)    Basophils Relative 1  0 - 1 (%)    Basophils Absolute 0.0  0.0 - 0.1 (K/uL)   COMPREHENSIVE METABOLIC PANEL     Status: Abnormal   Collection Time  10/11/11  4:22 PM      Component Value Range Comment   Sodium 135  135 - 145 (mEq/L)    Potassium 3.7  3.5 - 5.1 (mEq/L)    Chloride 97  96 - 112 (mEq/L)    CO2 25  19 - 32 (mEq/L)    Glucose, Bld 242 (*) 70 - 99 (mg/dL)    BUN 18  6 - 23 (mg/dL)    Creatinine, Ser 1.61  0.50 - 1.10 (mg/dL)    Calcium 9.1  8.4 - 10.5 (mg/dL)    Total Protein 7.1  6.0 - 8.3 (g/dL)    Albumin 3.5  3.5 -  5.2 (g/dL)    AST 38 (*) 0 - 37 (U/L)    ALT 19  0 - 35 (U/L)    Alkaline Phosphatase 40  39 - 117 (U/L)    Total Bilirubin 0.8  0.3 - 1.2 (mg/dL)    GFR calc non Af Amer 53 (*) >90 (mL/min)    GFR calc Af Amer 61 (*) >90 (mL/min)   TROPONIN I     Status: Normal   Collection Time   10/11/11  4:22 PM      Component Value Range Comment   Troponin I <0.30  <0.30 (ng/mL)   PROTIME-INR     Status: Abnormal   Collection Time   10/11/11  4:22 PM      Component Value Range Comment   Prothrombin Time 18.1 (*) 11.6 - 15.2 (seconds)    INR 1.47  0.00 - 1.49    APTT     Status: Normal   Collection Time   10/11/11  4:22 PM      Component Value Range Comment   aPTT 36  24 - 37 (seconds)   URINALYSIS, ROUTINE W REFLEX MICROSCOPIC     Status: Abnormal   Collection Time   10/11/11  5:00 PM      Component Value Range Comment   Color, Urine YELLOW  YELLOW     APPearance CLOUDY (*) CLEAR     Specific Gravity, Urine 1.020  1.005 - 1.030     pH 5.5  5.0 - 8.0     Glucose, UA 250 (*) NEGATIVE (mg/dL)    Hgb urine dipstick MODERATE (*) NEGATIVE     Bilirubin Urine SMALL (*) NEGATIVE     Ketones, ur 15 (*) NEGATIVE (mg/dL)    Protein, ur 30 (*) NEGATIVE (mg/dL)    Urobilinogen, UA 1.0  0.0 - 1.0 (mg/dL)    Nitrite NEGATIVE  NEGATIVE     Leukocytes, UA MODERATE (*) NEGATIVE    URINE MICROSCOPIC-ADD ON     Status: Abnormal   Collection Time   10/11/11  5:00 PM      Component Value Range Comment   Squamous Epithelial / LPF RARE  RARE     WBC, UA 11-20  <3 (WBC/hpf)    RBC / HPF 0-2  <3 (RBC/hpf)    Bacteria, UA MANY (*) RARE     Urine-Other MANY YEAST     POCT I-STAT 3, BLOOD GAS (G3+)     Status: Abnormal   Collection Time   10/11/11  6:48 PM      Component Value Range Comment   pH, Arterial 7.355  7.350 - 7.400     pCO2 arterial 49.1 (*) 35.0 - 45.0 (mmHg)    pO2, Arterial 82.0  80.0 - 100.0 (mmHg)    Bicarbonate 27.4 (*) 20.0 - 24.0 (mEq/L)  TCO2 29  0 - 100 (mmol/L)    O2 Saturation 95.0       Acid-Base Excess 1.0  0.0 - 2.0 (mmol/L)    Collection site RADIAL, ALLEN'S TEST ACCEPTABLE      Drawn by Operator      Sample type ARTERIAL      Ct Head Wo Contrast  10/11/2011  *RADIOLOGY REPORT*  Clinical Data: Confusion and altered level of consciousness.  CT HEAD WITHOUT CONTRAST  Technique:  Contiguous axial images were obtained from the base of the skull through the vertex without contrast.  Comparison: 12/23/2009  Findings: Stable advanced small vessel ischemic changes in the periventricular white matter.  Stable cortical atrophy. The brain demonstrates no evidence of hemorrhage, infarction, edema, mass effect, extra-axial fluid collection, hydrocephalus or mass lesion. The skull is unremarkable.  IMPRESSION: Stable atrophy and small vessel disease.  Original Report Authenticated By: Reola Calkins, M.D.   Dg Chest Port 1 View  10/11/2011  *RADIOLOGY REPORT*  Clinical Data: Chest pain.  Bilateral breast pains.  History of atrial fibrillation, diabetes, hypertension.  Fever.  PORTABLE CHEST - 1 VIEW  Comparison: 02/26/2010  Findings: The heart is enlarged.  There are perihilar bronchitic changes.  No focal consolidations or pleural effusions are identified.  No edema.  IMPRESSION: Cardiomegaly.  Bronchitic changes.  Original Report Authenticated By: Patterson Hammersmith, M.D.    Review of Systems  HENT: Negative.   Eyes: Negative.   Respiratory: Positive for cough.   Cardiovascular: Positive for chest pain.  Gastrointestinal: Negative.   Genitourinary: Negative.   Musculoskeletal: Negative.   Skin: Negative.   Neurological: Positive for weakness.  Endo/Heme/Allergies: Negative.   Psychiatric/Behavioral: Negative.     Blood pressure 64/38, pulse 113, temperature 100 F (37.8 C), temperature source Rectal, resp. rate 20, last menstrual period 09/01/1961, SpO2 94.00%. Patient blood pressure while I was examining was around 120 systolic.  Physical Exam  Constitutional: She is  oriented to person, place, and time. She appears well-developed and well-nourished. No distress.  HENT:  Head: Normocephalic and atraumatic.  Right Ear: External ear normal.  Left Ear: External ear normal.  Eyes: Conjunctivae are normal. Pupils are equal, round, and reactive to light. Right eye exhibits no discharge. Left eye exhibits no discharge. No scleral icterus.  Neck: Normal range of motion. Neck supple.  Cardiovascular:       Irregularly irregular.  Respiratory: Effort normal and breath sounds normal. No respiratory distress. She has no wheezes. She has no rales.  GI: Soft. Bowel sounds are normal. She exhibits no distension. There is no tenderness. There is no rebound.  Musculoskeletal: Normal range of motion. She exhibits no edema and no tenderness.  Neurological: She is alert and oriented to person, place, and time.       Moves all extremities.  Skin: Skin is warm and dry. No rash noted. She is not diaphoretic. No erythema.  Psychiatric: Her behavior is normal.     Assessment/Plan #1. Generalized weakness - probably deconditioned because of her fever. I think treating her fever most likely from UTI will improve her condition. Get PT consult. #2. Fever most likely from UTI - patient has been started on ceftriaxone we'll follow urine cultures. Check flu PCR. #3. Atrial fibrillation presently mildly tachycardic rate is around 104  - 110 - patient has not taken her meds were called in the morning I am ordering one dose now of metoprolol. In addition fever might have contributed to the tachycardia. Closely observe.  Patient does have some nonspecific chest pain we will cycle cardiac markers. #4. Diabetes mellitus type 2 uncontrolled - patient has not taken insulin in the morning. Presently we'll closely follow CBGs and continue home medications. #5. Left sided breast pain - this has been ongoing for last 2-3 weeks. On exam breast appears benign. Closely observe.  CODE STATUS - full  code.   Eduard Clos. 10/11/2011, 9:10 PM

## 2011-10-11 NOTE — ED Notes (Signed)
Pt reports that she feels weak and has been for several days.  Also she has had some N/V and decreased appetite today.  Pt c/o pain in her left breast with tenderness.

## 2011-10-11 NOTE — ED Notes (Signed)
Weakness in BLE and reported soreness with no decrease in ROM.

## 2011-10-11 NOTE — ED Notes (Signed)
Hospitalist at bedside for evaluation for admission

## 2011-10-11 NOTE — ED Notes (Addendum)
Called floor to give report and spoke with Marchelle Folks, Charity fundraiser.  Pt prepared to go to the floor.

## 2011-10-12 DIAGNOSIS — J101 Influenza due to other identified influenza virus with other respiratory manifestations: Secondary | ICD-10-CM

## 2011-10-12 DIAGNOSIS — A419 Sepsis, unspecified organism: Secondary | ICD-10-CM | POA: Diagnosis present

## 2011-10-12 LAB — COMPREHENSIVE METABOLIC PANEL
AST: 36 U/L (ref 0–37)
CO2: 26 mEq/L (ref 19–32)
Calcium: 8.6 mg/dL (ref 8.4–10.5)
Creatinine, Ser: 1.05 mg/dL (ref 0.50–1.10)
GFR calc non Af Amer: 47 mL/min — ABNORMAL LOW (ref >90)

## 2011-10-12 LAB — CBC
HCT: 39.8 % (ref 36.0–46.0)
Hemoglobin: 13.1 g/dL (ref 12.0–15.0)
MCHC: 32.9 g/dL (ref 30.0–36.0)
RBC: 4.15 MIL/uL (ref 3.87–5.11)
WBC: 6.1 10*3/uL (ref 4.0–10.5)

## 2011-10-12 LAB — GLUCOSE, CAPILLARY
Glucose-Capillary: 277 mg/dL — ABNORMAL HIGH (ref 70–99)
Glucose-Capillary: 232 mg/dL — ABNORMAL HIGH (ref 70–99)
Glucose-Capillary: 229 mg/dL — ABNORMAL HIGH (ref 70–99)

## 2011-10-12 LAB — CARDIAC PANEL(CRET KIN+CKTOT+MB+TROPI)
CK, MB: 2.5 ng/mL (ref 0.3–4.0)
CK, MB: 2.9 ng/mL (ref 0.3–4.0)
Relative Index: 1.6 (ref 0.0–2.5)
Relative Index: 2.4 (ref 0.0–2.5)
Total CK: 122 U/L (ref 7–177)
Total CK: 141 U/L (ref 7–177)
Total CK: 155 U/L (ref 7–177)
Troponin I: <0.3 ng/mL (ref <0.30)
Troponin I: <0.3 ng/mL (ref <0.30)

## 2011-10-12 LAB — INFLUENZA PANEL BY PCR (TYPE A & B): H1N1 flu by pcr: NOT DETECTED

## 2011-10-12 LAB — PROTIME-INR
INR: 1.42 (ref 0.00–1.49)
Prothrombin Time: 17.6 seconds — ABNORMAL HIGH (ref 11.6–15.2)

## 2011-10-12 MED ORDER — WARFARIN SODIUM 5 MG PO TABS
5.0000 mg | ORAL_TABLET | Freq: Once | ORAL | Status: AC
  Administered 2011-10-12: 5 mg via ORAL
  Filled 2011-10-12: qty 1

## 2011-10-12 MED ORDER — GUAIFENESIN ER 600 MG PO TB12
600.0000 mg | ORAL_TABLET | Freq: Two times a day (BID) | ORAL | Status: DC
  Administered 2011-10-12 – 2011-10-20 (×17): 600 mg via ORAL
  Filled 2011-10-12 (×18): qty 1

## 2011-10-12 MED ORDER — HYDROCOD POLST-CHLORPHEN POLST 10-8 MG/5ML PO LQCR
5.0000 mL | Freq: Two times a day (BID) | ORAL | Status: DC
  Administered 2011-10-12 – 2011-10-20 (×16): 5 mL via ORAL
  Filled 2011-10-12 (×17): qty 5

## 2011-10-12 MED ORDER — INSULIN ASPART 100 UNIT/ML ~~LOC~~ SOLN
0.0000 [IU] | Freq: Three times a day (TID) | SUBCUTANEOUS | Status: DC
  Administered 2011-10-12 (×2): 3 [IU] via SUBCUTANEOUS
  Administered 2011-10-13: 1 [IU] via SUBCUTANEOUS
  Administered 2011-10-13 – 2011-10-14 (×2): 2 [IU] via SUBCUTANEOUS
  Administered 2011-10-14: 3 [IU] via SUBCUTANEOUS

## 2011-10-12 MED ORDER — ALBUTEROL SULFATE (5 MG/ML) 0.5% IN NEBU
INHALATION_SOLUTION | RESPIRATORY_TRACT | Status: AC
  Filled 2011-10-12: qty 0.5

## 2011-10-12 MED ORDER — TRAMADOL HCL 50 MG PO TABS
100.0000 mg | ORAL_TABLET | Freq: Three times a day (TID) | ORAL | Status: DC | PRN
  Administered 2011-10-12 – 2011-10-16 (×4): 100 mg via ORAL
  Filled 2011-10-12: qty 2
  Filled 2011-10-12: qty 1
  Filled 2011-10-12 (×2): qty 2

## 2011-10-12 MED ORDER — INSULIN ASPART 100 UNIT/ML ~~LOC~~ SOLN
0.0000 [IU] | Freq: Every day | SUBCUTANEOUS | Status: DC
  Administered 2011-10-13 – 2011-10-14 (×2): 2 [IU] via SUBCUTANEOUS
  Administered 2011-10-17: 3 [IU] via SUBCUTANEOUS
  Administered 2011-10-18: 2 [IU] via SUBCUTANEOUS
  Administered 2011-10-19: 3 [IU] via SUBCUTANEOUS

## 2011-10-12 MED ORDER — OSELTAMIVIR PHOSPHATE 75 MG PO CAPS
75.0000 mg | ORAL_CAPSULE | Freq: Two times a day (BID) | ORAL | Status: AC
  Administered 2011-10-12 – 2011-10-16 (×10): 75 mg via ORAL
  Filled 2011-10-12 (×11): qty 1

## 2011-10-12 NOTE — Evaluation (Signed)
Physical Therapy Evaluation Patient Details Name: [name] MRN: [id number] DOB: 1922-12-31 Today's Date: 10/12/2011  Problem List:  Patient Active Problem List  Diagnoses  . ADENOMATOUS COLONIC POLYP  . HYPOTHYROIDISM  . DIABETES MELLITUS, TYPE II  . HYPERLIPIDEMIA  . HYPERTENSION  . Atrial fibrillation  . CHRONIC RHINITIS  . GERD  . HIATAL HERNIA  . IRRITABLE BOWEL SYNDROME  . ROSACEA  . INTERTRIGO  . OSTEOPOROSIS  . UNSPECIFIED VITAMIN D DEFICIENCY  . Lump in the abdomen  . Fatigue  . B12 deficiency  . Pain of breast  . Dysphagia  . Generalized weakness  . Fever  . UTI (lower urinary tract infection)    Past Medical History:  Past Medical History  Diagnosis Date  . Atrial fibrillation   . Diabetes mellitus     type II  . Anemia     Fe deficiency  . GERD (gastroesophageal reflux disease)   . Hyperlipidemia   . Hypertension   . Hypothyroid   . Obesity   . Vaginal dysplasia 1980  . Rosacea   . Chest pain 11/2002    cardiac cath neg   . Back pain   . IBS (irritable bowel syndrome)     with diarrhea  . Asthma     as a child  . Adenomatous colon polyp 12/2007  . Hemorrhoids   . Vertigo   . Hiatal hernia 12/2007    EGD   Past Surgical History:  Past Surgical History  Procedure Date  . Abdominal hysterectomy 1963  . Cholecystectomy 2006  . Knee surgery     Rt TKR 1993 & Lt TKR 2001  . Cervical cone biopsy 1963    D&C  . Appendectomy 1941  . Cystocele repair 1978    /rectocele  . Orif ankle fracture 01/2010    fall/followed by rehab stay  . Cataract extraction 2006    bilateral    PT Assessment/Plan/Recommendation PT Assessment Clinical Impression Statement: Patient admitted with weakness.  Noted to have flu as test came back as PT finishing up treatment.  Son and pt. desire home with HHPT f/u.  Patient does not have 24 hour care and will need it if goes home as today patient required +2 assist.  Patient functions marginally at baseline.   Will see how the next few days go with treatment.  May need NHP and patient and son aware. PT Recommendation/Assessment: Patient will need skilled PT in the acute care venue PT Problem List: Decreased strength;Decreased activity tolerance;Decreased balance;Decreased mobility;Decreased knowledge of use of DME;Decreased safety awareness;Decreased knowledge of precautions Barriers to Discharge: Decreased caregiver support PT Therapy Diagnosis : Difficulty walking PT Plan PT Frequency: Min 3X/week PT Treatment/Interventions: DME instruction;Gait training;Functional mobility training;Therapeutic activities;Therapeutic exercise;Balance training;Patient/family education PT Recommendation Follow Up Recommendations: Skilled nursing facility;Supervision/Assistance - 24 hour (versus HHPT) Equipment Recommended: Defer to next venue PT Goals  Acute Rehab PT Goals PT Goal Formulation: With patient Time For Goal Achievement: 7 days Pt will go Supine/Side to Sit: with supervision PT Goal: Supine/Side to Sit - Progress: Goal set today Pt will Sit at [facility] of Bed: with supervision;3-5 min;with bilateral upper extremity support PT Goal: Sit at Edge Of Bed - Progress: Goal set today Pt will go Sit to Stand: with upper extremity assist;with min assist;from elevated surface PT Goal: Sit to Stand - Progress: Goal set today Pt will Transfer Bed to Chair/Chair to Bed: with min assist PT Transfer Goal: Bed to Chair/Chair to Bed - Progress: Goal  set today Pt will Ambulate: 16 - 50 feet;with min assist;with least restrictive assistive device PT Goal: Ambulate - Progress: Goal set today Pt will Perform Home Exercise Program: with supervision, verbal cues required/provided PT Goal: Perform Home Exercise Program - Progress: Goal set today  PT Evaluation Precautions/Restrictions  Precautions Precautions: Fall Required Braces or Orthoses: No Restrictions Weight Bearing Restrictions: No Prior Functioning  Home  Living Lives With: Spouse Receives Help From: Family Type of Home: House Home Layout: One level Home Access: Ramped entrance Bathroom Shower/Tub: Engineer, manufacturing systems: Standard Bathroom Accessibility: Yes How Accessible: Accessible via walker Home Adaptive Equipment: Shower chair with back;Walker - rolling;Wheelchair - manual Additional Comments: Son reports that he and patient are hesitant about going to NH as it was not covered on a prior admission.  Son states that patient should recover quickly and be able to go home as her baseline is walking with RW very short distances.   Prior Function Level of Independence: Independent with basic ADLs;Independent with gait;Independent with transfers Driving: No Vocation: Retired Producer, television/film/video: Awake/alert Overall Cognitive Status: Impaired Orientation Level: Oriented X4 Safety/Judgement: Decreased awareness of safety precautions;Decreased safety judgement for tasks assessed Awareness of Errors: Decreased awareness of errors made Decreased Awareness of Errors: Assistance required to correct errors made Sensation/Coordination Sensation Light Touch: Appears Intact Stereognosis: Not tested Hot/Cold: Not tested Proprioception: Not tested Coordination Gross Motor Movements are Fluid and Coordinated: Yes Fine Motor Movements are Fluid and Coordinated: Yes Extremity Assessment RUE Assessment RUE Assessment: Within Functional Limits LUE Assessment LUE Assessment: Within Functional Limits RLE Assessment RLE Assessment: Exceptions to [facility] RLE Strength RLE Overall Strength: Deficits RLE Overall Strength Comments: Hip2+/5, knee 3/5 LLE Assessment LLE Assessment: Exceptions to [facility] LLE Strength LLE Overall Strength: Deficits LLE Overall Strength Comments: Hip 3-/5, knee 3/5 Mobility (including Balance) Bed Mobility Bed Mobility: Yes Supine to Sit: 1: +2 Total assist;Patient percentage (comment) (pt =  40%) Supine to Sit Details (indicate cue type and reason): Patient "fell" onto bed needing a lot of assist at trunk and LEs.   Scooting to [facility]: 1: +2 Total assist;Patient percentage (comment) (pt = 0%) Transfers Transfers: Yes Sit to Stand: 1: +2 Total assist;Patient percentage (comment);With upper extremity assist;From chair/3-in-1;With armrests (pt = 65%) Sit to Stand Details (indicate cue type and reason): Patient had difficulty achieving upright stance and when she did, then was leaning back and had difficulty obtaining her balance.   Stand to Sit: 1: +2 Total assist;Patient percentage (comment);To chair/3-in-1;With upper extremity assist;With armrests Stand to Sit Details: Patient with uncontrolled descent and decr safety as she did not make sure that surface was behind her before she sat.   Stand Pivot Transfers: 1: +2 Total assist;Patient percentage (comment) Stand Pivot Transfer Details (indicate cue type and reason): Patient with flexed posture almost at 80 degree angle holding RW to stand and pivot with RW.  Hips and knees also flexed in which patietn would not stand up even with cues and just barely took pivotal steps around from recliner to bed.   Ambulation/Gait Ambulation/Gait: No Stairs: No Wheelchair Mobility Wheelchair Mobility: No  Posture/Postural Control Posture/Postural Control: Postural limitations Postural Limitations: Significantly flexed posture as well as leans right in sitting and has difficulty achieving midline. Balance Balance Assessed: Yes Static Sitting Balance Static Sitting - Balance Support: Bilateral upper extremity supported;Feet supported Static Sitting - Level of Assistance: 1: +2 Total assist;Patient percentage (comment) (pt = 75%) Static Sitting - Comment/# of Minutes: 3 minutes   End  of Session PT - End of Session Equipment Utilized During Treatment: Gait belt Activity Tolerance: Patient limited by fatigue Patient left: in chair;with call bell  in reach;with family/visitor present Nurse Communication: Mobility status for transfers General Behavior During Session: [facility] for tasks performed Cognition: [facility] for tasks performed  INGOLD,Aunna Snooks 10/12/2011, 1:20 PM  [facility] [phone] (pager)

## 2011-10-12 NOTE — Progress Notes (Signed)
Results for [name], [name] (MRN [id number]) as of 10/12/2011 10:45  Ref. Range 10/11/2011 23:00 10/12/2011 07:52  Glucose-Capillary Latest Range: 70-99 mg/dL 914 (H) 782 (H)   Results for [name], [name] (MRN [id number]) as of 10/12/2011 10:45  Ref. Range 10/04/2011 13:54  Hemoglobin A1C Latest Range: 4.6-6.5 % 12.68 (H)   "78 year-old female with history of diabetes mellitus type 2, atrial fibrillation on Coumadin, hypertension, hypothyroidism was brought to the ER by patient's son as patient was found to be increasingly weak and lethargic since morning."  Noted patient started on Levemir 60 units daily this morning at ~6am.  Also started on Novolog Sensitive SSI today as well.  Patient saw her endocrinologist Dr. Everardo All on 10/03/11.  Dr. Everardo All stated the patient had been off her DM medications (Lantus) for a few months before she was seen by him on 10/03/11.  Patient quit taking Lantus b/c it made her have itching.  Per Dr. Everardo All, patient only willing to take insulin once a day.  Dr. Everardo All started patient on Levemir insulin 60 units daily on 10/04/11.    Patient would likely benefit from OP DM education.    MD- Please place order for "Consult Diabetes OP Education" so pt may attend follow-up session with Certified Diabetes Educator after d/c at the [facility].  Will follow. Ambrose Finland RN, MSN, CDE Diabetes Coordinator Inpatient Diabetes Program [phone]

## 2011-10-12 NOTE — Progress Notes (Signed)
Subjective: She complains that cough is very bad. Per son, she is much more alert today.   Objective: Blood pressure 130/70, pulse 96, temperature 98.9 F (37.2 C), temperature source Oral, resp. rate 22, height 5\' 2"  (1.575 m), weight 111.086 kg (244 lb 14.4 oz), last menstrual period 09/01/1961, SpO2 93.00%. Weight change:   Intake/Output Summary (Last 24 hours) at 10/12/11 2151 Last data filed at 10/12/11 1200  Gross per 24 hour  Intake    483 ml  Output      0 ml  Net    483 ml    Physical Exam: Gen: AAO x 3. Mild distress from coughing.   Cardiovascular:  Irregularly irregular.  Respiratory: Effort normal and breath sounds normal. No respiratory distress. She has no wheezes but has ronchi.   GI: Soft. Bowel sounds are normal. She exhibits no distension. There is no tenderness. There is no rebound.  Musculoskeletal: Normal range of motion. She exhibits no edema and no tenderness.  Neurological: She is alert and oriented to person, place, and time.  Moves all extremities.  Skin: Skin is warm and dry. No rash noted. She is not diaphoretic. No erythema.  Psychiatric: Her behavior is normal.    Lab Results:  [facility] 10/12/11 0928 10/11/11 1622  NA 137 135  K 3.6 3.7  CL 99 97  CO2 26 25  GLUCOSE 278* 242*  BUN 22 18  CREATININE 1.05 0.95  CALCIUM 8.6 9.1  MG -- --  PHOS -- --    Basename 10/12/11 0928 10/11/11 1622  AST 36 38*  ALT 20 19  ALKPHOS 38* 40  BILITOT 0.4 0.8  PROT 6.8 7.1  ALBUMIN 3.2* 3.5   No results found for this basename: LIPASE:2,AMYLASE:2 in the last 72 hours  Basename 10/12/11 0928 10/11/11 1622  WBC 6.1 6.8  NEUTROABS -- 5.1  HGB 13.1 13.6  HCT 39.8 40.3  MCV 95.9 93.5  PLT 198 185    Basename 10/12/11 1555 10/12/11 0928 10/11/11 2357  CKTOTAL 155 141 122  CKMB 2.5 2.8 2.9  CKMBINDEX -- -- --  TROPONINI <0.30 <0.30 <0.30   No components found with this basename: POCBNP:3 No results found for this basename: DDIMER:2 in the  last 72 hours No results found for this basename: HGBA1C:2 in the last 72 hours No results found for this basename: CHOL:2,HDL:2,LDLCALC:2,TRIG:2,CHOLHDL:2,LDLDIRECT:2 in the last 72 hours  Basename 10/11/11 2357  TSH 0.605  T4TOTAL --  T3FREE --  THYROIDAB --   No results found for this basename: VITAMINB12:2,FOLATE:2,FERRITIN:2,TIBC:2,IRON:2,RETICCTPCT:2 in the last 72 hours  Micro Results: No results found for this or any previous visit (from the past 240 hour(s)).  Studies/Results: Ct Head Wo Contrast  10/11/2011  *RADIOLOGY REPORT*  Clinical Data: Confusion and altered level of consciousness.  CT HEAD WITHOUT CONTRAST  Technique:  Contiguous axial images were obtained from the base of the skull through the vertex without contrast.  Comparison: 12/23/2009  Findings: Stable advanced small vessel ischemic changes in the periventricular white matter.  Stable cortical atrophy. The brain demonstrates no evidence of hemorrhage, infarction, edema, mass effect, extra-axial fluid collection, hydrocephalus or mass lesion. The skull is unremarkable.  IMPRESSION: Stable atrophy and small vessel disease.  Original Report Authenticated By: Reola Calkins, M.D.   Dg Chest Port 1 View  10/11/2011  *RADIOLOGY REPORT*  Clinical Data: Chest pain.  Bilateral breast pains.  History of atrial fibrillation, diabetes, hypertension.  Fever.  PORTABLE CHEST - 1 VIEW  Comparison: 02/26/2010  Findings: The heart is enlarged.  There are perihilar bronchitic changes.  No focal consolidations or pleural effusions are identified.  No edema.  IMPRESSION: Cardiomegaly.  Bronchitic changes.  Original Report Authenticated By: Patterson Hammersmith, M.D.    Medications: Scheduled Meds:    . antiseptic oral rinse  15 mL Mouth Rinse BID  . cefTRIAXone (ROCEPHIN)  IV  1 g Intravenous Q24H  . chlorpheniramine-HYDROcodone  5 mL Oral Q12H  . guaiFENesin  600 mg Oral BID  . insulin aspart  0-5 Units Subcutaneous QHS  .  insulin aspart  0-9 Units Subcutaneous TID WC  . insulin detemir  60 Units Subcutaneous BH-q7a  . levothyroxine  150 mcg Oral Daily  . metoprolol  25 mg Oral BID  . oseltamivir  75 mg Oral BID  . pantoprazole  40 mg Oral Q1200  . sodium chloride  3 mL Intravenous Q12H  . warfarin  5 mg Oral ONCE-1800  . Warfarin - Pharmacist Dosing Inpatient   Does not apply q1800  . DISCONTD: levothyroxine  150 mcg Oral Daily  . DISCONTD: ondansetron      . DISCONTD: sodium chloride  3 mL Intravenous Q12H   Continuous Infusions:  PRN Meds:.acetaminophen, acetaminophen, meclizine, methocarbamol, ondansetron (ZOFRAN) IV, ondansetron, traMADol, DISCONTD: traMADol  Assessment/Plan: Sepsis due to Influenza B and UTI  Add tamiflu, tussionex and mucinex. Cont antibiotics.   Generalized weakness PT   Atrial fibrillation - rate controlled, cont coumadin.   Pain in left breast- no masses noted. Further w/u by PCP.    HYPOTHYROIDISM  DIABETES MELLITUS, TYPE II  HYPERTENSION   LOS: 1 day   [facility] [phone] 10/12/2011, 9:51 PM

## 2011-10-12 NOTE — Progress Notes (Signed)
ANTICOAGULATION CONSULT NOTE - FOLLOW UP  Pharmacy Consult for Warfarin Indication: atrial fibrillation  Allergies  Allergen Reactions  . Lantus     Itching   . Latex     Rash   . Metformin And Related     Severe diarrhea   . Metoclopramide Hcl     REACTION: unspecified  . Nsaids     REACTION: unspecified  . Promethazine Hcl     REACTION: unspecified    Vital Signs: Temp: 99.6 F (37.6 C) (03/13 0500) Temp src: Oral (03/13 0500) BP: 124/83 mmHg (03/13 1120) Pulse Rate: 83  (03/13 1120)  Labs:  Basename 10/12/11 0928 10/11/11 2357 10/11/11 1622  HGB 13.1 -- 13.6  HCT 39.8 -- 40.3  PLT 198 -- 185  APTT -- -- 36  LABPROT 17.6* -- 18.1*  INR 1.42 -- 1.47  HEPARINUNFRC -- -- --  CREATININE 1.05 -- 0.95  CKTOTAL 141 122 --  CKMB 2.8 2.9 --  TROPONINI <0.30 <0.30 <0.30   The CrCl is unknown because both a height and weight (above a minimum accepted value) are required for this calculation.  Medical History: Past Medical History  Diagnosis Date  . Atrial fibrillation   . Diabetes mellitus     type II  . Anemia     Fe deficiency  . GERD (gastroesophageal reflux disease)   . Hyperlipidemia   . Hypertension   . Hypothyroid   . Obesity   . Vaginal dysplasia 1980  . Rosacea   . Chest pain 11/2002    cardiac cath neg   . Back pain   . IBS (irritable bowel syndrome)     with diarrhea  . Asthma     as a child  . Adenomatous colon polyp 12/2007  . Hemorrhoids   . Vertigo   . Hiatal hernia 12/2007    EGD    Medications:   Prescriptions prior to admission  Medication Sig Dispense Refill  . acetaminophen (TYLENOL) 500 MG tablet Take 500 mg by mouth 2 (two) times daily.       . ergocalciferol (VITAMIN D2) 50000 UNITS capsule Take 50,000 Units by mouth once a week. wednesday      . indapamide (LOZOL) 2.5 MG tablet Take 2.5 mg by mouth daily.      . insulin detemir (LEVEMIR) 100 UNIT/ML injection Inject 60 Units into the skin every morning.      Marland Kitchen  levothyroxine (SYNTHROID, LEVOTHROID) 150 MCG tablet Take 150 mcg by mouth daily.      Marland Kitchen loperamide (IMODIUM A-D) 2 MG tablet Take 2 mg by mouth every morning.      . meclizine (ANTIVERT) 25 MG tablet Take 25 mg by mouth 3 (three) times daily as needed. For vertigo      . methocarbamol (ROBAXIN) 500 MG tablet Take 500 mg by mouth 3 (three) times daily as needed. For muscle spasm      . metoprolol (LOPRESSOR) 50 MG tablet Take 0.5 tablets (25 mg total) by mouth 2 (two) times daily.  90 tablet  3  . omeprazole (PRILOSEC) 20 MG capsule Take 20 mg by mouth daily.      . potassium chloride SA (K-DUR,KLOR-CON) 20 MEQ tablet Take 20 mEq by mouth 2 (two) times daily.      . traMADol (ULTRAM) 50 MG tablet Take 50 mg by mouth 3 (three) times daily as needed. pain      . warfarin (COUMADIN) 5 MG tablet Take 2.5 mg by mouth every  evening.         Admit Complaint: 57 YOF with Afib admitted 10/11/11 for increasing weakness and leathargy.  Pharmacy consulted to dose warfarin.  INR remains subtherapeutic today; no bleeding reported.  Noted Tamiflu started for positive influenza B.  Home warfarin dose 2.5 mg daily, admission INR subtherapeutic.  Goal of Therapy:  INR 2-3    Plan:  - Repeat Coumadin 5 mg PO today - Daily PT/INR - Monitor CBGs   Phillips Climes, PharmD 10/12/2011,1:43 PM

## 2011-10-13 LAB — PROTIME-INR
INR: 1.53 — ABNORMAL HIGH (ref 0.00–1.49)
Prothrombin Time: 18.7 seconds — ABNORMAL HIGH (ref 11.6–15.2)

## 2011-10-13 LAB — GLUCOSE, CAPILLARY

## 2011-10-13 MED ORDER — IPRATROPIUM BROMIDE 0.02 % IN SOLN
0.5000 mg | Freq: Four times a day (QID) | RESPIRATORY_TRACT | Status: DC
  Administered 2011-10-13 – 2011-10-15 (×5): 0.5 mg via RESPIRATORY_TRACT
  Filled 2011-10-13 (×6): qty 2.5

## 2011-10-13 MED ORDER — DOCUSATE SODIUM 100 MG PO CAPS
100.0000 mg | ORAL_CAPSULE | Freq: Two times a day (BID) | ORAL | Status: DC
  Administered 2011-10-13 – 2011-10-16 (×5): 100 mg via ORAL
  Filled 2011-10-13 (×10): qty 1

## 2011-10-13 MED ORDER — IPRATROPIUM BROMIDE 0.02 % IN SOLN: 0.5000 mg | Freq: Four times a day (QID) | RESPIRATORY_TRACT | Status: DC

## 2011-10-13 MED ORDER — BISACODYL 10 MG RE SUPP: 10.0000 mg | Freq: Every day | RECTAL | Status: DC | PRN

## 2011-10-13 MED ORDER — POLYETHYLENE GLYCOL 3350 17 G PO PACK
17.0000 g | PACK | Freq: Once | ORAL | Status: AC
  Administered 2011-10-13: 17 g via ORAL
  Filled 2011-10-13: qty 1

## 2011-10-13 MED ORDER — LEVALBUTEROL HCL 0.63 MG/3ML IN NEBU: 0.6300 mg | INHALATION_SOLUTION | Freq: Four times a day (QID) | RESPIRATORY_TRACT | Status: DC

## 2011-10-13 MED ORDER — WARFARIN SODIUM 5 MG PO TABS
5.0000 mg | ORAL_TABLET | Freq: Once | ORAL | Status: AC
  Administered 2011-10-13: 5 mg via ORAL
  Filled 2011-10-13: qty 1

## 2011-10-13 MED ORDER — LEVALBUTEROL HCL 0.63 MG/3ML IN NEBU
0.6300 mg | INHALATION_SOLUTION | Freq: Four times a day (QID) | RESPIRATORY_TRACT | Status: DC
  Administered 2011-10-13 – 2011-10-15 (×5): 0.63 mg via RESPIRATORY_TRACT
  Filled 2011-10-13 (×13): qty 3

## 2011-10-13 NOTE — Progress Notes (Signed)
ANTICOAGULATION CONSULT NOTE - FOLLOW UP  Pharmacy Consult for Warfarin Indication: atrial fibrillation  Allergies  Allergen Reactions  . Lantus     Itching   . Latex     Rash   . Metformin And Related     Severe diarrhea   . Metoclopramide Hcl     REACTION: unspecified  . Nsaids     REACTION: unspecified  . Promethazine Hcl     REACTION: unspecified    Vital Signs: Temp: 98.5 F (36.9 C) (03/14 0500) Temp src: Oral (03/14 0500) BP: 148/80 mmHg (03/14 0500) Pulse Rate: 110  (03/14 0500)  Labs:  Basename 10/13/11 0515 10/12/11 1555 10/12/11 0928 10/11/11 2357 10/11/11 1622  HGB -- -- 13.1 -- 13.6  HCT -- -- 39.8 -- 40.3  PLT -- -- 198 -- 185  APTT -- -- -- -- 36  LABPROT 18.7* -- 17.6* -- 18.1*  INR 1.53* -- 1.42 -- 1.47  HEPARINUNFRC -- -- -- -- --  CREATININE -- -- 1.05 -- 0.95  CKTOTAL -- 155 141 122 --  CKMB -- 2.5 2.8 2.9 --  TROPONINI -- <0.30 <0.30 <0.30 --   Estimated Creatinine Clearance: 45.2 ml/min (by C-G formula based on Cr of 1.05).  Medical History: Past Medical History  Diagnosis Date  . Atrial fibrillation   . Diabetes mellitus     type II  . Anemia     Fe deficiency  . GERD (gastroesophageal reflux disease)   . Hyperlipidemia   . Hypertension   . Hypothyroid   . Obesity   . Vaginal dysplasia 1980  . Rosacea   . Chest pain 11/2002    cardiac cath neg   . Back pain   . IBS (irritable bowel syndrome)     with diarrhea  . Asthma     as a child  . Adenomatous colon polyp 12/2007  . Hemorrhoids   . Vertigo   . Hiatal hernia 12/2007    EGD    Medications:   Prescriptions prior to admission  Medication Sig Dispense Refill  . acetaminophen (TYLENOL) 500 MG tablet Take 500 mg by mouth 2 (two) times daily.       . ergocalciferol (VITAMIN D2) 50000 UNITS capsule Take 50,000 Units by mouth once a week. wednesday      . indapamide (LOZOL) 2.5 MG tablet Take 2.5 mg by mouth daily.      . insulin detemir (LEVEMIR) 100 UNIT/ML  injection Inject 60 Units into the skin every morning.      Marland Kitchen levothyroxine (SYNTHROID, LEVOTHROID) 150 MCG tablet Take 150 mcg by mouth daily.      Marland Kitchen loperamide (IMODIUM A-D) 2 MG tablet Take 2 mg by mouth every morning.      . meclizine (ANTIVERT) 25 MG tablet Take 25 mg by mouth 3 (three) times daily as needed. For vertigo      . methocarbamol (ROBAXIN) 500 MG tablet Take 500 mg by mouth 3 (three) times daily as needed. For muscle spasm      . metoprolol (LOPRESSOR) 50 MG tablet Take 0.5 tablets (25 mg total) by mouth 2 (two) times daily.  90 tablet  3  . omeprazole (PRILOSEC) 20 MG capsule Take 20 mg by mouth daily.      . potassium chloride SA (K-DUR,KLOR-CON) 20 MEQ tablet Take 20 mEq by mouth 2 (two) times daily.      . traMADol (ULTRAM) 50 MG tablet Take 50 mg by mouth 3 (three) times daily as  needed. pain      . warfarin (COUMADIN) 5 MG tablet Take 2.5 mg by mouth every evening.         Assessment: [name] with Afib admitted 10/11/11 for increasing weakness and leathargy.  Pharmacy consulted to dose warfarin.  INR increased minimally despite doubling of home dose.  No bleeding documented.  Home warfarin dose 2.5 mg daily, admission INR subtherapeutic.  Goal of Therapy:  INR 2-3    Plan:  - Repeat Coumadin 5 mg PO today - Daily PT/INR; if INR increases minimally tomorrow, will plan to increase dose further. - Monitor CBGs   Phillips Climes, PharmD 10/13/2011,9:27 AM

## 2011-10-13 NOTE — Progress Notes (Signed)
Patient ID: [name]  female  ZOX:[id number]    DOB: [birth year]    DOA: 10/11/2011  PCP: [name], MD, MD  Subjective: Wheezing bilaterally this morning. Son at the bedside, feels she is much more alert. Patient eating lunch by herself.  Objective: Weight change:   Intake/Output Summary (Last 24 hours) at 10/13/11 1700 Last data filed at 10/13/11 0845  Gross per 24 hour  Intake    243 ml  Output      0 ml  Net    243 ml   Blood pressure 95/61, pulse 87, temperature 97.8 F (36.6 C), temperature source Oral, resp. rate 20, height 5\' 2"  (1.575 m), weight 110.95 kg (244 lb 9.6 oz), last menstrual period 09/01/1961, SpO2 96.00%.  Physical Exam: General: Alert and awake,  not in any acute distress. HEENT: anicteric sclera, pupils reactive to light and accommodation, EOMI CVS: S1-S2 clear, no murmur rubs or gallops Chest: Bilateral wheezings Abdomen: soft nontender, nondistended, normal bowel sounds, no organomegaly Extremities: no cyanosis, clubbing or edema noted bilaterally Neuro: Cranial nerves II-XII intact, no focal neurological deficits  Lab Results: Basic Metabolic Panel:  Lab 10/12/11 8119 10/11/11 1622  NA 137 135  K 3.6 3.7  CL 99 97  CO2 26 25  GLUCOSE 278* 242*  BUN 22 18  CREATININE 1.05 0.95  CALCIUM 8.6 9.1  MG -- --  PHOS -- --   Liver Function Tests:  Lab 10/12/11 0928 10/11/11 1622  AST 36 38*  ALT 20 19  ALKPHOS 38* 40  BILITOT 0.4 0.8  PROT 6.8 7.1  ALBUMIN 3.2* 3.5  CBC:  Lab 10/12/11 0928 10/11/11 1622  WBC 6.1 6.8  NEUTROABS -- 5.1  HGB 13.1 13.6  HCT 39.8 40.3  MCV 95.9 93.5  PLT 198 185   Cardiac Enzymes:  Lab 10/12/11 1555 10/12/11 0928 10/11/11 2357  CKTOTAL 155 141 122  CKMB 2.5 2.8 2.9  CKMBINDEX -- -- --  TROPONINI <0.30 <0.30 <0.30   BNP: No components found with this basename: POCBNP:2 CBG:  Lab 10/13/11 1641 10/13/11 1156 10/13/11 0741 10/12/11 2120 10/12/11 1616  GLUCAP 96 137* 173* 167* 229*      Micro Results: Recent Results (from the past 240 hour(s))  CULTURE, BLOOD (ROUTINE X 2)     Status: Normal (Preliminary result)   Collection Time   10/11/11  4:20 PM      Component Value Range Status Comment   Specimen Description BLOOD ARM LEFT   Final    Special Requests     Final    Value: BOTTLES DRAWN AEROBIC AND ANAEROBIC 10CC AER 5CC ANA   Culture  Setup Time [id number]   Final    Culture     Final    Value:        BLOOD CULTURE RECEIVED NO GROWTH TO DATE CULTURE WILL BE HELD FOR 5 DAYS BEFORE ISSUING A FINAL NEGATIVE REPORT   Report Status PENDING   Incomplete   CULTURE, BLOOD (ROUTINE X 2)     Status: Normal (Preliminary result)   Collection Time   10/11/11  4:55 PM      Component Value Range Status Comment   Specimen Description BLOOD LEFT ARM   Final    Special Requests     Final    Value: BOTTLES DRAWN AEROBIC AND ANAEROBIC BLUE 10CC RED 5CC   Culture  Setup Time [id number]   Final    Culture     Final  Value:        BLOOD CULTURE RECEIVED NO GROWTH TO DATE CULTURE WILL BE HELD FOR 5 DAYS BEFORE ISSUING A FINAL NEGATIVE REPORT   Report Status PENDING   Incomplete     Studies/Results: Ct Head Wo Contrast  10/11/2011  *RADIOLOGY REPORT*  Clinical Data: Confusion and altered level of consciousness.  CT HEAD WITHOUT CONTRAST  Technique:  Contiguous axial images were obtained from the base of the skull through the vertex without contrast.  Comparison: 12/23/2009  Findings: Stable advanced small vessel ischemic changes in the periventricular white matter.  Stable cortical atrophy. The brain demonstrates no evidence of hemorrhage, infarction, edema, mass effect, extra-axial fluid collection, hydrocephalus or mass lesion. The skull is unremarkable.  IMPRESSION: Stable atrophy and small vessel disease.  Original Report Authenticated By: Reola Calkins, M.D.   Dg Chest Port 1 View  10/11/2011  *RADIOLOGY REPORT*  Clinical Data: Chest pain.  Bilateral breast pains.   History of atrial fibrillation, diabetes, hypertension.  Fever.  PORTABLE CHEST - 1 VIEW  Comparison: 02/26/2010  Findings: The heart is enlarged.  There are perihilar bronchitic changes.  No focal consolidations or pleural effusions are identified.  No edema.  IMPRESSION: Cardiomegaly.  Bronchitic changes.  Original Report Authenticated By: Patterson Hammersmith, M.D.    Medications: Scheduled Meds:   . antiseptic oral rinse  15 mL Mouth Rinse BID  . cefTRIAXone (ROCEPHIN)  IV  1 g Intravenous Q24H  . chlorpheniramine-HYDROcodone  5 mL Oral Q12H  . docusate sodium  100 mg Oral BID  . guaiFENesin  600 mg Oral BID  . insulin aspart  0-5 Units Subcutaneous QHS  . insulin aspart  0-9 Units Subcutaneous TID WC  . insulin detemir  60 Units Subcutaneous BH-q7a  . ipratropium  0.5 mg Nebulization Q6H  . levalbuterol  0.63 mg Nebulization Q6H  . levothyroxine  150 mcg Oral Daily  . metoprolol  25 mg Oral BID  . oseltamivir  75 mg Oral BID  . pantoprazole  40 mg Oral Q1200  . polyethylene glycol  17 g Oral Once  . sodium chloride  3 mL Intravenous Q12H  . warfarin  5 mg Oral ONCE-1800  . warfarin  5 mg Oral ONCE-1800  . Warfarin - Pharmacist Dosing Inpatient   Does not apply q1800  . DISCONTD: ipratropium  0.5 mg Nebulization Q6H  . DISCONTD: levalbuterol  0.63 mg Nebulization Q6H   Continuous Infusions:    Assessment/Plan: Principal Problem:  *Sepsis secondary to influenza B and UTI: - Continue Tamiflu (day #2), and Rocephin, follow urine cultures  Active Problems:  HYPOTHYROIDISM: Continue levothyroxin   DIABETES MELLITUS, TYPE II: Fairly well-controlled   HYPERTENSION: Stable   Atrial fibrillation: Currently rate controlled, on Coumadin    Generalized weakness: Patient currently lives at home with her husband, ordered PT evaluation regarding recommendations for disposition   DVT Prophylaxis: On Coumadin  Code Status: Full code  Disposition: Not medically ready   LOS: 2  days   Jazzlin Clements M.D. [facility] 10/13/2011, 5:00 PM Pager: [phone]

## 2011-10-14 LAB — PROTIME-INR
INR: 1.84 — ABNORMAL HIGH (ref 0.00–1.49)
Prothrombin Time: 21.6 seconds — ABNORMAL HIGH (ref 11.6–15.2)

## 2011-10-14 LAB — GLUCOSE, CAPILLARY
Glucose-Capillary: 162 mg/dL — ABNORMAL HIGH (ref 70–99)
Glucose-Capillary: 241 mg/dL — ABNORMAL HIGH (ref 70–99)

## 2011-10-14 MED ORDER — FLUTICASONE PROPIONATE HFA 110 MCG/ACT IN AERO
2.0000 | INHALATION_SPRAY | Freq: Two times a day (BID) | RESPIRATORY_TRACT | Status: DC
  Administered 2011-10-14 – 2011-10-19 (×11): 2 via RESPIRATORY_TRACT

## 2011-10-14 MED ORDER — WARFARIN SODIUM 2.5 MG PO TABS
2.5000 mg | ORAL_TABLET | Freq: Once | ORAL | Status: AC
  Administered 2011-10-14: 2.5 mg via ORAL
  Filled 2011-10-14: qty 1

## 2011-10-14 MED ORDER — FLUTICASONE PROPIONATE HFA 110 MCG/ACT IN AERO
2.0000 | INHALATION_SPRAY | Freq: Two times a day (BID) | RESPIRATORY_TRACT | Status: DC
  Filled 2011-10-14: qty 12

## 2011-10-14 MED ORDER — INSULIN ASPART 100 UNIT/ML ~~LOC~~ SOLN
0.0000 [IU] | Freq: Three times a day (TID) | SUBCUTANEOUS | Status: DC
  Administered 2011-10-14: 5 [IU] via SUBCUTANEOUS
  Administered 2011-10-15: 3 [IU] via SUBCUTANEOUS
  Administered 2011-10-15 (×2): 5 [IU] via SUBCUTANEOUS
  Administered 2011-10-16: 2 [IU] via SUBCUTANEOUS
  Administered 2011-10-16: 5 [IU] via SUBCUTANEOUS
  Administered 2011-10-16: 2 [IU] via SUBCUTANEOUS
  Administered 2011-10-17: 5 [IU] via SUBCUTANEOUS
  Administered 2011-10-17 (×2): 2 [IU] via SUBCUTANEOUS
  Administered 2011-10-18 (×2): 8 [IU] via SUBCUTANEOUS
  Administered 2011-10-18: 3 [IU] via SUBCUTANEOUS
  Administered 2011-10-19: 5 [IU] via SUBCUTANEOUS
  Administered 2011-10-19: 3 [IU] via SUBCUTANEOUS
  Administered 2011-10-19: 2 [IU] via SUBCUTANEOUS
  Administered 2011-10-20: 3 [IU] via SUBCUTANEOUS

## 2011-10-14 NOTE — Progress Notes (Signed)
Patient ID: [name]  female  ZOX:[id number]    DOB: [birth year]    DOA: 10/11/2011  PCP: [name], MD, MD  Subjective: Patient much more alert and appropriately conversant today, sitting in the chair, wheezing improving   Objective: Weight change: -2.676 kg (-5 lb 14.4 oz)  Intake/Output Summary (Last 24 hours) at 10/14/11 1434 Last data filed at 10/14/11 1108  Gross per 24 hour  Intake    577 ml  Output      9 ml  Net    568 ml   Blood pressure 129/73, pulse 90, temperature 98.6 F (37 C), temperature source Oral, resp. rate 20, height 5\' 2"  (1.575 m), weight 108.41 kg (239 lb), last menstrual period 09/01/1961, SpO2 91.00%.  Physical Exam: General: Alert and awake,  not in any acute distress. HEENT: anicteric sclera, pupils reactive to light and accommodation, EOMI CVS: S1-S2 clear, no murmur rubs or gallops Chest: Bilateral wheezing, improving from yesterday Abdomen: soft nontender, nondistended, normal bowel sounds, no organomegaly Extremities: no cyanosis, clubbing or edema noted bilaterally Neuro: Cranial nerves II-XII intact, no focal neurological deficits  Lab Results: Basic Metabolic Panel:  Lab 10/12/11 8119 10/11/11 1622  NA 137 135  K 3.6 3.7  CL 99 97  CO2 26 25  GLUCOSE 278* 242*  BUN 22 18  CREATININE 1.05 0.95  CALCIUM 8.6 9.1  MG -- --  PHOS -- --   Liver Function Tests:  Lab 10/12/11 0928 10/11/11 1622  AST 36 38*  ALT 20 19  ALKPHOS 38* 40  BILITOT 0.4 0.8  PROT 6.8 7.1  ALBUMIN 3.2* 3.5  CBC:  Lab 10/12/11 0928 10/11/11 1622  WBC 6.1 6.8  NEUTROABS -- 5.1  HGB 13.1 13.6  HCT 39.8 40.3  MCV 95.9 93.5  PLT 198 185   Cardiac Enzymes:  Lab 10/12/11 1555 10/12/11 0928 10/11/11 2357  CKTOTAL 155 141 122  CKMB 2.5 2.8 2.9  CKMBINDEX -- -- --  TROPONINI <0.30 <0.30 <0.30   BNP: No components found with this basename: POCBNP:2 CBG:  Lab 10/14/11 1135 10/14/11 0749 10/13/11 2033 10/13/11 1641 10/13/11 1156  GLUCAP 241* 162*  89 96 137*     Micro Results: Recent Results (from the past 240 hour(s))  CULTURE, BLOOD (ROUTINE X 2)     Status: Normal (Preliminary result)   Collection Time   10/11/11  4:20 PM      Component Value Range Status Comment   Specimen Description BLOOD ARM LEFT   Final    Special Requests     Final    Value: BOTTLES DRAWN AEROBIC AND ANAEROBIC 10CC AER 5CC ANA   Culture  Setup Time [id number]   Final    Culture     Final    Value:        BLOOD CULTURE RECEIVED NO GROWTH TO DATE CULTURE WILL BE HELD FOR 5 DAYS BEFORE ISSUING A FINAL NEGATIVE REPORT   Report Status PENDING   Incomplete   CULTURE, BLOOD (ROUTINE X 2)     Status: Normal (Preliminary result)   Collection Time   10/11/11  4:55 PM      Component Value Range Status Comment   Specimen Description BLOOD LEFT ARM   Final    Special Requests     Final    Value: BOTTLES DRAWN AEROBIC AND ANAEROBIC BLUE 10CC RED 5CC   Culture  Setup Time [id number]   Final    Culture     Final  Value:        BLOOD CULTURE RECEIVED NO GROWTH TO DATE CULTURE WILL BE HELD FOR 5 DAYS BEFORE ISSUING A FINAL NEGATIVE REPORT   Report Status PENDING   Incomplete     Studies/Results: Ct Head Wo Contrast  10/11/2011  *RADIOLOGY REPORT*  Clinical Data: Confusion and altered level of consciousness.  CT HEAD WITHOUT CONTRAST  Technique:  Contiguous axial images were obtained from the base of the skull through the vertex without contrast.  Comparison: 12/23/2009  Findings: Stable advanced small vessel ischemic changes in the periventricular white matter.  Stable cortical atrophy. The brain demonstrates no evidence of hemorrhage, infarction, edema, mass effect, extra-axial fluid collection, hydrocephalus or mass lesion. The skull is unremarkable.  IMPRESSION: Stable atrophy and small vessel disease.  Original Report Authenticated By: Reola Calkins, M.D.   Dg Chest Port 1 View  10/11/2011  *RADIOLOGY REPORT*  Clinical Data: Chest pain.  Bilateral breast  pains.  History of atrial fibrillation, diabetes, hypertension.  Fever.  PORTABLE CHEST - 1 VIEW  Comparison: 02/26/2010  Findings: The heart is enlarged.  There are perihilar bronchitic changes.  No focal consolidations or pleural effusions are identified.  No edema.  IMPRESSION: Cardiomegaly.  Bronchitic changes.  Original Report Authenticated By: Patterson Hammersmith, M.D.    Medications: Scheduled Meds:    . antiseptic oral rinse  15 mL Mouth Rinse BID  . cefTRIAXone (ROCEPHIN)  IV  1 g Intravenous Q24H  . chlorpheniramine-HYDROcodone  5 mL Oral Q12H  . docusate sodium  100 mg Oral BID  . fluticasone  2 puff Inhalation BID  . guaiFENesin  600 mg Oral BID  . insulin aspart  0-5 Units Subcutaneous QHS  . insulin aspart  0-9 Units Subcutaneous TID WC  . insulin detemir  60 Units Subcutaneous BH-q7a  . ipratropium  0.5 mg Nebulization Q6H  . levalbuterol  0.63 mg Nebulization Q6H  . levothyroxine  150 mcg Oral Daily  . metoprolol  25 mg Oral BID  . oseltamivir  75 mg Oral BID  . pantoprazole  40 mg Oral Q1200  . sodium chloride  3 mL Intravenous Q12H  . warfarin  2.5 mg Oral ONCE-1800  . warfarin  5 mg Oral ONCE-1800  . Warfarin - Pharmacist Dosing Inpatient   Does not apply q1800   Continuous Infusions:    Assessment/Plan: Principal Problem:  *Sepsis secondary to influenza B and UTI: - Continue Tamiflu (day #3), and Rocephin, follow urine cultures  Acute bronchitis/COPD: Still somewhat wheezing - Continue scheduled nebs, added Flovent  Active Problems:  HYPOTHYROIDISM: Continue levothyroxin   DIABETES MELLITUS, TYPE II: Blood sugar in 200s today  - Increase sliding scale to moderate, continue Levemir and qhs coverage    HYPERTENSION: Stable   Atrial fibrillation: Currently rate controlled, on Coumadin    Generalized weakness: PT recommending skilled nursing facility, SW aware   DVT Prophylaxis: On Coumadin  Code Status: Full code  Disposition: Not medically ready,  likely on Monday   LOS: 3 days   Renaye Janicki M.D. [facility] 10/14/2011, 2:34 PM Pager: [phone]

## 2011-10-14 NOTE — Progress Notes (Signed)
Physical Therapy Treatment Patient Details Name: [name] MRN: [id number] DOB: 24-Sep-1922 Today's Date: 10/14/2011  PT Assessment/Plan  PT - Assessment/Plan Comments on Treatment Session: Improved from eval, still fatigues easily, but ambulated 25 feet with min A only. PT Plan: Discharge plan remains appropriate Follow Up Recommendations: Skilled nursing facility Equipment Recommended: Defer to next venue PT Goals  Acute Rehab PT Goals PT Goal: Supine/Side to Sit - Progress: Progressing toward goal PT Goal: Sit at Edge Of Bed - Progress: Met PT Goal: Sit to Stand - Progress: Progressing toward goal PT Transfer Goal: Bed to Chair/Chair to Bed - Progress: Progressing toward goal PT Goal: Ambulate - Progress: Met PT Goal: Perform Home Exercise Program - Progress: Progressing toward goal  PT Treatment Precautions/Restrictions  Precautions Precautions: Fall Required Braces or Orthoses: No Restrictions Weight Bearing Restrictions: No Mobility (including Balance) Bed Mobility Supine to Sit: 4: Min assist Supine to Sit Details (indicate cue type and reason): minimal truncal assist to initiate coming off the bed Transfers Transfers: Yes Sit to Stand: 1: +2 Total assist;Patient percentage (comment);With upper extremity assist;With armrests;From bed (pt =75%) Sit to Stand Details (indicate cue type and reason): mild assist to come forward over BOS Stand to Sit: 4: Min assist Ambulation/Gait Ambulation/Gait: Yes Ambulation/Gait Assistance: 4: Min assist Ambulation/Gait Assistance Details (indicate cue type and reason): mildly unsteady throughout Ambulation Distance (Feet): 25 Feet Assistive device: Rolling walker;Other (Comment) (chair following behind due to pt weakness) Gait Pattern: Step-through pattern;Decreased step length - right;Decreased step length - left;Decreased stride length;Shuffle;Scissoring Stairs: No  Posture/Postural Control Posture/Postural Control: Postural  limitations Postural Limitations: Significantly flexed posture  flexed posture Balance Balance Assessed: No Static Sitting Balance Static Sitting - Balance Support: No upper extremity supported;Feet supported Static Sitting - Level of Assistance: 5: Stand by assistance (brushing her hair, reaching for th comb) Static Sitting - Comment/# of Minutes: 10 Exercise  General Exercises - Lower Extremity Heel Slides: AAROM;AROM;Both;10 reps;Supine Hip ABduction/ADduction: AAROM;AROM;Both;10 reps;Supine End of Session PT - End of Session Activity Tolerance: Patient tolerated treatment well Patient left: in chair;with call bell in reach Nurse Communication: Mobility status for transfers;Mobility status for ambulation General Behavior During Session: [facility] for tasks performed Cognition: [facility] for tasks performed  Maudry Zeidan, Eliseo Gum 10/14/2011, 2:25 PM  10/14/2011  [place], PT [phone] (pager)

## 2011-10-14 NOTE — Progress Notes (Signed)
ANTICOAGULATION CONSULT NOTE - FOLLOW UP  Pharmacy Consult for Warfarin Indication: atrial fibrillation  Allergies  Allergen Reactions  . Lantus     Itching   . Latex     Rash   . Metformin And Related     Severe diarrhea   . Metoclopramide Hcl     REACTION: unspecified  . Nsaids     REACTION: unspecified  . Promethazine Hcl     REACTION: unspecified    Vital Signs: Temp: 98.6 F (37 C) (03/15 0500) Temp src: Oral (03/15 0500) BP: 129/73 mmHg (03/15 1107) Pulse Rate: 90  (03/15 1107)  Labs:  Alvira Philips 10/14/11 0858 10/13/11 0515 10/12/11 1555 10/12/11 0928 10/11/11 2357 10/11/11 1622  HGB -- -- -- 13.1 -- 13.6  HCT -- -- -- 39.8 -- 40.3  PLT -- -- -- 198 -- 185  APTT -- -- -- -- -- 36  LABPROT 21.6* 18.7* -- 17.6* -- --  INR 1.84* 1.53* -- 1.42 -- --  HEPARINUNFRC -- -- -- -- -- --  CREATININE -- -- -- 1.05 -- 0.95  CKTOTAL -- -- 155 141 122 --  CKMB -- -- 2.5 2.8 2.9 --  TROPONINI -- -- <0.30 <0.30 <0.30 --   Estimated Creatinine Clearance: 44.6 ml/min (by C-G formula based on Cr of 1.05).  Medical History: Past Medical History  Diagnosis Date  . Atrial fibrillation   . Diabetes mellitus     type II  . Anemia     Fe deficiency  . GERD (gastroesophageal reflux disease)   . Hyperlipidemia   . Hypertension   . Hypothyroid   . Obesity   . Vaginal dysplasia 1980  . Rosacea   . Chest pain 11/2002    cardiac cath neg   . Back pain   . IBS (irritable bowel syndrome)     with diarrhea  . Asthma     as a child  . Adenomatous colon polyp 12/2007  . Hemorrhoids   . Vertigo   . Hiatal hernia 12/2007    EGD     Assessment: 28 YOF with Afib admitted 10/11/11 for increasing weakness and leathargy.  Pharmacy consulted to dose warfarin.  INR slow to increase- needed several days of double home dose.  Now INR 1.8 < goal 2-3 but increasing and I suspect will continue.  Last CBC 3/13 stable will check tomorrow. Home warfarin dose 2.5 mg daily, admission INR  subtherapeutic.  Goal of Therapy:  INR 2-3    Plan:  - Coumadin 2.5 mg PO today - will probably need combo of 2.5 TTSS/5 MWF or maybe just MF - Daily PT/INR CBC in AM

## 2011-10-14 NOTE — Progress Notes (Signed)
Clinical Social Work Department BRIEF PSYCHOSOCIAL ASSESSMENT 10/14/2011  Patient:  [name], [name]     Account Number:  [id number]     Admit date:  10/11/2011  Clinical Social Worker:  Juliette Mangle  Date/Time:  10/14/2011 03:30 PM  Referred by:  Physician  Date Referred:  10/13/2011 Referred for  SNF Placement   Other Referral:   Interview type:  Patient Other interview type:   Family-son    PSYCHOSOCIAL DATA Living Status:  HUSBAND Admitted from facility:   Level of care:   Primary support name:  Leta Speller Primary support relationship to patient:  CHILD, ADULT Degree of support available:   Very Good    CURRENT CONCERNS Current Concerns  Post-Acute Placement   Other Concerns:    SOCIAL WORK ASSESSMENT / PLAN CSW met with patient and pt's son at bedside to offer support and also discuss possible SNF placement. CSW provided patient with choice list but the patient verbalized that she was not interested in SNF placement. Pt. would like to return home with [facility]. CSW contacted  Case Manager to inform her of this. CSW will continue to follow and check in with the pt on Monday.   Assessment/plan status:  Psychosocial Support/Ongoing Assessment of Needs Other assessment/ plan:   Information/referral to community resources:   Case Managment    PATIENT'S/FAMILY'S RESPONSE TO PLAN OF CARE: Patient and patient's son were very appreciative and receptive to information and support provided by CSW. CSW contacted the CM to arrange [facility] on Monday for the patient. CSW will continue to follow if the plan for d/c home changes.

## 2011-10-15 ENCOUNTER — Inpatient Hospital Stay (HOSPITAL_COMMUNITY): Payer: Medicare Other

## 2011-10-15 LAB — URINE CULTURE: Culture  Setup Time: 201303141520

## 2011-10-15 LAB — CBC
HCT: 40.6 % (ref 36.0–46.0)
Platelets: 174 10*3/uL (ref 150–400)
RBC: 4.36 MIL/uL (ref 3.87–5.11)
RDW: 13.7 % (ref 11.5–15.5)
WBC: 5 10*3/uL (ref 4.0–10.5)

## 2011-10-15 LAB — GLUCOSE, CAPILLARY
Glucose-Capillary: 181 mg/dL — ABNORMAL HIGH (ref 70–99)
Glucose-Capillary: 99 mg/dL (ref 70–99)

## 2011-10-15 LAB — HEMOGLOBIN A1C: Hgb A1c MFr Bld: 11 % — ABNORMAL HIGH (ref <5.7)

## 2011-10-15 LAB — PROTIME-INR: INR: 2.11 — ABNORMAL HIGH (ref 0.00–1.49)

## 2011-10-15 MED ORDER — FUROSEMIDE 10 MG/ML IJ SOLN
40.0000 mg | Freq: Once | INTRAMUSCULAR | Status: AC
  Administered 2011-10-15: 40 mg via INTRAVENOUS
  Filled 2011-10-15: qty 4

## 2011-10-15 MED ORDER — WARFARIN SODIUM 2.5 MG PO TABS
2.5000 mg | ORAL_TABLET | Freq: Once | ORAL | Status: AC
  Administered 2011-10-15: 2.5 mg via ORAL
  Filled 2011-10-15: qty 1

## 2011-10-15 MED ORDER — FUROSEMIDE 10 MG/ML IJ SOLN
40.0000 mg | Freq: Every day | INTRAMUSCULAR | Status: DC
  Administered 2011-10-16: 40 mg via INTRAVENOUS
  Filled 2011-10-15: qty 4

## 2011-10-15 MED ORDER — LEVALBUTEROL HCL 0.63 MG/3ML IN NEBU
0.6300 mg | INHALATION_SOLUTION | RESPIRATORY_TRACT | Status: DC
  Administered 2011-10-15 (×3): 0.63 mg via RESPIRATORY_TRACT
  Filled 2011-10-15 (×6): qty 3

## 2011-10-15 MED ORDER — FLUCONAZOLE 100 MG PO TABS
100.0000 mg | ORAL_TABLET | Freq: Every day | ORAL | Status: DC
  Administered 2011-10-15 – 2011-10-19 (×5): 100 mg via ORAL
  Filled 2011-10-15 (×5): qty 1

## 2011-10-15 MED ORDER — IPRATROPIUM BROMIDE 0.02 % IN SOLN
0.5000 mg | RESPIRATORY_TRACT | Status: DC
  Administered 2011-10-15 (×3): 0.5 mg via RESPIRATORY_TRACT
  Filled 2011-10-15 (×3): qty 2.5

## 2011-10-15 MED ORDER — LEVALBUTEROL HCL 0.63 MG/3ML IN NEBU
0.6300 mg | INHALATION_SOLUTION | Freq: Four times a day (QID) | RESPIRATORY_TRACT | Status: DC
  Administered 2011-10-16 (×2): 0.63 mg via RESPIRATORY_TRACT
  Filled 2011-10-15: qty 3

## 2011-10-15 MED ORDER — FUROSEMIDE 10 MG/ML IJ SOLN: 40.0000 mg | Freq: Every day | INTRAMUSCULAR | Status: DC

## 2011-10-15 MED ORDER — IPRATROPIUM BROMIDE 0.02 % IN SOLN
0.5000 mg | Freq: Four times a day (QID) | RESPIRATORY_TRACT | Status: DC
  Administered 2011-10-16 (×2): 0.5 mg via RESPIRATORY_TRACT
  Filled 2011-10-15 (×2): qty 2.5

## 2011-10-15 MED ORDER — INDAPAMIDE 2.5 MG PO TABS: 2.5000 mg | ORAL_TABLET | Freq: Every day | ORAL | Status: DC

## 2011-10-15 MED ORDER — INSULIN ASPART 100 UNIT/ML ~~LOC~~ SOLN
3.0000 [IU] | Freq: Three times a day (TID) | SUBCUTANEOUS | Status: DC
  Administered 2011-10-15 – 2011-10-20 (×13): 3 [IU] via SUBCUTANEOUS

## 2011-10-15 NOTE — Progress Notes (Signed)
ANTICOAGULATION CONSULT NOTE - Follow Up Consult  Pharmacy Consult for  Coumadin Indication: atrial fibrillation  Allergies  Allergen Reactions  . Lantus     Itching   . Latex     Rash   . Metformin And Related     Severe diarrhea   . Metoclopramide Hcl     REACTION: unspecified  . Nsaids     REACTION: unspecified  . Promethazine Hcl     REACTION: unspecified    Patient Measurements: Height: 5\' 2"  (157.5 cm) Weight: 244 lb (110.678 kg) IBW/kg (Calculated) : 50.1   Vital Signs: Temp: 98.4 F (36.9 C) (03/16 0500) Temp src: Oral (03/16 0500) BP: 121/88 mmHg (03/16 0845) Pulse Rate: 99  (03/16 0845)  Labs:  Alvira Philips 10/15/11 0655 10/14/11 0858 10/13/11 0515 10/12/11 1555  HGB 13.5 -- -- --  HCT 40.6 -- -- --  PLT 174 -- -- --  APTT -- -- -- --  LABPROT 24.0* 21.6* 18.7* --  INR 2.11* 1.84* 1.53* --  HEPARINUNFRC -- -- -- --  CREATININE -- -- -- --  CKTOTAL -- -- -- 155  CKMB -- -- -- 2.5  TROPONINI -- -- -- <0.30   Estimated Creatinine Clearance: 45.1 ml/min (by C-G formula based on Cr of 1.05).  Assessment:   INR back into therapeutic range.  CBC stable.    Day #3 Rocephin for bronchitis    Day # 4/5 Tamiflu   Day #1 Fluconazole 100 mg PO daily for yeast in urine.     Sometimes effects INR.  Goal of Therapy:  INR 2-3   Plan:    Will repeat Coumadin 2.5 mg today.   Continue daily PT/INR.    Dennie Fetters, RPh Pager: [phone] 10/15/2011,1:42 PM

## 2011-10-15 NOTE — Progress Notes (Signed)
Patient ID: [name]  female  XLK:[id number]    DOB: Apr 10, 1923    DOA: 10/11/2011  PCP: [name], MD, MD  Subjective: Appears to be somewhat more short of breath, resting in bed, feels somewhat nauseas today  Objective: Weight change: 2.268 kg (5 lb)  Intake/Output Summary (Last 24 hours) at 10/15/11 1322 Last data filed at 10/15/11 0900  Gross per 24 hour  Intake    120 ml  Output      3 ml  Net    117 ml   Blood pressure 121/88, pulse 99, temperature 98.4 F (36.9 C), temperature source Oral, resp. rate 21, height 5\' 2"  (1.575 m), weight 110.678 kg (244 lb), last menstrual period 09/01/1961, SpO2 97.00%.  Physical Exam: General: Alert and awake,  not in any acute distress. HEENT: anicteric sclera, pupils reactive to light and accommodation, EOMI CVS: S1-S2 clear, no murmur rubs or gallops Chest: Bilateral wheezing, with bibasilar crackles  Abdomen: soft nontender, nondistended, normal bowel sounds, no organomegaly Extremities: no cyanosis, clubbing or edema noted bilaterally Neuro: Cranial nerves II-XII intact, no focal neurological deficits  Lab Results: Basic Metabolic Panel:  Lab 10/12/11 3664 10/11/11 1622  NA 137 135  K 3.6 3.7  CL 99 97  CO2 26 25  GLUCOSE 278* 242*  BUN 22 18  CREATININE 1.05 0.95  CALCIUM 8.6 9.1  MG -- --  PHOS -- --   Liver Function Tests:  Lab 10/12/11 0928 10/11/11 1622  AST 36 38*  ALT 20 19  ALKPHOS 38* 40  BILITOT 0.4 0.8  PROT 6.8 7.1  ALBUMIN 3.2* 3.5  CBC:  Lab 10/15/11 0655 10/12/11 0928 10/11/11 1622  WBC 5.0 6.1 --  NEUTROABS -- -- 5.1  HGB 13.5 13.1 --  HCT 40.6 39.8 --  MCV 93.1 95.9 --  PLT 174 198 --   Cardiac Enzymes:  Lab 10/12/11 1555 10/12/11 0928 10/11/11 2357  CKTOTAL 155 141 122  CKMB 2.5 2.8 2.9  CKMBINDEX -- -- --  TROPONINI <0.30 <0.30 <0.30   BNP: No components found with this basename: POCBNP:2 CBG:  Lab 10/15/11 1149 10/15/11 0806 10/14/11 2153 10/14/11 1638 10/14/11 1135    GLUCAP 224* 181* 221* 215* 241*     Micro Results: Recent Results (from the past 240 hour(s))  CULTURE, BLOOD (ROUTINE X 2)     Status: Normal (Preliminary result)   Collection Time   10/11/11  4:20 PM      Component Value Range Status Comment   Specimen Description BLOOD ARM LEFT   Final    Special Requests     Final    Value: BOTTLES DRAWN AEROBIC AND ANAEROBIC 10CC AER 5CC ANA   Culture  Setup Time [id number]   Final    Culture     Final    Value:        BLOOD CULTURE RECEIVED NO GROWTH TO DATE CULTURE WILL BE HELD FOR 5 DAYS BEFORE ISSUING A FINAL NEGATIVE REPORT   Report Status PENDING   Incomplete   CULTURE, BLOOD (ROUTINE X 2)     Status: Normal (Preliminary result)   Collection Time   10/11/11  4:55 PM      Component Value Range Status Comment   Specimen Description BLOOD LEFT ARM   Final    Special Requests     Final    Value: BOTTLES DRAWN AEROBIC AND ANAEROBIC BLUE 10CC RED 5CC   Culture  Setup Time [id number]   Final  Culture     Final    Value:        BLOOD CULTURE RECEIVED NO GROWTH TO DATE CULTURE WILL BE HELD FOR 5 DAYS BEFORE ISSUING A FINAL NEGATIVE REPORT   Report Status PENDING   Incomplete   URINE CULTURE     Status: Normal   Collection Time   10/13/11  2:40 PM      Component Value Range Status Comment   Specimen Description URINE, CATHETERIZED   Final    Special Requests NONE   Final    Culture  Setup Time [id number]   Final    Colony Count 80,000 COLONIES/ML   Final    Culture YEAST   Final    Report Status 10/15/2011 FINAL   Final     Studies/Results: Ct Head Wo Contrast  10/11/2011  *RADIOLOGY REPORT*  Clinical Data: Confusion and altered level of consciousness.  CT HEAD WITHOUT CONTRAST  Technique:  Contiguous axial images were obtained from the base of the skull through the vertex without contrast.  Comparison: 12/23/2009  Findings: Stable advanced small vessel ischemic changes in the periventricular white matter.  Stable cortical atrophy.  The brain demonstrates no evidence of hemorrhage, infarction, edema, mass effect, extra-axial fluid collection, hydrocephalus or mass lesion. The skull is unremarkable.  IMPRESSION: Stable atrophy and small vessel disease.  Original Report Authenticated By: Reola Calkins, M.D.   Dg Chest Port 1 View  10/11/2011  *RADIOLOGY REPORT*  Clinical Data: Chest pain.  Bilateral breast pains.  History of atrial fibrillation, diabetes, hypertension.  Fever.  PORTABLE CHEST - 1 VIEW  Comparison: 02/26/2010  Findings: The heart is enlarged.  There are perihilar bronchitic changes.  No focal consolidations or pleural effusions are identified.  No edema.  IMPRESSION: Cardiomegaly.  Bronchitic changes.  Original Report Authenticated By: Patterson Hammersmith, M.D.    Medications: Scheduled Meds:    . antiseptic oral rinse  15 mL Mouth Rinse BID  . cefTRIAXone (ROCEPHIN)  IV  1 g Intravenous Q24H  . chlorpheniramine-HYDROcodone  5 mL Oral Q12H  . docusate sodium  100 mg Oral BID  . fluconazole  100 mg Oral Daily  . fluticasone  2 puff Inhalation BID  . furosemide  40 mg Intravenous Once  . furosemide  40 mg Intravenous Daily  . guaiFENesin  600 mg Oral BID  . insulin aspart  0-15 Units Subcutaneous TID WC  . insulin aspart  0-5 Units Subcutaneous QHS  . insulin detemir  60 Units Subcutaneous BH-q7a  . ipratropium  0.5 mg Nebulization Q4H  . levalbuterol  0.63 mg Nebulization Q4H  . levothyroxine  150 mcg Oral Daily  . metoprolol  25 mg Oral BID  . oseltamivir  75 mg Oral BID  . pantoprazole  40 mg Oral Q1200  . sodium chloride  3 mL Intravenous Q12H  . warfarin  2.5 mg Oral ONCE-1800  . Warfarin - Pharmacist Dosing Inpatient   Does not apply q1800  . DISCONTD: fluticasone  2 puff Inhalation BID  . DISCONTD: indapamide  2.5 mg Oral Daily  . DISCONTD: insulin aspart  0-9 Units Subcutaneous TID WC  . DISCONTD: ipratropium  0.5 mg Nebulization Q6H  . DISCONTD: levalbuterol  0.63 mg Nebulization Q6H    Continuous Infusions:    Assessment/Plan: Principal Problem:  *Sepsis secondary to influenza B and UTI: - Continue Tamiflu (day #4), and Rocephin   Acute bronchitis/COPD: Still somewhat wheezing with a component of fluid overload - Continue scheduled nebs,  added Flovent - Stat BNP ordered and elevated, added Lasix IV x1, then daily, 2-D echo (prior echo in 2007, EF was apparently normal) - Repeat 2 view chest x-ray today  Active Problems:  HYPOTHYROIDISM: Continue levothyroxin   DIABETES MELLITUS, TYPE II: Blood sugar in 200s today  - Increase sliding scale to moderate, continue Levemir and qhs coverage, added meal coverage    HYPERTENSION: Stable   Atrial fibrillation: Currently rate controlled, on Coumadin    Generalized weakness: PT recommending skilled nursing facility, however patient is not interested in skilled nursing facility.   DVT Prophylaxis: On Coumadin  Code Status: Full code  Disposition: Not medically ready, hopefully on Monday   LOS: 4 days   Kahleah Crass M.D. [facility] 10/15/2011, 1:22 PM Pager: [phone]

## 2011-10-16 LAB — BASIC METABOLIC PANEL
BUN: 18 mg/dL (ref 6–23)
Chloride: 95 mEq/L — ABNORMAL LOW (ref 96–112)
GFR calc Af Amer: 60 mL/min — ABNORMAL LOW (ref >90)
GFR calc non Af Amer: 51 mL/min — ABNORMAL LOW (ref >90)
Glucose, Bld: 175 mg/dL — ABNORMAL HIGH (ref 70–99)
Potassium: 3.3 mEq/L — ABNORMAL LOW (ref 3.5–5.1)
Sodium: 137 mEq/L (ref 135–145)

## 2011-10-16 LAB — GLUCOSE, CAPILLARY
Glucose-Capillary: 123 mg/dL — ABNORMAL HIGH (ref 70–99)
Glucose-Capillary: 109 mg/dL — ABNORMAL HIGH (ref 70–99)

## 2011-10-16 MED ORDER — LEVALBUTEROL HCL 0.63 MG/3ML IN NEBU
0.6300 mg | INHALATION_SOLUTION | RESPIRATORY_TRACT | Status: DC
  Administered 2011-10-16 – 2011-10-20 (×19): 0.63 mg via RESPIRATORY_TRACT
  Filled 2011-10-16 (×30): qty 3

## 2011-10-16 MED ORDER — PREDNISONE 50 MG PO TABS
60.0000 mg | ORAL_TABLET | Freq: Every day | ORAL | Status: DC
  Administered 2011-10-17: 60 mg via ORAL
  Filled 2011-10-16 (×2): qty 1

## 2011-10-16 MED ORDER — FUROSEMIDE 10 MG/ML IJ SOLN
40.0000 mg | Freq: Two times a day (BID) | INTRAMUSCULAR | Status: DC
  Administered 2011-10-16 – 2011-10-17 (×2): 40 mg via INTRAVENOUS
  Filled 2011-10-16 (×3): qty 4

## 2011-10-16 MED ORDER — WARFARIN SODIUM 2.5 MG PO TABS
2.5000 mg | ORAL_TABLET | Freq: Once | ORAL | Status: AC
  Administered 2011-10-16: 2.5 mg via ORAL
  Filled 2011-10-16: qty 1

## 2011-10-16 MED ORDER — IPRATROPIUM BROMIDE 0.02 % IN SOLN
0.5000 mg | RESPIRATORY_TRACT | Status: DC
  Administered 2011-10-16 – 2011-10-20 (×19): 0.5 mg via RESPIRATORY_TRACT
  Filled 2011-10-16 (×19): qty 2.5

## 2011-10-16 MED ORDER — POTASSIUM CHLORIDE CRYS ER 20 MEQ PO TBCR
40.0000 meq | EXTENDED_RELEASE_TABLET | Freq: Every day | ORAL | Status: DC
  Administered 2011-10-16 – 2011-10-18 (×3): 40 meq via ORAL
  Filled 2011-10-16 (×3): qty 2

## 2011-10-16 NOTE — Progress Notes (Signed)
ANTICOAGULATION CONSULT NOTE - Follow Up Consult  Pharmacy Consult for  Coumadin Indication: atrial fibrillation  Allergies  Allergen Reactions  . Lantus     Itching   . Latex     Rash   . Metformin And Related     Severe diarrhea   . Metoclopramide Hcl     REACTION: unspecified  . Nsaids     REACTION: unspecified  . Promethazine Hcl     REACTION: unspecified    Patient Measurements: Height: 5\' 2"  (157.5 cm) Weight: 244 lb (110.678 kg) IBW/kg (Calculated) : 50.1   Vital Signs: Temp: 97.7 F (36.5 C) (03/17 1300) Temp src: Oral (03/17 0500) BP: 107/71 mmHg (03/17 1300) Pulse Rate: 74  (03/17 1300)  Labs:  Basename 10/16/11 1103 10/16/11 0723 10/15/11 0655 10/14/11 0858  HGB -- -- 13.5 --  HCT -- -- 40.6 --  PLT -- -- 174 --  APTT -- -- -- --  LABPROT -- 24.3* 24.0* 21.6*  INR -- 2.14* 2.11* 1.84*  HEPARINUNFRC -- -- -- --  CREATININE 0.97 -- -- --  CKTOTAL -- -- -- --  CKMB -- -- -- --  TROPONINI -- -- -- --   Estimated Creatinine Clearance: 48.8 ml/min (by C-G formula based on Cr of 0.97).  Assessment:   INR remains therapeutic.  Home regimen was 2.5 mg daily.    Day #4 Rocephin for bronchitis    Day # 5/5 Tamiflu   Day #2 Fluconazole 100 mg PO daily for yeast in urine.     Sometimes effects INR, within 3-5 days.  Goal of Therapy:  INR 2-3   Plan:    Will repeat Coumadin 2.5 mg today.   Continue daily PT/INR.    Dennie Fetters, [place] Pager: [phone] 10/16/2011,1:44 PM

## 2011-10-16 NOTE — Progress Notes (Signed)
  Echocardiogram 2D Echocardiogram has been performed.  [name] 10/16/2011, 4:22 PM

## 2011-10-16 NOTE — Progress Notes (Signed)
Patient ID: [name]  female  ZOX:[id number]    DOB: 1923-03-21    DOA: 10/11/2011  PCP: [name], MD, MD  Subjective: Alert and awake, son at bedside, mental status significantly improved, shortness of breath improving  Objective: Weight change:   Intake/Output Summary (Last 24 hours) at 10/16/11 1144 Last data filed at 10/15/11 2229  Gross per 24 hour  Intake    360 ml  Output      0 ml  Net    360 ml   Blood pressure 109/55, pulse 88, temperature 98.3 F (36.8 C), temperature source Oral, resp. rate 18, height 5\' 2"  (1.575 m), weight 110.678 kg (244 lb), last menstrual period 09/01/1961, SpO2 95.00%.  Physical Exam: General: Alert and awake,  not in any acute distress. HEENT: anicteric sclera, pupils reactive to light and accommodation, EOMI CVS: S1-S2 clear, no murmur rubs or gallops Chest: Bilateral wheezing, with bibasilar crackles (slightly improved from yesterday) Abdomen: soft nontender, nondistended, normal bowel sounds, no organomegaly Extremities: no cyanosis, clubbing or edema noted bilaterally Neuro: Cranial nerves II-XII intact, no focal neurological deficits  Lab Results: Basic Metabolic Panel:  Lab 10/12/11 8119 10/11/11 1622  NA 137 135  K 3.6 3.7  CL 99 97  CO2 26 25  GLUCOSE 278* 242*  BUN 22 18  CREATININE 1.05 0.95  CALCIUM 8.6 9.1  MG -- --  PHOS -- --   Liver Function Tests:  Lab 10/12/11 0928 10/11/11 1622  AST 36 38*  ALT 20 19  ALKPHOS 38* 40  BILITOT 0.4 0.8  PROT 6.8 7.1  ALBUMIN 3.2* 3.5  CBC:  Lab 10/15/11 0655 10/12/11 0928 10/11/11 1622  WBC 5.0 6.1 --  NEUTROABS -- -- 5.1  HGB 13.5 13.1 --  HCT 40.6 39.8 --  MCV 93.1 95.9 --  PLT 174 198 --   Cardiac Enzymes:  Lab 10/12/11 1555 10/12/11 0928 10/11/11 2357  CKTOTAL 155 141 122  CKMB 2.5 2.8 2.9  CKMBINDEX -- -- --  TROPONINI <0.30 <0.30 <0.30   BNP: No components found with this basename: POCBNP:2 CBG:  Lab 10/16/11 0805 10/15/11 2114 10/15/11 1651  10/15/11 1149 10/15/11 0806  GLUCAP 121* 99 213* 224* 181*     Micro Results: Recent Results (from the past 240 hour(s))  CULTURE, BLOOD (ROUTINE X 2)     Status: Normal (Preliminary result)   Collection Time   10/11/11  4:20 PM      Component Value Range Status Comment   Specimen Description BLOOD ARM LEFT   Final    Special Requests     Final    Value: BOTTLES DRAWN AEROBIC AND ANAEROBIC 10CC AER 5CC ANA   Culture  Setup Time [id number]   Final    Culture     Final    Value:        BLOOD CULTURE RECEIVED NO GROWTH TO DATE CULTURE WILL BE HELD FOR 5 DAYS BEFORE ISSUING A FINAL NEGATIVE REPORT   Report Status PENDING   Incomplete   CULTURE, BLOOD (ROUTINE X 2)     Status: Normal (Preliminary result)   Collection Time   10/11/11  4:55 PM      Component Value Range Status Comment   Specimen Description BLOOD LEFT ARM   Final    Special Requests     Final    Value: BOTTLES DRAWN AEROBIC AND ANAEROBIC BLUE 10CC RED 5CC   Culture  Setup Time [id number]   Final    Culture  Final    Value:        BLOOD CULTURE RECEIVED NO GROWTH TO DATE CULTURE WILL BE HELD FOR 5 DAYS BEFORE ISSUING A FINAL NEGATIVE REPORT   Report Status PENDING   Incomplete   URINE CULTURE     Status: Normal   Collection Time   10/13/11  2:40 PM      Component Value Range Status Comment   Specimen Description URINE, CATHETERIZED   Final    Special Requests NONE   Final    Culture  Setup Time [id number]   Final    Colony Count 80,000 COLONIES/ML   Final    Culture YEAST   Final    Report Status 10/15/2011 FINAL   Final     Studies/Results: Ct Head Wo Contrast  10/11/2011  *RADIOLOGY REPORT*  Clinical Data: Confusion and altered level of consciousness.  CT HEAD WITHOUT CONTRAST  Technique:  Contiguous axial images were obtained from the base of the skull through the vertex without contrast.  Comparison: 12/23/2009  Findings: Stable advanced small vessel ischemic changes in the periventricular white matter.   Stable cortical atrophy. The brain demonstrates no evidence of hemorrhage, infarction, edema, mass effect, extra-axial fluid collection, hydrocephalus or mass lesion. The skull is unremarkable.  IMPRESSION: Stable atrophy and small vessel disease.  Original Report Authenticated By: Reola Calkins, M.D.   Dg Chest Port 1 View  10/11/2011  *RADIOLOGY REPORT*  Clinical Data: Chest pain.  Bilateral breast pains.  History of atrial fibrillation, diabetes, hypertension.  Fever.  PORTABLE CHEST - 1 VIEW  Comparison: 02/26/2010  Findings: The heart is enlarged.  There are perihilar bronchitic changes.  No focal consolidations or pleural effusions are identified.  No edema.  IMPRESSION: Cardiomegaly.  Bronchitic changes.  Original Report Authenticated By: Patterson Hammersmith, M.D.    Medications: Scheduled Meds:    . antiseptic oral rinse  15 mL Mouth Rinse BID  . cefTRIAXone (ROCEPHIN)  IV  1 g Intravenous Q24H  . chlorpheniramine-HYDROcodone  5 mL Oral Q12H  . docusate sodium  100 mg Oral BID  . fluconazole  100 mg Oral Daily  . fluticasone  2 puff Inhalation BID  . furosemide  40 mg Intravenous Once  . furosemide  40 mg Intravenous Q12H  . guaiFENesin  600 mg Oral BID  . insulin aspart  0-15 Units Subcutaneous TID WC  . insulin aspart  0-5 Units Subcutaneous QHS  . insulin aspart  3 Units Subcutaneous TID WC  . insulin detemir  60 Units Subcutaneous BH-q7a  . ipratropium  0.5 mg Nebulization Q4H  . levalbuterol  0.63 mg Nebulization Q4H  . levothyroxine  150 mcg Oral Daily  . metoprolol  25 mg Oral BID  . oseltamivir  75 mg Oral BID  . pantoprazole  40 mg Oral Q1200  . predniSONE  60 mg Oral Q breakfast  . sodium chloride  3 mL Intravenous Q12H  . warfarin  2.5 mg Oral ONCE-1800  . Warfarin - Pharmacist Dosing Inpatient   Does not apply q1800  . DISCONTD: furosemide  40 mg Intravenous Daily  . DISCONTD: furosemide  40 mg Intravenous Daily  . DISCONTD: indapamide  2.5 mg Oral Daily  .  DISCONTD: ipratropium  0.5 mg Nebulization Q4H  . DISCONTD: ipratropium  0.5 mg Nebulization Q6H  . DISCONTD: levalbuterol  0.63 mg Nebulization Q4H  . DISCONTD: levalbuterol  0.63 mg Nebulization Q6H   Continuous Infusions:    Assessment/Plan: Principal Problem:  *Sepsis  secondary to influenza B and UTI: - Continue Tamiflu x 5days, and Rocephin   Acute bronchitis/COPD: Still somewhat wheezing with a component of fluid overload - Continue scheduled nebs (increase frequency to every 4 hours), added Flovent, placed on by mouth prednisone - BNP elevated, continue IV Lasix  2-D echo (prior echo in 2007, EF was apparently normal)  Active Problems:  HYPOTHYROIDISM: Continue levothyroxin   DIABETES MELLITUS, TYPE II: Blood sugar in 200s today  - Increase sliding scale to moderate, continue Levemir and qhs coverage, added meal coverage    HYPERTENSION: Stable   Atrial fibrillation: Currently rate controlled, on Coumadin    Generalized weakness: PT recommending skilled nursing facility, however patient is not interested in skilled nursing facility.   DVT Prophylaxis: On Coumadin  Code Status: Full code  Disposition: Not medically ready, hopefully tomorrow   LOS: 5 days   Roseline Ebarb M.D. [facility] 10/16/2011, 11:44 AM Pager: [phone]

## 2011-10-17 LAB — GLUCOSE, CAPILLARY
Glucose-Capillary: 234 mg/dL — ABNORMAL HIGH (ref 70–99)
Glucose-Capillary: 252 mg/dL — ABNORMAL HIGH (ref 70–99)

## 2011-10-17 LAB — BASIC METABOLIC PANEL
CO2: 29 mEq/L (ref 19–32)
Calcium: 8.9 mg/dL (ref 8.4–10.5)
Creatinine, Ser: 1.14 mg/dL — ABNORMAL HIGH (ref 0.50–1.10)
GFR calc Af Amer: 49 mL/min — ABNORMAL LOW (ref >90)
Sodium: 137 mEq/L (ref 135–145)

## 2011-10-17 LAB — PROTIME-INR
INR: 2.26 — ABNORMAL HIGH (ref 0.00–1.49)
Prothrombin Time: 25.3 seconds — ABNORMAL HIGH (ref 11.6–15.2)

## 2011-10-17 MED ORDER — FUROSEMIDE 40 MG PO TABS
40.0000 mg | ORAL_TABLET | Freq: Two times a day (BID) | ORAL | Status: DC
  Administered 2011-10-17 – 2011-10-20 (×6): 40 mg via ORAL
  Filled 2011-10-17 (×9): qty 1

## 2011-10-17 MED ORDER — POTASSIUM CHLORIDE CRYS ER 20 MEQ PO TBCR
40.0000 meq | EXTENDED_RELEASE_TABLET | Freq: Once | ORAL | Status: AC
  Administered 2011-10-17: 40 meq via ORAL
  Filled 2011-10-17: qty 2

## 2011-10-17 MED ORDER — WARFARIN SODIUM 2.5 MG PO TABS
2.5000 mg | ORAL_TABLET | Freq: Every day | ORAL | Status: DC
  Administered 2011-10-17 – 2011-10-19 (×3): 2.5 mg via ORAL
  Filled 2011-10-17 (×4): qty 1

## 2011-10-17 MED ORDER — PREDNISONE 20 MG PO TABS
40.0000 mg | ORAL_TABLET | Freq: Every day | ORAL | Status: DC
  Filled 2011-10-17 (×2): qty 2

## 2011-10-17 NOTE — Progress Notes (Signed)
10/17/2011 Fransico Michael SPARKS Case Management Note [phone]  HOME HEALTH AGENCIES SERVING [facility]   Agencies that are Medicare-Certified and are affiliated with [facility] Agency  Telephone Number Address  [facility]   The [facility] has ownership interest in this company; however, you are under no obligation to use this agency. [phone] or  [phone] [address] [place], [place] [address]   Agencies that are Medicare-Certified and are not affiliated with [facility] Agency Telephone Number Address  [facility] [phone] Fax [phone] [address] [place], [place]  [address]  [facility] [phone] or [phone] Fax [phone] [address] [place], [place] [address]  [facility] [phone] Fax [phone] [address] [place], [place] [address]  [facility] [phone] Fax [phone] [address] [place], [place]  [address]  [facility] [phone] Fax [phone] [address] Suite [place], [place] [address]  [facility] [phone] [address] [place], [place] [address]  [facility] [phone]  [address] [place], [place] [address]  [facility] [phone] or [phone] Fax [phone] [address] [place], [place]  [address]  [facility] [phone] Fax [phone] [address] [place], [place]  [address]  [facility]  [phone] Fax [phone] [address] [place], [place] [address]               Agencies that are not Medicare-Certified and are not affiliated with [facility] Agency Telephone Number Address  [facility] [place] [phone] or [phone] Fax [phone] [address] [place]  [address]  [facility] [phone] Fax [phone] [address] [place], [place]  [address]  [facility]  [phone] Fax [phone] [address] [place], [place] [address]  [facility] [phone] Fax [phone] [address] [place], [place] [address]  [facility] [phone] or [phone] Fax [phone] [address] [place], [place]  [address]  [facility] [phone] or [phone] Fax [phone] [address] [place], [place]  [address]  [facility] [phone] Fax [phone] [address] [place], [place]  [address]  [facility] [phone] [address] [place], [place]  [address]  [facility] [phone] Fax [phone] [address] [place], [place]  [address]  [facility] [phone] Fax [phone] [address] [place], [place]  [address]  [facility] [phone] Fax [phone] [address] [place], [place] [address]  [facility] [phone] Fax [phone] [address] [place], [place]  [address]   In to offer patient choice of home health agencies. [facility] chosen. Will make appropriate referrals.

## 2011-10-17 NOTE — Progress Notes (Signed)
Patient ID: [name]  female  ZOX:[id number]    DOB: [birth year]    DOA: 10/11/2011  PCP: [name], MD, MD  Subjective: Alert and awake, shortness of breath and wheezing improving  Objective: Weight change:   Intake/Output Summary (Last 24 hours) at 10/17/11 1357 Last data filed at 10/17/11 0800  Gross per 24 hour  Intake    600 ml  Output      0 ml  Net    600 ml   Blood pressure 106/61, pulse 90, temperature 98 F (36.7 C), temperature source Oral, resp. rate 20, height 5\' 2"  (1.575 m), weight 108.2 kg (238 lb 8.6 oz), last menstrual period 09/01/1961, SpO2 93.00%.  Physical Exam: General: Alert and awake,  not in any acute distress. HEENT: anicteric sclera, pupils reactive to light and accommodation, EOMI CVS: S1-S2 clear, no murmur rubs or gallops Chest: Bilateral wheezing, with bibasilar crackles (significantly improved from yesterday) Abdomen: soft nontender, nondistended, normal bowel sounds, no organomegaly Extremities: no cyanosis, clubbing or edema noted bilaterally Neuro: Cranial nerves II-XII intact, no focal neurological deficits  Lab Results: Basic Metabolic Panel:  Lab 10/17/11 8119 10/16/11 1103  NA 137 137  K 3.1* 3.3*  CL 94* 95*  CO2 29 32  GLUCOSE 135* 175*  BUN 24* 18  CREATININE 1.14* 0.97  CALCIUM 8.9 9.1  MG -- --  PHOS -- --   Liver Function Tests:  Lab 10/12/11 0928 10/11/11 1622  AST 36 38*  ALT 20 19  ALKPHOS 38* 40  BILITOT 0.4 0.8  PROT 6.8 7.1  ALBUMIN 3.2* 3.5  CBC:  Lab 10/15/11 0655 10/12/11 0928 10/11/11 1622  WBC 5.0 6.1 --  NEUTROABS -- -- 5.1  HGB 13.5 13.1 --  HCT 40.6 39.8 --  MCV 93.1 95.9 --  PLT 174 198 --   Cardiac Enzymes:  Lab 10/12/11 1555 10/12/11 0928 10/11/11 2357  CKTOTAL 155 141 122  CKMB 2.5 2.8 2.9  CKMBINDEX -- -- --  TROPONINI <0.30 <0.30 <0.30   BNP: No components found with this basename: POCBNP:2 CBG:  Lab 10/17/11 1130 10/17/11 0740 10/17/11 0442 10/16/11 2127 10/16/11 1656    GLUCAP 234* 135* 108* 109* 123*     Micro Results: Recent Results (from the past 240 hour(s))  CULTURE, BLOOD (ROUTINE X 2)     Status: Normal (Preliminary result)   Collection Time   10/11/11  4:20 PM      Component Value Range Status Comment   Specimen Description BLOOD ARM LEFT   Final    Special Requests     Final    Value: BOTTLES DRAWN AEROBIC AND ANAEROBIC 10CC AER 5CC ANA   Culture  Setup Time [id number]   Final    Culture     Final    Value:        BLOOD CULTURE RECEIVED NO GROWTH TO DATE CULTURE WILL BE HELD FOR 5 DAYS BEFORE ISSUING A FINAL NEGATIVE REPORT   Report Status PENDING   Incomplete   CULTURE, BLOOD (ROUTINE X 2)     Status: Normal (Preliminary result)   Collection Time   10/11/11  4:55 PM      Component Value Range Status Comment   Specimen Description BLOOD LEFT ARM   Final    Special Requests     Final    Value: BOTTLES DRAWN AEROBIC AND ANAEROBIC BLUE 10CC RED 5CC   Culture  Setup Time [id number]   Final    Culture  Final    Value:        BLOOD CULTURE RECEIVED NO GROWTH TO DATE CULTURE WILL BE HELD FOR 5 DAYS BEFORE ISSUING A FINAL NEGATIVE REPORT   Report Status PENDING   Incomplete   URINE CULTURE     Status: Normal   Collection Time   10/13/11  2:40 PM      Component Value Range Status Comment   Specimen Description URINE, CATHETERIZED   Final    Special Requests NONE   Final    Culture  Setup Time [id number]   Final    Colony Count 80,000 COLONIES/ML   Final    Culture YEAST   Final    Report Status 10/15/2011 FINAL   Final     Studies/Results: Ct Head Wo Contrast  10/11/2011  *RADIOLOGY REPORT*  Clinical Data: Confusion and altered level of consciousness.  CT HEAD WITHOUT CONTRAST  Technique:  Contiguous axial images were obtained from the base of the skull through the vertex without contrast.  Comparison: 12/23/2009  Findings: Stable advanced small vessel ischemic changes in the periventricular white matter.  Stable cortical atrophy.  The brain demonstrates no evidence of hemorrhage, infarction, edema, mass effect, extra-axial fluid collection, hydrocephalus or mass lesion. The skull is unremarkable.  IMPRESSION: Stable atrophy and small vessel disease.  Original Report Authenticated By: Reola Calkins, M.D.   Dg Chest Port 1 View  10/11/2011  *RADIOLOGY REPORT*  Clinical Data: Chest pain.  Bilateral breast pains.  History of atrial fibrillation, diabetes, hypertension.  Fever.  PORTABLE CHEST - 1 VIEW  Comparison: 02/26/2010  Findings: The heart is enlarged.  There are perihilar bronchitic changes.  No focal consolidations or pleural effusions are identified.  No edema.  IMPRESSION: Cardiomegaly.  Bronchitic changes.  Original Report Authenticated By: Patterson Hammersmith, M.D.    Medications: Scheduled Meds:    . antiseptic oral rinse  15 mL Mouth Rinse BID  . cefTRIAXone (ROCEPHIN)  IV  1 g Intravenous Q24H  . chlorpheniramine-HYDROcodone  5 mL Oral Q12H  . docusate sodium  100 mg Oral BID  . fluconazole  100 mg Oral Daily  . fluticasone  2 puff Inhalation BID  . furosemide  40 mg Intravenous Q12H  . guaiFENesin  600 mg Oral BID  . insulin aspart  0-15 Units Subcutaneous TID WC  . insulin aspart  0-5 Units Subcutaneous QHS  . insulin aspart  3 Units Subcutaneous TID WC  . insulin detemir  60 Units Subcutaneous BH-q7a  . ipratropium  0.5 mg Nebulization Q4H  . levalbuterol  0.63 mg Nebulization Q4H  . levothyroxine  150 mcg Oral Daily  . metoprolol  25 mg Oral BID  . oseltamivir  75 mg Oral BID  . pantoprazole  40 mg Oral Q1200  . potassium chloride  40 mEq Oral Daily  . potassium chloride  40 mEq Oral Once  . predniSONE  40 mg Oral Q breakfast  . sodium chloride  3 mL Intravenous Q12H  . warfarin  2.5 mg Oral ONCE-1800  . warfarin  2.5 mg Oral q1800  . Warfarin - Pharmacist Dosing Inpatient   Does not apply q1800  . DISCONTD: predniSONE  60 mg Oral Q breakfast   Continuous Infusions:     Assessment/Plan: Principal Problem:  *Sepsis secondary to influenza B and UTI: - Completed Tamiflu x 5days, - On Rocephin for bronchitis/UTI  Acute bronchitis/COPD: Significant improvement,  component of fluid overload - Continue scheduled nebs (increase frequency to every 4  hours), added Flovent, placed on by mouth prednisone - BNP elevated, transitioned to by mouth Lasix, 2-D echo done, EF 45-50% with mild hypokinesis of basal mid inferior and inferior septal myocardium  - Taper prednisone to 40 mg from tomorrow morning  Acute on chronic systolic CHF exacerbation: Improving - Transitioned to by mouth Lasix today, per patient takes furosemide 20 mg daily at home, and extra 20 mg if fluid overload  Active Problems:  HYPOTHYROIDISM: Continue levothyroxin   DIABETES MELLITUS, TYPE II: Uncontrolled likely secondary to prednisone   - Increase sliding scale to moderate, continue Levemir and qhs coverage, added meal coverage    HYPERTENSION: Stable   Atrial fibrillation: Currently rate controlled, on Coumadin    Generalized weakness: PT recommending skilled nursing facility, however patient is not interested in skilled nursing facility.   DVT Prophylaxis: On Coumadin  Code Status: Full code  Disposition: Not medically ready, hopefully tomorrow   LOS: 6 days   Hilary Milks M.D. [facility] 10/17/2011, 1:57 PM Pager: [phone]

## 2011-10-17 NOTE — Progress Notes (Signed)
ANTICOAGULATION CONSULT NOTE - Follow Up Consult  Pharmacy Consult for  Coumadin Indication: atrial fibrillation  Allergies  Allergen Reactions  . Lantus     Itching   . Latex     Rash   . Metformin And Related     Severe diarrhea   . Metoclopramide Hcl     REACTION: unspecified  . Nsaids     REACTION: unspecified  . Promethazine Hcl     REACTION: unspecified    Patient Measurements: Height: 5\' 2"  (157.5 cm) Weight: 238 lb 8.6 oz (108.2 kg) IBW/kg (Calculated) : 50.1   Vital Signs: Temp: 98 F (36.7 C) (03/18 0500) Temp src: Oral (03/18 0500) BP: 106/61 mmHg (03/18 1020) Pulse Rate: 90  (03/18 1020)  Labs:  Basename 10/17/11 0650 10/16/11 1103 10/16/11 0723 10/15/11 0655  HGB -- -- -- 13.5  HCT -- -- -- 40.6  PLT -- -- -- 174  APTT -- -- -- --  LABPROT 25.3* -- 24.3* 24.0*  INR 2.26* -- 2.14* 2.11*  HEPARINUNFRC -- -- -- --  CREATININE 1.14* 0.97 -- --  CKTOTAL -- -- -- --  CKMB -- -- -- --  TROPONINI -- -- -- --   Estimated Creatinine Clearance: 41 ml/min (by C-G formula based on Cr of 1.14).  Assessment:88 YOF with Afib admitted 10/11/11 for increasing weakness and lethargy. Admit INR 1.47. Home regimen was 2.5 mg daily.  Infectious Disease: Afebrile. WBC 5. Sepsis due to influenza B and UTI. CTX MD#7 for bronchitis & UTI (yeast in urine), Fluconazole #3, s/p Tamiflu  for +influenza B,  Cardiovascular: hx Afib/HTN/HLD -121/88, HR 99. On IV furosemide, lopressor, K,  (indapamidePTA). K only 3.1 today. No supplementation ordered yet? F/u results of 2D echo today.  Endocrinology: hx DM - CBGs 108-246, on SSI + meal coverage + Levemir 60/day, Hypothyroidism on Synthroid - TSH ok  Gastrointestinal / Nutrition: hx GERD/IBS - carb modified diet, Protonix  Nephrology: SCr up 1.14  Pulmonary: hx asthma/COPD, wheezing with fluid overload - guaifenesin, tussionex, Flovent, Atrovent, Xopenex, Prednisone.  Hematology / Oncology: CBC ok 3/16  Goal of Therapy:    INR 2-3   Plan:   Supplement K? Coumadin 2.5mg  daily  Pasty Spillers, PharmD 10/17/2011,11:02 AM

## 2011-10-17 NOTE — Progress Notes (Signed)
10/17/2011 [facility], [facility] Case Management Note [phone]    CARE MANAGEMENT NOTE 10/17/2011  Patient:  [name], [name]   Account Number:  [id number]  Date Initiated:  10/17/2011  Documentation initiated by:  Fransico Michael  Subjective/Objective Assessment:   admitted on 10/11/11 with generalized weakness and fever.     Action/Plan:   prior to admission, patient lived at home with spouse. Independent with ADLs.   Anticipated DC Date:  10/18/2011   Anticipated DC Plan:  HOME W HOME HEALTH SERVICES      DC Planning Services  CM consult      [facility] Choice  HOME HEALTH   Choice offered to / List presented to:  [name]        HH arranged  HH-2 PT  HH-1 RN      [facility] agency  [facility]   Status of service:  In process, will continue to follow Medicare Important Message given?   (If response is "NO", the following Medicare IM given date fields will be blank) Date Medicare IM given:   Date Additional Medicare IM given:    Discharge Disposition:    Per UR Regulation:  Reviewed for med. necessity/level of care/duration of stay  If discussed at Long Length of Stay Meetings, dates discussed:    Comments:  PCP: Florence CannerLeta Speller (son) [phone]  10/17/11-1547-J.Manjinder Breau,RN,BSN  [phone]      Spoke with Yvone Neu with [facility]. Given Referral information.  10/17/11-1218-J.Lutricia Horsfall  [phone]      78yo female patient admitted on 10/11/11 with generalized weakness and fever. Prior to admission, patient lived at home with spouse and was independent with ADLs. Physical therapy evaluated patient and recommended skilled nursing facility for short term rehab. Patient refused and wants to go home. Spoke to patient regarding home health PT and RN. Patient agreed. Choice of agencies offered to patient. [facility] chosen by patient.

## 2011-10-18 LAB — CULTURE, BLOOD (ROUTINE X 2)
Culture  Setup Time: 201303130013
Culture: NO GROWTH

## 2011-10-18 LAB — BASIC METABOLIC PANEL
BUN: 37 mg/dL — ABNORMAL HIGH (ref 6–23)
CO2: 27 mEq/L (ref 19–32)
Chloride: 92 mEq/L — ABNORMAL LOW (ref 96–112)
Glucose, Bld: 339 mg/dL — ABNORMAL HIGH (ref 70–99)
Potassium: 4.9 mEq/L (ref 3.5–5.1)
Sodium: 134 mEq/L — ABNORMAL LOW (ref 135–145)

## 2011-10-18 LAB — GLUCOSE, CAPILLARY
Glucose-Capillary: 282 mg/dL — ABNORMAL HIGH (ref 70–99)
Glucose-Capillary: 273 mg/dL — ABNORMAL HIGH (ref 70–99)
Glucose-Capillary: 178 mg/dL — ABNORMAL HIGH (ref 70–99)
Glucose-Capillary: 239 mg/dL — ABNORMAL HIGH (ref 70–99)

## 2011-10-18 NOTE — Progress Notes (Signed)
ANTICOAGULATION CONSULT NOTE - Follow Up Consult  Pharmacy Consult for  Coumadin Indication: atrial fibrillation  Allergies  Allergen Reactions  . Lantus     Itching   . Latex     Rash   . Metformin And Related     Severe diarrhea   . Metoclopramide Hcl     REACTION: unspecified  . Nsaids     REACTION: unspecified  . Promethazine Hcl     REACTION: unspecified    Patient Measurements: Height: 5\' 5"  (165.1 cm) Weight: 242 lb 15.2 oz (110.2 kg) IBW/kg (Calculated) : 57   Vital Signs: Temp: 97.8 F (36.6 C) (03/19 0500) Temp src: Oral (03/19 0500) BP: 133/68 mmHg (03/19 0951) Pulse Rate: 86  (03/19 0951)  Labs:  Alvira Philips 10/18/11 0525 10/17/11 0650 10/16/11 1103 10/16/11 0723  HGB -- -- -- --  HCT -- -- -- --  PLT -- -- -- --  APTT -- -- -- --  LABPROT 24.7* 25.3* -- 24.3*  INR 2.19* 2.26* -- 2.14*  HEPARINUNFRC -- -- -- --  CREATININE 1.29* 1.14* 0.97 --  CKTOTAL -- -- -- --  CKMB -- -- -- --  TROPONINI -- -- -- --   Estimated Creatinine Clearance: 38.7 ml/min (by C-G formula based on Cr of 1.29).  Assessment:88 YOF with Afib admitted 10/11/11 for increasing weakness and lethargy. Admit INR 1.47. Home regimen was 2.5 mg daily. INR stable today at 2.19.  Infectious Disease: Afebrile. WBC 5. Sepsis due to influenza B and UTI. CTX MD#8 for bronchitis & UTI (yeast in urine), Fluconazole #4, s/p Tamiflu  for +influenza B,  Cardiovascular: hx Afib/HTN/HLD/CHF:VSS. On IV furosemide, lopressor, K,  (indapamide PTA). K replaced to 4.9 today. 2D Echo =EF 45-50%.   Endocrinology: hx DM - CBGs 135-282, on SSI + meal coverage + Levemir 60/day, Hypothyroidism on Synthroid - TSH ok  Gastrointestinal / Nutrition: hx GERD/IBS - carb modified diet, Protonix  Nephrology: SCr up 1.29  Pulmonary: hx asthma/COPD, wheezing with fluid overload - guaifenesin, tussionex, Flovent, Atrovent, Xopenex, taper Prednisone.  Hematology / Oncology: CBC ok 3/16  Goal of Therapy:  INR  2-3   Plan:  Coumadin 2.5mg  daily  Pasty Spillers, PharmD 10/18/2011,10:04 AM

## 2011-10-18 NOTE — Progress Notes (Signed)
Patient ID: [name]  female  AVW:[id number]    DOB: 02-Nov-1922    DOA: 10/11/2011  PCP: [name], MD, MD  Brief interim summary:  Patient is 78 year old female with history of diabetes type 2, atrial fibrillation on Coumadin, hypertension was brought to the T. or by patient's son as patient was found to be increasingly weak, lethargic and not been eating well. Patient was also noticed to be in altered mental status at the time of admission. She was found to have UTI and was started on Rocephin. Urine culture showed yeast and patient was started on Diflucan which can be discontinued on 04/22/12 to complete the course of UTI. Patient was also found to have influenza B positive, was started on Tamiflu which she has completed the course of. During hospitalization patient was found to have acute bronchitis and CHF exacerbation (possibly secondary to fluid overload). Patient was placed on IV Lasix, nebulizer treatments, O2 supplementation. She was initially placed on prednisone however she now refuses to take it. Patient did well on the above treatment and Lasix was changed to PO. She is reluctant to take twice a day Lasix, will continue twice a day Lasix today and DC on daily dose. Home health PT OT/RN will be set up for the patient at the time of disposition, she refuses skilled nursing facility.    Subjective: Alert and awake, shortness of breath and wheezing improving  Objective: Weight change: 2 kg (4 lb 6.6 oz)  Intake/Output Summary (Last 24 hours) at 10/18/11 1538 Last data filed at 10/18/11 0800  Gross per 24 hour  Intake    360 ml  Output      0 ml  Net    360 ml   Blood pressure 133/68, pulse 86, temperature 97.8 F (36.6 C), temperature source Oral, resp. rate 20, height 5\' 5"  (1.651 m), weight 110.2 kg (242 lb 15.2 oz), last menstrual period 09/01/1961, SpO2 96.00%.  Physical Exam: General: Alert and awake,  not in any acute distress. HEENT: anicteric sclera, pupils reactive  to light and accommodation, EOMI CVS: S1-S2 clear, no murmur rubs or gallops Chest: Bilateral wheezing, with bibasilar crackles (significantly improved from yesterday) Abdomen: soft nontender, nondistended, normal bowel sounds, no organomegaly Extremities: no cyanosis, clubbing or edema noted bilaterally Neuro: Cranial nerves II-XII intact, no focal neurological deficits  Lab Results: Basic Metabolic Panel:  Lab 10/18/11 8295 10/17/11 0650  NA 134* 137  K 4.9 3.1*  CL 92* 94*  CO2 27 29  GLUCOSE 339* 135*  BUN 37* 24*  CREATININE 1.29* 1.14*  CALCIUM 9.0 8.9  MG -- --  PHOS -- --   Liver Function Tests:  Lab 10/12/11 0928 10/11/11 1622  AST 36 38*  ALT 20 19  ALKPHOS 38* 40  BILITOT 0.4 0.8  PROT 6.8 7.1  ALBUMIN 3.2* 3.5  CBC:  Lab 10/15/11 0655 10/12/11 0928 10/11/11 1622  WBC 5.0 6.1 --  NEUTROABS -- -- 5.1  HGB 13.5 13.1 --  HCT 40.6 39.8 --  MCV 93.1 95.9 --  PLT 174 198 --   Cardiac Enzymes:  Lab 10/12/11 1555 10/12/11 0928 10/11/11 2357  CKTOTAL 155 141 122  CKMB 2.5 2.8 2.9  CKMBINDEX -- -- --  TROPONINI <0.30 <0.30 <0.30   BNP: No components found with this basename: POCBNP:2 CBG:  Lab 10/18/11 1152 10/18/11 0743 10/17/11 2106 10/17/11 1639 10/17/11 1130  GLUCAP 273* 282* 252* 148* 234*     Micro Results: Recent Results (from the  past 240 hour(s))  CULTURE, BLOOD (ROUTINE X 2)     Status: Normal   Collection Time   10/11/11  4:20 PM      Component Value Range Status Comment   Specimen Description BLOOD ARM LEFT   Final    Special Requests     Final    Value: BOTTLES DRAWN AEROBIC AND ANAEROBIC 10CC AER 5CC ANA   Culture  Setup Time [id number]   Final    Culture NO GROWTH 5 DAYS   Final    Report Status 10/18/2011 FINAL   Final   CULTURE, BLOOD (ROUTINE X 2)     Status: Normal   Collection Time   10/11/11  4:55 PM      Component Value Range Status Comment   Specimen Description BLOOD LEFT ARM   Final    Special Requests     Final     Value: BOTTLES DRAWN AEROBIC AND ANAEROBIC BLUE 10CC RED 5CC   Culture  Setup Time [id number]   Final    Culture NO GROWTH 5 DAYS   Final    Report Status 10/18/2011 FINAL   Final   URINE CULTURE     Status: Normal   Collection Time   10/13/11  2:40 PM      Component Value Range Status Comment   Specimen Description URINE, CATHETERIZED   Final    Special Requests NONE   Final    Culture  Setup Time [id number]   Final    Colony Count 80,000 COLONIES/ML   Final    Culture YEAST   Final    Report Status 10/15/2011 FINAL   Final     Studies/Results: Ct Head Wo Contrast  10/11/2011  *RADIOLOGY REPORT*  Clinical Data: Confusion and altered level of consciousness.  CT HEAD WITHOUT CONTRAST  Technique:  Contiguous axial images were obtained from the base of the skull through the vertex without contrast.  Comparison: 12/23/2009  Findings: Stable advanced small vessel ischemic changes in the periventricular white matter.  Stable cortical atrophy. The brain demonstrates no evidence of hemorrhage, infarction, edema, mass effect, extra-axial fluid collection, hydrocephalus or mass lesion. The skull is unremarkable.  IMPRESSION: Stable atrophy and small vessel disease.  Original Report Authenticated By: Reola Calkins, M.D.   Dg Chest Port 1 View  10/11/2011  *RADIOLOGY REPORT*  Clinical Data: Chest pain.  Bilateral breast pains.  History of atrial fibrillation, diabetes, hypertension.  Fever.  PORTABLE CHEST - 1 VIEW  Comparison: 02/26/2010  Findings: The heart is enlarged.  There are perihilar bronchitic changes.  No focal consolidations or pleural effusions are identified.  No edema.  IMPRESSION: Cardiomegaly.  Bronchitic changes.  Original Report Authenticated By: Patterson Hammersmith, M.D.    Medications: Scheduled Meds:    . antiseptic oral rinse  15 mL Mouth Rinse BID  . cefTRIAXone (ROCEPHIN)  IV  1 g Intravenous Q24H  . chlorpheniramine-HYDROcodone  5 mL Oral Q12H  . fluconazole  100 mg  Oral Daily  . fluticasone  2 puff Inhalation BID  . furosemide  40 mg Oral BID  . guaiFENesin  600 mg Oral BID  . insulin aspart  0-15 Units Subcutaneous TID WC  . insulin aspart  0-5 Units Subcutaneous QHS  . insulin aspart  3 Units Subcutaneous TID WC  . insulin detemir  60 Units Subcutaneous BH-q7a  . ipratropium  0.5 mg Nebulization Q4H  . levalbuterol  0.63 mg Nebulization Q4H  . levothyroxine  150 mcg Oral  Daily  . metoprolol  25 mg Oral BID  . pantoprazole  40 mg Oral Q1200  . potassium chloride  40 mEq Oral Once  . predniSONE  40 mg Oral Q breakfast  . sodium chloride  3 mL Intravenous Q12H  . warfarin  2.5 mg Oral q1800  . Warfarin - Pharmacist Dosing Inpatient   Does not apply q1800  . DISCONTD: potassium chloride  40 mEq Oral Daily   Continuous Infusions:    Assessment/Plan: Principal Problem:  *Sepsis secondary to influenza B and UTI: - Completed Tamiflu x 5days, on Diflucan for yeast UTI. Discontinued Rocephin  Acute bronchitis/COPD: Significant improvement,  component of fluid overload - Continue scheduled nebs,  Flovent, DC prednisone as patient is refusing to take it  - BNP elevated, continue PO BID Lasix today, 2-D echo done, EF 45-50% with mild hypokinesis of basal mid inferior and inferior septal myocardium, monitor renal function  Acute on chronic systolic CHF exacerbation: Improving - Cont PO Lasix BID (per patient takes furosemide 20 mg daily at home, and extra 20 mg if fluid overload)  Active Problems:  HYPOTHYROIDISM: Continue levothyroxin   DIABETES MELLITUS, TYPE II: Uncontrolled likely secondary to prednisone   -Prednisone discontinued today, hence no change in insulin regimen today - Continue sliding scale to moderate, continue Levemir and qhs coverage, meal coverage    HYPERTENSION: Stable   Atrial fibrillation: Currently rate controlled, on Coumadin    Generalized weakness: PT recommending skilled nursing facility, however patient is not  interested in skilled nursing facility.   DVT Prophylaxis: On Coumadin  Code Status: Full code  Disposition: Not medically ready, hopefully tomorrow to home with home health resources.   LOS: 7 days   , M.D. [facility] 10/18/2011, 3:38 PM Pager: [phone]

## 2011-10-18 NOTE — Progress Notes (Signed)
Physical Therapy Treatment Patient Details Name: [name] MRN: [id number] DOB: [birth year] Today's Date: 10/18/2011  PT Assessment/Plan  PT - Assessment/Plan Comments on Treatment Session: Patient with +flu now feeling much better.  Much improved from evaluation.  Should be able to go home with [facility] services to assist patient and his family. HHPT recommended.   PT Plan: Discharge plan needs to be updated;Frequency remains appropriate PT Frequency: Min 3X/week Follow Up Recommendations: Home health PT;Supervision/Assistance - 24 hour Equipment Recommended: None recommended by PT PT Goals  Acute Rehab PT Goals PT Goal: Sit at Edge Of Bed - Progress: Met PT Goal: Sit to Stand - Progress: Met PT Transfer Goal: Bed to Chair/Chair to Bed - Progress: Met PT Goal: Ambulate - Progress: Met  PT Treatment Precautions/Restrictions  Precautions Precautions: Fall Required Braces or Orthoses: No Restrictions Weight Bearing Restrictions: No Mobility (including Balance) Bed Mobility Supine to Sit: Not tested (comment) Scooting to [facility]: Not tested (comment) Transfers Sit to Stand: 5: Supervision;From elevated surface;With upper extremity assist;From bed Sit to Stand Details (indicate cue type and reason): Patient states, "Get out of my way".  Patient needs cues for proper hand placement and safety but likes to do her own thing. Stand to Sit: 5: Supervision;With upper extremity assist;With armrests;To chair/3-in-1 Stand to Sit Details: Uncontrolled descent but she did reach back. Stand Pivot Transfers: Not tested (comment) Ambulation/Gait Ambulation/Gait Assistance: 5: Supervision Ambulation/Gait Assistance Details (indicate cue type and reason): Patient with much better postural stability standing semi-upright and ambulating much better than before.  Patient ambulated to the bathroom and back to the bed.  Patient stopped at the sink on the way back to the bed to wash her hands in which she did  independently.  Patient cleaned her vaginal area independently but needed assist with her bottom.  Patient ambulated a total of 20 feet x 2.   Ambulation Distance (Feet): 40 Feet Assistive device: Rolling walker Gait Pattern: Step-through pattern;Decreased step length - right;Decreased step length - left;Decreased stride length;Shuffle;Trunk flexed (trunk flexes as pt fatigues.) Stairs: No Wheelchair Mobility Wheelchair Mobility: No  Posture/Postural Control Posture/Postural Control: Postural limitations Postural Limitations: significantly flexed posture Balance Balance Assessed: No Static Sitting Balance Static Sitting - Balance Support: No upper extremity supported;Feet supported Static Sitting - Level of Assistance: 6: Modified independent (Device/Increase time) Static Sitting - Comment/# of Minutes: 10 minutes   End of Session PT - End of Session Equipment Utilized During Treatment: Gait belt Activity Tolerance: Patient tolerated treatment well Patient left: in chair;with call bell in reach Nurse Communication: Mobility status for transfers;Mobility status for ambulation General Behavior During Session: [facility] for tasks performed Cognition: [facility] for tasks performed  INGOLD,Keante Urizar 10/18/2011, 1:02 PM [facility] Acute Rehabilitation [phone] (pager)

## 2011-10-19 DIAGNOSIS — H8110 Benign paroxysmal vertigo, unspecified ear: Secondary | ICD-10-CM | POA: Diagnosis present

## 2011-10-19 LAB — BASIC METABOLIC PANEL
Calcium: 9.7 mg/dL (ref 8.4–10.5)
Creatinine, Ser: 1.23 mg/dL — ABNORMAL HIGH (ref 0.50–1.10)
GFR calc non Af Amer: 39 mL/min — ABNORMAL LOW (ref >90)
Sodium: 136 mEq/L (ref 135–145)

## 2011-10-19 LAB — GLUCOSE, CAPILLARY
Glucose-Capillary: 225 mg/dL — ABNORMAL HIGH (ref 70–99)
Glucose-Capillary: 228 mg/dL — ABNORMAL HIGH (ref 70–99)

## 2011-10-19 LAB — PROTIME-INR
INR: 2.51 — ABNORMAL HIGH (ref 0.00–1.49)
Prothrombin Time: 27.5 seconds — ABNORMAL HIGH (ref 11.6–15.2)

## 2011-10-19 MED ORDER — INSULIN ASPART 100 UNIT/ML ~~LOC~~ SOLN: 3.0000 [IU] | Freq: Three times a day (TID) | SUBCUTANEOUS | Status: DC

## 2011-10-19 MED ORDER — FUROSEMIDE 40 MG PO TABS: 40.0000 mg | ORAL_TABLET | Freq: Two times a day (BID) | ORAL | Status: DC

## 2011-10-19 MED ORDER — INSULIN DETEMIR 100 UNIT/ML ~~LOC~~ SOLN
64.0000 [IU] | SUBCUTANEOUS | Status: DC
  Administered 2011-10-20: 64 [IU] via SUBCUTANEOUS

## 2011-10-19 NOTE — Progress Notes (Signed)
ANTICOAGULATION CONSULT NOTE - Follow Up Consult  Pharmacy Consult for  Coumadin Indication: atrial fibrillation  Allergies  Allergen Reactions  . Lantus     Itching   . Latex     Rash   . Metformin And Related     Severe diarrhea   . Metoclopramide Hcl     REACTION: unspecified  . Nsaids     REACTION: unspecified  . Promethazine Hcl     REACTION: unspecified    Patient Measurements: Height: 5\' 5"  (165.1 cm) Weight: 238 lb 6.4 oz (108.138 kg) IBW/kg (Calculated) : 57   Vital Signs: Temp: 97.3 F (36.3 C) (03/20 0500) Temp src: Oral (03/20 0500) BP: 143/72 mmHg (03/20 0500) Pulse Rate: 87  (03/20 0500)  Labs:  Alvira Philips 10/19/11 0624 10/18/11 0525 10/17/11 0650  HGB -- -- --  HCT -- -- --  PLT -- -- --  APTT -- -- --  LABPROT 27.5* 24.7* 25.3*  INR 2.51* 2.19* 2.26*  HEPARINUNFRC -- -- --  CREATININE 1.23* 1.29* 1.14*  CKTOTAL -- -- --  CKMB -- -- --  TROPONINI -- -- --   Estimated Creatinine Clearance: 40.1 ml/min (by C-G formula based on Cr of 1.23).  Assessment:88 YOF with Afib admitted 10/11/11 for increasing weakness and lethargy. Admit INR 1.47. Home regimen was 2.5 mg daily. INR stable today at 2.51  Infectious Disease: Afebrile. WBC 5. Sepsis due to influenza B and UTI. CTX MD#9 for bronchitis & UTI (yeast in urine), Fluconazole #5, s/p Tamiflu  for +influenza B,  Cardiovascular: hx Afib/HTN/HLD/CHF:VSS. On po furosemide, lopressor, (indapamide PTA). K replaced to 4.5 today. 2D Echo =EF 45-50%.   Endocrinology: hx DM - CBGs [phone], on SSI + meal coverage + Levemir 60/day, Hypothyroidism on Synthroid - TSH ok  Gastrointestinal / Nutrition: hx GERD/IBS - carb modified diet, Protonix  Nephrology: SCr 1.23  Pulmonary: hx asthma/COPD, wheezing with fluid overload - guaifenesin, tussionex, Flovent, Atrovent, Xopenex, taper Prednisone.  Hematology / Oncology: CBC ok 3/16  Goal of Therapy:  INR 2-3   Plan:  Continue Coumadin 2.5mg   daily  Pasty Spillers, PharmD 10/19/2011,9:45 AM

## 2011-10-19 NOTE — Progress Notes (Signed)
Physical Therapy Treatment Patient Details Name: [name] MRN: [id number] DOB: [birth year] Today's Date: 10/19/2011  PT Assessment/Plan  PT - Assessment/Plan Comments on Treatment Session: Pt progressing well with ambulation PT Plan: Discharge plan remains appropriate PT Frequency: Min 3X/week Follow Up Recommendations: Home health PT;Supervision/Assistance - 24 hour Equipment Recommended: None recommended by PT PT Goals  Acute Rehab PT Goals PT Goal: Sit to Stand - Progress: Progressing toward goal PT Transfer Goal: Bed to Chair/Chair to Bed - Progress: Progressing toward goal PT Goal: Ambulate - Progress: Progressing toward goal  PT Treatment Precautions/Restrictions  Precautions Precautions: Fall Required Braces or Orthoses: No Restrictions Weight Bearing Restrictions: No Mobility (including Balance) Bed Mobility Sit to Supine: 6: Modified independent (Device/Increase time);With rail Scooting to [facility]: 6: Modified independent (Device/Increase time);With rail Transfers Transfers: Yes Sit to Stand: 5: Supervision;With upper extremity assist;With armrests;From chair/3-in-1 Stand to Sit: To bed;5: Supervision;With upper extremity assist Stand to Sit Details: Cues for safe technique and positioning.  Ambulation/Gait Ambulation/Gait: Yes Ambulation/Gait Assistance: 4: Min assist;Other (comment) (MinGuard A) Ambulation/Gait Assistance Details (indicate cue type and reason): Cues for positioning inside of RW. Cues for upright posture.  Ambulation Distance (Feet): 80 Feet Assistive device: Rolling walker Gait Pattern: Decreased stride length;Step-through pattern;Trunk flexed    Exercise    End of Session PT - End of Session Equipment Utilized During Treatment: Gait belt Activity Tolerance: Patient tolerated treatment well Patient left: in bed Nurse Communication: Mobility status for transfers;Mobility status for ambulation General Behavior During Session: [facility] for  tasks performed Cognition: [facility] for tasks performed  Iya Hamed, Adline Potter 10/19/2011, 12:12 PM 10/19/2011 Fredrich Birks PTA [phone] pager [phone] office

## 2011-10-19 NOTE — Progress Notes (Signed)
Subjective: Patient complains of dizziness. She states that she sometimes has dizziness at home and takes meclizine for it. Objective: Filed Vitals:   10/18/11 2100 10/18/11 2144 10/19/11 0500 10/19/11 0750  BP: 116/70  143/72   Pulse: 91 70 87   Temp: 97.3 F (36.3 C)  97.3 F (36.3 C)   TempSrc: Oral  Oral   Resp: 18 18 18    Height:      Weight:   108.138 kg (238 lb 6.4 oz)   SpO2: 92% 95% 94% 94%   Weight change: -2.062 kg (-4 lb 8.8 oz) No intake or output data in the 24 hours ending 10/19/11 0910  General: Alert, awake, oriented x3, in no acute distress.  HEENT: Elliott/AT PEERL, EOMI Neck: Trachea midline,  no masses, no thyromegal,y no JVD, no carotid bruit OROPHARYNX:  Moist, No exudate/ erythema/lesions.  Heart: Regular rate and rhythm, without murmurs, rubs, gallops, PMI non-displaced, no heaves or thrills on palpation.  Lungs: Clear to auscultation, no wheezing or rhonchi noted. No increased vocal fremitus resonant to percussion  Abdomen: Soft, nontender, nondistended, positive bowel sounds, no masses no hepatosplenomegaly noted..  Neuro: No focal neurological deficits noted cranial nerves II through XII grossly intact. DTRs 2+ bilaterally upper and lower extremities. Strength functional in bilateral upper and lower extremities. Musculoskeletal: No warm swelling or erythema around joints, no spinal tenderness noted. Psychiatric: Patient alert and oriented x3, good insight and cognition, good recent to remote recall.      Lab Results:  Basename 10/19/11 0624 10/18/11 0525  NA 136 134*  K 4.5 4.9  CL 94* 92*  CO2 26 27  GLUCOSE 193* 339*  BUN 44* 37*  CREATININE 1.23* 1.29*  CALCIUM 9.7 9.0  MG -- --  PHOS -- --   No results found for this basename: AST:2,ALT:2,ALKPHOS:2,BILITOT:2,PROT:2,ALBUMIN:2 in the last 72 hours No results found for this basename: LIPASE:2,AMYLASE:2 in the last 72 hours No results found for this basename:  WBC:2,NEUTROABS:2,HGB:2,HCT:2,MCV:2,PLT:2 in the last 72 hours No results found for this basename: CKTOTAL:3,CKMB:3,CKMBINDEX:3,TROPONINI:3 in the last 72 hours No components found with this basename: POCBNP:3 No results found for this basename: DDIMER:2 in the last 72 hours No results found for this basename: HGBA1C:2 in the last 72 hours No results found for this basename: CHOL:2,HDL:2,LDLCALC:2,TRIG:2,CHOLHDL:2,LDLDIRECT:2 in the last 72 hours No results found for this basename: TSH,T4TOTAL,FREET3,T3FREE,THYROIDAB in the last 72 hours No results found for this basename: VITAMINB12:2,FOLATE:2,FERRITIN:2,TIBC:2,IRON:2,RETICCTPCT:2 in the last 72 hours  Micro Results: Recent Results (from the past 240 hour(s))  CULTURE, BLOOD (ROUTINE X 2)     Status: Normal   Collection Time   10/11/11  4:20 PM      Component Value Range Status Comment   Specimen Description BLOOD ARM LEFT   Final    Special Requests     Final    Value: BOTTLES DRAWN AEROBIC AND ANAEROBIC 10CC AER 5CC ANA   Culture  Setup Time [id number]   Final    Culture NO GROWTH 5 DAYS   Final    Report Status 10/18/2011 FINAL   Final   CULTURE, BLOOD (ROUTINE X 2)     Status: Normal   Collection Time   10/11/11  4:55 PM      Component Value Range Status Comment   Specimen Description BLOOD LEFT ARM   Final    Special Requests     Final    Value: BOTTLES DRAWN AEROBIC AND ANAEROBIC BLUE 10CC RED 5CC   Culture  Setup Time  [id number]   Final    Culture NO GROWTH 5 DAYS   Final    Report Status 10/18/2011 FINAL   Final   URINE CULTURE     Status: Normal   Collection Time   10/13/11  2:40 PM      Component Value Range Status Comment   Specimen Description URINE, CATHETERIZED   Final    Special Requests NONE   Final    Culture  Setup Time [id number]   Final    Colony Count 80,000 COLONIES/ML   Final    Culture YEAST   Final    Report Status 10/15/2011 FINAL   Final     Studies/Results: Dg Chest 2 View  10/15/2011   *RADIOLOGY REPORT*  Clinical Data: Shortness of breath, wheezing  CHEST - 2 VIEW  Comparison: October 11, 2011.  Findings: Cardiomediastinal silhouette appears normal.  No acute pulmonary disease is noted.  IMPRESSION: No acute cardiopulmonary abnormality seen.  Original Report Authenticated By: Venita Sheffield., M.D.   Ct Head Wo Contrast  10/11/2011  *RADIOLOGY REPORT*  Clinical Data: Confusion and altered level of consciousness.  CT HEAD WITHOUT CONTRAST  Technique:  Contiguous axial images were obtained from the base of the skull through the vertex without contrast.  Comparison: 12/23/2009  Findings: Stable advanced small vessel ischemic changes in the periventricular white matter.  Stable cortical atrophy. The brain demonstrates no evidence of hemorrhage, infarction, edema, mass effect, extra-axial fluid collection, hydrocephalus or mass lesion. The skull is unremarkable.  IMPRESSION: Stable atrophy and small vessel disease.  Original Report Authenticated By: Reola Calkins, M.D.   Dg Chest Port 1 View  10/11/2011  *RADIOLOGY REPORT*  Clinical Data: Chest pain.  Bilateral breast pains.  History of atrial fibrillation, diabetes, hypertension.  Fever.  PORTABLE CHEST - 1 VIEW  Comparison: 02/26/2010  Findings: The heart is enlarged.  There are perihilar bronchitic changes.  No focal consolidations or pleural effusions are identified.  No edema.  IMPRESSION: Cardiomegaly.  Bronchitic changes.  Original Report Authenticated By: Patterson Hammersmith, M.D.    Medications: I have reviewed the patient's current medications. Scheduled Meds:   . antiseptic oral rinse  15 mL Mouth Rinse BID  . chlorpheniramine-HYDROcodone  5 mL Oral Q12H  . fluconazole  100 mg Oral Daily  . fluticasone  2 puff Inhalation BID  . furosemide  40 mg Oral BID  . guaiFENesin  600 mg Oral BID  . insulin aspart  0-15 Units Subcutaneous TID WC  . insulin aspart  0-5 Units Subcutaneous QHS  . insulin aspart  3 Units  Subcutaneous TID WC  . insulin detemir  60 Units Subcutaneous BH-q7a  . ipratropium  0.5 mg Nebulization Q4H  . levalbuterol  0.63 mg Nebulization Q4H  . levothyroxine  150 mcg Oral Daily  . metoprolol  25 mg Oral BID  . pantoprazole  40 mg Oral Q1200  . sodium chloride  3 mL Intravenous Q12H  . warfarin  2.5 mg Oral q1800  . Warfarin - Pharmacist Dosing Inpatient   Does not apply q1800  . DISCONTD: cefTRIAXone (ROCEPHIN)  IV  1 g Intravenous Q24H  . DISCONTD: potassium chloride  40 mEq Oral Daily  . DISCONTD: predniSONE  40 mg Oral Q breakfast   Continuous Infusions:  PRN Meds:.acetaminophen, acetaminophen, bisacodyl, meclizine, methocarbamol, ondansetron (ZOFRAN) IV, ondansetron, traMADol Assessment/Plan: Patient Active Hospital Problem List: Sepsis (10/12/2011)   Assessment: Resolved     HYPOTHYROIDISM (01/12/2007)   Assessment: Adequately supplemented  DIABETES MELLITUS, TYPE II (01/12/2007)   Assessment: Blood sugars well-controlled continue current regimen    HYPERTENSION (01/12/2007)   Assessment: Blood pressure marginal we'll check orthostatics    Atrial fibrillation (01/12/2007)   Assessment: Heart rate marginal towards elevation     Pain of breast (10/04/2011)   Assessment: No complaints of pain at this time    Generalized weakness (10/11/2011)   Assessment: Home health services    UTI (lower urinary tract infection) (10/11/2011)   Assessment: Patient only had 8000 colonies of yeast I will DC the Diflucan.     Influenza B (10/12/2011)   Assessment: Patient fully treated.     Dizziness (10/19/2011)   Assessment: Patient not complaining of dizziness. We'll check orthostatics on the patient and restart her meclizine. I will assess the patient's response to the meclizine and make a decision about discharge home to   LOS: 8 days

## 2011-10-20 LAB — PROTIME-INR
INR: 2.72 — ABNORMAL HIGH (ref 0.00–1.49)
Prothrombin Time: 29.3 seconds — ABNORMAL HIGH (ref 11.6–15.2)

## 2011-10-20 LAB — GLUCOSE, CAPILLARY: Glucose-Capillary: 81 mg/dL (ref 70–99)

## 2011-10-20 MED ORDER — INSULIN DETEMIR 100 UNIT/ML ~~LOC~~ SOLN: 64.0000 [IU] | SUBCUTANEOUS | Status: DC

## 2011-10-20 NOTE — Progress Notes (Signed)
ANTICOAGULATION CONSULT NOTE - Follow Up Consult  Pharmacy Consult for  Coumadin Indication: atrial fibrillation  Allergies  Allergen Reactions  . Lantus     Itching   . Latex     Rash   . Metformin And Related     Severe diarrhea   . Metoclopramide Hcl     REACTION: unspecified  . Nsaids     REACTION: unspecified  . Promethazine Hcl     REACTION: unspecified    Patient Measurements: Height: 5\' 5"  (165.1 cm) Weight: 237 lb 8 oz (107.729 kg) IBW/kg (Calculated) : 57   Vital Signs: Temp: 97.7 F (36.5 C) (03/21 0500) Temp src: Oral (03/21 0500) BP: 121/63 mmHg (03/21 1110) Pulse Rate: 106  (03/21 1110)  Labs:  Basename 10/20/11 0505 10/19/11 0624 10/18/11 0525  HGB -- -- --  HCT -- -- --  PLT -- -- --  APTT -- -- --  LABPROT 29.3* 27.5* 24.7*  INR 2.72* 2.51* 2.19*  HEPARINUNFRC -- -- --  CREATININE -- 1.23* 1.29*  CKTOTAL -- -- --  CKMB -- -- --  TROPONINI -- -- --   Estimated Creatinine Clearance: 40.1 ml/min (by C-G formula based on Cr of 1.23).  Assessment:88 YOF with Afib admitted 10/11/11 for increasing weakness and lethargy. Admit INR 1.47. Home regimen was 2.5 mg daily. INR stable today at 2.72  Infectious Disease: Afebrile. WBC 5. Sepsis due to influenza B and UTI. Bronchitis & UTI (yeast in urine) treated with Rocephin and Fluconazole, also s/p Tamiflu  for +influenza B  Cardiovascular: hx Afib/HTN/HLD/CHF:VSS. On po furosemide, lopressor, (indapamide PTA). K replaced to 4.5 today. 2D Echo =EF 45-50%.   Endocrinology: hx DM - CBGs 81-228, on SSI + meal coverage + Levemir 60/day, Hypothyroidism on Synthroid - TSH ok  Gastrointestinal / Nutrition: hx GERD/IBS - carb modified diet, Protonix  Nephrology: SCr 1.23  Pulmonary: hx asthma/COPD, wheezing with fluid overload - guaifenesin, tussionex, Flovent, Atrovent, Xopenex, taper Prednisone.  Hematology / Oncology: CBC ok 3/16  Goal of Therapy:  INR 2-3   Plan:  Continue Coumadin 2.5mg   daily  Pasty Spillers, PharmD 10/20/2011,11:14 AM

## 2011-10-20 NOTE — Discharge Summary (Signed)
[name] MRN: [id number] DOB/AGE: 04-Aug-1922 78 y.o.  Admit date: 10/11/2011 Discharge date: 10/20/2011  Primary Care Physician:  [name], MD, MD   Discharge Diagnoses:   Patient Active Problem List  Diagnoses  . ADENOMATOUS COLONIC POLYP  . HYPOTHYROIDISM  . DIABETES MELLITUS, TYPE II  . HYPERLIPIDEMIA  . HYPERTENSION  . Atrial fibrillation  . CHRONIC RHINITIS  . GERD  . HIATAL HERNIA  . IRRITABLE BOWEL SYNDROME  . ROSACEA  . INTERTRIGO  . OSTEOPOROSIS  . UNSPECIFIED VITAMIN D DEFICIENCY  . Lump in the abdomen  . Fatigue  . B12 deficiency  . Pain of breast  . Dysphagia  . Generalized weakness  . Fever  . UTI (lower urinary tract infection)  . Sepsis  . Influenza B  . Benign positional vertigo    DISCHARGE MEDICATION: Medication List  As of 10/20/2011  2:11 PM   TAKE these medications         acetaminophen 500 MG tablet   Commonly known as: TYLENOL   Take 500 mg by mouth 2 (two) times daily.      ergocalciferol 50000 UNITS capsule   Commonly known as: VITAMIN D2   Take 50,000 Units by mouth once a week. wednesday      furosemide 40 MG tablet   Commonly known as: LASIX   Take 1 tablet (40 mg total) by mouth 2 (two) times daily.      indapamide 2.5 MG tablet   Commonly known as: LOZOL   Take 2.5 mg by mouth daily.      insulin aspart 100 UNIT/ML injection   Commonly known as: novoLOG   Inject 3 Units into the skin 3 (three) times daily with meals.      insulin detemir 100 UNIT/ML injection   Commonly known as: LEVEMIR   Inject 64 Units into the skin every morning.      levothyroxine 150 MCG tablet   Commonly known as: SYNTHROID, LEVOTHROID   Take 150 mcg by mouth daily.      loperamide 2 MG tablet   Commonly known as: IMODIUM A-D   Take 2 mg by mouth every morning.      meclizine 25 MG tablet   Commonly known as: ANTIVERT   Take 25 mg by mouth 3 (three) times daily as needed. For vertigo      methocarbamol 500 MG tablet   Commonly  known as: ROBAXIN   Take 500 mg by mouth 3 (three) times daily as needed. For muscle spasm      metoprolol 50 MG tablet   Commonly known as: LOPRESSOR   Take 0.5 tablets (25 mg total) by mouth 2 (two) times daily.      omeprazole 20 MG capsule   Commonly known as: PRILOSEC   Take 20 mg by mouth daily.      potassium chloride SA 20 MEQ tablet   Commonly known as: K-DUR,KLOR-CON   Take 20 mEq by mouth 2 (two) times daily.      traMADol 50 MG tablet   Commonly known as: ULTRAM   Take 50 mg by mouth 3 (three) times daily as needed. pain      warfarin 5 MG tablet   Commonly known as: COUMADIN   Take 2.5 mg by mouth every evening.              Consults:     SIGNIFICANT DIAGNOSTIC STUDIES:  Dg Chest 2 View  10/15/2011  *RADIOLOGY REPORT*  Clinical Data:  Shortness of breath, wheezing  CHEST - 2 VIEW  Comparison: October 11, 2011.  Findings: Cardiomediastinal silhouette appears normal.  No acute pulmonary disease is noted.  IMPRESSION: No acute cardiopulmonary abnormality seen.  Original Report Authenticated By: Venita Sheffield., M.D.   Ct Head Wo Contrast  10/11/2011  *RADIOLOGY REPORT*  Clinical Data: Confusion and altered level of consciousness.  CT HEAD WITHOUT CONTRAST  Technique:  Contiguous axial images were obtained from the base of the skull through the vertex without contrast.  Comparison: 12/23/2009  Findings: Stable advanced small vessel ischemic changes in the periventricular white matter.  Stable cortical atrophy. The brain demonstrates no evidence of hemorrhage, infarction, edema, mass effect, extra-axial fluid collection, hydrocephalus or mass lesion. The skull is unremarkable.  IMPRESSION: Stable atrophy and small vessel disease.  Original Report Authenticated By: Reola Calkins, M.D.   Dg Chest Port 1 View  10/11/2011  *RADIOLOGY REPORT*  Clinical Data: Chest pain.  Bilateral breast pains.  History of atrial fibrillation, diabetes, hypertension.  Fever.  PORTABLE  CHEST - 1 VIEW  Comparison: 02/26/2010  Findings: The heart is enlarged.  There are perihilar bronchitic changes.  No focal consolidations or pleural effusions are identified.  No edema.  IMPRESSION: Cardiomegaly.  Bronchitic changes.  Original Report Authenticated By: Patterson Hammersmith, M.D.    Recent Results (from the past 240 hour(s))  CULTURE, BLOOD (ROUTINE X 2)     Status: Normal   Collection Time   10/11/11  4:20 PM      Component Value Range Status Comment   Specimen Description BLOOD ARM LEFT   Final    Special Requests     Final    Value: BOTTLES DRAWN AEROBIC AND ANAEROBIC 10CC AER 5CC ANA   Culture  Setup Time [id number]   Final    Culture NO GROWTH 5 DAYS   Final    Report Status 10/18/2011 FINAL   Final   CULTURE, BLOOD (ROUTINE X 2)     Status: Normal   Collection Time   10/11/11  4:55 PM      Component Value Range Status Comment   Specimen Description BLOOD LEFT ARM   Final    Special Requests     Final    Value: BOTTLES DRAWN AEROBIC AND ANAEROBIC BLUE 10CC RED 5CC   Culture  Setup Time [id number]   Final    Culture NO GROWTH 5 DAYS   Final    Report Status 10/18/2011 FINAL   Final   URINE CULTURE     Status: Normal   Collection Time   10/13/11  2:40 PM      Component Value Range Status Comment   Specimen Description URINE, CATHETERIZED   Final    Special Requests NONE   Final    Culture  Setup Time [id number]   Final    Colony Count 80,000 COLONIES/ML   Final    Culture YEAST   Final    Report Status 10/15/2011 FINAL   Final     BRIEF ADMITTING H & P: 78 year-old female with history of diabetes mellitus type 2, atrial fibrillation on Coumadin, hypertension, hypothyroidism was brought to the ER by patient's son as patient was found to be increasingly weak and lethargic since morning. Patient has not not been eating well since last evening and has been having some cough. Patient usually walks minimal with help of a walker. Since patient's condition did not  improve patient was brought to the ER. In  the ER patient was found to be febrile with a UA compatible with UTI. Patient's husband also has been having cough for last few days. Patient denies any shortness of breath though patient does have some nonspecific chest pain which increases with cough. Patient did have one episode of nausea and vomiting today early in the morning but presently she has good appetite and wants to eat. Patient will be admitted for further observation and management    Hospital Course:  Present on Admission:  .Generalized weakness:Cause was multifactorial and felt to be due to a combination of factors including influenza, bronchitis and dehydration. Pt was treated with Tamiflu secondary to  positive influenza B. Clinically pt was also found to have acute bronchitis. Treatment was initiated with prednisone, however pt refused stating that prednisone caused too much fluid retention even in small doses. Pt was unwilling to attempt medrol as a substitute. Pt still has some malaise but has no increased oxygen requirement at this time. She is anxious to get home and recuperate in her own home. HH PT will see patient at home.  .Fever: Felt to be secondary to Influenza. Now resolved.  Marland KitchenUTI (lower urinary tract infection):Pt  had a U/A suggestive of UTI and was initially started on Rocephin until cultures were resulted. Pt. Cultures grew 80, 000 colonies of yeast. Pt was treated with Diflucan for a total of 5 days.   Marland KitchenDIABETES MELLITUS, TYPE II: Levemer increased to 64 units daily.  .Atrial fibrillation: HR controlled and pt theraputic on Coumadin. INR to be checked on Monday 10/24/2011  .HYPOTHYROIDISM: adequately substituted    .HYPERTENSION: BP well controlled on current medication  .Sepsis: Felt to be secondary to Influenza and UTI. Resolved.  .Benign positional vertigo: continue meclizine  Disposition and Follow-up:  Follow up with Dr. Milinda Antis on Monday 10/24/2011 to have INR  evaluated.   Discharge Orders    Future Appointments: Provider: Department: Dept Phone: Center:   11/01/2011 2:15 PM [facility] [phone] [id number]     Future Orders Please Complete By Expires   Diet Carb Modified      Comments:   Heart Healthy   Increase activity slowly         DISCHARGE EXAM:  General: Alert, awake, oriented x3, in no acute distress.  Vital Signs:Blood pressure 108/63, pulse 106, temperature 97.7 F (36.5 C), temperature source Oral, resp. rate 18, height 5\' 5"  (1.651 m), weight 107.729 kg (237 lb 8 oz), last menstrual period 09/01/1961, SpO2 97.00%. HEENT: [place]/AT PEERL, EOMI  Neck: Trachea midline, no masses, no thyromegal,y no JVD, no carotid bruit  OROPHARYNX: Moist, No exudate/ erythema/lesions.  Heart: Regular rate and rhythm, without murmurs, rubs, gallops, PMI non-displaced, no heaves or thrills on palpation.  Lungs: Clear to auscultation, no wheezing or rhonchi noted. No increased vocal fremitus resonant to percussion  Abdomen: Soft, nontender, nondistended, positive bowel sounds, no masses no hepatosplenomegaly noted..  Neuro: No focal neurological deficits noted cranial nerves II through XII grossly intact. DTRs 2+ bilaterally upper and lower extremities. Strength functional in bilateral upper and lower extremities.  Musculoskeletal: No warm swelling or erythema around joints, no spinal tenderness noted.  Psychiatric: Patient alert and oriented x3, good insight and cognition, good recent to remote recall.    Basename 10/19/11 0624 10/18/11 0525  NA 136 134*  K 4.5 4.9  CL 94* 92*  CO2 26 27  GLUCOSE 193* 339*  BUN 44* 37*  CREATININE 1.23* 1.29*  CALCIUM 9.7 9.0  MG -- --  PHOS -- --   No results found for this basename: AST:2,ALT:2,ALKPHOS:2,BILITOT:2,PROT:2,ALBUMIN:2 in the last 72 hours No results found for this basename: LIPASE:2,AMYLASE:2 in the last 72 hours No results found for this basename:  WBC:2,NEUTROABS:2,HGB:2,HCT:2,MCV:2,PLT:2 in the last 72 hours  Time spent in discharge process including face to face time approximately 40 minutes. Signed: Geni Skorupski A. 10/20/2011, 2:11 PM

## 2011-10-20 NOTE — Progress Notes (Signed)
Pt's AM CBG 81.  MD notified and instructed RN to go ahead with administering Levemir and Novolog.  Will continue to monitor. Nolon Nations

## 2011-10-20 NOTE — Progress Notes (Addendum)
DC orders received.  Patient stable with no S/S of distress.  Discharge and medication information reviewed with patient and husband.  Patient DC home with husband.  Patient and spouse also counseled on importance of obtaining daily weights to prevent fluid volume overload.

## 2011-10-24 ENCOUNTER — Telehealth: Payer: Self-pay

## 2011-10-24 NOTE — Telephone Encounter (Signed)
Spoke with Dr. Milinda Antis regarding patient as stated below, Dr. Milinda Antis would like patient to hold Coumadin for two days (Monday and Tuesday) and recheck again on Wednesday.  Called and left a voicemail on cell phone for Robin.

## 2011-10-24 NOTE — Telephone Encounter (Signed)
Robin with [facility] called INR 4.1, PT 48.8 and BS 215 (pt has been drinking OJ so not sure accuracy of BS) Pt taking Coumadin 2.5 mg daily Please call Robin with [facility] re to instructions and when to ck next INR

## 2011-10-25 ENCOUNTER — Telehealth: Payer: Self-pay

## 2011-10-25 NOTE — Telephone Encounter (Signed)
There is not much I can do without seeing her - the appetite and taste problem and unsteadiness and pain have been with her for a while - but I suspect worse after hospitalization Make sure she is getting enough fluids I am hesitant to inc any pain med since that would increase her unsteady and dizziness (which the tramadol/ ultram can already do )  If she thinks she is generally worsening - ie: getting another infection/ fever etc- she needs to call ems to transport her to ER (or if mental status change) Otherwise f/u with me as planned

## 2011-10-25 NOTE — Telephone Encounter (Signed)
[name] called back earlier today to let me know that she did get my message and she instructed patient to hold Coumadin last night and tonight and recheck on Wednesday.

## 2011-10-25 NOTE — Telephone Encounter (Signed)
Pt said she is dizzy, unsteady in walking, no appetite, all food taste terrible, can't sleep and pain in rt ankle that Tramadol lis not helping. Pt said she is not able to come in to be seen at least for couple more days. Pt uses [facility] and can be reached at [phone].

## 2011-10-25 NOTE — Telephone Encounter (Signed)
Left message on cell phone voicemail for [name] to return call.

## 2011-10-25 NOTE — Telephone Encounter (Signed)
Patient advised as instructed via telephone.  She stated that she "just feels bad."  She stated that she is very weak and has no energy.  She stated that she can just barely get up from her chair, walk across the room and go to the kitchen table.  I advised patient that she needs to stay hydrated with fluids even if she doesn't have an appetite.  She stated that "it's hard to do."  I asked patient if she felt bad enough to call 9-1-1 or go the the ER and she stated "not yet."  I advised patient that if her symptoms changed or worsened then she needs to call 9-1-1 and be taken to the ER.  Patient stated that she understood.  She will call our office to f/u with Dr. Milinda Antis.

## 2011-10-26 ENCOUNTER — Telehealth: Payer: Self-pay | Admitting: *Deleted

## 2011-10-26 NOTE — Telephone Encounter (Signed)
Please confirm her coumadin dose - I think she has been holding for several days ? -- let me know

## 2011-10-26 NOTE — Telephone Encounter (Signed)
PT 47.8 INR 4.0 fasting blood sugar 116.  Please advise.

## 2011-10-26 NOTE — Telephone Encounter (Signed)
Patient held Coumadin for Monday and Tuesday, INR rechecked today.  Patient was taking 5mg  daily.  Please advise.

## 2011-10-26 NOTE — Telephone Encounter (Signed)
Spoke with Consuella Lose and advised as instructed via telephone.

## 2011-10-26 NOTE — Telephone Encounter (Signed)
I am surprised it did not come down more (was 4.1 on 3/25 and then held 2 days)- please confirm she did in fact hold it  Please advise her to hold coumadin today (wed) tomorrow (thurs) and then resume 5 mg daily and have home nurse check it when able -- Monday or sooner  If any bleeding or other new changes alert me, thanks

## 2011-10-31 ENCOUNTER — Telehealth: Payer: Self-pay | Admitting: *Deleted

## 2011-10-31 NOTE — Telephone Encounter (Signed)
PT 33.7 INR 2.8 patient has been on 5mg  daily.  Melody request that we call patient with any changes/updates at [phone].  Please advise.

## 2011-10-31 NOTE — Telephone Encounter (Signed)
Ok- thanks for fixing that on med list Continue that dose and re check INR at end of the week

## 2011-10-31 NOTE — Telephone Encounter (Signed)
Continue 5 mg daily and re check INR please at the end of the week-thanks

## 2011-10-31 NOTE — Telephone Encounter (Signed)
Patient and Melody advised via telephone, patient will have INR rechecked on Thursday or Friday Dr. Milinda Antis said that either day was fine.

## 2011-10-31 NOTE — Telephone Encounter (Signed)
Spoke with patient and she stated that she is taking 2.5mg  daily and has been for some time now.

## 2011-11-01 ENCOUNTER — Ambulatory Visit: Payer: Medicare Other

## 2011-11-03 ENCOUNTER — Telehealth: Payer: Self-pay | Admitting: Family Medicine

## 2011-11-03 NOTE — Telephone Encounter (Signed)
That is her choice but I think it would help her be more independent and mobile

## 2011-11-03 NOTE — Telephone Encounter (Signed)
Physical Therapist is calling regarding pt. She has refused Physical Therapy and is saying her husband is helping her.   Physical Therapist number: [phone] Bonita Quin

## 2011-11-03 NOTE — Telephone Encounter (Signed)
Spoke with patient and advised as instructed via telephone, she stated that she is willing to give PT a try.  [name] advised as instructed via telephone.

## 2011-11-04 ENCOUNTER — Telehealth: Payer: Self-pay | Admitting: *Deleted

## 2011-11-04 NOTE — Telephone Encounter (Signed)
PT 48.3 INR 4.0 patient taking 2.5mg  daily.  Please advise.

## 2011-11-04 NOTE — Telephone Encounter (Signed)
Patient and Melody advised as instructed via telephone, Melody wanted Dr. Milinda Antis to know that she will add a few more visits to the patient.  Patient did report two nose bleeds this week but Melody stated that they were small nose bleeds.  Melody also stated that she will encourage patient to come to the office for PT/INR draws after Monday and maybe she will follow up with Dr. Milinda Antis.

## 2011-11-04 NOTE — Telephone Encounter (Signed)
Hold today and tomorrow (fri and sat) , 2.5 mg on Sunday, then re check INR on Monday please Update if bleeding , etc thanks

## 2011-11-04 NOTE — Telephone Encounter (Signed)
If possible - go ahead and schedule f/u with me after home care ends-thanks

## 2011-11-06 DIAGNOSIS — E1165 Type 2 diabetes mellitus with hyperglycemia: Secondary | ICD-10-CM

## 2011-11-06 DIAGNOSIS — N390 Urinary tract infection, site not specified: Secondary | ICD-10-CM

## 2011-11-06 DIAGNOSIS — J111 Influenza due to unidentified influenza virus with other respiratory manifestations: Secondary | ICD-10-CM

## 2011-11-06 DIAGNOSIS — I4891 Unspecified atrial fibrillation: Secondary | ICD-10-CM

## 2011-11-07 NOTE — Telephone Encounter (Signed)
Patient advised as instructed via telephone, she doesn't know when her home care ends but will contact us to schedule appt at that time.

## 2011-11-09 ENCOUNTER — Telehealth: Payer: Self-pay | Admitting: *Deleted

## 2011-11-09 NOTE — Telephone Encounter (Signed)
Patient advised as instructed via telephone, patient wanted to see Dr. Milinda Antis and have PT/INR checked at the same time.  Dr. Milinda Antis advised that patient could come on Monday for f/u appt but if not patient would have to have PT/INR checked on 11/11/2011.  Patient scheduled to see Dr. Milinda Antis 11/14/2011 at 2:15.

## 2011-11-09 NOTE — Telephone Encounter (Signed)
Please continue this dose and re check on Friday

## 2011-11-09 NOTE — Telephone Encounter (Signed)
PT 31.0 INR 2.6 patient is taking 2.5mg  of Coumadin daily.

## 2011-11-14 ENCOUNTER — Ambulatory Visit (INDEPENDENT_AMBULATORY_CARE_PROVIDER_SITE_OTHER): Payer: Medicare Other | Admitting: Family Medicine

## 2011-11-14 ENCOUNTER — Encounter: Payer: Self-pay | Admitting: Family Medicine

## 2011-11-14 VITALS — BP 130/82 | HR 85 | Temp 97.8°F

## 2011-11-14 DIAGNOSIS — E559 Vitamin D deficiency, unspecified: Secondary | ICD-10-CM

## 2011-11-14 DIAGNOSIS — Z5181 Encounter for therapeutic drug level monitoring: Secondary | ICD-10-CM

## 2011-11-14 DIAGNOSIS — Z7901 Long term (current) use of anticoagulants: Secondary | ICD-10-CM

## 2011-11-14 DIAGNOSIS — I4891 Unspecified atrial fibrillation: Secondary | ICD-10-CM

## 2011-11-14 DIAGNOSIS — N390 Urinary tract infection, site not specified: Secondary | ICD-10-CM

## 2011-11-14 DIAGNOSIS — J111 Influenza due to unidentified influenza virus with other respiratory manifestations: Secondary | ICD-10-CM

## 2011-11-14 MED ORDER — INSULIN DETEMIR 100 UNIT/ML ~~LOC~~ SOLN: 64.0000 [IU] | SUBCUTANEOUS | Status: DC

## 2011-11-14 MED ORDER — METHOCARBAMOL 500 MG PO TABS: 500.0000 mg | ORAL_TABLET | Freq: Three times a day (TID) | ORAL | Status: DC | PRN

## 2011-11-14 MED ORDER — TRAMADOL HCL 50 MG PO TABS: 50.0000 mg | ORAL_TABLET | Freq: Three times a day (TID) | ORAL | Status: DC | PRN

## 2011-11-14 MED ORDER — GLUCOSE BLOOD VI STRP: ORAL_STRIP | Status: AC

## 2011-11-14 MED ORDER — FUROSEMIDE 40 MG PO TABS: 40.0000 mg | ORAL_TABLET | Freq: Every day | ORAL | Status: DC

## 2011-11-14 NOTE — Patient Instructions (Signed)
Patient is recovering after illness, appetite has returned, check in 2 weeks with no dose changes

## 2011-11-14 NOTE — Assessment & Plan Note (Signed)
Pt had yeast uti in hospital  Doing much better now after tx with diflucan

## 2011-11-14 NOTE — Assessment & Plan Note (Addendum)
Revolved after tx in hosp with tamiflu  Rev hospital notes and studies that are available in detail today - with pt  Feels good today - and re assuring exam Adv to update if cough or other symptoms return Needs rehab but declines it - unfortunately chronic pain and weakness keep her sedentary

## 2011-11-14 NOTE — Assessment & Plan Note (Signed)
Pt done with high dose tx  Level today

## 2011-11-14 NOTE — Progress Notes (Signed)
Subjective:    Patient ID: [name], female    DOB: [birth year], 78 y.o.   MRN: [id number]  HPI Here for f/u of hosp - had uti that was yeast - tx with diflucan and also bronchitis thought to be caused by flu Was tx with tamiflu Released to home  She declined prednisone in the hospital  Had home nurse to do protimes for a while  Yesterday went to church for the first time in years   No urine symptoms  No longer coughing   Is in chronic pain  Tailbone hurts after falling  Taking her regular pain pills    Main complaint going in was weakness  Is still quite weak Tends to decline PT - is very sedentary  Was d/c on weekly vitamin D Also levimir- sees Dr Everardo All for her DM  Sugar has been ok - was 136 this am -- has to be careful with diet   Patient Active Problem List  Diagnoses  . ADENOMATOUS COLONIC POLYP  . HYPOTHYROIDISM  . DIABETES MELLITUS, TYPE II  . HYPERLIPIDEMIA  . HYPERTENSION  . Atrial fibrillation  . CHRONIC RHINITIS  . GERD  . HIATAL HERNIA  . IRRITABLE BOWEL SYNDROME  . ROSACEA  . INTERTRIGO  . OSTEOPOROSIS  . UNSPECIFIED VITAMIN D DEFICIENCY  . Lump in the abdomen  . Fatigue  . B12 deficiency  . Pain of breast  . Dysphagia  . Generalized weakness  . Fever  . UTI (lower urinary tract infection)  . Sepsis  . Influenza B  . Benign positional vertigo  . Bronchitis with flu   Past Medical History  Diagnosis Date  . Atrial fibrillation   . Diabetes mellitus     type II  . Anemia     Fe deficiency  . GERD (gastroesophageal reflux disease)   . Hyperlipidemia   . Hypertension   . Hypothyroid   . Obesity   . Vaginal dysplasia 1980  . Rosacea   . Chest pain 11/2002    cardiac cath neg   . Back pain   . IBS (irritable bowel syndrome)     with diarrhea  . Asthma     as a child  . Adenomatous colon polyp 12/2007  . Hemorrhoids   . Vertigo   . Hiatal hernia 12/2007    EGD   Past Surgical History  Procedure Date  . Abdominal  hysterectomy 1963  . Cholecystectomy 2006  . Knee surgery     Rt TKR 1993 & Lt TKR 2001  . Cervical cone biopsy 1963    D&C  . Appendectomy 1941  . Cystocele repair 1978    /rectocele  . Orif ankle fracture 01/2010    fall/followed by rehab stay  . Cataract extraction 2006    bilateral   History  Substance Use Topics  . Smoking status: Former Games developer  . Smokeless tobacco: Never Used  . Alcohol Use: No   Family History  Problem Relation Age of Onset  . Cancer Mother     leukemia  . Coronary artery disease Mother   . Dementia Mother   . Heart disease Father     CAD  . COPD Sister   . Heart disease Brother   . Diabetes Brother   . Kidney disease Son   . Colon cancer Brother    Allergies  Allergen Reactions  . Lantus     Itching   . Latex     Rash   .  Metformin And Related     Severe diarrhea   . Metoclopramide Hcl     REACTION: unspecified  . Nsaids     REACTION: unspecified  . Promethazine Hcl     REACTION: unspecified   Current Outpatient Prescriptions on File Prior to Visit  Medication Sig Dispense Refill  . acetaminophen (TYLENOL) 500 MG tablet Take 500 mg by mouth 2 (two) times daily.       . furosemide (LASIX) 40 MG tablet Take 1 tablet (40 mg total) by mouth daily.  90 tablet  3  . indapamide (LOZOL) 2.5 MG tablet Take 2.5 mg by mouth daily.      . insulin aspart (NOVOLOG) 100 UNIT/ML injection Inject 3 Units into the skin 3 (three) times daily with meals.  1 vial  0  . insulin detemir (LEVEMIR) 100 UNIT/ML injection Inject 64 Units into the skin every morning.  10 mL  5  . levothyroxine (SYNTHROID, LEVOTHROID) 150 MCG tablet Take 150 mcg by mouth daily.      Marland Kitchen loperamide (IMODIUM A-D) 2 MG tablet Take 2 mg by mouth every morning.      . meclizine (ANTIVERT) 25 MG tablet Take 25 mg by mouth 3 (three) times daily as needed. For vertigo      . metoprolol (LOPRESSOR) 50 MG tablet Take 0.5 tablets (25 mg total) by mouth 2 (two) times daily.  90 tablet  3    . omeprazole (PRILOSEC) 20 MG capsule Take 20 mg by mouth daily.      . potassium chloride SA (K-DUR,KLOR-CON) 20 MEQ tablet Take 20 mEq by mouth 2 (two) times daily.      Marland Kitchen warfarin (COUMADIN) 5 MG tablet Take 2.5 mg by mouth every evening.      Marland Kitchen DISCONTD: simvastatin (ZOCOR) 40 MG tablet Take 1 tablet (40 mg total) by mouth at bedtime.  90 tablet  3         Review of Systems Review of Systems  Constitutional: Negative for fever, appetite change, fatigue and unexpected weight change.  Eyes: Negative for pain and visual disturbance.  Respiratory: Negative for cough and shortness of breath.  (cough is resolved now) Cardiovascular: Negative for cp or palpitations    Gastrointestinal: Negative for nausea, diarrhea and constipation.  Genitourinary: Negative for urgency and frequency. neg for dysuria or blood in urine MSK pos for chronic pain " all over" especially in back, no swollen joints  Skin: Negative for pallor or rash   Neurological: Negative for weakness, light-headedness, numbness and headaches.  Hematological: Negative for adenopathy. Does not bruise/bleed easily.  Psychiatric/Behavioral: Negative for dysphoric mood. The patient is not nervous/anxious.          Objective:   Physical Exam  Constitutional: She appears well-developed and well-nourished. No distress.  HENT:  Head: Normocephalic and atraumatic.  Mouth/Throat: Oropharynx is clear and moist.  Eyes: Conjunctivae and EOM are normal. Pupils are equal, round, and reactive to light. Right eye exhibits no discharge. Left eye exhibits no discharge.  Neck: Normal range of motion. Neck supple. No JVD present. No thyromegaly present.  Cardiovascular: Normal rate, regular rhythm and normal heart sounds.   Pulmonary/Chest: Effort normal and breath sounds normal. No respiratory distress. She has no wheezes. She has no rales. She exhibits no tenderness.  Abdominal: Soft. Bowel sounds are normal. She exhibits no distension  and no mass. There is no tenderness.       No suprapubic tenderness    Musculoskeletal: She exhibits tenderness.  She exhibits no edema.       Poor rom LS and LEs - pt in wheechair Also diffusely weak   Lymphadenopathy:    She has no cervical adenopathy.  Neurological: She is alert. She has normal reflexes. No cranial nerve deficit.  Skin: Skin is warm and dry. No rash noted. No erythema. No pallor.  Psychiatric: She has a normal mood and affect.       Affect is bright - acutally quite cheerful today          Assessment & Plan:

## 2011-11-14 NOTE — Patient Instructions (Addendum)
Vitamin D level today  protime today  I'm glad you are feeling better  No change in medicines  Have [facility] call us with request for your needles so I know what size they are  You should continue seeing Dr Everardo All for your diabetes Follow up with me in about 3 months

## 2011-11-16 ENCOUNTER — Encounter: Payer: Self-pay | Admitting: *Deleted

## 2011-11-28 ENCOUNTER — Ambulatory Visit: Payer: Medicare Other

## 2011-12-01 ENCOUNTER — Ambulatory Visit: Payer: Medicare Other

## 2011-12-02 ENCOUNTER — Ambulatory Visit: Payer: Medicare Other

## 2011-12-02 ENCOUNTER — Other Ambulatory Visit: Payer: Self-pay | Admitting: *Deleted

## 2011-12-02 MED ORDER — "INSULIN SYRINGE-NEEDLE U-100 31G X 15/64"" 1 ML MISC": 1.0000 | Status: DC

## 2011-12-07 ENCOUNTER — Telehealth: Payer: Self-pay | Admitting: Radiology

## 2011-12-07 DIAGNOSIS — R531 Weakness: Secondary | ICD-10-CM

## 2011-12-07 DIAGNOSIS — G8929 Other chronic pain: Secondary | ICD-10-CM | POA: Insufficient documentation

## 2011-12-07 DIAGNOSIS — R296 Repeated falls: Secondary | ICD-10-CM | POA: Insufficient documentation

## 2011-12-07 DIAGNOSIS — I4891 Unspecified atrial fibrillation: Secondary | ICD-10-CM

## 2011-12-07 DIAGNOSIS — E538 Deficiency of other specified B group vitamins: Secondary | ICD-10-CM

## 2011-12-07 NOTE — Telephone Encounter (Signed)
I will do home health ref - unsure if ins will cover that without a visit  When she is able to get another family member to bring her who can push the wheelchair- we should sched appt so I can examine her from injuries from the fall also

## 2011-12-07 NOTE — Telephone Encounter (Signed)
Patient said she fell 2 days ago, not injured just sore, but wants home health to come to her house to do INR level. She states she is unable to walk down the ramp to the car and her husband can't maneuver the wheelchair.

## 2011-12-07 NOTE — Telephone Encounter (Signed)
Addended by: [name] A on: 12/07/2011 04:40 PM   Modules accepted: Orders

## 2011-12-07 NOTE — Telephone Encounter (Addendum)
Patient advised as instructed via telephone, she stated that she has a f/u appt in July but unsure if she can come in any sooner because she stays in so much pain.  She stated that the home health nurse that came out to her house before was from [facility].  She will wait to hear about referral.

## 2011-12-08 ENCOUNTER — Telehealth: Payer: Self-pay | Admitting: Family Medicine

## 2011-12-08 NOTE — Telephone Encounter (Signed)
Called [facility] to check to see if we could add the PT INR on to the last order for [facility] PT and Melissa said that she was done with the Home PT so we couldn't. [facility] cant just go out to the home to draw a lab their must be a skilled nursing need for them to go out to the home besides the lab draw. I called Ms Kaczorowski and told her what [facility] said. She says shes gonna try to come here next week and will call before she comes.

## 2011-12-08 NOTE — Telephone Encounter (Signed)
Thanks for the effort.

## 2011-12-12 ENCOUNTER — Other Ambulatory Visit: Payer: Self-pay | Admitting: *Deleted

## 2011-12-12 MED ORDER — INSULIN PEN NEEDLE 31G X 5 MM MISC: Status: DC

## 2011-12-15 ENCOUNTER — Ambulatory Visit (INDEPENDENT_AMBULATORY_CARE_PROVIDER_SITE_OTHER): Payer: Medicare Other | Admitting: Family Medicine

## 2011-12-15 DIAGNOSIS — I4891 Unspecified atrial fibrillation: Secondary | ICD-10-CM

## 2011-12-15 DIAGNOSIS — Z7901 Long term (current) use of anticoagulants: Secondary | ICD-10-CM

## 2011-12-15 DIAGNOSIS — Z5181 Encounter for therapeutic drug level monitoring: Secondary | ICD-10-CM

## 2011-12-15 LAB — POCT INR: INR: 1.5

## 2011-12-15 NOTE — Patient Instructions (Signed)
2.5 mg daily,5 mg t/th, 2 weeks

## 2012-01-03 ENCOUNTER — Ambulatory Visit: Payer: Medicare Other

## 2012-01-05 ENCOUNTER — Ambulatory Visit (INDEPENDENT_AMBULATORY_CARE_PROVIDER_SITE_OTHER): Payer: Medicare Other | Admitting: Family Medicine

## 2012-01-05 DIAGNOSIS — I4891 Unspecified atrial fibrillation: Secondary | ICD-10-CM

## 2012-01-05 DIAGNOSIS — Z5181 Encounter for therapeutic drug level monitoring: Secondary | ICD-10-CM

## 2012-01-05 DIAGNOSIS — Z7901 Long term (current) use of anticoagulants: Secondary | ICD-10-CM

## 2012-01-05 DIAGNOSIS — Z7902 Long term (current) use of antithrombotics/antiplatelets: Secondary | ICD-10-CM

## 2012-01-05 LAB — POCT INR: INR: 2.9

## 2012-01-05 NOTE — Patient Instructions (Signed)
Continue current dose, check in 4 weeks, after appt with the Dr

## 2012-01-17 ENCOUNTER — Ambulatory Visit (INDEPENDENT_AMBULATORY_CARE_PROVIDER_SITE_OTHER): Payer: Medicare Other | Admitting: Family Medicine

## 2012-01-17 ENCOUNTER — Encounter: Payer: Self-pay | Admitting: Family Medicine

## 2012-01-17 VITALS — BP 128/82 | HR 80 | Temp 97.6°F

## 2012-01-17 DIAGNOSIS — R5381 Other malaise: Secondary | ICD-10-CM

## 2012-01-17 DIAGNOSIS — R531 Weakness: Secondary | ICD-10-CM

## 2012-01-17 DIAGNOSIS — G8929 Other chronic pain: Secondary | ICD-10-CM

## 2012-01-17 DIAGNOSIS — Z9181 History of falling: Secondary | ICD-10-CM

## 2012-01-17 DIAGNOSIS — R296 Repeated falls: Secondary | ICD-10-CM

## 2012-01-17 NOTE — Assessment & Plan Note (Signed)
Pt uses walker or wheelchair  Is becoming more obese  Refuses PT  I do worry about safety of her home - also refuses assisted living at this time

## 2012-01-17 NOTE — Assessment & Plan Note (Signed)
Multi factorial in obese/ elderly/ diabetic/ deconditioned / unmotivated female with chronic pain  I think her house is not safe given that her walker does not fit into the bathroom- she refuses consideration of assisted living  She also refuses PT due to her chronic pain

## 2012-01-17 NOTE — Progress Notes (Signed)
Subjective:    Patient ID: [name], female    DOB: [birth year], 78 y.o.   MRN: [id number]  HPI 78 year old chronically ill pt f/u for frequent falls that are multi factorial Is doing about the same   Keeps falling - and last fall was this Sunday  Was standing at the sink and went to sit down in the chair - bumped her head against the cabinets - a lump came up later   Lives independently -- uses her walker - does not try to walk without it  Some trouble with rooms that are not big enough - has to hold on to furniture to get into her bathroom   Pt is sedentary and obese and diabetic, with generalized weakness due to deconditioning   (her knees are giving out) Chronic pain (ultram and robaxin)- avoiding stronger meds due to falls  Hx of vertigo as well   Declines PT due to pain , even though she knows it will help  Has pain all over all the time/ sees orthopedics/ multiple problems  Also will not watch diet to loose weight -not a priority  Patient Active Problem List  Diagnosis  . ADENOMATOUS COLONIC POLYP  . HYPOTHYROIDISM  . DIABETES MELLITUS, TYPE II  . HYPERLIPIDEMIA  . HYPERTENSION  . Atrial fibrillation  . CHRONIC RHINITIS  . GERD  . HIATAL HERNIA  . IRRITABLE BOWEL SYNDROME  . ROSACEA  . INTERTRIGO  . OSTEOPOROSIS  . UNSPECIFIED VITAMIN D DEFICIENCY  . Lump in the abdomen  . Fatigue  . B12 deficiency  . Pain of breast  . Dysphagia  . Generalized weakness  . Fever  . UTI (lower urinary tract infection)  . Sepsis  . Influenza B  . Benign positional vertigo  . Bronchitis with flu  . Pain, chronic  . Frequent falls   Past Medical History  Diagnosis Date  . Atrial fibrillation   . Diabetes mellitus     type II  . Anemia     Fe deficiency  . GERD (gastroesophageal reflux disease)   . Hyperlipidemia   . Hypertension   . Hypothyroid   . Obesity   . Vaginal dysplasia 1980  . Rosacea   . Chest pain 11/2002    cardiac cath neg   . Back pain   .  IBS (irritable bowel syndrome)     with diarrhea  . Asthma     as a child  . Adenomatous colon polyp 12/2007  . Hemorrhoids   . Vertigo   . Hiatal hernia 12/2007    EGD   Past Surgical History  Procedure Date  . Abdominal hysterectomy 1963  . Cholecystectomy 2006  . Knee surgery     Rt TKR 1993 & Lt TKR 2001  . Cervical cone biopsy 1963    D&C  . Appendectomy 1941  . Cystocele repair 1978    /rectocele  . Orif ankle fracture 01/2010    fall/followed by rehab stay  . Cataract extraction 2006    bilateral   History  Substance Use Topics  . Smoking status: Former Games developer  . Smokeless tobacco: Never Used  . Alcohol Use: No   Family History  Problem Relation Age of Onset  . Cancer Mother     leukemia  . Coronary artery disease Mother   . Dementia Mother   . Heart disease Father     CAD  . COPD Sister   . Heart disease Brother   .  Diabetes Brother   . Kidney disease Son   . Colon cancer Brother    Allergies  Allergen Reactions  . Insulin Glargine     Itching   . Latex     Rash   . Metformin And Related     Severe diarrhea   . Metoclopramide Hcl     REACTION: unspecified  . Nsaids     REACTION: unspecified  . Promethazine Hcl     REACTION: unspecified   Current Outpatient Prescriptions on File Prior to Visit  Medication Sig Dispense Refill  . acetaminophen (TYLENOL) 500 MG tablet Take 500 mg by mouth 2 (two) times daily.       . furosemide (LASIX) 40 MG tablet Take 1 tablet (40 mg total) by mouth daily.  90 tablet  3  . glucose blood test strip Use as instructed  100 each  12  . indapamide (LOZOL) 2.5 MG tablet Take 2.5 mg by mouth daily.      . insulin aspart (NOVOLOG) 100 UNIT/ML injection Inject 3 Units into the skin 3 (three) times daily with meals.  1 vial  0  . insulin detemir (LEVEMIR) 100 UNIT/ML injection Inject 64 Units into the skin every morning.  10 mL  5  . Insulin Pen Needle (B-D UF III MINI PEN NEEDLES) 31G X 5 MM MISC Use as directed for  insulin pen.  100 each  3  . Insulin Syringe-Needle U-100 (BD INSULIN SYRINGE ULTRAFINE) 31G X 15/64" 1 ML MISC Inject 1 each into the skin as directed. Use as directed with insulin injections  100 each  11  . levothyroxine (SYNTHROID, LEVOTHROID) 150 MCG tablet Take 150 mcg by mouth daily.      Marland Kitchen loperamide (IMODIUM A-D) 2 MG tablet Take 2 mg by mouth every morning.      . meclizine (ANTIVERT) 25 MG tablet Take 25 mg by mouth 3 (three) times daily as needed. For vertigo      . methocarbamol (ROBAXIN) 500 MG tablet Take 1 tablet (500 mg total) by mouth 3 (three) times daily as needed. For muscle spasm  270 tablet  3  . metoprolol (LOPRESSOR) 50 MG tablet Take 0.5 tablets (25 mg total) by mouth 2 (two) times daily.  90 tablet  3  . omeprazole (PRILOSEC) 20 MG capsule Take 20 mg by mouth daily.      . potassium chloride SA (K-DUR,KLOR-CON) 20 MEQ tablet Take 20 mEq by mouth 2 (two) times daily.      . traMADol (ULTRAM) 50 MG tablet Take 1 tablet (50 mg total) by mouth 3 (three) times daily as needed. pain  270 tablet  3  . warfarin (COUMADIN) 5 MG tablet Take 2.5 mg by mouth every evening. Take 5 mg Tues, Thurs & Saturday [2.5 mg other days].      . DISCONTD: simvastatin (ZOCOR) 40 MG tablet Take 1 tablet (40 mg total) by mouth at bedtime.  90 tablet  3      Review of Systems Review of Systems  Constitutional: Negative for fever, appetite change,  and unexpected weight change.pos for chronic fatigue  Eyes: Negative for pain and visual disturbance.  Respiratory: Negative for cough and shortness of breath.   Cardiovascular: Negative for cp or palpitations    Gastrointestinal: Negative for nausea, diarrhea and constipation.  Genitourinary: Negative for urgency and frequency.  Skin: Negative for pallor or rash   MSK pos for chronic pain in knees/ LS from arthritis  Neurological: Negative for ,  light-headedness, numbness and headaches. pos for overall weakness and poor balance with frequent falls    Hematological: Negative for adenopathy. Does not bruise/bleed easily.  Psychiatric/Behavioral: Negative for dysphoric mood. The patient is anxious        Objective:   Physical Exam  Constitutional: She appears well-developed and well-nourished. No distress.       Obese elderly female in wheelchair who cannot stand without assistance  HENT:  Head: Normocephalic and atraumatic.  Mouth/Throat: Oropharynx is clear and moist.  Eyes: Conjunctivae and EOM are normal. Pupils are equal, round, and reactive to light.  Neck: Normal range of motion. Neck supple. No JVD present. Carotid bruit is not present.  Cardiovascular: Normal rate and regular rhythm.   Pulmonary/Chest: Effort normal and breath sounds normal.  Musculoskeletal: She exhibits tenderness. She exhibits no edema.       Tender over LS and both knees with poor rom  Cannot rise or ambulate without substantial assistance  Lymphadenopathy:    She has no cervical adenopathy.  Neurological: She is alert. She has normal reflexes. She exhibits normal muscle tone.  Skin: Skin is warm and dry. No rash noted. No erythema. No pallor.  Psychiatric: Her mood appears anxious. Her speech is tangential.       Difficult to communicate with due to her innatentiveness/ and tangential thought patterns No signs of dementia, however  She is inattentive.          Assessment & Plan:

## 2012-01-17 NOTE — Assessment & Plan Note (Signed)
I do not feel comfortable being more aggressive with pain control in pt who frequently falls She is aware She also sees Dr Priscille Kluver Unfortunately- pt declines PT and is quite sedentary

## 2012-01-17 NOTE — Patient Instructions (Addendum)
We cannot treat pain more aggressively due to falls and imbalance  I think the only thing that will help you to stop falling is physical therapy (I am aware that is painful for you - but you need it anyway) In addition- for safety- I feel moving to an assisted living situation will be important in the future  I am worried you will get more and more weak and eventually loose your independence

## 2012-01-23 ENCOUNTER — Other Ambulatory Visit: Payer: Self-pay

## 2012-01-23 MED ORDER — TRAMADOL HCL 50 MG PO TABS: 50.0000 mg | ORAL_TABLET | Freq: Three times a day (TID) | ORAL | Status: DC | PRN

## 2012-01-23 NOTE — Telephone Encounter (Signed)
Pt takes Tramadol 50 mg two tabs q 6 h prn. Pt usually takes 2 or 3 times daily due to back and leg pain. Wants new rx sent to [facility]. Med list has 1 tab q 8 h prn. Since Dr Milinda Antis is out of office please advise.

## 2012-01-23 NOTE — Telephone Encounter (Signed)
Sent in 1 3 mo supply.  plz notify pt.  Looks like she should have had refills at pharmacy though.

## 2012-01-23 NOTE — Telephone Encounter (Signed)
Patient notified

## 2012-01-26 ENCOUNTER — Telehealth: Payer: Self-pay

## 2012-01-26 MED ORDER — INSULIN ASPART 100 UNIT/ML ~~LOC~~ SOLN: 3.0000 [IU] | Freq: Three times a day (TID) | SUBCUTANEOUS | Status: DC

## 2012-01-26 NOTE — Telephone Encounter (Signed)
Alician RN with [employer]  Left v/m spoke with pt today; pt complained with lower back pain on right side and difficulty getting out of bed. Would pt benefit from PT?  I spoke with pt; pt does not want to go to PT now; not able to get out of house to go. Erskine Squibb  will be faxing info regarding pt.

## 2012-01-27 ENCOUNTER — Telehealth: Payer: Self-pay | Admitting: *Deleted

## 2012-01-27 NOTE — Telephone Encounter (Signed)
Ok to change

## 2012-01-27 NOTE — Telephone Encounter (Signed)
Changed to flex pen at [facility], directions stay the same, changed in epic as well.

## 2012-01-27 NOTE — Telephone Encounter (Signed)
[facility] wants to change the novalog vials that were sent in yesterday to flexpens, per pt's request.  Ok to change?  Dr. Royden Purl patient.

## 2012-01-28 NOTE — Telephone Encounter (Signed)
I am aware - we had a long conversation at her last visit and she declines PT of any kind unfortunately - though I think it would be helpful

## 2012-01-30 NOTE — Telephone Encounter (Signed)
Left message for rn with information

## 2012-02-13 ENCOUNTER — Ambulatory Visit: Payer: Medicare Other

## 2012-02-13 ENCOUNTER — Ambulatory Visit: Payer: Medicare Other | Admitting: Family Medicine

## 2012-02-16 ENCOUNTER — Ambulatory Visit: Payer: Medicare Other

## 2012-02-17 ENCOUNTER — Ambulatory Visit (INDEPENDENT_AMBULATORY_CARE_PROVIDER_SITE_OTHER): Payer: Medicare Other | Admitting: Family Medicine

## 2012-02-17 DIAGNOSIS — Z5181 Encounter for therapeutic drug level monitoring: Secondary | ICD-10-CM

## 2012-02-17 DIAGNOSIS — Z7901 Long term (current) use of anticoagulants: Secondary | ICD-10-CM

## 2012-02-17 DIAGNOSIS — I4891 Unspecified atrial fibrillation: Secondary | ICD-10-CM

## 2012-02-17 NOTE — Patient Instructions (Signed)
Hold x 2 days, then 2.5 mg daily,reduced 5 mg weekly. Check in 5 days

## 2012-02-22 ENCOUNTER — Ambulatory Visit (INDEPENDENT_AMBULATORY_CARE_PROVIDER_SITE_OTHER): Payer: Medicare Other | Admitting: Family Medicine

## 2012-02-22 DIAGNOSIS — Z5181 Encounter for therapeutic drug level monitoring: Secondary | ICD-10-CM

## 2012-02-22 DIAGNOSIS — Z7901 Long term (current) use of anticoagulants: Secondary | ICD-10-CM

## 2012-02-22 DIAGNOSIS — I4891 Unspecified atrial fibrillation: Secondary | ICD-10-CM

## 2012-02-22 LAB — POCT INR: INR: 2.5

## 2012-02-22 NOTE — Patient Instructions (Signed)
2.5 mg daily, 2 weeks

## 2012-03-02 ENCOUNTER — Telehealth: Payer: Self-pay | Admitting: Family Medicine

## 2012-03-02 NOTE — Telephone Encounter (Signed)
I do not see this medication on the meds list, not even under history.  I phoned the patient and she says she has been taking it for a long time.  I asked if she knew the mg of the medication and she says she doesn't know.  I also asked if she had an old bottle to give me the information from and she said "No".  She says she thinks she has the info in her computer and if we are not able to find it, we can phone her on Monday.  Rx to be sent to [facility].  She says she is completely out of medication but does not want a local Rx sent.  She will wait for the mail order.  Please advise.

## 2012-03-02 NOTE — Telephone Encounter (Signed)
Patient is requesting a new refill for Simvistatin.  Please call to confirm pharmacy.  Patient did not leave info on pharmacy.

## 2012-03-04 NOTE — Telephone Encounter (Signed)
She has been on and off of it due to a question of whether it worsened her muscle pain - but I cannot find it on med hx either - let me know please from pt or her pharmacy what dose she was on so we can refil it (unless she thinks it caused pain and wants to go off of it )

## 2012-03-05 ENCOUNTER — Other Ambulatory Visit: Payer: Self-pay | Admitting: *Deleted

## 2012-03-05 MED ORDER — SIMVASTATIN 40 MG PO TABS: 40.0000 mg | ORAL_TABLET | Freq: Every day | ORAL | Status: DC

## 2012-03-05 NOTE — Telephone Encounter (Signed)
Patient states it is Simvastatin 40 mg.  I sent the Rx to [facility] as requested.

## 2012-03-07 ENCOUNTER — Ambulatory Visit (INDEPENDENT_AMBULATORY_CARE_PROVIDER_SITE_OTHER): Payer: Medicare Other | Admitting: Family Medicine

## 2012-03-07 DIAGNOSIS — I4891 Unspecified atrial fibrillation: Secondary | ICD-10-CM

## 2012-03-07 DIAGNOSIS — Z7901 Long term (current) use of anticoagulants: Secondary | ICD-10-CM

## 2012-03-07 DIAGNOSIS — Z5181 Encounter for therapeutic drug level monitoring: Secondary | ICD-10-CM

## 2012-03-07 LAB — POCT INR: INR: 1.7

## 2012-03-07 NOTE — Patient Instructions (Signed)
Continue current dose, check in 4 weeks  

## 2012-03-16 ENCOUNTER — Telehealth: Payer: Self-pay

## 2012-03-16 NOTE — Telephone Encounter (Signed)
Ok, will address it when it gets here and see what I need to do

## 2012-03-16 NOTE — Telephone Encounter (Signed)
Pt left v/m a company would be sending form for diabetic shoes. Pt wants form filled out.Tried to call pt back for more info but no answer or v/m.

## 2012-04-03 ENCOUNTER — Telehealth: Payer: Self-pay

## 2012-04-03 NOTE — Telephone Encounter (Signed)
It is 45-50% Please send them copy of latest echocardiogram, thanks

## 2012-04-03 NOTE — Telephone Encounter (Signed)
Pt is presently in CHF program with [facility].

## 2012-04-03 NOTE — Telephone Encounter (Signed)
Zerita Boers RN with [facility] request recent  Ejection fraction.Please advise.

## 2012-04-04 ENCOUNTER — Ambulatory Visit (INDEPENDENT_AMBULATORY_CARE_PROVIDER_SITE_OTHER): Payer: Medicare Other | Admitting: Family Medicine

## 2012-04-04 DIAGNOSIS — Z7901 Long term (current) use of anticoagulants: Secondary | ICD-10-CM

## 2012-04-04 DIAGNOSIS — I4891 Unspecified atrial fibrillation: Secondary | ICD-10-CM

## 2012-04-04 DIAGNOSIS — Z5181 Encounter for therapeutic drug level monitoring: Secondary | ICD-10-CM

## 2012-04-04 LAB — POCT INR: INR: 2.6

## 2012-04-04 NOTE — Patient Instructions (Signed)
continue 2.5 mg daily, recheck 4 weeks

## 2012-04-10 ENCOUNTER — Encounter: Payer: Self-pay | Admitting: Family Medicine

## 2012-04-10 ENCOUNTER — Telehealth: Payer: Self-pay | Admitting: *Deleted

## 2012-04-10 DIAGNOSIS — E1142 Type 2 diabetes mellitus with diabetic polyneuropathy: Secondary | ICD-10-CM | POA: Insufficient documentation

## 2012-04-10 NOTE — Telephone Encounter (Signed)
Form faxed to [phone]

## 2012-04-10 NOTE — Telephone Encounter (Signed)
Received a faxed form to be completed and faxed back for diabetic shoes. Spoke to patient and was advised that she did request this from the company. Form is in your in box.

## 2012-04-10 NOTE — Telephone Encounter (Signed)
Done and in IN box 

## 2012-04-18 ENCOUNTER — Ambulatory Visit: Payer: Medicare Other | Admitting: Family Medicine

## 2012-05-02 ENCOUNTER — Ambulatory Visit (INDEPENDENT_AMBULATORY_CARE_PROVIDER_SITE_OTHER): Payer: Medicare Other | Admitting: Family Medicine

## 2012-05-02 DIAGNOSIS — Z7901 Long term (current) use of anticoagulants: Secondary | ICD-10-CM

## 2012-05-02 DIAGNOSIS — Z5181 Encounter for therapeutic drug level monitoring: Secondary | ICD-10-CM

## 2012-05-02 DIAGNOSIS — I4891 Unspecified atrial fibrillation: Secondary | ICD-10-CM

## 2012-05-02 NOTE — Patient Instructions (Signed)
Continue current dose, check in 4 weeks  

## 2012-05-14 ENCOUNTER — Other Ambulatory Visit: Payer: Self-pay | Admitting: Neurology

## 2012-05-14 DIAGNOSIS — R51 Headache: Secondary | ICD-10-CM

## 2012-05-15 ENCOUNTER — Other Ambulatory Visit: Payer: Medicare Other

## 2012-05-17 ENCOUNTER — Other Ambulatory Visit: Payer: Medicare Other

## 2012-05-18 ENCOUNTER — Ambulatory Visit: Payer: Medicare Other | Admitting: Family Medicine

## 2012-05-22 ENCOUNTER — Ambulatory Visit
Admission: RE | Admit: 2012-05-22 | Discharge: 2012-05-22 | Disposition: A | Discharge status: Home / Self Care | Payer: Medicare Other | Source: Ambulatory Visit | Attending: Neurology | Admitting: Neurology

## 2012-05-22 DIAGNOSIS — R51 Headache: Secondary | ICD-10-CM

## 2012-06-04 ENCOUNTER — Ambulatory Visit (INDEPENDENT_AMBULATORY_CARE_PROVIDER_SITE_OTHER): Payer: Medicare Other | Admitting: Family Medicine

## 2012-06-04 DIAGNOSIS — I4891 Unspecified atrial fibrillation: Secondary | ICD-10-CM

## 2012-06-04 DIAGNOSIS — Z7901 Long term (current) use of anticoagulants: Secondary | ICD-10-CM

## 2012-06-04 DIAGNOSIS — Z5181 Encounter for therapeutic drug level monitoring: Secondary | ICD-10-CM

## 2012-06-04 NOTE — Patient Instructions (Signed)
2.5 mg daily, recheck 2  weeks

## 2012-06-07 ENCOUNTER — Encounter: Payer: Self-pay | Admitting: Family Medicine

## 2012-06-07 ENCOUNTER — Ambulatory Visit (INDEPENDENT_AMBULATORY_CARE_PROVIDER_SITE_OTHER): Payer: Medicare Other | Admitting: Family Medicine

## 2012-06-07 VITALS — BP 114/70 | HR 97 | Temp 98.5°F

## 2012-06-07 DIAGNOSIS — J069 Acute upper respiratory infection, unspecified: Secondary | ICD-10-CM

## 2012-06-07 NOTE — Progress Notes (Signed)
duration of symptoms: a few days, started with subjective alteration in temp. Then rhinorrhea and cough.  Rhinorrhea: yes, persistent Congestion: yes, persistent ear pain: no sore throat: some this AM, better now Cough: minimal Myalgias: no change from baseline OA pain.  other concerns: voice isn't altered Rhinorrhea and postnasal gtt bother her the most.   On coumadin.   Husband sick at home first.   ROS: See HPI.  Otherwise negative.    Meds, vitals, and allergies reviewed.   GEN: nad, alert and oriented, obese in wheelchair.  HEENT: mucous membranes moist, TM w/o erythema, nasal epithelium injected with clear rhinorrhea, OP with very mild cobblestoning NECK: supple w/o LA CV: IRR, not tachy PULM: ctab, no inc wob, exam limited by habitus ABD: soft, +bs, obese

## 2012-06-07 NOTE — Patient Instructions (Addendum)
This should get better gradually.  I would get plain loratadine (claritin) 10mg  a day for the runny nose.  Take care.

## 2012-06-07 NOTE — Assessment & Plan Note (Signed)
Likely viral, nontoxic.  Would use claritin for rhinorrhea and f/u prn.  Should resolve.  D/w pt re: ddx. No indication for abx based on exam

## 2012-06-15 ENCOUNTER — Emergency Department (HOSPITAL_COMMUNITY): Payer: Medicare Other

## 2012-06-15 ENCOUNTER — Telehealth: Payer: Self-pay

## 2012-06-15 ENCOUNTER — Inpatient Hospital Stay (HOSPITAL_COMMUNITY)
Admission: EM | Admit: 2012-06-15 | Discharge: 2012-06-21 | DRG: 871 | Disposition: A | Discharge status: Home Health | Payer: Medicare Other | Attending: Emergency Medicine | Admitting: Emergency Medicine

## 2012-06-15 ENCOUNTER — Encounter (HOSPITAL_COMMUNITY): Payer: Self-pay | Admitting: *Deleted

## 2012-06-15 DIAGNOSIS — K449 Diaphragmatic hernia without obstruction or gangrene: Secondary | ICD-10-CM

## 2012-06-15 DIAGNOSIS — H8110 Benign paroxysmal vertigo, unspecified ear: Secondary | ICD-10-CM

## 2012-06-15 DIAGNOSIS — E1149 Type 2 diabetes mellitus with other diabetic neurological complication: Secondary | ICD-10-CM

## 2012-06-15 DIAGNOSIS — N644 Mastodynia: Secondary | ICD-10-CM

## 2012-06-15 DIAGNOSIS — R5383 Other fatigue: Secondary | ICD-10-CM

## 2012-06-15 DIAGNOSIS — R1900 Intra-abdominal and pelvic swelling, mass and lump, unspecified site: Secondary | ICD-10-CM

## 2012-06-15 DIAGNOSIS — D649 Anemia, unspecified: Secondary | ICD-10-CM

## 2012-06-15 DIAGNOSIS — I10 Essential (primary) hypertension: Secondary | ICD-10-CM | POA: Diagnosis present

## 2012-06-15 DIAGNOSIS — R296 Repeated falls: Secondary | ICD-10-CM

## 2012-06-15 DIAGNOSIS — J101 Influenza due to other identified influenza virus with other respiratory manifestations: Secondary | ICD-10-CM

## 2012-06-15 DIAGNOSIS — J9600 Acute respiratory failure, unspecified whether with hypoxia or hypercapnia: Secondary | ICD-10-CM | POA: Diagnosis present

## 2012-06-15 DIAGNOSIS — L538 Other specified erythematous conditions: Secondary | ICD-10-CM

## 2012-06-15 DIAGNOSIS — K219 Gastro-esophageal reflux disease without esophagitis: Secondary | ICD-10-CM

## 2012-06-15 DIAGNOSIS — L719 Rosacea, unspecified: Secondary | ICD-10-CM

## 2012-06-15 DIAGNOSIS — R509 Fever, unspecified: Secondary | ICD-10-CM

## 2012-06-15 DIAGNOSIS — I4891 Unspecified atrial fibrillation: Secondary | ICD-10-CM | POA: Diagnosis present

## 2012-06-15 DIAGNOSIS — G9341 Metabolic encephalopathy: Secondary | ICD-10-CM | POA: Diagnosis present

## 2012-06-15 DIAGNOSIS — M810 Age-related osteoporosis without current pathological fracture: Secondary | ICD-10-CM

## 2012-06-15 DIAGNOSIS — E876 Hypokalemia: Secondary | ICD-10-CM | POA: Diagnosis present

## 2012-06-15 DIAGNOSIS — K589 Irritable bowel syndrome without diarrhea: Secondary | ICD-10-CM | POA: Diagnosis present

## 2012-06-15 DIAGNOSIS — E871 Hypo-osmolality and hyponatremia: Secondary | ICD-10-CM | POA: Diagnosis present

## 2012-06-15 DIAGNOSIS — E860 Dehydration: Secondary | ICD-10-CM

## 2012-06-15 DIAGNOSIS — D126 Benign neoplasm of colon, unspecified: Secondary | ICD-10-CM

## 2012-06-15 DIAGNOSIS — R531 Weakness: Secondary | ICD-10-CM

## 2012-06-15 DIAGNOSIS — A403 Sepsis due to Streptococcus pneumoniae: Principal | ICD-10-CM | POA: Diagnosis present

## 2012-06-15 DIAGNOSIS — E119 Type 2 diabetes mellitus without complications: Secondary | ICD-10-CM | POA: Diagnosis present

## 2012-06-15 DIAGNOSIS — N390 Urinary tract infection, site not specified: Secondary | ICD-10-CM | POA: Diagnosis present

## 2012-06-15 DIAGNOSIS — J111 Influenza due to unidentified influenza virus with other respiratory manifestations: Secondary | ICD-10-CM

## 2012-06-15 DIAGNOSIS — Z79899 Other long term (current) drug therapy: Secondary | ICD-10-CM

## 2012-06-15 DIAGNOSIS — X58XXXA Exposure to other specified factors, initial encounter: Secondary | ICD-10-CM | POA: Diagnosis present

## 2012-06-15 DIAGNOSIS — J069 Acute upper respiratory infection, unspecified: Secondary | ICD-10-CM

## 2012-06-15 DIAGNOSIS — G8929 Other chronic pain: Secondary | ICD-10-CM

## 2012-06-15 DIAGNOSIS — E785 Hyperlipidemia, unspecified: Secondary | ICD-10-CM

## 2012-06-15 DIAGNOSIS — E1142 Type 2 diabetes mellitus with diabetic polyneuropathy: Secondary | ICD-10-CM

## 2012-06-15 DIAGNOSIS — S91309A Unspecified open wound, unspecified foot, initial encounter: Secondary | ICD-10-CM | POA: Diagnosis present

## 2012-06-15 DIAGNOSIS — D638 Anemia in other chronic diseases classified elsewhere: Secondary | ICD-10-CM | POA: Diagnosis present

## 2012-06-15 DIAGNOSIS — A419 Sepsis, unspecified organism: Secondary | ICD-10-CM | POA: Diagnosis present

## 2012-06-15 DIAGNOSIS — Z794 Long term (current) use of insulin: Secondary | ICD-10-CM

## 2012-06-15 DIAGNOSIS — Z9861 Coronary angioplasty status: Secondary | ICD-10-CM

## 2012-06-15 DIAGNOSIS — E039 Hypothyroidism, unspecified: Secondary | ICD-10-CM | POA: Diagnosis present

## 2012-06-15 DIAGNOSIS — E538 Deficiency of other specified B group vitamins: Secondary | ICD-10-CM

## 2012-06-15 DIAGNOSIS — J189 Pneumonia, unspecified organism: Secondary | ICD-10-CM | POA: Diagnosis present

## 2012-06-15 DIAGNOSIS — E559 Vitamin D deficiency, unspecified: Secondary | ICD-10-CM

## 2012-06-15 DIAGNOSIS — E669 Obesity, unspecified: Secondary | ICD-10-CM | POA: Diagnosis present

## 2012-06-15 DIAGNOSIS — Z7901 Long term (current) use of anticoagulants: Secondary | ICD-10-CM

## 2012-06-15 DIAGNOSIS — J310 Chronic rhinitis: Secondary | ICD-10-CM

## 2012-06-15 LAB — URINE MICROSCOPIC-ADD ON

## 2012-06-15 LAB — URINALYSIS, ROUTINE W REFLEX MICROSCOPIC
Specific Gravity, Urine: 1.023 (ref 1.005–1.030)
Urobilinogen, UA: 1 mg/dL (ref 0.0–1.0)
pH: 5.5 (ref 5.0–8.0)

## 2012-06-15 LAB — CBC WITH DIFFERENTIAL/PLATELET
Basophils Relative: 0 % (ref 0–1)
Eosinophils Absolute: 0 10*3/uL (ref 0.0–0.7)
HCT: 35.2 % — ABNORMAL LOW (ref 36.0–46.0)
Hemoglobin: 12.3 g/dL (ref 12.0–15.0)
MCH: 31.7 pg (ref 26.0–34.0)
MCHC: 34.9 g/dL (ref 30.0–36.0)
Monocytes Absolute: 2 10*3/uL — ABNORMAL HIGH (ref 0.1–1.0)
Monocytes Relative: 9 % (ref 3–12)

## 2012-06-15 LAB — BASIC METABOLIC PANEL
BUN: 18 mg/dL (ref 6–23)
Calcium: 9.3 mg/dL (ref 8.4–10.5)
Creatinine, Ser: 0.99 mg/dL (ref 0.50–1.10)
GFR calc Af Amer: 58 mL/min — ABNORMAL LOW (ref >90)
GFR calc non Af Amer: 50 mL/min — ABNORMAL LOW (ref >90)

## 2012-06-15 MED ORDER — SIMVASTATIN 40 MG PO TABS
40.0000 mg | ORAL_TABLET | Freq: Every day | ORAL | Status: DC
  Administered 2012-06-15 – 2012-06-20 (×6): 40 mg via ORAL
  Filled 2012-06-15 (×7): qty 1

## 2012-06-15 MED ORDER — ONDANSETRON HCL 4 MG PO TABS: 4.0000 mg | ORAL_TABLET | Freq: Four times a day (QID) | ORAL | Status: DC | PRN

## 2012-06-15 MED ORDER — WARFARIN SODIUM 2.5 MG PO TABS
2.5000 mg | ORAL_TABLET | Freq: Once | ORAL | Status: AC
  Administered 2012-06-15: 2.5 mg via ORAL
  Filled 2012-06-15: qty 1

## 2012-06-15 MED ORDER — DEXTROSE 5 % IV SOLN
1.0000 g | Freq: Once | INTRAVENOUS | Status: AC
  Administered 2012-06-15: 1 g via INTRAVENOUS
  Filled 2012-06-15: qty 10

## 2012-06-15 MED ORDER — METOPROLOL TARTRATE 25 MG PO TABS
25.0000 mg | ORAL_TABLET | Freq: Two times a day (BID) | ORAL | Status: DC
  Administered 2012-06-15 – 2012-06-21 (×12): 25 mg via ORAL
  Filled 2012-06-15 (×13): qty 1

## 2012-06-15 MED ORDER — SODIUM CHLORIDE 0.9 % IV SOLN
INTRAVENOUS | Status: DC
  Administered 2012-06-15: 18:00:00 via INTRAVENOUS
  Administered 2012-06-20: 20 mL/h via INTRAVENOUS

## 2012-06-15 MED ORDER — SODIUM CHLORIDE 0.9 % IJ SOLN
3.0000 mL | Freq: Two times a day (BID) | INTRAMUSCULAR | Status: DC
  Administered 2012-06-16: 22:00:00 via INTRAVENOUS
  Administered 2012-06-17 – 2012-06-20 (×4): 3 mL via INTRAVENOUS

## 2012-06-15 MED ORDER — INSULIN ASPART 100 UNIT/ML ~~LOC~~ SOLN
6.0000 [IU] | Freq: Three times a day (TID) | SUBCUTANEOUS | Status: DC
  Administered 2012-06-15 – 2012-06-21 (×15): 6 [IU] via SUBCUTANEOUS

## 2012-06-15 MED ORDER — WARFARIN SODIUM 2.5 MG PO TABS: 2.5000 mg | ORAL_TABLET | Freq: Every evening | ORAL | Status: DC

## 2012-06-15 MED ORDER — ACETAMINOPHEN 650 MG RE SUPP: 650.0000 mg | Freq: Four times a day (QID) | RECTAL | Status: DC | PRN

## 2012-06-15 MED ORDER — ACETAMINOPHEN 500 MG PO TABS
1000.0000 mg | ORAL_TABLET | Freq: Once | ORAL | Status: AC
  Administered 2012-06-15: 1000 mg via ORAL
  Filled 2012-06-15: qty 2

## 2012-06-15 MED ORDER — METOPROLOL TARTRATE 1 MG/ML IV SOLN
5.0000 mg | Freq: Once | INTRAVENOUS | Status: AC
  Administered 2012-06-15: 5 mg via INTRAVENOUS
  Filled 2012-06-15: qty 5

## 2012-06-15 MED ORDER — SODIUM CHLORIDE 0.9 % IV BOLUS (SEPSIS)
1000.0000 mL | Freq: Once | INTRAVENOUS | Status: AC
  Administered 2012-06-15: 1000 mL via INTRAVENOUS

## 2012-06-15 MED ORDER — METOPROLOL TARTRATE 1 MG/ML IV SOLN
5.0000 mg | Freq: Four times a day (QID) | INTRAVENOUS | Status: DC | PRN
  Administered 2012-06-16 – 2012-06-17 (×3): 5 mg via INTRAVENOUS
  Filled 2012-06-15 (×3): qty 5

## 2012-06-15 MED ORDER — ACETAMINOPHEN 325 MG PO TABS
650.0000 mg | ORAL_TABLET | Freq: Four times a day (QID) | ORAL | Status: DC | PRN
  Administered 2012-06-15 – 2012-06-19 (×2): 650 mg via ORAL
  Filled 2012-06-15 (×3): qty 2

## 2012-06-15 MED ORDER — POLYETHYLENE GLYCOL 3350 17 G PO PACK
17.0000 g | PACK | Freq: Every day | ORAL | Status: DC | PRN
  Administered 2012-06-19: 17 g via ORAL
  Filled 2012-06-15: qty 1

## 2012-06-15 MED ORDER — WARFARIN - PHARMACIST DOSING INPATIENT: Freq: Every day | Status: DC

## 2012-06-15 MED ORDER — ONDANSETRON HCL 4 MG/2ML IJ SOLN: 4.0000 mg | Freq: Four times a day (QID) | INTRAMUSCULAR | Status: DC | PRN

## 2012-06-15 MED ORDER — METOPROLOL TARTRATE 25 MG PO TABS
25.0000 mg | ORAL_TABLET | Freq: Two times a day (BID) | ORAL | Status: DC
  Filled 2012-06-15: qty 1

## 2012-06-15 MED ORDER — LEVOTHYROXINE SODIUM 150 MCG PO TABS
150.0000 ug | ORAL_TABLET | Freq: Every day | ORAL | Status: DC
  Administered 2012-06-15 – 2012-06-21 (×7): 150 ug via ORAL
  Filled 2012-06-15 (×8): qty 1

## 2012-06-15 MED ORDER — INSULIN ASPART 100 UNIT/ML ~~LOC~~ SOLN
0.0000 [IU] | Freq: Every day | SUBCUTANEOUS | Status: DC
  Administered 2012-06-17: 4 [IU] via SUBCUTANEOUS
  Administered 2012-06-18: 3 [IU] via SUBCUTANEOUS
  Administered 2012-06-19: 5 [IU] via SUBCUTANEOUS
  Administered 2012-06-20: 4 [IU] via SUBCUTANEOUS

## 2012-06-15 MED ORDER — ACETAMINOPHEN 500 MG PO TABS
500.0000 mg | ORAL_TABLET | Freq: Two times a day (BID) | ORAL | Status: DC
  Administered 2012-06-16 – 2012-06-21 (×5): 500 mg via ORAL
  Filled 2012-06-15 (×13): qty 1

## 2012-06-15 MED ORDER — INSULIN DETEMIR 100 UNIT/ML ~~LOC~~ SOLN
30.0000 [IU] | Freq: Every day | SUBCUTANEOUS | Status: DC
  Administered 2012-06-16 – 2012-06-18 (×3): 30 [IU] via SUBCUTANEOUS
  Filled 2012-06-15: qty 10

## 2012-06-15 MED ORDER — INSULIN ASPART 100 UNIT/ML ~~LOC~~ SOLN
0.0000 [IU] | Freq: Three times a day (TID) | SUBCUTANEOUS | Status: DC
  Administered 2012-06-15: 3 [IU] via SUBCUTANEOUS
  Administered 2012-06-16 – 2012-06-17 (×3): 4 [IU] via SUBCUTANEOUS
  Administered 2012-06-17: 15 [IU] via SUBCUTANEOUS
  Administered 2012-06-17: 3 [IU] via SUBCUTANEOUS
  Administered 2012-06-18: 11 [IU] via SUBCUTANEOUS
  Administered 2012-06-18 – 2012-06-19 (×3): 15 [IU] via SUBCUTANEOUS
  Administered 2012-06-19 (×2): 11 [IU] via SUBCUTANEOUS
  Administered 2012-06-20: 7 [IU] via SUBCUTANEOUS
  Administered 2012-06-20: 20 [IU] via SUBCUTANEOUS
  Administered 2012-06-20: 15 [IU] via SUBCUTANEOUS
  Administered 2012-06-21: 11 [IU] via SUBCUTANEOUS
  Administered 2012-06-21: 20 [IU] via SUBCUTANEOUS

## 2012-06-15 MED ORDER — SODIUM CHLORIDE 0.9 % IV SOLN
INTRAVENOUS | Status: DC
  Administered 2012-06-15 (×2): via INTRAVENOUS

## 2012-06-15 MED ORDER — SODIUM CHLORIDE 0.9 % IV BOLUS (SEPSIS)
250.0000 mL | Freq: Once | INTRAVENOUS | Status: DC
  Administered 2012-06-15: 250 mL via INTRAVENOUS

## 2012-06-15 NOTE — ED Notes (Signed)
Per EDP Effie Shy, pt will not need blood cultures. Abx may be started without blood cultures.

## 2012-06-15 NOTE — Progress Notes (Signed)
   CARE MANAGEMENT NOTE 06/15/2012  Patient:  [name], [name]   Account Number:  [id number]  Date Initiated:  06/15/2012  Documentation initiated by:  Jiles Crocker  Subjective/Objective Assessment:   ADMITTED WITH SEPSIS/ UTI     Action/Plan:   PCP: [name], MD  LIVES AT HOME; PSOSIBLY NEED HHC VS SHORT TERM SNF; AWAITING ON PT/OT EVALS FOR DISPOSITION   Anticipated DC Date:  06/22/2012   Anticipated DC Plan:          Status of service:  In process, will continue to follow Medicare Important Message given?  NA - LOS <3 / Initial given by admissions (If response is "NO", the following Medicare IM given date fields will be blank)  Per UR Regulation:  Reviewed for med. necessity/level of care/duration of stay  Comments:  06/15/2012- B Reef Achterberg RN,BSN,MHA

## 2012-06-15 NOTE — Telephone Encounter (Signed)
Gave verbal ok for hospice referral and okayed a Hospice doctor to help with sxs management, Josephene with hospice wants to know if the diagnosis would be Dysphagia or was there another diagnosis you wanted to use, please advise

## 2012-06-15 NOTE — Telephone Encounter (Signed)
Yes to both of those requests, thank you

## 2012-06-15 NOTE — ED Notes (Signed)
Pt states that the only place anyone can ever get blood from is the inside of the wrist.  Looked at arm/hand - didn't see anything.  RN aware that PBT cannot draw from that site.  RN to draw labs, will let me know if i need to go back in room and look for a different site.

## 2012-06-15 NOTE — Progress Notes (Signed)
CSW received referral that patient may have disposition needs. Pt currently lives at home, awaiting pt/ot evaluations to determine pt disposition needs. Per chart review pt has hospice.   Catha Gosselin, LCSWA  [phone] .06/15/2012 11:26pm

## 2012-06-15 NOTE — H&P (Signed)
[facility] History and Physical  [name]:[id number] DOB: 05-26-1923 DOA: 06/15/2012  Referring physician: Dr Effie Shy PCP: [name], MD  Specialists: none  Chief Complaint: Confusion  HPI: [name] is a 78 y.o. female  Past medical history of atrial fibrillation currently on Coumadin, most of the history is obtained from the chart as the patient is slightly confused has been coughing for one week, yesterday she became confused as per son. She has had a history of urinary tract infections with confusion. She also had decreased appetite. The report nor fever chills nausea vomiting or diarrhea. He wanted to the ED also because she was not able to get up from bed.  Review of Systems: The patient denies anorexia, fever, weight loss,, vision loss, decreased hearing, hoarseness, chest pain, syncope, dyspnea on exertion, peripheral edema, balance deficits, hemoptysis, abdominal pain, melena, hematochezia, severe indigestion/heartburn, hematuria, incontinence, genital sores, muscle weakness, suspicious skin lesions, transient blindness, difficulty walking, depression, unusual weight change, abnormal bleeding, enlarged lymph nodes, angioedema, and breast masses.    Past Medical History  Diagnosis Date  . Atrial fibrillation   . Diabetes mellitus     type II  . Anemia     Fe deficiency  . GERD (gastroesophageal reflux disease)   . Hyperlipidemia   . Hypertension   . Hypothyroid   . Obesity   . Vaginal dysplasia 1980  . Rosacea   . Chest pain 11/2002    cardiac cath neg   . Back pain   . IBS (irritable bowel syndrome)     with diarrhea  . Asthma     as a child  . Adenomatous colon polyp 12/2007  . Hemorrhoids   . Vertigo   . Hiatal hernia 12/2007    EGD   Past Surgical History  Procedure Date  . Abdominal hysterectomy 1963  . Cholecystectomy 2006  . Knee surgery     Rt TKR 1993 & Lt TKR 2001  . Cervical cone biopsy 1963    D&C  . Appendectomy 1941    . Cystocele repair 1978    /rectocele  . Orif ankle fracture 01/2010    fall/followed by rehab stay  . Cataract extraction 2006    bilateral   Social History:  reports that she has quit smoking. She has never used smokeless tobacco. She reports that she does not drink alcohol or use illicit drugs. Visit home by herself can perform all her ADLs  Allergies  Allergen Reactions  . Insulin Glargine     Itching   . Latex     Rash   . Metformin And Related     Severe diarrhea   . Metoclopramide Hcl     REACTION: unspecified  . Nsaids     REACTION: unspecified  . Promethazine Hcl     REACTION: unspecified    Family History  Problem Relation Age of Onset  . Cancer Mother     leukemia  . Coronary artery disease Mother   . Dementia Mother   . Heart disease Father     CAD  . COPD Sister   . Heart disease Brother   . Diabetes Brother   . Kidney disease Son   . Colon cancer Brother     Prior to Admission medications   Medication Sig Start Date End Date Taking? Authorizing Provider  acetaminophen (TYLENOL) 500 MG tablet Take 500 mg by mouth 2 (two) times daily.    Yes Historical Provider, MD  glucose blood  test strip Use as instructed 11/14/11 11/13/12 Yes Marne A Tower, MD  indapamide (LOZOL) 2.5 MG tablet Take 2.5 mg by mouth daily. 08/31/11  Yes Judy Pimple, MD  insulin aspart (NOVOLOG FLEXPEN) 100 UNIT/ML injection Inject into the skin 3 (three) times daily before meals.   Yes Historical Provider, MD  insulin detemir (LEVEMIR) 100 UNIT/ML injection Inject 60 Units into the skin every morning. 11/14/11 11/13/12 Yes Marne A Tower, MD  Insulin Pen Needle (B-D UF III MINI PEN NEEDLES) 31G X 5 MM MISC Use as directed for insulin pen. 12/12/11  Yes Judy Pimple, MD  Insulin Syringe-Needle U-100 (BD INSULIN SYRINGE ULTRAFINE) 31G X 15/64" 1 ML MISC Inject 1 each into the skin as directed. Use as directed with insulin injections 12/02/11  Yes Judy Pimple, MD  levothyroxine (SYNTHROID,  LEVOTHROID) 150 MCG tablet Take 150 mcg by mouth daily. 08/31/11  Yes Judy Pimple, MD  loperamide (IMODIUM A-D) 2 MG tablet Take 2 mg by mouth every morning. 03/03/11  Yes Meryl Dare, MD,FACG  meclizine (ANTIVERT) 25 MG tablet Take 25 mg by mouth 3 (three) times daily as needed. For vertigo   Yes Historical Provider, MD  methocarbamol (ROBAXIN) 500 MG tablet Take 1 tablet (500 mg total) by mouth 3 (three) times daily as needed. For muscle spasm 11/14/11  Yes Judy Pimple, MD  metoprolol (LOPRESSOR) 50 MG tablet Take 0.5 tablets (25 mg total) by mouth 2 (two) times daily. 08/31/11  Yes Judy Pimple, MD  omeprazole (PRILOSEC) 20 MG capsule Take 20 mg by mouth daily.   Yes Judy Pimple, MD  potassium chloride SA (K-DUR,KLOR-CON) 20 MEQ tablet Take 20 mEq by mouth 2 (two) times daily.   Yes Judy Pimple, MD  simvastatin (ZOCOR) 40 MG tablet Take 1 tablet (40 mg total) by mouth at bedtime. 03/05/12 03/05/13 Yes Judy Pimple, MD  traMADol (ULTRAM) 50 MG tablet Take 100 mg by mouth 3 (three) times daily as needed. pain 01/23/12  Yes Eustaquio Boyden, MD  warfarin (COUMADIN) 5 MG tablet Take 2.5 mg by mouth every evening. Take 5 mg Tues, Thurs & Saturday [2.5 mg other days]. 01/10/11  Yes Judy Pimple, MD   Physical Exam: Filed Vitals:   06/15/12 1330 06/15/12 1400 06/15/12 1409 06/15/12 1425  BP:   119/78 119/78  Pulse: 120 112 112 124  Temp:   98.8 F (37.1 C)   TempSrc:   Oral   Resp: 26 25 24    SpO2: 98% 94% 96% 92%     General:  Awake alert and oriented x1, in no acute distress, disheveled foul-smelling. Nontoxic appearing  Eyes: Anicteric  ENT: Dry mucous membranes  Neck: No appreciated JVD no bruit  Cardiovascular: Tachycardic with any regular rate positive S1 and S2  Respiratory: Good air movement and clear to auscultation  Abdomen: Positive bowel sounds, with mild suprapubic tenderness but no rebound or guarding, nondistended and soft  Skin: No new rashes ulcerations, she has  couple bruises in her arms.  Musculoskeletal: Intact  Psychiatric: Not able to evaluate  Neurologic: She not able to follow strict commands. But when she is moving she is able to move all 4 extremities without any difficulties. Reflexes are intact.  Labs on Admission:  Basic Metabolic Panel:  Lab 06/15/12 1191  NA 131*  K 3.9  CL 92*  CO2 24  GLUCOSE 90  BUN 18  CREATININE 0.99  CALCIUM 9.3  MG --  PHOS --   Liver Function Tests: No results found for this basename: AST:5,ALT:5,ALKPHOS:5,BILITOT:5,PROT:5,ALBUMIN:5 in the last 168 hours No results found for this basename: LIPASE:5,AMYLASE:5 in the last 168 hours No results found for this basename: AMMONIA:5 in the last 168 hours CBC:  Lab 06/15/12 1105  WBC 22.7*  NEUTROABS 19.1*  HGB 12.3  HCT 35.2*  MCV 90.7  PLT 309   Cardiac Enzymes: No results found for this basename: CKTOTAL:5,CKMB:5,CKMBINDEX:5,TROPONINI:5 in the last 168 hours  BNP (last 3 results)  Basename 10/15/11 1202  PROBNP 1234.0*   CBG: No results found for this basename: GLUCAP:5 in the last 168 hours  Radiological Exams on Admission: Dg Chest 2 View  06/15/2012  *RADIOLOGY REPORT*  Clinical Data: Shortness of breath.  Weakness.  Urinary tract infection.  CHEST - 2 VIEW  Comparison: 10/15/2011  Findings: Low lung volumes seen however both lungs are clear. Heart size is stable within normal limits.  No evidence of pleural effusion.  IMPRESSION: Low lung volumes.  No acute findings.   Original Report Authenticated By: Myles Rosenthal, M.D.     EKG: A. fib with RVR at a heart rate of 140 no ST segment changes.  Assessment/Plan Principal Problem: Sepsis: - She does have an elevated white count, with a heart rate greater than 100, in atrial fibrillation, with tachypnea and pleasantly confused.  - We'll go ahead and continue her on empiric treatment for urinary tract infection. Also get urine cultures and blood cultures. I agree with the emergency room  to continue Rocephin. We'll also cover respiratory tract infections as she does have a little bit of crackles in lungs bilaterally. So we'll start her on azithromycin. Get a chest x-ray tomorrow morning. A pneumonia can flareup after hydration, and the patient is complaining of cough.  Metabolic encephalopathy: - This most likely secondary to infectious etiology. She is slightly hyponatremic it is greater than 125 I doubt this is contributing to her metabolic and followup with me. He'll continue treatment for infectious causes. And reevaluate in the morning.  -He is nontoxic-appearing.   UTI (lower urinary tract infection) - Foul-smelling urine only shows 2-6 white blood cell, she does have suprapubic tenderness, she's gotten like this in the past with urinary tract infection. And agree with the emergency room to start her on IV Rocephin, followup on urine cultures we'll go ahead and add blood cultures.  Atrial fibrillation with RVR - That she was well controlled with low-dose metoprolol and 1 L of normal saline. We'll go ahead and give her another liter of normal saline. Continue current 125 normal saline IV. Will use metoprolol for heart rate greater than 110. We'll admit her to telemetry. We'll start her on home dose of metoprolol. I am guessing that 1 her decreased intravascular volume is repleted her heart rate will stabilize. -We'll continue Coumadin per pharmacy. Currently her INR is 2.1  Hyponatremia: - This most likely secondary to dehydration. She is on indapamide which is a diuretic. I will stop this. We'll start her on IV fluids check a basic metabolic panel in the morning. I will go ahead also get a urinary sodium urinary creatinine and urine osmolarity. -Calculate fractional excretion of sodium compare urine and blood osmolarity.  HYPERTENSION -we'll hold her diuretic continue her metoprolol as she seems to be clinically dehydrated. Her blood pressures currently. borderline  high.  Diabetes mellitus type 2: -Check hemoglobin A1c, cut her insulin home dose in half and start her on sliding scale insulin. -Check CBGs  a.c. and at bedtime.  Code Status:  Full Family Communication:none Disposition Plan: 2-3 days (indicate anticipated LOS)  Time spent: 60 minute  Marinda Elk [facility] Pager [phone]  If 7PM-7AM, please contact night-coverage [email or web address] Password [email or web address] 06/15/2012, 3:25 PM

## 2012-06-15 NOTE — Progress Notes (Signed)
ANTICOAGULATION CONSULT NOTE - Initial Consult  Pharmacy Consult for Warfarin Indication: Afib  Allergies  Allergen Reactions  . Insulin Glargine     Itching   . Latex     Rash   . Metformin And Related     Severe diarrhea   . Metoclopramide Hcl     REACTION: unspecified  . Nsaids     REACTION: unspecified  . Promethazine Hcl     REACTION: unspecified    Patient Measurements: Height: 5\' 7"  (170.2 cm) Weight: 245 lb 13 oz (111.5 kg) IBW/kg (Calculated) : 61.6   Vital Signs: Temp: 98.2 F (36.8 C) (11/15 1620) Temp src: Oral (11/15 1620) BP: 111/49 mmHg (11/15 1620) Pulse Rate: 59  (11/15 1620)  Labs:  Basename 06/15/12 1357 06/15/12 1105  HGB -- 12.3  HCT -- 35.2*  PLT -- 309  APTT -- --  LABPROT 22.8* --  INR 2.11* --  HEPARINUNFRC -- --  CREATININE -- 0.99  CKTOTAL -- --  CKMB -- --  TROPONINI -- --    Estimated Creatinine Clearance: 51.6 ml/min (by C-G formula based on Cr of 0.99).   Medical History: Past Medical History  Diagnosis Date  . Atrial fibrillation   . Diabetes mellitus     type II  . Anemia     Fe deficiency  . GERD (gastroesophageal reflux disease)   . Hyperlipidemia   . Hypertension   . Hypothyroid   . Obesity   . Vaginal dysplasia 1980  . Rosacea   . Chest pain 11/2002    cardiac cath neg   . Back pain   . IBS (irritable bowel syndrome)     with diarrhea  . Asthma     as a child  . Adenomatous colon polyp 12/2007  . Hemorrhoids   . Vertigo   . Hiatal hernia 12/2007    EGD    Medications:  Scheduled:    . [COMPLETED] acetaminophen  1,000 mg Oral Once  . acetaminophen  500 mg Oral BID  . [COMPLETED] cefTRIAXone (ROCEPHIN) IVPB 1 gram/50 mL D5W  1 g Intravenous Once  . insulin aspart  0-20 Units Subcutaneous TID WC  . insulin aspart  0-5 Units Subcutaneous QHS  . insulin aspart  6 Units Subcutaneous TID WC  . insulin detemir  30 Units Subcutaneous Q breakfast  . levothyroxine  150 mcg Oral Daily  . [COMPLETED]  metoprolol  5 mg Intravenous Once  . metoprolol  25 mg Oral BID  . simvastatin  40 mg Oral QHS  . [COMPLETED] sodium chloride  1,000 mL Intravenous Once  . [COMPLETED] sodium chloride  1,000 mL Intravenous Once  . sodium chloride  3 mL Intravenous Q12H  . [COMPLETED] sodium chloride  250 mL Intravenous Once  . [DISCONTINUED] warfarin  2.5 mg Oral QPM   Infusions:    . sodium chloride 125 mL/hr at 06/15/12 1628  . sodium chloride      Assessment:  [name] on wafarin PTA for afib with RVR.  Admit 11/15 with sepsis  PTA warfarin dose:  5mg  Tues, Thurs, Sat, 2.5mg  on Mon, Wed, Fri, Sun.  Last dose taken 11/13, appears to have missed dose on 11/14.  INR therapeutic on admission at 2.11  CBC wnl  Goal of Therapy:  INR 2-3 Monitor platelets by anticoagulation protocol: Yes   Plan:   Warfarin 2.5mg  PO x1 dose at 1800  Daily INR, CBC   Lynann Beaver PharmD, BCPS Pager [phone] 06/15/2012 5:16 PM

## 2012-06-15 NOTE — ED Notes (Signed)
Patient states she is allergic to latexKendal Blackwell, tech to OR to pick up latex free foley catheter

## 2012-06-15 NOTE — ED Notes (Signed)
Pt in from home by ems. Pt reports foul smelling urine, incontinence. Mental status changes reported by family, which is similar to previous episodes of uti. Pt has Hx Afib, currently in a fib, 112-115. cbg 86.

## 2012-06-15 NOTE — ED Notes (Signed)
Upon arrival patient was saturated with urine. Patient was cleaned and provided with dry linen.

## 2012-06-15 NOTE — ED Provider Notes (Signed)
History     CSN: [id number]  Arrival date & time 06/15/12  1036   First MD Initiated Contact with Patient 06/15/12 1055      Chief Complaint  Patient presents with  . Urinary Tract Infection    (Consider location/radiation/quality/duration/timing/severity/associated sxs/prior treatment) HPI Comments: [name] is a 78 y.o. Female who has been ill for one week with cough. Yesterday, she became confused, so, her son, was worried about a urinary tract infection. She has had confusion with UTI in the past. She's had decreased appetite for one day. No reported fever, chills, nausea, vomiting. She is too weak to sit up today. She lives in her own home. Her son, is here with her. She denies recent illness. There are no known alleviating factors  Patient is a 78 y.o. female presenting with urinary tract infection. The history is provided by the patient.  Urinary Tract Infection    Past Medical History  Diagnosis Date  . Atrial fibrillation   . Diabetes mellitus     type II  . Anemia     Fe deficiency  . GERD (gastroesophageal reflux disease)   . Hyperlipidemia   . Hypertension   . Hypothyroid   . Obesity   . Vaginal dysplasia 1980  . Rosacea   . Chest pain 11/2002    cardiac cath neg   . Back pain   . IBS (irritable bowel syndrome)     with diarrhea  . Asthma     as a child  . Adenomatous colon polyp 12/2007  . Hemorrhoids   . Vertigo   . Hiatal hernia 12/2007    EGD    Past Surgical History  Procedure Date  . Abdominal hysterectomy 1963  . Cholecystectomy 2006  . Knee surgery     Rt TKR 1993 & Lt TKR 2001  . Cervical cone biopsy 1963    D&C  . Appendectomy 1941  . Cystocele repair 1978    /rectocele  . Orif ankle fracture 01/2010    fall/followed by rehab stay  . Cataract extraction 2006    bilateral    Family History  Problem Relation Age of Onset  . Cancer Mother     leukemia  . Coronary artery disease Mother   . Dementia Mother   . Heart  disease Father     CAD  . COPD Sister   . Heart disease Brother   . Diabetes Brother   . Kidney disease Son   . Colon cancer Brother     History  Substance Use Topics  . Smoking status: Former Games developer  . Smokeless tobacco: Never Used  . Alcohol Use: No    OB History    Grav Para Term Preterm Abortions TAB SAB Ect Mult Living                  Review of Systems  All other systems reviewed and are negative.    Allergies  Insulin glargine; Latex; Metformin and related; Metoclopramide hcl; Nsaids; and Promethazine hcl  Home Medications   No current outpatient prescriptions on file.  BP 111/49  Pulse 130  Temp 98.2 F (36.8 C) (Oral)  Resp 22  Ht 5\' 7"  (1.702 m)  Wt 245 lb 13 oz (111.5 kg)  BMI 38.50 kg/m2  SpO2 94%  LMP 09/01/1961  Physical Exam  Nursing note and vitals reviewed. Constitutional: She is oriented to person, place, and time. She appears well-developed. She appears distressed (Mild).  Obese, elderly. She is globally weak, she is unable to sit up on her own.  HENT:  Head: Normocephalic and atraumatic.  Eyes: Conjunctivae normal and EOM are normal. Pupils are equal, round, and reactive to light.  Neck: Normal range of motion and phonation normal. Neck supple.  Cardiovascular: Intact distal pulses.        Tachycardic, irregular rhythm  Pulmonary/Chest: Breath sounds normal. She is in respiratory distress (Cough). She exhibits no tenderness.  Abdominal: Soft. She exhibits no distension. There is no tenderness. There is no guarding.  Musculoskeletal: Normal range of motion.  Neurological: She is alert and oriented to person, place, and time. She has normal strength. She exhibits normal muscle tone.  Skin: Skin is warm and dry.  Psychiatric: She has a normal mood and affect. Her behavior is normal. Judgment and thought content normal.    ED Course  Procedures (including critical care time) Initial assessment: Dehydration, with secondary  tachycardia, and weakness. Likely UTI as source.   Initial stabilization measures, IV fluid bolus, and drip. IV, Lopressor, IV, Rocephin    14:20- patient is resting comfortably. Heart rate improved at 105-110 after IV fluids, and Lopressor. She has been treated with Rocephin for UTI. BP 119/78      Date: 05/18/2012  Rate: 143  Rhythm: atrial fibrillation  QRS Axis: normal  PR and QT Intervals: QT normal  ST/T Wave abnormalities: normal  PR and QRS Conduction Disutrbances:QT normal  Narrative Interpretation:   Old EKG Reviewed: changes noted- rate faster   CRITICAL CARE Performed by: Mancel Bale L   Total critical care time: 35 minutes  Critical care time was exclusive of separately billable procedures and treating other patients.  Critical care was necessary to treat or prevent imminent or life-threatening deterioration.  Critical care was time spent personally by me on the following activities: development of treatment plan with patient and/or surrogate as well as nursing, discussions with consultants, evaluation of patient's response to treatment, examination of patient, obtaining history from patient or surrogate, ordering and performing treatments and interventions, ordering and review of laboratory studies, ordering and review of radiographic studies, pulse oximetry and re-evaluation of patient's condition.   Labs Reviewed  CBC WITH DIFFERENTIAL - Abnormal; Notable for the following:    WBC 22.7 (*)     HCT 35.2 (*)     Neutrophils Relative 84 (*)     Neutro Abs 19.1 (*)     Lymphocytes Relative 7 (*)     Monocytes Absolute 2.0 (*)     All other components within normal limits  BASIC METABOLIC PANEL - Abnormal; Notable for the following:    Sodium 131 (*)     Chloride 92 (*)     GFR calc non Af Amer 50 (*)     GFR calc Af Amer 58 (*)     All other components within normal limits  URINALYSIS, ROUTINE W REFLEX MICROSCOPIC - Abnormal; Notable for the following:     Color, Urine AMBER (*)  BIOCHEMICALS MAY BE AFFECTED BY COLOR   APPearance CLOUDY (*)     Hgb urine dipstick SMALL (*)     Bilirubin Urine SMALL (*)     Ketones, ur 15 (*)     Protein, ur 100 (*)     Leukocytes, UA SMALL (*)     All other components within normal limits  PROTIME-INR - Abnormal; Notable for the following:    Prothrombin Time 22.8 (*)     INR 2.11 (*)  All other components within normal limits  URINE MICROSCOPIC-ADD ON - Abnormal; Notable for the following:    Squamous Epithelial / LPF FEW (*)     Bacteria, UA FEW (*)     All other components within normal limits  GLUCOSE, CAPILLARY - Abnormal; Notable for the following:    Glucose-Capillary 122 (*)     All other components within normal limits  URINE CULTURE  URINALYSIS, ROUTINE W REFLEX MICROSCOPIC  TSH  HEMOGLOBIN A1C  CULTURE, BLOOD (ROUTINE X 2)  CULTURE, BLOOD (ROUTINE X 2)  CREATININE, URINE, RANDOM  SODIUM, URINE, RANDOM  OSMOLALITY, URINE  PROTIME-INR  CBC  COMPREHENSIVE METABOLIC PANEL   Dg Chest 2 View  06/15/2012  *RADIOLOGY REPORT*  Clinical Data: Shortness of breath.  Weakness.  Urinary tract infection.  CHEST - 2 VIEW  Comparison: 10/15/2011  Findings: Low lung volumes seen however both lungs are clear. Heart size is stable within normal limits.  No evidence of pleural effusion.  IMPRESSION: Low lung volumes.  No acute findings.   Original Report Authenticated By: Myles Rosenthal, M.D.      1. UTI (lower urinary tract infection)   2. Atrial fibrillation with RVR   3. Dehydration   4. Sepsis(995.91)   5. DM type 2 with diabetic peripheral neuropathy   6. Metabolic encephalopathy       MDM  UTI with dehydration; no finding for severe sepsis. She is fluid responsive. She needs admission for stabilization.       Flint Melter, MD 06/15/12 [phone]

## 2012-06-15 NOTE — ED Notes (Signed)
ZOX:WRUE<AV> Expected date:06/15/12<BR> Expected time:10:13 AM<BR> Means of arrival:Ambulance<BR> Comments:<BR> AMS, UTI symptoms

## 2012-06-15 NOTE — Telephone Encounter (Signed)
Lady from [facility] left v/m; pts daughter has requested a hospice referral. [facility] wants to know if Dr Milinda Antis would be the attending physician and if so would Dr Milinda Antis want Hospice doctor to help with symptom management.Please advise.

## 2012-06-15 NOTE — Telephone Encounter (Signed)
I know she was in the hospital for sepsis- I did not see a discharge summary- is she out yet? - I wanted to review it to answer that question

## 2012-06-15 NOTE — Telephone Encounter (Signed)
Pt still in hospital, will call on monday and let Josephene from hospice know you want to get d/c summery first

## 2012-06-16 ENCOUNTER — Inpatient Hospital Stay (HOSPITAL_COMMUNITY): Payer: Medicare Other

## 2012-06-16 DIAGNOSIS — R5381 Other malaise: Secondary | ICD-10-CM

## 2012-06-16 DIAGNOSIS — E871 Hypo-osmolality and hyponatremia: Secondary | ICD-10-CM

## 2012-06-16 LAB — URINE CULTURE

## 2012-06-16 LAB — COMPREHENSIVE METABOLIC PANEL
ALT: 11 U/L (ref 0–35)
AST: 19 U/L (ref 0–37)
Albumin: 2.3 g/dL — ABNORMAL LOW (ref 3.5–5.2)
Alkaline Phosphatase: 60 U/L (ref 39–117)
Calcium: 8.3 mg/dL — ABNORMAL LOW (ref 8.4–10.5)
Glucose, Bld: 243 mg/dL — ABNORMAL HIGH (ref 70–99)
Potassium: 3.6 mEq/L (ref 3.5–5.1)
Sodium: 133 mEq/L — ABNORMAL LOW (ref 135–145)
Total Protein: 6.9 g/dL (ref 6.0–8.3)

## 2012-06-16 LAB — MRSA PCR SCREENING: MRSA by PCR: NEGATIVE

## 2012-06-16 LAB — CBC
HCT: 34.2 % — ABNORMAL LOW (ref 36.0–46.0)
MCHC: 33.3 g/dL (ref 30.0–36.0)
MCV: 92.2 fL (ref 78.0–100.0)
Platelets: 245 10*3/uL (ref 150–400)
RDW: 13.7 % (ref 11.5–15.5)

## 2012-06-16 LAB — PROTIME-INR
INR: 1.75 — ABNORMAL HIGH (ref 0.00–1.49)
Prothrombin Time: 19.8 seconds — ABNORMAL HIGH (ref 11.6–15.2)

## 2012-06-16 LAB — CREATININE, URINE, RANDOM: Creatinine, Urine: 133.8 mg/dL

## 2012-06-16 LAB — HEMOGLOBIN A1C
Hgb A1c MFr Bld: 7.9 % — ABNORMAL HIGH (ref <5.7)
Mean Plasma Glucose: 180 mg/dL — ABNORMAL HIGH (ref <117)

## 2012-06-16 LAB — STREP PNEUMONIAE URINARY ANTIGEN: Strep Pneumo Urinary Antigen: NEGATIVE

## 2012-06-16 LAB — PRO B NATRIURETIC PEPTIDE: Pro B Natriuretic peptide (BNP): 3124 pg/mL — ABNORMAL HIGH (ref 0–450)

## 2012-06-16 LAB — GLUCOSE, CAPILLARY: Glucose-Capillary: 156 mg/dL — ABNORMAL HIGH (ref 70–99)

## 2012-06-16 MED ORDER — IPRATROPIUM BROMIDE 0.02 % IN SOLN
0.5000 mg | Freq: Four times a day (QID) | RESPIRATORY_TRACT | Status: DC
  Administered 2012-06-17 – 2012-06-21 (×15): 0.5 mg via RESPIRATORY_TRACT
  Filled 2012-06-16 (×14): qty 2.5

## 2012-06-16 MED ORDER — WARFARIN SODIUM 5 MG PO TABS
5.0000 mg | ORAL_TABLET | Freq: Once | ORAL | Status: AC
  Administered 2012-06-16: 5 mg via ORAL
  Filled 2012-06-16: qty 1

## 2012-06-16 MED ORDER — LEVALBUTEROL HCL 0.63 MG/3ML IN NEBU
0.6300 mg | INHALATION_SOLUTION | Freq: Four times a day (QID) | RESPIRATORY_TRACT | Status: DC
  Administered 2012-06-17 – 2012-06-21 (×15): 0.63 mg via RESPIRATORY_TRACT
  Filled 2012-06-16 (×22): qty 3

## 2012-06-16 MED ORDER — METOPROLOL TARTRATE 1 MG/ML IV SOLN: 5.0000 mg | Freq: Once | INTRAVENOUS | Status: DC

## 2012-06-16 MED ORDER — FUROSEMIDE 10 MG/ML IJ SOLN
40.0000 mg | Freq: Once | INTRAMUSCULAR | Status: AC
  Administered 2012-06-16: 40 mg via INTRAVENOUS
  Filled 2012-06-16: qty 4

## 2012-06-16 MED ORDER — METHYLPREDNISOLONE SODIUM SUCC 125 MG IJ SOLR
60.0000 mg | Freq: Once | INTRAMUSCULAR | Status: AC
  Administered 2012-06-16: 60 mg via INTRAVENOUS
  Filled 2012-06-16: qty 0.96

## 2012-06-16 MED ORDER — IOHEXOL 300 MG/ML  SOLN
100.0000 mL | Freq: Once | INTRAMUSCULAR | Status: AC | PRN
  Administered 2012-06-16: 100 mL via INTRAVENOUS

## 2012-06-16 MED ORDER — LEVALBUTEROL HCL 0.63 MG/3ML IN NEBU
0.6300 mg | INHALATION_SOLUTION | RESPIRATORY_TRACT | Status: DC | PRN
  Filled 2012-06-16: qty 3

## 2012-06-16 MED ORDER — DEXTROSE 5 % IV SOLN
1.0000 g | Freq: Every day | INTRAVENOUS | Status: DC
  Administered 2012-06-16: 1 g via INTRAVENOUS
  Filled 2012-06-16: qty 10

## 2012-06-16 MED ORDER — ALBUTEROL SULFATE (5 MG/ML) 0.5% IN NEBU
2.5000 mg | INHALATION_SOLUTION | RESPIRATORY_TRACT | Status: DC
  Administered 2012-06-16: 2.5 mg via RESPIRATORY_TRACT
  Filled 2012-06-16: qty 0.5

## 2012-06-16 MED ORDER — LEVALBUTEROL HCL 0.63 MG/3ML IN NEBU
0.6300 mg | INHALATION_SOLUTION | Freq: Four times a day (QID) | RESPIRATORY_TRACT | Status: DC | PRN
  Filled 2012-06-16 (×2): qty 3

## 2012-06-16 MED ORDER — DEXTROSE 5 % IV SOLN
500.0000 mg | INTRAVENOUS | Status: DC
  Administered 2012-06-16: 500 mg via INTRAVENOUS
  Filled 2012-06-16 (×2): qty 500

## 2012-06-16 MED ORDER — IPRATROPIUM BROMIDE 0.02 % IN SOLN
0.5000 mg | RESPIRATORY_TRACT | Status: DC
  Administered 2012-06-16: 0.5 mg via RESPIRATORY_TRACT
  Filled 2012-06-16: qty 2.5

## 2012-06-16 NOTE — Progress Notes (Signed)
Gave report to stepdown nurse

## 2012-06-16 NOTE — Progress Notes (Signed)
Per radiology, recommending a CT due to Chest x-ray results.

## 2012-06-16 NOTE — Progress Notes (Addendum)
Inspiratory and expiratory wheezing increasing since this am.

## 2012-06-16 NOTE — Progress Notes (Signed)
ANTIBIOTIC CONSULT NOTE - INITIAL  Pharmacy Consult for Ceftriaxone Indication: pneumonia  Allergies  Allergen Reactions  . Insulin Glargine     Itching   . Latex     Rash   . Metformin And Related     Severe diarrhea   . Metoclopramide Hcl     REACTION: unspecified  . Nsaids     REACTION: unspecified  . Promethazine Hcl     REACTION: unspecified    Patient Measurements: Height: 5\' 7"  (170.2 cm) Weight: 245 lb 13 oz (111.5 kg) IBW/kg (Calculated) : 61.6   Vital Signs: Temp: 99.4 F (37.4 C) (11/16 1459) Temp src: Oral (11/16 1459) BP: 118/49 mmHg (11/16 1459) Pulse Rate: 67  (11/16 1459) Intake/Output from previous day: 11/15 0701 - 11/16 0700 In: 2965.3 [P.O.:200; I.V.:2765.3] Out: 995 [Urine:995]  Labs:  [facility] 06/16/12 0930 06/16/12 0635 06/15/12 1629 06/15/12 1105  WBC -- 24.5* -- 22.7*  HGB -- 11.4* -- 12.3  PLT -- 245 -- 309  LABCREA -- -- 133.8 --  CREATININE 0.95 -- -- 0.99   Estimated Creatinine Clearance: 53.7 ml/min (by C-G formula based on Cr of 0.95).   Microbiology: Recent Results (from the past 720 hour(s))  CULTURE, BLOOD (ROUTINE X 2)     Status: Normal (Preliminary result)   Collection Time   06/15/12  4:30 PM      Component Value Range Status Comment   Specimen Description BLOOD LEFT HAND   Final    Special Requests BOTTLES DRAWN AEROBIC ONLY 10CC   Final    Culture  Setup Time 06/16/2012 01:07   Final    Culture     Final    Value: GRAM POSITIVE COCCI IN CHAINS     Note: Gram Stain Report Called to,Read Back By and Verified With: DAWN LARSON 06/16/12 @ 2:40PM BY RUSCA.   Report Status PENDING   Incomplete     Medical History: Past Medical History  Diagnosis Date  . Atrial fibrillation   . Diabetes mellitus     type II  . Anemia     Fe deficiency  . GERD (gastroesophageal reflux disease)   . Hyperlipidemia   . Hypertension   . Hypothyroid   . Obesity   . Vaginal dysplasia 1980  . Rosacea   . Chest pain 11/2002   cardiac cath neg   . Back pain   . IBS (irritable bowel syndrome)     with diarrhea  . Asthma     as a child  . Adenomatous colon polyp 12/2007  . Hemorrhoids   . Vertigo   . Hiatal hernia 12/2007    EGD    Medications:  Anti-infectives     Start     Dose/Rate Route Frequency Ordered Stop   06/16/12 1300   azithromycin (ZITHROMAX) 500 mg in dextrose 5 % 250 mL IVPB        500 mg 250 mL/hr over 60 Minutes Intravenous Every 24 hours 06/16/12 1203     06/15/12 1130   cefTRIAXone (ROCEPHIN) 1 g in dextrose 5 % 50 mL IVPB        1 g 100 mL/hr over 30 Minutes Intravenous  Once 06/15/12 1126 06/15/12 1356         Assessment:  89 YOF admit 11/15 with sepsis, suspected UTI/ pneumonia  First dose of ceftriaxone given 11/15 in ED and first azithromycin given 11/16.  WBC 24, Tm 102.9, CrCl (n) ~ 47 ml/min  Blood cultures: GPC chains, urine  culture in process  Goal of Therapy:  Appropriate abx dosing, eradication of infection  Plan:   Ceftriaxone 1g IV q24h.  Continue azithromycin 500mg  IV q24h  Follow up labs and culture results.   Lynann Beaver PharmD, BCPS Pager [phone] 06/16/2012 6:39 PM

## 2012-06-16 NOTE — Progress Notes (Addendum)
CRITICAL VALUE ALERT  Critical value received:  Aerobic Blood Culture, gram positive cocci and strains  Date of notification:  06/16/2012  Time of notification:  1343  Critical value read back:yes  Nurse who received alert:  Dub Mikes  MD notified (1st page):  Blake Divine    Time of first page:  1725  MD notified (2nd page):  Time of second page:  Responding MD:  Blake Divine     Time MD responded:  1758

## 2012-06-16 NOTE — Progress Notes (Signed)
Administered lopressor due to elevated HR in the 160-180's.

## 2012-06-16 NOTE — Progress Notes (Signed)
[facility] PROGRESS NOTE  [name] ZOX:[id number] DOB: 08/23/1922 DOA: 06/15/2012 PCP: [name], MD  Assessment/Plan: 1. CAP: started her on rocephin and zithromax.  2. Sepsis; blood cultures grew gram positive cocci in chains. On rocephin. Will await identification of the organisms and repeat cultures.  3. Metabolic encephalopathy: improved.  4. UTI: Cultures pending. On rocephin.  5. Atrial fibrillation with RVR  - That she was well controlled with low-dose metoprolol and 1 L of normal saline. We'll go ahead and give her another liter of normal saline. Continue current 125 normal saline IV. Will use metoprolol for heart rate greater than 110. We'll admit her to telemetry. We'll start her on home dose of metoprolol. I am guessing that 1 her decreased intravascular volume is repleted her heart rate will stabilize.  -We'll continue Coumadin per pharmacy. Currently her INR is 2.1  6.Hyponatremia:  - This most likely secondary to dehydration. She is on indapamide which is a diuretic. I will stop this. We'll start her on IV fluids check a basic metabolic panel in the morning. I will go ahead also get a urinary sodium urinary creatinine and urine osmolarity.  -Calculate fractional excretion of sodium compare urine and blood osmolarity.  7.HYPERTENSION  -we'll hold her diuretic continue her metoprolol as she seems to be clinically dehydrated. Her blood pressures currently. borderline high.  8. Diabetes mellitus type 2:  -Check hemoglobin A1c, cut her insulin home dose in half and start her on sliding scale insulin.  -Check CBGs a.c. and at bedtime.      Code Status: full code Family Communication: none at bedside Disposition Plan: 2 TO 3 DAYS. Pending PT.OT eval.    Antibiotics:  ROCEPHIN AND ZITHROMAX 11/16  HPI/Subjective: IN [employer].   Objective: Filed Vitals:   06/15/12 2107 06/16/12 0500 06/16/12 0852 06/16/12 1459  BP: 122/47 135/61  118/49  Pulse: 97  112 144 67  Temp: 98.7 F (37.1 C) 98.6 F (37 C)  99.4 F (37.4 C)  TempSrc: Oral Oral  Oral  Resp:  22  20  Height:      Weight:      SpO2: 99% 99%  98%    Intake/Output Summary (Last 24 hours) at 06/16/12 1824 Last data filed at 06/16/12 1449  Gross per 24 hour  Intake 2109.5 ml  Output   1300 ml  Net  809.5 ml   Filed Weights   06/15/12 1622  Weight: 111.5 kg (245 lb 13 oz)    Exam:   General:  Alert afebrile comfortable  Cardiovascular: s1s2 irregular.  Respiratory: scattered exp wheezing, scattered rhonchi, decreased air entry at bases  Abdomen: soft NT ND BS+  Data Reviewed: Basic Metabolic Panel:  Lab 06/16/12 8119 06/15/12 1105  NA 133* 131*  K 3.6 3.9  CL 97 92*  CO2 21 24  GLUCOSE 243* 90  BUN 20 18  CREATININE 0.95 0.99  CALCIUM 8.3* 9.3  MG -- --  PHOS -- --   Liver Function Tests:  Lab 06/16/12 0930  AST 19  ALT 11  ALKPHOS 60  BILITOT 0.7  PROT 6.9  ALBUMIN 2.3*   No results found for this basename: LIPASE:5,AMYLASE:5 in the last 168 hours No results found for this basename: AMMONIA:5 in the last 168 hours CBC:  Lab 06/16/12 0635 06/15/12 1105  WBC 24.5* 22.7*  NEUTROABS -- 19.1*  HGB 11.4* 12.3  HCT 34.2* 35.2*  MCV 92.2 90.7  PLT 245 309   Cardiac Enzymes:  No results found for this basename: CKTOTAL:5,CKMB:5,CKMBINDEX:5,TROPONINI:5 in the last 168 hours BNP (last 3 results)  Basename 10/15/11 1202  PROBNP 1234.0*   CBG:  Lab 06/16/12 1708 06/16/12 1159 06/16/12 0744 06/15/12 2142 06/15/12 1656  GLUCAP 156* 199* 147* 211* 122*    Recent Results (from the past 240 hour(s))  CULTURE, BLOOD (ROUTINE X 2)     Status: Normal (Preliminary result)   Collection Time   06/15/12  4:30 PM      Component Value Range Status Comment   Specimen Description BLOOD LEFT HAND   Final    Special Requests BOTTLES DRAWN AEROBIC ONLY 10CC   Final    Culture  Setup Time 06/16/2012 01:07   Final    Culture     Final    Value: GRAM  POSITIVE COCCI IN CHAINS     Note: Gram Stain Report Called to,Read Back By and Verified With: DAWN LARSON 06/16/12 @ 2:40PM BY RUSCA.   Report Status PENDING   Incomplete      Studies: Dg Chest 2 View  06/15/2012  *RADIOLOGY REPORT*  Clinical Data: Shortness of breath.  Weakness.  Urinary tract infection.  CHEST - 2 VIEW  Comparison: 10/15/2011  Findings: Low lung volumes seen however both lungs are clear. Heart size is stable within normal limits.  No evidence of pleural effusion.  IMPRESSION: Low lung volumes.  No acute findings.   Original Report Authenticated By: Myles Rosenthal, M.D.    Ct Chest W Contrast  06/16/2012  *RADIOLOGY REPORT*  Clinical Data: Cough and crackles.  Leukocytosis.  CT CHEST WITH CONTRAST  Technique:  Multidetector CT imaging of the chest was performed following the standard protocol during bolus administration of intravenous contrast.  Contrast: OMNIPAQUE IOHEXOL 300 MG/ML  SOLN  Comparison: Chest CT 01/14/2005.  Findings:  Mediastinum: Heart size is mildly enlarged. There is no significant pericardial fluid, thickening or pericardial calcification. There is atherosclerosis of the thoracic aorta, the great vessels of the mediastinum and the coronary arteries, including calcified atherosclerotic plaque in the left main, left anterior descending and right coronary arteries. There are numerous borderline enlarged and mildly enlarged mediastinal lymph nodes, largest of which measures up to 1.2 cm in short axis in the right paratracheal station.  Esophagus is unremarkable in appearance.  Lungs/Pleura: Extensive air space consolidation is noted in the apex of the left upper lobe and the posterior aspect of the right middle lobe, concerning for multilobar pneumonia.  Mild diffuse bronchial wall thickening is noted.  Trace bilateral pleural effusions and some dependent atelectasis in the lower lobes of the lungs bilaterally.  Mild bilateral lower lobe cylindrical bronchiectasis. No  definite suspicious appearing pulmonary nodules.  Upper Abdomen: Linear areas of ill-defined low attenuation in the liver adjacent to the fissure for the ligamentum venosum is likely to represent focal fatty infiltration.  Nonspecific stranding in the left upper quadrant adjacent to the upper pole of the left kidney.  Musculoskeletal: There are no aggressive appearing lytic or blastic lesions noted in the visualized portions of the skeleton.  IMPRESSION: 1.  Multilobar pneumonia in the apex of the left upper lobe and posterior aspect of the right middle lobe (lateral segment). 2.  Trace bilateral pleural effusions. 3.  Mild cylindrical bronchiectasis in the lower lobes of the lungs bilaterally. 4.  Nonspecific stranding in the left upper quadrant of the abdomen.  This could be related to baseline renal insufficiency, however, clinical correlation for signs and symptoms of pancreatitis may be  warranted.  If there is any elevation of lipase levels, further evaluation with dedicated abdominal and pelvic imaging may be warranted.  5.  Atherosclerosis, including left main and two-vessel coronary artery disease.   Original Report Authenticated By: Trudie Reed, M.D.    Portable Chest 1 View  06/16/2012  *RADIOLOGY REPORT*  Clinical Data: Cough and congestion.  PORTABLE CHEST - 1 VIEW  Comparison: 06/15/2012  Findings: Semi upright portable view of the chest was obtained. The patient continues to have slightly low lung volumes.  There are asymmetric densities in the left upper lung and apex region.  There is concern for airspace disease and cannot exclude a lesion in this area.  There are streaky densities at the left lung base and right mid lung.  Heart and mediastinum are within normal limits.  The thoracic aorta is heavily calcified.  IMPRESSION: There is concern for patchy parenchymal disease that could represent airspace disease and infection.  In particular, there is concern for opacities in the left apical  region. This area could be further evaluated with a chest CT to exclude an underlying lesion.  These results will be called to the ordering clinician or representative by the Radiologist Assistant, and communication documented in the PACS Dashboard.   Original Report Authenticated By: Richarda Overlie, M.D.     Scheduled Meds:   . acetaminophen  500 mg Oral BID  . ipratropium  0.5 mg Nebulization Q4H   And  . albuterol  2.5 mg Nebulization Q4H  . azithromycin  500 mg Intravenous Q24H  . insulin aspart  0-20 Units Subcutaneous TID WC  . insulin aspart  0-5 Units Subcutaneous QHS  . insulin aspart  6 Units Subcutaneous TID WC  . insulin detemir  30 Units Subcutaneous Q breakfast  . levothyroxine  150 mcg Oral Daily  . metoprolol  25 mg Oral BID  . simvastatin  40 mg Oral QHS  . sodium chloride  3 mL Intravenous Q12H  . [COMPLETED] warfarin  5 mg Oral ONCE-1800  . Warfarin - Pharmacist Dosing Inpatient   Does not apply q1800   Continuous Infusions:   . sodium chloride 10 mL/hr at 06/16/12 0618    Principal Problem:  *Sepsis(995.91) Active Problems:  HYPERTENSION  UTI (lower urinary tract infection)  Metabolic encephalopathy  Atrial fibrillation with RVR  Hyponatremia       [name]  [facility] Pager [phone] If 8PM-8AM, please contact night-coverage at [email or web address], password [facility] 06/16/2012, 6:24 PM  LOS: 1 day

## 2012-06-16 NOTE — Progress Notes (Signed)
ANTICOAGULATION CONSULT NOTE - Follow Up  Pharmacy Consult for Warfarin Indication: Afib  Allergies  Allergen Reactions  . Insulin Glargine     Itching   . Latex     Rash   . Metformin And Related     Severe diarrhea   . Metoclopramide Hcl     REACTION: unspecified  . Nsaids     REACTION: unspecified  . Promethazine Hcl     REACTION: unspecified    Patient Measurements: Height: 5\' 7"  (170.2 cm) Weight: 245 lb 13 oz (111.5 kg) IBW/kg (Calculated) : 61.6   Vital Signs: Temp: 98.6 F (37 C) (11/16 0500) Temp src: Oral (11/16 0500) BP: 135/61 mmHg (11/16 0500) Pulse Rate: 144  (11/16 0852)  Labs:  Basename 06/16/12 0930 06/16/12 0929 06/16/12 0635 06/15/12 1357 06/15/12 1105  HGB -- -- 11.4* -- 12.3  HCT -- -- 34.2* -- 35.2*  PLT -- -- 245 -- 309  APTT -- -- -- -- --  LABPROT -- 19.8* -- 22.8* --  INR -- 1.75* -- 2.11* --  HEPARINUNFRC -- -- -- -- --  CREATININE 0.95 -- -- -- 0.99  CKTOTAL -- -- -- -- --  CKMB -- -- -- -- --  TROPONINI -- -- -- -- --    Estimated Creatinine Clearance: 53.7 ml/min (by C-G formula based on Cr of 0.95).  Medications:  Scheduled:     . [COMPLETED] acetaminophen  1,000 mg Oral Once  . acetaminophen  500 mg Oral BID  . [COMPLETED] cefTRIAXone (ROCEPHIN) IVPB 1 gram/50 mL D5W  1 g Intravenous Once  . insulin aspart  0-20 Units Subcutaneous TID WC  . insulin aspart  0-5 Units Subcutaneous QHS  . insulin aspart  6 Units Subcutaneous TID WC  . insulin detemir  30 Units Subcutaneous Q breakfast  . levothyroxine  150 mcg Oral Daily  . [COMPLETED] metoprolol  5 mg Intravenous Once  . metoprolol  25 mg Oral BID  . simvastatin  40 mg Oral QHS  . [COMPLETED] sodium chloride  1,000 mL Intravenous Once  . [COMPLETED] sodium chloride  1,000 mL Intravenous Once  . sodium chloride  3 mL Intravenous Q12H  . [COMPLETED] warfarin  2.5 mg Oral ONCE-1800  . Warfarin - Pharmacist Dosing Inpatient   Does not apply q1800  . [DISCONTINUED]  metoprolol  25 mg Oral BID  . [COMPLETED] sodium chloride  250 mL Intravenous Once  . [DISCONTINUED] warfarin  2.5 mg Oral QPM   Infusions:     . sodium chloride 10 mL/hr at 06/16/12 0618  . [DISCONTINUED] sodium chloride 125 mL/hr at 06/15/12 1628    Assessment:  89 YOF on wafarin PTA for afib with RVR.  Admit 11/15 with sepsis  PTA warfarin dose:  5mg  Tues, Thurs, Sat, 2.5mg  on Mon, Wed, Fri, Sun.  Last dose taken 11/13, appears to have missed dose on 11/14.  INR was therapeutic on admission at 2.11, but is now subtherapeutic (due to missed 11/14 dose?)  No bleeding reported in chart notes  Goal of Therapy:  INR 2-3 Monitor platelets by anticoagulation protocol: Yes   Plan:   Warfarin 5mg  PO x1 dose at 1800  Daily INR, CBC  Darrol Angel, PharmD Pager: [phone] 06/16/2012 11:31 AM

## 2012-06-16 NOTE — Progress Notes (Signed)
Patients HR trending up.  Giving Lopressor PRN order.

## 2012-06-17 ENCOUNTER — Inpatient Hospital Stay (HOSPITAL_COMMUNITY): Payer: Medicare Other

## 2012-06-17 DIAGNOSIS — D649 Anemia, unspecified: Secondary | ICD-10-CM

## 2012-06-17 DIAGNOSIS — N390 Urinary tract infection, site not specified: Secondary | ICD-10-CM

## 2012-06-17 DIAGNOSIS — J189 Pneumonia, unspecified organism: Secondary | ICD-10-CM

## 2012-06-17 DIAGNOSIS — R7881 Bacteremia: Secondary | ICD-10-CM

## 2012-06-17 DIAGNOSIS — B953 Streptococcus pneumoniae as the cause of diseases classified elsewhere: Secondary | ICD-10-CM

## 2012-06-17 LAB — BASIC METABOLIC PANEL
BUN: 21 mg/dL (ref 6–23)
CO2: 26 mEq/L (ref 19–32)
Calcium: 8.5 mg/dL (ref 8.4–10.5)
Creatinine, Ser: 1.04 mg/dL (ref 0.50–1.10)
Glucose, Bld: 197 mg/dL — ABNORMAL HIGH (ref 70–99)

## 2012-06-17 LAB — HIV ANTIBODY (ROUTINE TESTING W REFLEX): HIV: NONREACTIVE

## 2012-06-17 LAB — CBC
HCT: 32.8 % — ABNORMAL LOW (ref 36.0–46.0)
Hemoglobin: 10.9 g/dL — ABNORMAL LOW (ref 12.0–15.0)
MCH: 30.3 pg (ref 26.0–34.0)
MCV: 91.1 fL (ref 78.0–100.0)
RBC: 3.6 MIL/uL — ABNORMAL LOW (ref 3.87–5.11)

## 2012-06-17 LAB — GLUCOSE, CAPILLARY: Glucose-Capillary: 314 mg/dL — ABNORMAL HIGH (ref 70–99)

## 2012-06-17 LAB — PROTIME-INR
INR: 1.35 (ref 0.00–1.49)
Prothrombin Time: 16.4 seconds — ABNORMAL HIGH (ref 11.6–15.2)

## 2012-06-17 LAB — LEGIONELLA ANTIGEN, URINE: Legionella Antigen, Urine: NEGATIVE

## 2012-06-17 MED ORDER — WARFARIN SODIUM 7.5 MG PO TABS
7.5000 mg | ORAL_TABLET | Freq: Once | ORAL | Status: AC
  Administered 2012-06-17: 7.5 mg via ORAL
  Filled 2012-06-17: qty 1

## 2012-06-17 MED ORDER — PIPERACILLIN-TAZOBACTAM 3.375 G IVPB
3.3750 g | Freq: Three times a day (TID) | INTRAVENOUS | Status: DC
  Administered 2012-06-17 – 2012-06-19 (×7): 3.375 g via INTRAVENOUS
  Filled 2012-06-17 (×8): qty 50

## 2012-06-17 MED ORDER — SODIUM CHLORIDE 0.9 % IJ SOLN
10.0000 mL | INTRAMUSCULAR | Status: DC | PRN
  Administered 2012-06-18 – 2012-06-21 (×4): 10 mL

## 2012-06-17 MED ORDER — FUROSEMIDE 10 MG/ML IJ SOLN
20.0000 mg | Freq: Two times a day (BID) | INTRAMUSCULAR | Status: DC
  Administered 2012-06-17 – 2012-06-19 (×4): 20 mg via INTRAVENOUS
  Filled 2012-06-17 (×7): qty 2

## 2012-06-17 MED ORDER — POTASSIUM CHLORIDE CRYS ER 20 MEQ PO TBCR
40.0000 meq | EXTENDED_RELEASE_TABLET | Freq: Every day | ORAL | Status: AC
  Administered 2012-06-17 – 2012-06-18 (×2): 40 meq via ORAL
  Filled 2012-06-17 (×2): qty 2

## 2012-06-17 MED ORDER — VANCOMYCIN HCL 1000 MG IV SOLR
2000.0000 mg | Freq: Once | INTRAVENOUS | Status: AC
  Administered 2012-06-17: 2000 mg via INTRAVENOUS
  Filled 2012-06-17: qty 2000

## 2012-06-17 MED ORDER — POTASSIUM CHLORIDE 10 MEQ/100ML IV SOLN
10.0000 meq | INTRAVENOUS | Status: AC
  Administered 2012-06-17 (×2): 10 meq via INTRAVENOUS
  Filled 2012-06-17 (×3): qty 100

## 2012-06-17 MED ORDER — VANCOMYCIN HCL 1000 MG IV SOLR
1500.0000 mg | INTRAVENOUS | Status: DC
  Administered 2012-06-18 – 2012-06-19 (×2): 1500 mg via INTRAVENOUS
  Filled 2012-06-17 (×2): qty 1500

## 2012-06-17 MED ORDER — SODIUM CHLORIDE 0.9 % IJ SOLN
10.0000 mL | Freq: Two times a day (BID) | INTRAMUSCULAR | Status: DC
  Administered 2012-06-17: 40 mL
  Administered 2012-06-18 – 2012-06-20 (×2): 10 mL

## 2012-06-17 MED ORDER — METHYLPREDNISOLONE SODIUM SUCC 125 MG IJ SOLR
60.0000 mg | Freq: Two times a day (BID) | INTRAMUSCULAR | Status: DC
  Administered 2012-06-17 – 2012-06-20 (×8): 60 mg via INTRAVENOUS
  Filled 2012-06-17 (×6): qty 0.96
  Filled 2012-06-17: qty 2
  Filled 2012-06-17 (×4): qty 0.96

## 2012-06-17 NOTE — Progress Notes (Signed)
ANTICOAGULATION CONSULT NOTE - Follow Up  Pharmacy Consult for Warfarin Indication: Afib  Allergies  Allergen Reactions  . Insulin Glargine     Itching   . Latex     Rash   . Metformin And Related     Severe diarrhea   . Metoclopramide Hcl     REACTION: unspecified  . Nsaids     REACTION: unspecified  . Promethazine Hcl     REACTION: unspecified    Patient Measurements: Height: 5\' 7"  (170.2 cm) Weight: 245 lb 13 oz (111.5 kg) IBW/kg (Calculated) : 61.6   Vital Signs: Temp: 97 F (36.1 C) (11/17 0400) Temp src: Axillary (11/17 0400) BP: 131/84 mmHg (11/17 0400) Pulse Rate: 90  (11/17 0400)  Labs:  Basename 06/17/12 0400 06/16/12 0930 06/16/12 0929 06/16/12 0635 06/15/12 1357 06/15/12 1105  HGB -- -- -- 11.4* -- 12.3  HCT -- -- -- 34.2* -- 35.2*  PLT -- -- -- 245 -- 309  APTT -- -- -- -- -- --  LABPROT 16.4* -- 19.8* -- 22.8* --  INR 1.35 -- 1.75* -- 2.11* --  HEPARINUNFRC -- -- -- -- -- --  CREATININE -- 0.95 -- -- -- 0.99  CKTOTAL -- -- -- -- -- --  CKMB -- -- -- -- -- --  TROPONINI -- -- -- -- -- --    Estimated Creatinine Clearance: 53.7 ml/min (by C-G formula based on Cr of 0.95).  Medications:  Scheduled:     . acetaminophen  500 mg Oral BID  . azithromycin  500 mg Intravenous Q24H  . cefTRIAXone (ROCEPHIN) IVPB 1 gram/50 mL D5W  1 g Intravenous q1800  . [COMPLETED] furosemide  40 mg Intravenous Once  . insulin aspart  0-20 Units Subcutaneous TID WC  . insulin aspart  0-5 Units Subcutaneous QHS  . insulin aspart  6 Units Subcutaneous TID WC  . insulin detemir  30 Units Subcutaneous Q breakfast  . ipratropium  0.5 mg Nebulization Q6H  . levalbuterol  0.63 mg Nebulization Q6H  . levothyroxine  150 mcg Oral Daily  . [COMPLETED] methylPREDNISolone (SOLU-MEDROL) injection  60 mg Intravenous Once  . metoprolol  5 mg Intravenous Once  . metoprolol  25 mg Oral BID  . simvastatin  40 mg Oral QHS  . sodium chloride  3 mL Intravenous Q12H  .  [COMPLETED] warfarin  5 mg Oral ONCE-1800  . Warfarin - Pharmacist Dosing Inpatient   Does not apply q1800  . [DISCONTINUED] albuterol  2.5 mg Nebulization Q4H  . [DISCONTINUED] ipratropium  0.5 mg Nebulization Q4H   Infusions:     . sodium chloride 10 mL/hr at 06/16/12 1800    Assessment:  89 YOF on wafarin PTA for afib with RVR.  Admit 11/15 with sepsis  PTA warfarin dose:  5mg  Tues, Thurs, Sat, 2.5mg  on Mon, Wed, Fri, Sun.  Last dose taken 11/13, appears to have missed dose on 11/14.  INR was therapeutic on admission at 2.11, but now remains subtherapeutic and continuing to decrease (due to missed 11/14 dose?)  No bleeding reported in chart notes  Drug interaction with azithromycin noted.  Goal of Therapy:  INR 2-3 Monitor platelets by anticoagulation protocol: Yes   Plan:   Warfarin 7.5mg  PO x1 dose at 1800  Daily INR, CBC  Darrol Angel, PharmD Pager: [phone] 06/17/2012 8:25 AM

## 2012-06-17 NOTE — Progress Notes (Signed)
Peripherally Inserted Central Catheter/Midline Placement  The IV Nurse has discussed with the patient and/or persons authorized to consent for the patient, the purpose of this procedure and the potential benefits and risks involved with this procedure.  The benefits include less needle sticks, lab draws from the catheter and patient may be discharged home with the catheter.  Risks include, but not limited to, infection, bleeding, blood clot (thrombus formation), and puncture of an artery; nerve damage and irregular heat beat.  Alternatives to this procedure were also discussed.  PICC/Midline Placement Documentation  PICC / Midline Double Lumen 06/17/12 PICC Right Basilic (Active)       Ethelda Chick 06/17/2012, 6:20 PM

## 2012-06-17 NOTE — Progress Notes (Signed)
[facility] PROGRESS NOTE  [name] VHQ:[id number] DOB: 08-Sep-1922 DOA: 06/15/2012 PCP: [name], MD  Assessment/Plan: 1. CAP/ Gram positive cocci bacteremia/ UTI:  - Patient was transferred to step down yesterday, for respiratory distress and  for closer monitoring.  - she was started on broad spectrum antibiotics vancomycin and zosyn for multilobe pneumonia. Vancomycin should cover the gram positive bacteremia. vanco and zosyn should cover the UTI. We have ordered 2 d Echo to evaluate for possible endocarditis and for evaluation of CHF. Repeated blood cultures pending.   2. Acute respiratory failure: probably from combination of multilobar pneumonia and congestive heart failure.  Her pro bnp is elevated, and she diuresed about 3.5 lit. Started her on IV lasix 20 mg BID and repeat pro bnp in am. Echo pending.   3. Hypokalemia: will be repleted as needed.   4. Metabolic encephalopathy: resolved.   5. Atrial fibrillation with RVR; resolved. Probably from the infection. Continue with metoprolol and coumadin, dosing as per pharmacy.   6. Hyponatremia: improving.   7. Anemia: normocytic. Probably from AOCD.  8. Diabetes Mellitus: CBG (last 3)   Basename 06/17/12 0755 06/16/12 2144 06/16/12 1708  GLUCAP 136* 177* 156*    Resume her home levemir and SSI.   9. DVT prophylaxis: on coumadin.   Code Status: full code Family Communication: none at  bedside Disposition Plan: pending, in 2 days.   Antibiotics:  Rocephin and zithromax 11/15 to 11/16  Vancomycin and zosyn from 11/17>>  HPI/Subjective: FEELING MUCH BETTER. Would like to get out of bed and get a bath.   Objective: Filed Vitals:   06/17/12 0400 06/17/12 0800 06/17/12 0845 06/17/12 0900  BP: 131/84   127/69  Pulse: 90   104  Temp: 97 F (36.1 C) 97.3 F (36.3 C)    TempSrc: Axillary Axillary    Resp: 24   25  Height:      Weight:      SpO2: 96%  95% 99%    Intake/Output Summary (Last 24  hours) at 06/17/12 1048 Last data filed at 06/17/12 0900  Gross per 24 hour  Intake    790 ml  Output   3550 ml  Net  -2760 ml   Filed Weights   06/15/12 1622  Weight: 111.5 kg (245 lb 13 oz)    Exam:   General:  Alert afebrile comfortable  Cardiovascular: s1 s2, slightly tachycardic.   Respiratory: scattered rhonchi and wheezing present.   Abdomen: soft NT ND BS+  Data Reviewed: Basic Metabolic Panel:  Lab 06/16/12 4132 06/15/12 1105  NA 133* 131*  K 3.6 3.9  CL 97 92*  CO2 21 24  GLUCOSE 243* 90  BUN 20 18  CREATININE 0.95 0.99  CALCIUM 8.3* 9.3  MG -- --  PHOS -- --   Liver Function Tests:  Lab 06/16/12 0930  AST 19  ALT 11  ALKPHOS 60  BILITOT 0.7  PROT 6.9  ALBUMIN 2.3*   No results found for this basename: LIPASE:5,AMYLASE:5 in the last 168 hours No results found for this basename: AMMONIA:5 in the last 168 hours CBC:  Lab 06/16/12 0635 06/15/12 1105  WBC 24.5* 22.7*  NEUTROABS -- 19.1*  HGB 11.4* 12.3  HCT 34.2* 35.2*  MCV 92.2 90.7  PLT 245 309   Cardiac Enzymes: No results found for this basename: CKTOTAL:5,CKMB:5,CKMBINDEX:5,TROPONINI:5 in the last 168 hours BNP (last 3 results)  Basename 06/16/12 1948 10/15/11 1202  PROBNP 3124.0* 1234.0*   CBG:  Lab 06/17/12 0755 06/16/12 2144 06/16/12 1708 06/16/12 1159 06/16/12 0744  GLUCAP 136* 177* 156* 199* 147*    Recent Results (from the past 240 hour(s))  URINE CULTURE     Status: Normal   Collection Time   06/15/12 11:04 AM      Component Value Range Status Comment   Specimen Description URINE, CATHETERIZED   Final    Special Requests NONE   Final    Culture  Setup Time 06/15/2012 21:56   Final    Colony Count >=100,000 COLONIES/ML   Final    Culture     Final    Value: Multiple bacterial morphotypes present, none predominant. Suggest appropriate recollection if clinically indicated.   Report Status 06/16/2012 FINAL   Final   CULTURE, BLOOD (ROUTINE X 2)     Status: Normal  (Preliminary result)   Collection Time   06/15/12  4:30 PM      Component Value Range Status Comment   Specimen Description BLOOD LEFT HAND   Final    Special Requests BOTTLES DRAWN AEROBIC ONLY 10CC   Final    Culture  Setup Time 06/16/2012 01:07   Final    Culture     Final    Value: STREPTOCOCCUS SPECIES     Note: Gram Stain Report Called to,Read Back By and Verified With: DAWN LARSON 06/16/12 @ 2:40PM BY RUSCA.   Report Status PENDING   Incomplete   CULTURE, BLOOD (ROUTINE X 2)     Status: Normal (Preliminary result)   Collection Time   06/15/12  4:50 PM      Component Value Range Status Comment   Specimen Description BLOOD RIGHT ARM   Final    Special Requests BOTTLES DRAWN AEROBIC AND ANAEROBIC 10CC   Final    Culture  Setup Time 06/16/2012 01:07   Final    Culture     Final    Value: STREPTOCOCCUS SPECIES     Note: Gram Stain Report Called to,Read Back By and Verified With: TIVET WOODS 06/16/12 @ 9:20PM BY RUSCA.   Report Status PENDING   Incomplete   MRSA PCR SCREENING     Status: Normal   Collection Time   06/16/12  8:32 PM      Component Value Range Status Comment   MRSA by PCR NEGATIVE  NEGATIVE Final      Studies: Dg Chest 2 View  06/15/2012  *RADIOLOGY REPORT*  Clinical Data: Shortness of breath.  Weakness.  Urinary tract infection.  CHEST - 2 VIEW  Comparison: 10/15/2011  Findings: Low lung volumes seen however both lungs are clear. Heart size is stable within normal limits.  No evidence of pleural effusion.  IMPRESSION: Low lung volumes.  No acute findings.   Original Report Authenticated By: Myles Rosenthal, M.D.    Ct Chest W Contrast  06/16/2012  *RADIOLOGY REPORT*  Clinical Data: Cough and crackles.  Leukocytosis.  CT CHEST WITH CONTRAST  Technique:  Multidetector CT imaging of the chest was performed following the standard protocol during bolus administration of intravenous contrast.  Contrast: OMNIPAQUE IOHEXOL 300 MG/ML  SOLN  Comparison: Chest CT  01/14/2005.  Findings:  Mediastinum: Heart size is mildly enlarged. There is no significant pericardial fluid, thickening or pericardial calcification. There is atherosclerosis of the thoracic aorta, the great vessels of the mediastinum and the coronary arteries, including calcified atherosclerotic plaque in the left main, left anterior descending and right coronary arteries. There are numerous borderline enlarged and mildly enlarged mediastinal  lymph nodes, largest of which measures up to 1.2 cm in short axis in the right paratracheal station.  Esophagus is unremarkable in appearance.  Lungs/Pleura: Extensive air space consolidation is noted in the apex of the left upper lobe and the posterior aspect of the right middle lobe, concerning for multilobar pneumonia.  Mild diffuse bronchial wall thickening is noted.  Trace bilateral pleural effusions and some dependent atelectasis in the lower lobes of the lungs bilaterally.  Mild bilateral lower lobe cylindrical bronchiectasis. No definite suspicious appearing pulmonary nodules.  Upper Abdomen: Linear areas of ill-defined low attenuation in the liver adjacent to the fissure for the ligamentum venosum is likely to represent focal fatty infiltration.  Nonspecific stranding in the left upper quadrant adjacent to the upper pole of the left kidney.  Musculoskeletal: There are no aggressive appearing lytic or blastic lesions noted in the visualized portions of the skeleton.  IMPRESSION: 1.  Multilobar pneumonia in the apex of the left upper lobe and posterior aspect of the right middle lobe (lateral segment). 2.  Trace bilateral pleural effusions. 3.  Mild cylindrical bronchiectasis in the lower lobes of the lungs bilaterally. 4.  Nonspecific stranding in the left upper quadrant of the abdomen.  This could be related to baseline renal insufficiency, however, clinical correlation for signs and symptoms of pancreatitis may be warranted.  If there is any elevation of lipase  levels, further evaluation with dedicated abdominal and pelvic imaging may be warranted.  5.  Atherosclerosis, including left main and two-vessel coronary artery disease.   Original Report Authenticated By: Trudie Reed, M.D.    Portable Chest 1 View  06/16/2012  *RADIOLOGY REPORT*  Clinical Data: Cough and congestion.  PORTABLE CHEST - 1 VIEW  Comparison: 06/15/2012  Findings: Semi upright portable view of the chest was obtained. The patient continues to have slightly low lung volumes.  There are asymmetric densities in the left upper lung and apex region.  There is concern for airspace disease and cannot exclude a lesion in this area.  There are streaky densities at the left lung base and right mid lung.  Heart and mediastinum are within normal limits.  The thoracic aorta is heavily calcified.  IMPRESSION: There is concern for patchy parenchymal disease that could represent airspace disease and infection.  In particular, there is concern for opacities in the left apical region. This area could be further evaluated with a chest CT to exclude an underlying lesion.  These results will be called to the ordering clinician or representative by the Radiologist Assistant, and communication documented in the PACS Dashboard.   Original Report Authenticated By: Richarda Overlie, M.D.     Scheduled Meds:   . acetaminophen  500 mg Oral BID  . furosemide  20 mg Intravenous BID  . [COMPLETED] furosemide  40 mg Intravenous Once  . insulin aspart  0-20 Units Subcutaneous TID WC  . insulin aspart  0-5 Units Subcutaneous QHS  . insulin aspart  6 Units Subcutaneous TID WC  . insulin detemir  30 Units Subcutaneous Q breakfast  . ipratropium  0.5 mg Nebulization Q6H  . levalbuterol  0.63 mg Nebulization Q6H  . levothyroxine  150 mcg Oral Daily  . [COMPLETED] methylPREDNISolone (SOLU-MEDROL) injection  60 mg Intravenous Once  . metoprolol  5 mg Intravenous Once  . metoprolol  25 mg Oral BID  . piperacillin-tazobactam  (ZOSYN)  IV  3.375 g Intravenous Q8H  . simvastatin  40 mg Oral QHS  . sodium chloride  3  mL Intravenous Q12H  . vancomycin  1,500 mg Intravenous Q24H  . vancomycin  2,000 mg Intravenous Once  . [COMPLETED] warfarin  5 mg Oral ONCE-1800  . warfarin  7.5 mg Oral ONCE-1800  . Warfarin - Pharmacist Dosing Inpatient   Does not apply q1800  . [DISCONTINUED] albuterol  2.5 mg Nebulization Q4H  . [DISCONTINUED] azithromycin  500 mg Intravenous Q24H  . [DISCONTINUED] cefTRIAXone (ROCEPHIN) IVPB 1 gram/50 mL D5W  1 g Intravenous q1800  . [DISCONTINUED] ipratropium  0.5 mg Nebulization Q4H   Continuous Infusions:   . sodium chloride 10 mL/hr at 06/16/12 1800    Principal Problem:  *Sepsis(995.91) Active Problems:  HYPERTENSION  UTI (lower urinary tract infection)  Metabolic encephalopathy  Atrial fibrillation with RVR  Hyponatremia        Elai Vanwyk  [facility] Pager [phone]. If 8PM-8AM, please contact night-coverage at [email or web address], password [facility] 06/17/2012, 10:48 AM  LOS: 2 days

## 2012-06-17 NOTE — Progress Notes (Signed)
ANTIBIOTIC CONSULT NOTE - INITIAL  Pharmacy Consult for Vancomycin/Zosyn Indication: ?PNA/bacteremia/UTI  Allergies  Allergen Reactions  . Insulin Glargine     Itching   . Latex     Rash   . Metformin And Related     Severe diarrhea   . Metoclopramide Hcl     REACTION: unspecified  . Nsaids     REACTION: unspecified  . Promethazine Hcl     REACTION: unspecified    Patient Measurements: Height: 5\' 7"  (170.2 cm) Weight: 245 lb 13 oz (111.5 kg) IBW/kg (Calculated) : 61.6  Adjusted Body Weight:   Vital Signs: Temp: 97.3 F (36.3 C) (11/17 0800) Temp src: Axillary (11/17 0800) BP: 127/69 mmHg (11/17 0900) Pulse Rate: 104  (11/17 0900) Intake/Output from previous day: 11/16 0701 - 11/17 0700 In: 760 [P.O.:240; I.V.:220; IV Piggyback:300] Out: 3550 [Urine:3550] Intake/Output from this shift: Total I/O In: 30 [I.V.:30] Out: -   Labs:  Basename 06/16/12 0930 06/16/12 0635 06/15/12 1629 06/15/12 1105  WBC -- 24.5* -- 22.7*  HGB -- 11.4* -- 12.3  PLT -- 245 -- 309  LABCREA -- -- 133.8 --  CREATININE 0.95 -- -- 0.99   Estimated Creatinine Clearance: 53.7 ml/min (by C-G formula based on Cr of 0.95). No results found for this basename: VANCOTROUGH:2,VANCOPEAK:2,VANCORANDOM:2,GENTTROUGH:2,GENTPEAK:2,GENTRANDOM:2,TOBRATROUGH:2,TOBRAPEAK:2,TOBRARND:2,AMIKACINPEAK:2,AMIKACINTROU:2,AMIKACIN:2, in the last 72 hours   Microbiology: Recent Results (from the past 720 hour(s))  URINE CULTURE     Status: Normal   Collection Time   06/15/12 11:04 AM      Component Value Range Status Comment   Specimen Description URINE, CATHETERIZED   Final    Special Requests NONE   Final    Culture  Setup Time 06/15/2012 21:56   Final    Colony Count >=100,000 COLONIES/ML   Final    Culture     Final    Value: Multiple bacterial morphotypes present, none predominant. Suggest appropriate recollection if clinically indicated.   Report Status 06/16/2012 FINAL   Final   CULTURE, BLOOD  (ROUTINE X 2)     Status: Normal (Preliminary result)   Collection Time   06/15/12  4:30 PM      Component Value Range Status Comment   Specimen Description BLOOD LEFT HAND   Final    Special Requests BOTTLES DRAWN AEROBIC ONLY 10CC   Final    Culture  Setup Time 06/16/2012 01:07   Final    Culture     Final    Value: GRAM POSITIVE COCCI IN CHAINS     Note: Gram Stain Report Called to,Read Back By and Verified With: DAWN LARSON 06/16/12 @ 2:40PM BY RUSCA.   Report Status PENDING   Incomplete   CULTURE, BLOOD (ROUTINE X 2)     Status: Normal (Preliminary result)   Collection Time   06/15/12  4:50 PM      Component Value Range Status Comment   Specimen Description BLOOD RIGHT ARM   Final    Special Requests BOTTLES DRAWN AEROBIC AND ANAEROBIC 10CC   Final    Culture  Setup Time 06/16/2012 01:07   Final    Culture     Final    Value: GRAM POSITIVE COCCI IN PAIRS     Note: Gram Stain Report Called to,Read Back By and Verified With: TIVET WOODS 06/16/12 @ 9:20PM BY RUSCA.   Report Status PENDING   Incomplete   MRSA PCR SCREENING     Status: Normal   Collection Time   06/16/12  8:32 PM  Component Value Range Status Comment   MRSA by PCR NEGATIVE  NEGATIVE Final     Medical History: Past Medical History  Diagnosis Date  . Atrial fibrillation   . Diabetes mellitus     type II  . Anemia     Fe deficiency  . GERD (gastroesophageal reflux disease)   . Hyperlipidemia   . Hypertension   . Hypothyroid   . Obesity   . Vaginal dysplasia 1980  . Rosacea   . Chest pain 11/2002    cardiac cath neg   . Back pain   . IBS (irritable bowel syndrome)     with diarrhea  . Asthma     as a child  . Adenomatous colon polyp 12/2007  . Hemorrhoids   . Vertigo   . Hiatal hernia 12/2007    EGD    Medications:  11/16>>Azithromycin>>11/16 11/15>>Ceftriaxone>>11/16  Assessment: 25 yoF admitted 11/15 with sepsis, suspected UTI/PNA, already known to pharmacy for CTX dosing.  Blood  cultures found to be growing GPC in pairs/chains and urine cx >100K bacteria therefore therapy changed to vancomycin/zosyn.    Tm24h: 99.4  WBC 24.5  SCr 0.95, CrCl 54 ml/min  Weight 111.5kg  11/15 blood cultures (2/2): GPC in pairs/chains, urine cultures: >100K bacteria. Awaiting speciation.11/16 blood cultures pending  Goal of Therapy:  Vancomycin trough level 15-20 mcg/ml  Plan:  1.  Zosyn 3.375g IV q 8 hours (4 hour infusion) 2.  Vancomcyin loading dose 2 grams IV x 1, maintenance dose 1500mg  IV q 24 hours 3.  Follow up renal fxn, cultures, T, WBC, clinical course, vancomycin trough at steady state   Vontrell Pullman E 06/17/2012,9:45 AM

## 2012-06-17 NOTE — Consult Note (Signed)
INFECTIOUS DISEASE CONSULT NOTE  Date of Admission:  06/15/2012  Date of Consult:  06/17/2012  Reason for Consult: Bacteremia Referring Physician: Blake Divine  Impression/Recommendation Bacteremia UTI (?Urosepsis) Pneumonia Diabetic Foot  Would-  Continue vanco zosyn for her pneumonia. Await ID of her "strep species" from her blood Wound care consult Check ABI Recheck BCx for clearance  Comment- her L foot looks troubled, some evidence of ischemia, wounds between toes. Her HgBA1C suggests her FSG at home are not "good" . This will have to be improved for her foot to be salvaged.    Thank you so much for this interesting consult,   [name] [phone]  [name] is an 78 y.o. female.  HPI: [name] with hx of DM who comes to hospital at urging of her son due to increasing fatigue over 1 week. She states she is incontenent of urine and her son believed that she had a repeat UTI. She had become more confused at home. She does not believe that she had fevers at home. In ED she was found to be afebrile, WBC 22.7 and she was felt to be dehydrated. She was started on azithro/ceftriaxone. In hospital she has been found to have multilobar pneumonia on her CXR (after hydration) and her BCx have shown GPC in chains. By 11-16 she had worsening respiratory status and was transferred to step down. Her anbx were changed to vanco and zosyn today.   Past Medical History  Diagnosis Date  . Atrial fibrillation   . Diabetes mellitus     type II  . Anemia     Fe deficiency  . GERD (gastroesophageal reflux disease)   . Hyperlipidemia   . Hypertension   . Hypothyroid   . Obesity   . Vaginal dysplasia 1980  . Rosacea   . Chest pain 11/2002    cardiac cath neg   . Back pain   . IBS (irritable bowel syndrome)     with diarrhea  . Asthma     as a child  . Adenomatous colon polyp 12/2007  . Hemorrhoids   . Vertigo   . Hiatal hernia 12/2007    EGD    Past Surgical History    Procedure Date  . Abdominal hysterectomy 1963  . Cholecystectomy 2006  . Knee surgery     Rt TKR 1993 & Lt TKR 2001  . Cervical cone biopsy 1963    D&C  . Appendectomy 1941  . Cystocele repair 1978    /rectocele  . Orif ankle fracture 01/2010    fall/followed by rehab stay  . Cataract extraction 2006    bilateral     Allergies  Allergen Reactions  . Insulin Glargine     Itching   . Latex     Rash   . Metformin And Related     Severe diarrhea   . Metoclopramide Hcl     REACTION: unspecified  . Nsaids     REACTION: unspecified  . Promethazine Hcl     REACTION: unspecified    Medications:  Scheduled:   . acetaminophen  500 mg Oral BID  . furosemide  20 mg Intravenous BID  . [COMPLETED] furosemide  40 mg Intravenous Once  . insulin aspart  0-20 Units Subcutaneous TID WC  . insulin aspart  0-5 Units Subcutaneous QHS  . insulin aspart  6 Units Subcutaneous TID WC  . insulin detemir  30 Units Subcutaneous Q breakfast  . ipratropium  0.5 mg Nebulization Q6H  .  levalbuterol  0.63 mg Nebulization Q6H  . levothyroxine  150 mcg Oral Daily  . [COMPLETED] methylPREDNISolone (SOLU-MEDROL) injection  60 mg Intravenous Once  . methylPREDNISolone (SOLU-MEDROL) injection  60 mg Intravenous Q12H  . metoprolol  5 mg Intravenous Once  . metoprolol  25 mg Oral BID  . piperacillin-tazobactam (ZOSYN)  IV  3.375 g Intravenous Q8H  . potassium chloride  10 mEq Intravenous Q1 Hr x 3  . potassium chloride  40 mEq Oral Daily  . simvastatin  40 mg Oral QHS  . sodium chloride  3 mL Intravenous Q12H  . vancomycin  1,500 mg Intravenous Q24H  . vancomycin  2,000 mg Intravenous Once  . [COMPLETED] warfarin  5 mg Oral ONCE-1800  . warfarin  7.5 mg Oral ONCE-1800  . Warfarin - Pharmacist Dosing Inpatient   Does not apply q1800  . [DISCONTINUED] albuterol  2.5 mg Nebulization Q4H  . [DISCONTINUED] azithromycin  500 mg Intravenous Q24H  . [DISCONTINUED] cefTRIAXone (ROCEPHIN) IVPB 1 gram/50  mL D5W  1 g Intravenous q1800  . [DISCONTINUED] ipratropium  0.5 mg Nebulization Q4H    Total days of antibiotics 3    Day 1 vanco/zosyn      Social History:  reports that she has quit smoking. She has never used smokeless tobacco. She reports that she does not drink alcohol or use illicit drugs. She lives with her husband, son is close by.  Family History  Problem Relation Age of Onset  . Cancer Mother     leukemia  . Coronary artery disease Mother   . Dementia Mother   . Heart disease Father     CAD  . COPD Sister   . Heart disease Brother   . Diabetes Brother   . Kidney disease Son   . Colon cancer Brother     General ROS: occas cough, + LE edema, denies fever or chills, does have incontience, denies mental status change, has had significant fatigue. see HPI.  states her FSG at home are "good". Denies paresthesias.   Blood pressure 140/117, pulse 93, temperature 97.9 F (36.6 C), temperature source Oral, resp. rate 16, height 5\' 7"  (1.702 m), weight 111.5 kg (245 lb 13 oz), last menstrual period 09/01/1961, SpO2 98.00%. General appearance: alert, cooperative, no distress and hard of hearing! Eyes: negative findings: pupils equal, round, reactive to light and accomodation Throat: normal findings: oropharynx pink & moist without lesions or evidence of thrush Neck: no adenopathy and supple, symmetrical, trachea midline Lungs: rales bilaterally and rhonchi bilaterally Heart: tachycardic Abdomen: normal findings: bowel sounds normal and soft, non-tender Extremities: edema nonpitting and superficail wound between L toes 3-4 and 4-5. these toes appear cyanotic Light touch grossly normal   Results for orders placed during the hospital encounter of 06/15/12 (from the past 48 hour(s))  CREATININE, URINE, RANDOM     Status: Normal   Collection Time   06/15/12  4:29 PM      Component Value Range Comment   Creatinine, Urine 133.8     SODIUM, URINE, RANDOM     Status: Normal    Collection Time   06/15/12  4:29 PM      Component Value Range Comment   Sodium, Ur 20     CULTURE, BLOOD (ROUTINE X 2)     Status: Normal (Preliminary result)   Collection Time   06/15/12  4:30 PM      Component Value Range Comment   Specimen Description BLOOD LEFT HAND  Special Requests BOTTLES DRAWN AEROBIC ONLY 10CC      Culture  Setup Time 06/16/2012 01:07      Culture        Value: STREPTOCOCCUS SPECIES     Note: Gram Stain Report Called to,Read Back By and Verified With: DAWN LARSON 06/16/12 @ 2:40PM BY RUSCA.   Report Status PENDING     OSMOLALITY, URINE     Status: Normal   Collection Time   06/15/12  4:33 PM      Component Value Range Comment   Osmolality, Ur 406  390 - 1090 mOsm/kg   TSH     Status: Normal   Collection Time   06/15/12  4:50 PM      Component Value Range Comment   TSH 1.615  0.350 - 4.500 uIU/mL   HEMOGLOBIN A1C     Status: Abnormal   Collection Time   06/15/12  4:50 PM      Component Value Range Comment   Hemoglobin A1C 7.9 (*) <5.7 %    Mean Plasma Glucose 180 (*) <117 mg/dL   CULTURE, BLOOD (ROUTINE X 2)     Status: Normal (Preliminary result)   Collection Time   06/15/12  4:50 PM      Component Value Range Comment   Specimen Description BLOOD RIGHT ARM      Special Requests BOTTLES DRAWN AEROBIC AND ANAEROBIC 10CC      Culture  Setup Time 06/16/2012 01:07      Culture        Value: STREPTOCOCCUS SPECIES     Note: Gram Stain Report Called to,Read Back By and Verified With: TIVET WOODS 06/16/12 @ 9:20PM BY RUSCA.   Report Status PENDING     GLUCOSE, CAPILLARY     Status: Abnormal   Collection Time   06/15/12  4:56 PM      Component Value Range Comment   Glucose-Capillary 122 (*) 70 - 99 mg/dL   GLUCOSE, CAPILLARY     Status: Abnormal   Collection Time   06/15/12  9:42 PM      Component Value Range Comment   Glucose-Capillary 211 (*) 70 - 99 mg/dL    Comment 1 Documented in Chart      Comment 2 Notify RN     CBC     Status:  Abnormal   Collection Time   06/16/12  6:35 AM      Component Value Range Comment   WBC 24.5 (*) 4.0 - 10.5 K/uL WHITE COUNT CONFIRMED ON SMEAR   RBC 3.71 (*) 3.87 - 5.11 MIL/uL    Hemoglobin 11.4 (*) 12.0 - 15.0 g/dL    HCT 29.5 (*) 62.1 - 46.0 %    MCV 92.2  78.0 - 100.0 fL    MCH 30.7  26.0 - 34.0 pg    MCHC 33.3  30.0 - 36.0 g/dL    RDW 30.8  65.7 - 84.6 %    Platelets 245  150 - 400 K/uL PLATELET COUNT CONFIRMED BY SMEAR  GLUCOSE, CAPILLARY     Status: Abnormal   Collection Time   06/16/12  7:44 AM      Component Value Range Comment   Glucose-Capillary 147 (*) 70 - 99 mg/dL   PROTIME-INR     Status: Abnormal   Collection Time   06/16/12  9:29 AM      Component Value Range Comment   Prothrombin Time 19.8 (*) 11.6 - 15.2 seconds    INR 1.75 (*) 0.00 -  1.49   COMPREHENSIVE METABOLIC PANEL     Status: Abnormal   Collection Time   06/16/12  9:30 AM      Component Value Range Comment   Sodium 133 (*) 135 - 145 mEq/L    Potassium 3.6  3.5 - 5.1 mEq/L    Chloride 97  96 - 112 mEq/L    CO2 21  19 - 32 mEq/L    Glucose, Bld 243 (*) 70 - 99 mg/dL    BUN 20  6 - 23 mg/dL    Creatinine, Ser 4.78  0.50 - 1.10 mg/dL    Calcium 8.3 (*) 8.4 - 10.5 mg/dL    Total Protein 6.9  6.0 - 8.3 g/dL    Albumin 2.3 (*) 3.5 - 5.2 g/dL    AST 19  0 - 37 U/L    ALT 11  0 - 35 U/L    Alkaline Phosphatase 60  39 - 117 U/L    Total Bilirubin 0.7  0.3 - 1.2 mg/dL    GFR calc non Af Amer 52 (*) >90 mL/min    GFR calc Af Amer 61 (*) >90 mL/min   GLUCOSE, CAPILLARY     Status: Abnormal   Collection Time   06/16/12 11:59 AM      Component Value Range Comment   Glucose-Capillary 199 (*) 70 - 99 mg/dL   GLUCOSE, CAPILLARY     Status: Abnormal   Collection Time   06/16/12  5:08 PM      Component Value Range Comment   Glucose-Capillary 156 (*) 70 - 99 mg/dL   HIV ANTIBODY (ROUTINE TESTING)     Status: Normal   Collection Time   06/16/12  7:48 PM      Component Value Range Comment   HIV NON  REACTIVE  NON REACTIVE   PRO B NATRIURETIC PEPTIDE     Status: Abnormal   Collection Time   06/16/12  7:48 PM      Component Value Range Comment   Pro B Natriuretic peptide (BNP) 3124.0 (*) 0 - 450 pg/mL   MRSA PCR SCREENING     Status: Normal   Collection Time   06/16/12  8:32 PM      Component Value Range Comment   MRSA by PCR NEGATIVE  NEGATIVE   STREP PNEUMONIAE URINARY ANTIGEN     Status: Normal   Collection Time   06/16/12  8:46 PM      Component Value Range Comment   Strep Pneumo Urinary Antigen NEGATIVE  NEGATIVE   GLUCOSE, CAPILLARY     Status: Abnormal   Collection Time   06/16/12  9:44 PM      Component Value Range Comment   Glucose-Capillary 177 (*) 70 - 99 mg/dL    Comment 1 Documented in Chart      Comment 2 Notify RN     PROTIME-INR     Status: Abnormal   Collection Time   06/17/12  4:00 AM      Component Value Range Comment   Prothrombin Time 16.4 (*) 11.6 - 15.2 seconds    INR 1.35  0.00 - 1.49   GLUCOSE, CAPILLARY     Status: Abnormal   Collection Time   06/17/12  7:55 AM      Component Value Range Comment   Glucose-Capillary 136 (*) 70 - 99 mg/dL   CBC     Status: Abnormal   Collection Time   06/17/12 10:25 AM  Component Value Range Comment   WBC 16.3 (*) 4.0 - 10.5 K/uL    RBC 3.60 (*) 3.87 - 5.11 MIL/uL    Hemoglobin 10.9 (*) 12.0 - 15.0 g/dL    HCT 04.5 (*) 40.9 - 46.0 %    MCV 91.1  78.0 - 100.0 fL    MCH 30.3  26.0 - 34.0 pg    MCHC 33.2  30.0 - 36.0 g/dL    RDW 81.1  91.4 - 78.2 %    Platelets 314  150 - 400 K/uL   BASIC METABOLIC PANEL     Status: Abnormal   Collection Time   06/17/12 10:25 AM      Component Value Range Comment   Sodium 134 (*) 135 - 145 mEq/L    Potassium 3.0 (*) 3.5 - 5.1 mEq/L    Chloride 96  96 - 112 mEq/L    CO2 26  19 - 32 mEq/L    Glucose, Bld 197 (*) 70 - 99 mg/dL    BUN 21  6 - 23 mg/dL    Creatinine, Ser 9.56  0.50 - 1.10 mg/dL    Calcium 8.5  8.4 - 21.3 mg/dL    GFR calc non Af Amer 47 (*) >90 mL/min     GFR calc Af Amer 54 (*) >90 mL/min   GLUCOSE, CAPILLARY     Status: Abnormal   Collection Time   06/17/12 12:24 PM      Component Value Range Comment   Glucose-Capillary 176 (*) 70 - 99 mg/dL    Comment 1 Notify RN         Component Value Date/Time   SDES BLOOD RIGHT ARM 06/15/2012 1650   SPECREQUEST BOTTLES DRAWN AEROBIC AND ANAEROBIC 10CC 06/15/2012 1650   CULT  Value: STREPTOCOCCUS SPECIES Note: Gram Stain Report Called to,Read Back By and Verified With: TIVET WOODS 06/16/12 @ 9:20PM BY RUSCA. 06/15/2012 1650   REPTSTATUS PENDING 06/15/2012 1650   Ct Chest W Contrast  06/16/2012  *RADIOLOGY REPORT*  Clinical Data: Cough and crackles.  Leukocytosis.  CT CHEST WITH CONTRAST  Technique:  Multidetector CT imaging of the chest was performed following the standard protocol during bolus administration of intravenous contrast.  Contrast: OMNIPAQUE IOHEXOL 300 MG/ML  SOLN  Comparison: Chest CT 01/14/2005.  Findings:  Mediastinum: Heart size is mildly enlarged. There is no significant pericardial fluid, thickening or pericardial calcification. There is atherosclerosis of the thoracic aorta, the great vessels of the mediastinum and the coronary arteries, including calcified atherosclerotic plaque in the left main, left anterior descending and right coronary arteries. There are numerous borderline enlarged and mildly enlarged mediastinal lymph nodes, largest of which measures up to 1.2 cm in short axis in the right paratracheal station.  Esophagus is unremarkable in appearance.  Lungs/Pleura: Extensive air space consolidation is noted in the apex of the left upper lobe and the posterior aspect of the right middle lobe, concerning for multilobar pneumonia.  Mild diffuse bronchial wall thickening is noted.  Trace bilateral pleural effusions and some dependent atelectasis in the lower lobes of the lungs bilaterally.  Mild bilateral lower lobe cylindrical bronchiectasis. No definite suspicious appearing  pulmonary nodules.  Upper Abdomen: Linear areas of ill-defined low attenuation in the liver adjacent to the fissure for the ligamentum venosum is likely to represent focal fatty infiltration.  Nonspecific stranding in the left upper quadrant adjacent to the upper pole of the left kidney.  Musculoskeletal: There are no aggressive appearing lytic or blastic lesions  noted in the visualized portions of the skeleton.  IMPRESSION: 1.  Multilobar pneumonia in the apex of the left upper lobe and posterior aspect of the right middle lobe (lateral segment). 2.  Trace bilateral pleural effusions. 3.  Mild cylindrical bronchiectasis in the lower lobes of the lungs bilaterally. 4.  Nonspecific stranding in the left upper quadrant of the abdomen.  This could be related to baseline renal insufficiency, however, clinical correlation for signs and symptoms of pancreatitis may be warranted.  If there is any elevation of lipase levels, further evaluation with dedicated abdominal and pelvic imaging may be warranted.  5.  Atherosclerosis, including left main and two-vessel coronary artery disease.   Original Report Authenticated By: Trudie Reed, M.D.    Portable Chest 1 View  06/16/2012  *RADIOLOGY REPORT*  Clinical Data: Cough and congestion.  PORTABLE CHEST - 1 VIEW  Comparison: 06/15/2012  Findings: Semi upright portable view of the chest was obtained. The patient continues to have slightly low lung volumes.  There are asymmetric densities in the left upper lung and apex region.  There is concern for airspace disease and cannot exclude a lesion in this area.  There are streaky densities at the left lung base and right mid lung.  Heart and mediastinum are within normal limits.  The thoracic aorta is heavily calcified.  IMPRESSION: There is concern for patchy parenchymal disease that could represent airspace disease and infection.  In particular, there is concern for opacities in the left apical region. This area could be  further evaluated with a chest CT to exclude an underlying lesion.  These results will be called to the ordering clinician or representative by the Radiologist Assistant, and communication documented in the PACS Dashboard.   Original Report Authenticated By: Richarda Overlie, M.D.    Recent Results (from the past 240 hour(s))  URINE CULTURE     Status: Normal   Collection Time   06/15/12 11:04 AM      Component Value Range Status Comment   Specimen Description URINE, CATHETERIZED   Final    Special Requests NONE   Final    Culture  Setup Time 06/15/2012 21:56   Final    Colony Count >=100,000 COLONIES/ML   Final    Culture     Final    Value: Multiple bacterial morphotypes present, none predominant. Suggest appropriate recollection if clinically indicated.   Report Status 06/16/2012 FINAL   Final   CULTURE, BLOOD (ROUTINE X 2)     Status: Normal (Preliminary result)   Collection Time   06/15/12  4:30 PM      Component Value Range Status Comment   Specimen Description BLOOD LEFT HAND   Final    Special Requests BOTTLES DRAWN AEROBIC ONLY 10CC   Final    Culture  Setup Time 06/16/2012 01:07   Final    Culture     Final    Value: STREPTOCOCCUS SPECIES     Note: Gram Stain Report Called to,Read Back By and Verified With: DAWN LARSON 06/16/12 @ 2:40PM BY RUSCA.   Report Status PENDING   Incomplete   CULTURE, BLOOD (ROUTINE X 2)     Status: Normal (Preliminary result)   Collection Time   06/15/12  4:50 PM      Component Value Range Status Comment   Specimen Description BLOOD RIGHT ARM   Final    Special Requests BOTTLES DRAWN AEROBIC AND ANAEROBIC 10CC   Final    Culture  Setup Time 06/16/2012  01:07   Final    Culture     Final    Value: STREPTOCOCCUS SPECIES     Note: Gram Stain Report Called to,Read Back By and Verified With: TIVET WOODS 06/16/12 @ 9:20PM BY RUSCA.   Report Status PENDING   Incomplete   MRSA PCR SCREENING     Status: Normal   Collection Time   06/16/12  8:32 PM       Component Value Range Status Comment   MRSA by PCR NEGATIVE  NEGATIVE Final       06/17/2012, 2:13 PM     LOS: 2 days

## 2012-06-17 NOTE — Evaluation (Signed)
Physical Therapy Evaluation Patient Details Name: [name] MRN: [id number] DOB: February 09, 1923 Today's Date: 06/17/2012 Time: 7846-9629 PT Time Calculation (min): 26 min  PT Assessment / Plan / Recommendation Clinical Impression  78 yo female admitted with AMS and UTI who is in step down unit for closer cardiopulmonary monitoring.  She needs to be fairly independent at home to live with her elderly husband and I feel she would benefit from short term SNF placement to optimize strength and function prior to d/c to home    PT Assessment  Patient needs continued PT services    Follow Up Recommendations       Does the patient have the potential to tolerate intense rehabilitation      Barriers to Discharge Decreased caregiver support      Equipment Recommendations  None recommended by PT    Recommendations for Other Services OT consult   Frequency Min 3X/week    Precautions / Restrictions Precautions Precautions: Fall Restrictions Weight Bearing Restrictions: No   Pertinent Vitals/Pain Pt c/o pain "all over"      Mobility  Bed Mobility Bed Mobility: Rolling Right;Rolling Left;Supine to Sit Rolling Right: 3: Mod assist Rolling Left: 3: Mod assist Supine to Sit: 3: Mod assist Details for Bed Mobility Assistance: pt with difficulty due to obesity and ICU mattress.  Pt did not want to be touched or assisted due to c/o pain all over  Pt requires extra time to allow her to do the activity Transfers Transfers: Sit to Stand;Stand to Sit Sit to Stand: 3: Mod assist Stand to Sit: 3: Mod assist Details for Transfer Assistance: pulled up on pad to initate lift off for sit to stand pt requires extra time Ambulation/Gait Ambulation/Gait Assistance: 3: Mod assist Ambulation Distance (Feet): 3 Feet Ambulation/Gait Assistance Details: pt distracted by lines and tubes, but was able to manage herself to get to chair Gait Pattern: Step-to pattern Gait velocity: decreased General  Gait Details: leans over walker.  Self directs activity Stairs: No Wheelchair Mobility Wheelchair Mobility: No    Shoulder Instructions     Exercises     PT Diagnosis: Difficulty walking;Generalized weakness;Altered mental status  PT Problem List: Decreased strength;Decreased activity tolerance;Obesity;Decreased mobility;Cardiopulmonary status limiting activity PT Treatment Interventions: DME instruction;Gait training;Functional mobility training;Therapeutic activities;Therapeutic exercise;Balance training;Patient/family education   PT Goals Acute Rehab PT Goals PT Goal Formulation: With patient Time For Goal Achievement: 07/01/12 Potential to Achieve Goals: Good Pt will go Supine/Side to Sit: with min assist PT Goal: Supine/Side to Sit - Progress: Goal set today Pt will go Sit to Supine/Side: with min assist PT Goal: Sit to Supine/Side - Progress: Goal set today Pt will go Sit to Stand: with modified independence PT Goal: Sit to Stand - Progress: Goal set today Pt will go Stand to Sit: with modified independence PT Goal: Stand to Sit - Progress: Goal set today Pt will Stand: 3 - 5 min;with modified independence PT Goal: Stand - Progress: Goal set today Pt will Ambulate: 51 - 150 feet;with modified independence PT Goal: Ambulate - Progress: Goal set today Pt will Perform Home Exercise Program: with supervision, verbal cues required/provided PT Goal: Perform Home Exercise Program - Progress: Goal set today  Visit Information  Last PT Received On: 06/17/12 Assistance Needed: +2    Subjective Data  Subjective: I don't hear, honey Patient Stated Goal: to go home with husband   Prior Functioning  Home Living Lives With: Spouse Available Help at Discharge: Family Type of Home: House Home  Access: Ramped entrance Home Layout: One level Home Adaptive Equipment: Walker - rolling Prior Function Level of Independence: Independent with assistive device(s) Comments: Pt reports  her husband has been sick also and so he can't help take care of her Communication Communication: HOH    Cognition  Overall Cognitive Status: Appears within functional limits for tasks assessed/performed Arousal/Alertness: Awake/alert Orientation Level: Appears intact for tasks assessed Behavior During Session: [facility] for tasks performed Cognition - Other Comments: needs frequent redirection to task.  Pt wants to do things "her way"    Extremity/Trunk Assessment Right Lower Extremity Assessment RLE ROM/Strength/Tone: [facility] for tasks assessed RLE Sensation: WFL - Light Touch;WFL - Proprioception Left Lower Extremity Assessment LLE ROM/Strength/Tone: WFL for tasks assessed LLE Sensation: WFL - Light Touch;WFL - Proprioception Trunk Assessment Trunk Assessment: Other exceptions Trunk Exceptions: obese   Balance Balance Balance Assessed: Yes Static Sitting Balance Static Sitting - Balance Support: No upper extremity supported Static Sitting - Level of Assistance: 4: Min assist Static Sitting - Comment/# of Minutes: ~5  End of Session PT - End of Session Equipment Utilized During Treatment: Oxygen Activity Tolerance: Patient tolerated treatment well Patient left: in chair;with nursing in room Nurse Communication: Mobility status  GP     Bayard Hugger. [place], [place] [phone] 06/17/2012, 3:48 PM

## 2012-06-18 ENCOUNTER — Ambulatory Visit: Payer: Medicare Other

## 2012-06-18 LAB — CULTURE, BLOOD (ROUTINE X 2)

## 2012-06-18 LAB — BASIC METABOLIC PANEL
BUN: 27 mg/dL — ABNORMAL HIGH (ref 6–23)
CO2: 27 mEq/L (ref 19–32)
Chloride: 95 mEq/L — ABNORMAL LOW (ref 96–112)
GFR calc Af Amer: 49 mL/min — ABNORMAL LOW (ref >90)
Glucose, Bld: 255 mg/dL — ABNORMAL HIGH (ref 70–99)
Potassium: 3.9 mEq/L (ref 3.5–5.1)

## 2012-06-18 LAB — GLUCOSE, CAPILLARY
Glucose-Capillary: 278 mg/dL — ABNORMAL HIGH (ref 70–99)
Glucose-Capillary: 363 mg/dL — ABNORMAL HIGH (ref 70–99)
Glucose-Capillary: 308 mg/dL — ABNORMAL HIGH (ref 70–99)
Glucose-Capillary: 328 mg/dL — ABNORMAL HIGH (ref 70–99)

## 2012-06-18 LAB — CBC
HCT: 33.1 % — ABNORMAL LOW (ref 36.0–46.0)
Hemoglobin: 11.1 g/dL — ABNORMAL LOW (ref 12.0–15.0)
RBC: 3.61 MIL/uL — ABNORMAL LOW (ref 3.87–5.11)
WBC: 14.3 10*3/uL — ABNORMAL HIGH (ref 4.0–10.5)

## 2012-06-18 LAB — PROTIME-INR: INR: 1.68 — ABNORMAL HIGH (ref 0.00–1.49)

## 2012-06-18 MED ORDER — WARFARIN SODIUM 5 MG PO TABS
5.0000 mg | ORAL_TABLET | Freq: Once | ORAL | Status: AC
  Administered 2012-06-18: 5 mg via ORAL
  Filled 2012-06-18 (×2): qty 1

## 2012-06-18 MED ORDER — GUAIFENESIN 100 MG/5ML PO SOLN
200.0000 mg | ORAL | Status: DC | PRN
  Administered 2012-06-18: 200 mg via ORAL
  Filled 2012-06-18: qty 10

## 2012-06-18 MED ORDER — INSULIN DETEMIR 100 UNIT/ML ~~LOC~~ SOLN
60.0000 [IU] | Freq: Every day | SUBCUTANEOUS | Status: DC
  Administered 2012-06-19 – 2012-06-20 (×2): 60 [IU] via SUBCUTANEOUS
  Filled 2012-06-18: qty 10

## 2012-06-18 NOTE — Progress Notes (Signed)
[id number] Cheryl Plater, RN, BSN, CCM: CHART REVIEWED AND UPDATED.  Next Review due on [id number]. NO DISCHARGE NEEDS PRESENT AT THIS TIME. CASE MANAGEMENT [phone]

## 2012-06-18 NOTE — Progress Notes (Signed)
Inpatient Diabetes Program Recommendations  AACE/ADA: New Consensus Statement on Inpatient Glycemic Control (2013)  Target Ranges:  Prepandial:   less than 140 mg/dL      Peak postprandial:   less than 180 mg/dL (1-2 hours)      Critically ill patients:  140 - 180 mg/dL   Reason for Visit: Hyperglycemia  On Solumedrol 60 Q12 hours.  Results for DAMETRA, WHETSEL (MRN [id number]) as of 06/18/2012 10:07  Ref. Range 06/15/2012 16:50  Hemoglobin A1C Latest Range: <5.7 % 7.9 (H)  Results for RENESHA, LIZAMA (MRN [id number]) as of 06/18/2012 10:07  Ref. Range 06/17/2012 14:25 06/17/2012 19:08 06/17/2012 22:08 06/18/2012 08:28  Glucose-Capillary Latest Range: 70-99 mg/dL 782 (H) 956 (H) 213 (H) 278 (H)    Inpatient Diabetes Program Recommendations Insulin - Basal: Uptitrate Levemir - pt on 60 units QDAC at home Insulin - Meal Coverage: Increase Novolog to 8 units tidwc  Note: Will continue to follow.

## 2012-06-18 NOTE — Progress Notes (Signed)
Physical Therapy Treatment Patient Details Name: [name] MRN: [id number] DOB: 04-12-1923 Today's Date: 06/18/2012 Time: 5621-3086 PT Time Calculation (min): 33 min  PT Assessment / Plan / Recommendation Comments on Treatment Session  Pt progressing with mobility, however continues to require +2 for safety and line management.  Noted that pt became very tachycadic during session with HR rising to 168 at highest and with afib.  RN notified.      Follow Up Recommendations  SNF;Supervision/Assistance - 24 hour     Does the patient have the potential to tolerate intense rehabilitation     Barriers to Discharge        Equipment Recommendations  None recommended by PT    Recommendations for Other Services    Frequency Min 3X/week   Plan Discharge plan remains appropriate    Precautions / Restrictions Precautions Precautions: Fall Precaution Comments: Monitor HR, pt got very tachy during session Restrictions Weight Bearing Restrictions: No   Pertinent Vitals/Pain No pain    Mobility  Bed Mobility Bed Mobility: Supine to Sit;Sitting - Scoot to Edge of Bed Supine to Sit: 4: Min assist Sitting - Scoot to [place] of Bed: 4: Min assist Details for Bed Mobility Assistance: Pt requires some assist for steadying trunk and to ensure safety of LEs getting to EOB.  cues for hand placement and technique.  Noted that pts HR increased to 157 with bed mobility.   Transfers Transfers: Sit to Stand;Stand to Sit Sit to Stand: 1: +2 Total assist;With upper extremity assist;From bed Sit to Stand: Patient Percentage: 70% Stand to Sit: 1: +2 Total assist;With armrests;To chair/3-in-1 Stand to Sit: Patient Percentage: 70% Details for Transfer Assistance: Assist to rise and stabalize with cues for hand placement, safety and to ensure controlled descent.  Ambulation/Gait Ambulation/Gait Assistance: 1: +2 Total assist Ambulation/Gait: Patient Percentage: 70% Ambulation Distance (Feet): 5  Feet Assistive device: Rolling walker Ambulation/Gait Assistance Details: Assist to steady throughout with cues for sequencing/technique with RW and to keep RW with her until all the way at seating surface.  Gait Pattern: Step-to pattern Gait velocity: decreased    Exercises     PT Diagnosis:    PT Problem List:   PT Treatment Interventions:     PT Goals Acute Rehab PT Goals PT Goal Formulation: With patient Time For Goal Achievement: 07/01/12 Potential to Achieve Goals: Good Pt will go Supine/Side to Sit: with supervision PT Goal: Supine/Side to Sit - Progress: Updated due to goal met Pt will go Sit to Stand: with modified independence PT Goal: Sit to Stand - Progress: Progressing toward goal Pt will go Stand to Sit: with modified independence PT Goal: Stand to Sit - Progress: Progressing toward goal Pt will Stand: 3 - 5 min;with modified independence PT Goal: Stand - Progress: Progressing toward goal Pt will Ambulate: 51 - 150 feet;with modified independence PT Goal: Ambulate - Progress: Progressing toward goal  Visit Information  Last PT Received On: 06/18/12 Assistance Needed: +2    Subjective Data  Subjective: I've been so sick for so long, I can't remember (in response to what she was doing for herself at home) Patient Stated Goal: to go home with husband   Cognition  Overall Cognitive Status: Appears within functional limits for tasks assessed/performed Arousal/Alertness: Awake/alert Orientation Level: Appears intact for tasks assessed Behavior During Session: [facility] for tasks performed Cognition - Other Comments: needs frequent redirection to task.  Pt wants to do things "her way"    Balance  Balance Balance  Assessed: Yes Static Sitting Balance Static Sitting - Balance Support: No upper extremity supported;Feet supported Static Sitting - Level of Assistance: 5: Stand by assistance  End of Session PT - End of Session Equipment Utilized During Treatment:  Oxygen Activity Tolerance: Patient tolerated treatment well Patient left: in chair;with call bell/phone within reach Nurse Communication: Mobility status (HR during activity. )   GP     Page, Meribeth Mattes 06/18/2012, 1:22 PM

## 2012-06-18 NOTE — Clinical Social Work Psychosocial (Signed)
Clinical Social Work Department BRIEF PSYCHOSOCIAL ASSESSMENT 06/18/2012  Patient:  [name], [name]     Account Number:  [id number]     Admit date:  06/15/2012  Clinical Social Worker:  Vennie Homans, Theresia Majors  Date/Time:  06/18/2012 12:00 M  Referred by:  RN  Date Referred:  06/18/2012 Referred for  SNF Placement   Other Referral:   QUESTIONS CONCERNING HOSPICE REFERRAL IN COMMUNITY   Interview type:  Patient Other interview type:   CHART REVIEW, DISCUSSION WITH RN    PSYCHOSOCIAL DATA Living Status:  HUSBAND Admitted from facility:   Level of care:   Primary support name:  JUNIOR [place] Primary support relationship to patient:  SPOUSE Degree of support available:   GOOD FROM HUSBAND AND STEP DAUGHTER LIVES NEXT DOOR    CURRENT CONCERNS Current Concerns  Post-Acute Placement   Other Concerns:   POSSIBLE HOSPICE INVOLVEMENT PER EPIC NOTES FROM MD OFFICE LAST WEEK    SOCIAL WORK ASSESSMENT / PLAN CSW reviewed chart and met with Pt due to need for new SNF placement and possible hospice involvement. Per Pt, she has step daughters, but not a daughter. Her sons help make decisions for her. Referral to hospice was done at MD office outpt on request of "daughter". Not sure Pt is aware of this request. She denies being followed by hospice currently.  Pt is agreeable to ST SNF, but has concerns about affording copays at SNF. Pt reports to have financial means, but doesn't want to spend her money. Pt prefers [facility]. CSW to begin SNF search and explained in network vs out of network sNF.   Assessment/plan status:  Referral to [facility] Other assessment/ plan:   SNF search   Information/referral to community resources:    PATIENT'S/FAMILY'S RESPONSE TO PLAN OF CARE: Pt appreciative and agreeable to SNF search. CSW to begin SNF search and follow for placement.    Doreen Salvage, LCSW ICU/Stepdown Clinical Social Worker [facility] Cell  [phone] Hours 8am-1200pm M-F

## 2012-06-18 NOTE — Progress Notes (Signed)
  Echocardiogram 2D Echocardiogram has been performed.  [name] 06/18/2012, 2:42 PM
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
## 2012-06-18 NOTE — Progress Notes (Signed)
INFECTIOUS DISEASE PROGRESS NOTE  ID: [name] is a 78 y.o. female with   Principal Problem:  *Sepsis(995.91) Active Problems:  HYPERTENSION  UTI (lower urinary tract infection)  Metabolic encephalopathy  Atrial fibrillation with RVR  Hyponatremia  CAP (community acquired pneumonia)  Anemia  Subjective: C/o abd pain and swelling  Abtx:  Anti-infectives     Start     Dose/Rate Route Frequency Ordered Stop   06/18/12 1100   vancomycin (VANCOCIN) 1,500 mg in sodium chloride 0.9 % 500 mL IVPB        1,500 mg 250 mL/hr over 120 Minutes Intravenous Every 24 hours 06/17/12 1009     06/17/12 1200  piperacillin-tazobactam (ZOSYN) IVPB 3.375 g       3.375 g 12.5 mL/hr over 240 Minutes Intravenous Every 8 hours 06/17/12 1009     06/17/12 1100   vancomycin (VANCOCIN) 2,000 mg in sodium chloride 0.9 % 500 mL IVPB        2,000 mg 250 mL/hr over 120 Minutes Intravenous  Once 06/17/12 1009 06/17/12 1511   06/16/12 1845   cefTRIAXone (ROCEPHIN) 1 g in dextrose 5 % 50 mL IVPB  Status:  Discontinued        1 g 100 mL/hr over 30 Minutes Intravenous Daily-1800 06/16/12 1836 06/17/12 0937   06/16/12 1300   azithromycin (ZITHROMAX) 500 mg in dextrose 5 % 250 mL IVPB  Status:  Discontinued        500 mg 250 mL/hr over 60 Minutes Intravenous Every 24 hours 06/16/12 1203 06/17/12 0937   06/15/12 1130   cefTRIAXone (ROCEPHIN) 1 g in dextrose 5 % 50 mL IVPB        1 g 100 mL/hr over 30 Minutes Intravenous  Once 06/15/12 1126 06/15/12 1356          Medications:  Scheduled:   . acetaminophen  500 mg Oral BID  . furosemide  20 mg Intravenous BID  . insulin aspart  0-20 Units Subcutaneous TID WC  . insulin aspart  0-5 Units Subcutaneous QHS  . insulin aspart  6 Units Subcutaneous TID WC  . insulin detemir  60 Units Subcutaneous Q breakfast  . ipratropium  0.5 mg Nebulization Q6H  . levalbuterol  0.63 mg Nebulization Q6H  . levothyroxine  150 mcg Oral Daily  .  methylPREDNISolone (SOLU-MEDROL) injection  60 mg Intravenous Q12H  . metoprolol  5 mg Intravenous Once  . metoprolol  25 mg Oral BID  . piperacillin-tazobactam (ZOSYN)  IV  3.375 g Intravenous Q8H  . [EXPIRED] potassium chloride  10 mEq Intravenous Q1 Hr x 3  . [COMPLETED] potassium chloride  40 mEq Oral Daily  . simvastatin  40 mg Oral QHS  . sodium chloride  10-40 mL Intracatheter Q12H  . sodium chloride  3 mL Intravenous Q12H  . vancomycin  1,500 mg Intravenous Q24H  . [COMPLETED] vancomycin  2,000 mg Intravenous Once  . warfarin  5 mg Oral ONCE-1800  . [COMPLETED] warfarin  7.5 mg Oral ONCE-1800  . Warfarin - Pharmacist Dosing Inpatient   Does not apply q1800  . [DISCONTINUED] insulin detemir  30 Units Subcutaneous Q breakfast    Objective: Vital signs in last 24 hours: Temp:  [97.5 F (36.4 C)-98.7 F (37.1 C)] 98 F (36.7 C) (11/18 0800) Pulse Rate:  [96-136] 136  (11/18 0431) Resp:  [19-28] 21  (11/18 0431) BP: (119-142)/(57-124) 142/57 mmHg (11/18 0431) SpO2:  [93 %-100 %] 95 % (11/18 1040)  General appearance: alert, cooperative and no distress Resp: rhonchi anterior - right Cardio: regular rate and rhythm GI: normal findings: bowel sounds normal and soft, non-tender Extremities: wounds clean. bruising/ecchymosis on L foot unchanged.  Lab Results  Basename 06/18/12 0420 06/17/12 1025  WBC 14.3* 16.3*  HGB 11.1* 10.9*  HCT 33.1* 32.8*  NA 134* 134*  K 3.9 3.0*  CL 95* 96  CO2 27 26  BUN 27* 21  CREATININE 1.13* 1.04  GLU -- --   Liver Panel  Basename 06/16/12 0930  PROT 6.9  ALBUMIN 2.3*  AST 19  ALT 11  ALKPHOS 60  BILITOT 0.7  BILIDIR --  IBILI --   Sedimentation Rate No results found for this basename: ESRSEDRATE in the last 72 hours C-Reactive Protein No results found for this basename: CRP:2 in the last 72 hours  Microbiology: Recent Results (from the past 240 hour(s))  URINE CULTURE     Status: Normal   Collection Time   06/15/12  11:04 AM      Component Value Range Status Comment   Specimen Description URINE, CATHETERIZED   Final    Special Requests NONE   Final    Culture  Setup Time 06/15/2012 21:56   Final    Colony Count >=100,000 COLONIES/ML   Final    Culture     Final    Value: Multiple bacterial morphotypes present, none predominant. Suggest appropriate recollection if clinically indicated.   Report Status 06/16/2012 FINAL   Final   CULTURE, BLOOD (ROUTINE X 2)     Status: Normal   Collection Time   06/15/12  4:30 PM      Component Value Range Status Comment   Specimen Description BLOOD LEFT HAND   Final    Special Requests BOTTLES DRAWN AEROBIC ONLY 10CC   Final    Culture  Setup Time 06/16/2012 01:07   Final    Culture     Final    Value: STREPTOCOCCUS PNEUMONIAE     Note: Gram Stain Report Called to,Read Back By and Verified With: DAWN LARSON 06/16/12 @ 2:40PM BY RUSCA.   Report Status 06/18/2012 FINAL   Final    Organism ID, Bacteria STREPTOCOCCUS PNEUMONIAE   Final   CULTURE, BLOOD (ROUTINE X 2)     Status: Normal   Collection Time   06/15/12  4:50 PM      Component Value Range Status Comment   Specimen Description BLOOD RIGHT ARM   Final    Special Requests BOTTLES DRAWN AEROBIC AND ANAEROBIC 10CC   Final    Culture  Setup Time 06/16/2012 01:07   Final    Culture     Final    Value: STREPTOCOCCUS PNEUMONIAE     Note: SUSCEPTIBILITIES PERFORMED ON PREVIOUS CULTURE WITHIN THE LAST 5 DAYS.     Note: Gram Stain Report Called to,Read Back By and Verified With: TIVET WOODS 06/16/12 @ 9:20PM BY RUSCA.   Report Status 06/18/2012 FINAL   Final   CULTURE, BLOOD (ROUTINE X 2)     Status: Normal (Preliminary result)   Collection Time   06/16/12  7:35 PM      Component Value Range Status Comment   Specimen Description BLOOD RIGHT ARM  5 ML IN [facility] BOTTLE   Final    Special Requests Normal   Final    Culture  Setup Time 06/17/2012 13:15   Final    Culture     Final    Value: GRAM POSITIVE COCCI  IN  PAIRS     Note: Gram Stain Report Called to,Read Back By and Verified With: Mort Sawyers RN on 06/18/12 at 05:35 by Christie Nottingham   Report Status PENDING   Incomplete   CULTURE, BLOOD (ROUTINE X 2)     Status: Normal (Preliminary result)   Collection Time   06/16/12  7:40 PM      Component Value Range Status Comment   Specimen Description BLOOD RIGHT ARM  5 ML IN [facility] BOTTLE   Final    Special Requests Normal   Final    Culture  Setup Time 06/17/2012 13:15   Final    Culture     Final    Value:        BLOOD CULTURE RECEIVED NO GROWTH TO DATE CULTURE WILL BE HELD FOR 5 DAYS BEFORE ISSUING A FINAL NEGATIVE REPORT   Report Status PENDING   Incomplete   MRSA PCR SCREENING     Status: Normal   Collection Time   06/16/12  8:32 PM      Component Value Range Status Comment   MRSA by PCR NEGATIVE  NEGATIVE Final   CULTURE, BLOOD (ROUTINE X 2)     Status: Normal (Preliminary result)   Collection Time   06/17/12  3:16 PM      Component Value Range Status Comment   Specimen Description BLOOD RIGHT HAND   Final    Special Requests BOTTLES DRAWN AEROBIC AND ANAEROBIC 5CC EACH   Final    Culture  Setup Time 06/17/2012 18:39   Final    Culture     Final    Value:        BLOOD CULTURE RECEIVED NO GROWTH TO DATE CULTURE WILL BE HELD FOR 5 DAYS BEFORE ISSUING A FINAL NEGATIVE REPORT   Report Status PENDING   Incomplete   CULTURE, BLOOD (ROUTINE X 2)     Status: Normal (Preliminary result)   Collection Time   06/17/12  3:19 PM      Component Value Range Status Comment   Specimen Description BLOOD RIGHT ARM   Final    Special Requests BOTTLES DRAWN AEROBIC AND ANAEROBIC 3CC EACH   Final    Culture  Setup Time 06/17/2012 18:39   Final    Culture     Final    Value:        BLOOD CULTURE RECEIVED NO GROWTH TO DATE CULTURE WILL BE HELD FOR 5 DAYS BEFORE ISSUING A FINAL NEGATIVE REPORT   Report Status PENDING   Incomplete   CULTURE, EXPECTORATED SPUTUM-ASSESSMENT     Status: Normal   Collection Time    06/18/12  4:33 AM      Component Value Range Status Comment   Specimen Description SPUTUM   Final    Special Requests Normal   Final    Sputum evaluation     Final    Value: THIS SPECIMEN IS ACCEPTABLE. RESPIRATORY CULTURE REPORT TO FOLLOW.   Report Status 06/18/2012 FINAL   Final     Studies/Results: Ct Chest W Contrast  06/16/2012  *RADIOLOGY REPORT*  Clinical Data: Cough and crackles.  Leukocytosis.  CT CHEST WITH CONTRAST  Technique:  Multidetector CT imaging of the chest was performed following the standard protocol during bolus administration of intravenous contrast.  Contrast: OMNIPAQUE IOHEXOL 300 MG/ML  SOLN  Comparison: Chest CT 01/14/2005.  Findings:  Mediastinum: Heart size is mildly enlarged. There is no significant pericardial fluid, thickening or pericardial calcification. There is  atherosclerosis of the thoracic aorta, the great vessels of the mediastinum and the coronary arteries, including calcified atherosclerotic plaque in the left main, left anterior descending and right coronary arteries. There are numerous borderline enlarged and mildly enlarged mediastinal lymph nodes, largest of which measures up to 1.2 cm in short axis in the right paratracheal station.  Esophagus is unremarkable in appearance.  Lungs/Pleura: Extensive air space consolidation is noted in the apex of the left upper lobe and the posterior aspect of the right middle lobe, concerning for multilobar pneumonia.  Mild diffuse bronchial wall thickening is noted.  Trace bilateral pleural effusions and some dependent atelectasis in the lower lobes of the lungs bilaterally.  Mild bilateral lower lobe cylindrical bronchiectasis. No definite suspicious appearing pulmonary nodules.  Upper Abdomen: Linear areas of ill-defined low attenuation in the liver adjacent to the fissure for the ligamentum venosum is likely to represent focal fatty infiltration.  Nonspecific stranding in the left upper quadrant adjacent to the  upper pole of the left kidney.  Musculoskeletal: There are no aggressive appearing lytic or blastic lesions noted in the visualized portions of the skeleton.  IMPRESSION: 1.  Multilobar pneumonia in the apex of the left upper lobe and posterior aspect of the right middle lobe (lateral segment). 2.  Trace bilateral pleural effusions. 3.  Mild cylindrical bronchiectasis in the lower lobes of the lungs bilaterally. 4.  Nonspecific stranding in the left upper quadrant of the abdomen.  This could be related to baseline renal insufficiency, however, clinical correlation for signs and symptoms of pancreatitis may be warranted.  If there is any elevation of lipase levels, further evaluation with dedicated abdominal and pelvic imaging may be warranted.  5.  Atherosclerosis, including left main and two-vessel coronary artery disease.   Original Report Authenticated By: Trudie Reed, M.D.    Dg Chest Port 1 View  06/17/2012  *RADIOLOGY REPORT*  Clinical Data: PICC line adjustment  PORTABLE CHEST - 1 VIEW  Comparison: Portable exam 2000 hours compared to 1830 hours  Findings: Tip of right arm PICC line projects over SVC above cavoatrial junction. Upper normal heart size. Atherosclerotic calcification aorta. Persistent right middle lobe and left upper lobe infiltrates. Right glenohumeral degenerative changes noted. No pleural effusion or pneumothorax.  IMPRESSION: Tip of right arm PICC line projects over SVC above cavoatrial junction. Persistent pulmonary infiltrates.   Original Report Authenticated By: Ulyses Southward, M.D.    Dg Chest Port 1 View  06/17/2012  *RADIOLOGY REPORT*  Clinical Data: Line placement.  PICC placement.  PORTABLE CHEST - 1 VIEW  Comparison: 06/16/2012.  Findings: New right upper extremity PICC is present with the tip in the right brachiocephalic vein.  Low volume chest.  Lungs appear unchanged allowing for lower volumes.  IMPRESSION: New right upper extremity PICC with the tip in the right  brachiocephalic vein.  Line should be advanced 8 cm for positioning at the cavoatrial junction.   Original Report Authenticated By: Andreas Newport, M.D.      Assessment/Plan:  Bacteremia- Pneumococcal UTI (?Urosepsis)- Cx polymicrobial.  Pneumonia  Diabetic Foot Day 4 anbx (vanco/zosyn) No change in anbx Appreciate WOC eval.  Her foot looks much better (? Bruise from fall).  Out of unit? Repeat BCx 11-16 + If she continues to improve will change her to ceftriaxone alone.  Await ABI   [name] Infectious Diseases [phone] 06/18/2012, 1:25 PM   LOS: 3 days

## 2012-06-18 NOTE — Progress Notes (Signed)
ANTICOAGULATION CONSULT NOTE - Follow Up  Pharmacy Consult for Warfarin Indication: Afib  Allergies  Allergen Reactions  . Insulin Glargine     Itching   . Latex     Rash   . Metformin And Related     Severe diarrhea   . Metoclopramide Hcl     REACTION: unspecified  . Nsaids     REACTION: unspecified  . Promethazine Hcl     REACTION: unspecified    Patient Measurements: Height: 5\' 7"  (170.2 cm) Weight: 245 lb 13 oz (111.5 kg) IBW/kg (Calculated) : 61.6   Vital Signs: Temp: 98 F (36.7 C) (11/18 0800) Temp src: Oral (11/18 0800) BP: 142/57 mmHg (11/18 0431) Pulse Rate: 136  (11/18 0431)  Labs:  Basename 06/18/12 0420 06/17/12 1025 06/17/12 0400 06/16/12 0930 06/16/12 0929 06/16/12 0635  HGB 11.1* 10.9* -- -- -- --  HCT 33.1* 32.8* -- -- -- 34.2*  PLT 365 314 -- -- -- 245  APTT -- -- -- -- -- --  LABPROT 19.2* -- 16.4* -- 19.8* --  INR 1.68* -- 1.35 -- 1.75* --  HEPARINUNFRC -- -- -- -- -- --  CREATININE 1.13* 1.04 -- 0.95 -- --  CKTOTAL -- -- -- -- -- --  CKMB -- -- -- -- -- --  TROPONINI -- -- -- -- -- --    Estimated Creatinine Clearance: 45.2 ml/min (by C-G formula based on Cr of 1.13).  Medications:  Scheduled:     . acetaminophen  500 mg Oral BID  . furosemide  20 mg Intravenous BID  . insulin aspart  0-20 Units Subcutaneous TID WC  . insulin aspart  0-5 Units Subcutaneous QHS  . insulin aspart  6 Units Subcutaneous TID WC  . insulin detemir  30 Units Subcutaneous Q breakfast  . ipratropium  0.5 mg Nebulization Q6H  . levalbuterol  0.63 mg Nebulization Q6H  . levothyroxine  150 mcg Oral Daily  . methylPREDNISolone (SOLU-MEDROL) injection  60 mg Intravenous Q12H  . metoprolol  5 mg Intravenous Once  . metoprolol  25 mg Oral BID  . piperacillin-tazobactam (ZOSYN)  IV  3.375 g Intravenous Q8H  . [EXPIRED] potassium chloride  10 mEq Intravenous Q1 Hr x 3  . potassium chloride  40 mEq Oral Daily  . simvastatin  40 mg Oral QHS  . sodium chloride   10-40 mL Intracatheter Q12H  . sodium chloride  3 mL Intravenous Q12H  . vancomycin  1,500 mg Intravenous Q24H  . [COMPLETED] vancomycin  2,000 mg Intravenous Once  . [COMPLETED] warfarin  7.5 mg Oral ONCE-1800  . Warfarin - Pharmacist Dosing Inpatient   Does not apply q1800  . [DISCONTINUED] azithromycin  500 mg Intravenous Q24H  . [DISCONTINUED] cefTRIAXone (ROCEPHIN) IVPB 1 gram/50 mL D5W  1 g Intravenous q1800   Infusions:     . sodium chloride 10 mL/hr at 06/16/12 1800    Assessment:  [name] on wafarin PTA for afib with RVR.  Admit 11/15 with sepsis  PTA warfarin dose:  5mg  Tues, Thurs, Sat, 2.5mg  on Mon, Wed, Fri, Sun.  Last dose taken 11/13, appears to have missed dose on 11/14.  INR was therapeutic on admission at 2.11, but now is subtherapeutic but starting to rise again   No bleeding reported in chart notes  CBC stable  Goal of Therapy:  INR 2-3 Monitor platelets by anticoagulation protocol: Yes   Plan:   Warfarin 5mg  PO x1 dose at 1800  Daily INR, CBC  Hessie Knows, PharmD, BCPS Pager [phone] 06/18/2012 9:18 AM

## 2012-06-18 NOTE — Progress Notes (Signed)
[facility] PROGRESS NOTE  Cairo Coccaro Kaner ZOX:[id number] DOB: [birth year] DOA: 06/15/2012 PCP: [name], MD  Assessment/Plan: 1. CAP/ Gram positive cocci bacteremia/ UTI:  - Patient was transferred to step down yesterday, for respiratory distress and  for closer monitoring.  - she was started on broad spectrum antibiotics vancomycin and zosyn for multilobe pneumonia. Vancomycin should cover the gram positive bacteremia. vanco and zosyn should cover the UTI. We have ordered 2 d Echo to evaluate for possible endocarditis and for evaluation of CHF. Repeated blood cultures pending. Appreciate ID recommendations.   2. Acute respiratory failure: probably from combination of multilobar pneumonia and congestive heart failure.  Her pro bnp is elevated, and she diuresed about 3.5 lit. Started her on IV lasix 20 mg BID and repeat pro bnp in am. Echo pending.   3. Hypokalemia: will be repleted as needed.   4. Metabolic encephalopathy: resolved.   5. Atrial fibrillation with RVR; resolved. Probably from the infection. Continue with metoprolol and coumadin, dosing as per pharmacy.   6. Hyponatremia: improving.   7. Anemia: normocytic. Probably from AOCD.  8. Diabetes Mellitus: CBG (last 3)   Basename 06/18/12 0828 06/17/12 2208 06/17/12 1908  GLUCAP 278* 326* 314*    Increase levemir to 60 units  and SSI.   9. DVT prophylaxis: on coumadin.   Code Status: full code Family Communication: none at  bedside Disposition Plan: pending, in 2 days.   Antibiotics:  Rocephin and zithromax 11/15 to 11/16  Vancomycin and zosyn from 11/17>>  HPI/Subjective: FEELING MUCH BETTER. Would like to get out of bed and get a bath.   Objective: Filed Vitals:   06/18/12 0245 06/18/12 0400 06/18/12 0431 06/18/12 0800  BP:   142/57   Pulse:   136   Temp:  98.1 F (36.7 C)  98 F (36.7 C)  TempSrc:  Oral  Oral  Resp:   21   Height:      Weight:      SpO2: 100%  95%     Intake/Output  Summary (Last 24 hours) at 06/18/12 1023 Last data filed at 06/18/12 0800  Gross per 24 hour  Intake    940 ml  Output   1900 ml  Net   -960 ml   Filed Weights   06/15/12 1622  Weight: 111.5 kg (245 lb 13 oz)    Exam:   General:  Alert afebrile comfortable  Cardiovascular: s1 s2, slightly tachycardic.   Respiratory: scattered rhonchi and wheezing present.   Abdomen: soft NT ND BS+  Data Reviewed: Basic Metabolic Panel:  Lab 06/18/12 8119 06/17/12 1025 06/16/12 0930 06/15/12 1105  NA 134* 134* 133* 131*  K 3.9 3.0* 3.6 3.9  CL 95* 96 97 92*  CO2 27 26 21 24   GLUCOSE 255* 197* 243* 90  BUN 27* 21 20 18   CREATININE 1.13* 1.04 0.95 0.99  CALCIUM 8.8 8.5 8.3* 9.3  MG -- -- -- --  PHOS -- -- -- --   Liver Function Tests:  Lab 06/16/12 0930  AST 19  ALT 11  ALKPHOS 60  BILITOT 0.7  PROT 6.9  ALBUMIN 2.3*   No results found for this basename: LIPASE:5,AMYLASE:5 in the last 168 hours No results found for this basename: AMMONIA:5 in the last 168 hours CBC:  Lab 06/18/12 0420 06/17/12 1025 06/16/12 0635 06/15/12 1105  WBC 14.3* 16.3* 24.5* 22.7*  NEUTROABS -- -- -- 19.1*  HGB 11.1* 10.9* 11.4* 12.3  HCT 33.1* 32.8* 34.2* 35.2*  MCV 91.7 91.1 92.2 90.7  PLT 365 314 245 309   Cardiac Enzymes: No results found for this basename: CKTOTAL:5,CKMB:5,CKMBINDEX:5,TROPONINI:5 in the last 168 hours BNP (last 3 results)  Basename 06/16/12 1948 10/15/11 1202  PROBNP 3124.0* 1234.0*   CBG:  Lab 06/18/12 0828 06/17/12 2208 06/17/12 1908 06/17/12 1425 06/17/12 1224  GLUCAP 278* 326* 314* 191* 176*    Recent Results (from the past 240 hour(s))  URINE CULTURE     Status: Normal   Collection Time   06/15/12 11:04 AM      Component Value Range Status Comment   Specimen Description URINE, CATHETERIZED   Final    Special Requests NONE   Final    Culture  Setup Time 06/15/2012 21:56   Final    Colony Count >=100,000 COLONIES/ML   Final    Culture     Final    Value:  Multiple bacterial morphotypes present, none predominant. Suggest appropriate recollection if clinically indicated.   Report Status 06/16/2012 FINAL   Final   CULTURE, BLOOD (ROUTINE X 2)     Status: Normal   Collection Time   06/15/12  4:30 PM      Component Value Range Status Comment   Specimen Description BLOOD LEFT HAND   Final    Special Requests BOTTLES DRAWN AEROBIC ONLY 10CC   Final    Culture  Setup Time 06/16/2012 01:07   Final    Culture     Final    Value: STREPTOCOCCUS PNEUMONIAE     Note: Gram Stain Report Called to,Read Back By and Verified With: DAWN LARSON 06/16/12 @ 2:40PM BY RUSCA.   Report Status 06/18/2012 FINAL   Final    Organism ID, Bacteria STREPTOCOCCUS PNEUMONIAE   Final   CULTURE, BLOOD (ROUTINE X 2)     Status: Normal   Collection Time   06/15/12  4:50 PM      Component Value Range Status Comment   Specimen Description BLOOD RIGHT ARM   Final    Special Requests BOTTLES DRAWN AEROBIC AND ANAEROBIC 10CC   Final    Culture  Setup Time 06/16/2012 01:07   Final    Culture     Final    Value: STREPTOCOCCUS PNEUMONIAE     Note: SUSCEPTIBILITIES PERFORMED ON PREVIOUS CULTURE WITHIN THE LAST 5 DAYS.     Note: Gram Stain Report Called to,Read Back By and Verified With: TIVET WOODS 06/16/12 @ 9:20PM BY RUSCA.   Report Status 06/18/2012 FINAL   Final   CULTURE, BLOOD (ROUTINE X 2)     Status: Normal (Preliminary result)   Collection Time   06/16/12  7:35 PM      Component Value Range Status Comment   Specimen Description BLOOD RIGHT ARM  5 ML IN [facility] BOTTLE   Final    Special Requests Normal   Final    Culture  Setup Time 06/17/2012 13:15   Final    Culture     Final    Value: GRAM POSITIVE COCCI IN PAIRS     Note: Gram Stain Report Called to,Read Back By and Verified With: Mort Sawyers RN on 06/18/12 at 05:35 by Christie Nottingham   Report Status PENDING   Incomplete   CULTURE, BLOOD (ROUTINE X 2)     Status: Normal (Preliminary result)   Collection Time    06/16/12  7:40 PM      Component Value Range Status Comment   Specimen Description BLOOD RIGHT ARM  5 ML  IN [facility] BOTTLE   Final    Special Requests Normal   Final    Culture  Setup Time 06/17/2012 13:15   Final    Culture     Final    Value:        BLOOD CULTURE RECEIVED NO GROWTH TO DATE CULTURE WILL BE HELD FOR 5 DAYS BEFORE ISSUING A FINAL NEGATIVE REPORT   Report Status PENDING   Incomplete   MRSA PCR SCREENING     Status: Normal   Collection Time   06/16/12  8:32 PM      Component Value Range Status Comment   MRSA by PCR NEGATIVE  NEGATIVE Final   CULTURE, BLOOD (ROUTINE X 2)     Status: Normal (Preliminary result)   Collection Time   06/17/12  3:16 PM      Component Value Range Status Comment   Specimen Description BLOOD RIGHT HAND   Final    Special Requests BOTTLES DRAWN AEROBIC AND ANAEROBIC 5CC EACH   Final    Culture  Setup Time 06/17/2012 18:39   Final    Culture     Final    Value:        BLOOD CULTURE RECEIVED NO GROWTH TO DATE CULTURE WILL BE HELD FOR 5 DAYS BEFORE ISSUING A FINAL NEGATIVE REPORT   Report Status PENDING   Incomplete   CULTURE, BLOOD (ROUTINE X 2)     Status: Normal (Preliminary result)   Collection Time   06/17/12  3:19 PM      Component Value Range Status Comment   Specimen Description BLOOD RIGHT ARM   Final    Special Requests BOTTLES DRAWN AEROBIC AND ANAEROBIC 3CC EACH   Final    Culture  Setup Time 06/17/2012 18:39   Final    Culture     Final    Value:        BLOOD CULTURE RECEIVED NO GROWTH TO DATE CULTURE WILL BE HELD FOR 5 DAYS BEFORE ISSUING A FINAL NEGATIVE REPORT   Report Status PENDING   Incomplete   CULTURE, EXPECTORATED SPUTUM-ASSESSMENT     Status: Normal   Collection Time   06/18/12  4:33 AM      Component Value Range Status Comment   Specimen Description SPUTUM   Final    Special Requests Normal   Final    Sputum evaluation     Final    Value: THIS SPECIMEN IS ACCEPTABLE. RESPIRATORY CULTURE REPORT TO FOLLOW.   Report Status  06/18/2012 FINAL   Final      Studies: Ct Chest W Contrast  06/16/2012  *RADIOLOGY REPORT*  Clinical Data: Cough and crackles.  Leukocytosis.  CT CHEST WITH CONTRAST  Technique:  Multidetector CT imaging of the chest was performed following the standard protocol during bolus administration of intravenous contrast.  Contrast: OMNIPAQUE IOHEXOL 300 MG/ML  SOLN  Comparison: Chest CT 01/14/2005.  Findings:  Mediastinum: Heart size is mildly enlarged. There is no significant pericardial fluid, thickening or pericardial calcification. There is atherosclerosis of the thoracic aorta, the great vessels of the mediastinum and the coronary arteries, including calcified atherosclerotic plaque in the left main, left anterior descending and right coronary arteries. There are numerous borderline enlarged and mildly enlarged mediastinal lymph nodes, largest of which measures up to 1.2 cm in short axis in the right paratracheal station.  Esophagus is unremarkable in appearance.  Lungs/Pleura: Extensive air space consolidation is noted in the apex of the left upper lobe and  the posterior aspect of the right middle lobe, concerning for multilobar pneumonia.  Mild diffuse bronchial wall thickening is noted.  Trace bilateral pleural effusions and some dependent atelectasis in the lower lobes of the lungs bilaterally.  Mild bilateral lower lobe cylindrical bronchiectasis. No definite suspicious appearing pulmonary nodules.  Upper Abdomen: Linear areas of ill-defined low attenuation in the liver adjacent to the fissure for the ligamentum venosum is likely to represent focal fatty infiltration.  Nonspecific stranding in the left upper quadrant adjacent to the upper pole of the left kidney.  Musculoskeletal: There are no aggressive appearing lytic or blastic lesions noted in the visualized portions of the skeleton.  IMPRESSION: 1.  Multilobar pneumonia in the apex of the left upper lobe and posterior aspect of the right middle  lobe (lateral segment). 2.  Trace bilateral pleural effusions. 3.  Mild cylindrical bronchiectasis in the lower lobes of the lungs bilaterally. 4.  Nonspecific stranding in the left upper quadrant of the abdomen.  This could be related to baseline renal insufficiency, however, clinical correlation for signs and symptoms of pancreatitis may be warranted.  If there is any elevation of lipase levels, further evaluation with dedicated abdominal and pelvic imaging may be warranted.  5.  Atherosclerosis, including left main and two-vessel coronary artery disease.   Original Report Authenticated By: Trudie Reed, M.D.    Dg Chest Port 1 View  06/17/2012  *RADIOLOGY REPORT*  Clinical Data: PICC line adjustment  PORTABLE CHEST - 1 VIEW  Comparison: Portable exam 2000 hours compared to 1830 hours  Findings: Tip of right arm PICC line projects over SVC above cavoatrial junction. Upper normal heart size. Atherosclerotic calcification aorta. Persistent right middle lobe and left upper lobe infiltrates. Right glenohumeral degenerative changes noted. No pleural effusion or pneumothorax.  IMPRESSION: Tip of right arm PICC line projects over SVC above cavoatrial junction. Persistent pulmonary infiltrates.   Original Report Authenticated By: Ulyses Southward, M.D.    Dg Chest Port 1 View  06/17/2012  *RADIOLOGY REPORT*  Clinical Data: Line placement.  PICC placement.  PORTABLE CHEST - 1 VIEW  Comparison: 06/16/2012.  Findings: New right upper extremity PICC is present with the tip in the right brachiocephalic vein.  Low volume chest.  Lungs appear unchanged allowing for lower volumes.  IMPRESSION: New right upper extremity PICC with the tip in the right brachiocephalic vein.  Line should be advanced 8 cm for positioning at the cavoatrial junction.   Original Report Authenticated By: Andreas Newport, M.D.     Scheduled Meds:    . acetaminophen  500 mg Oral BID  . furosemide  20 mg Intravenous BID  . insulin aspart  0-20  Units Subcutaneous TID WC  . insulin aspart  0-5 Units Subcutaneous QHS  . insulin aspart  6 Units Subcutaneous TID WC  . insulin detemir  60 Units Subcutaneous Q breakfast  . ipratropium  0.5 mg Nebulization Q6H  . levalbuterol  0.63 mg Nebulization Q6H  . levothyroxine  150 mcg Oral Daily  . methylPREDNISolone (SOLU-MEDROL) injection  60 mg Intravenous Q12H  . metoprolol  5 mg Intravenous Once  . metoprolol  25 mg Oral BID  . piperacillin-tazobactam (ZOSYN)  IV  3.375 g Intravenous Q8H  . [EXPIRED] potassium chloride  10 mEq Intravenous Q1 Hr x 3  . potassium chloride  40 mEq Oral Daily  . simvastatin  40 mg Oral QHS  . sodium chloride  10-40 mL Intracatheter Q12H  . sodium chloride  3 mL Intravenous  Q12H  . vancomycin  1,500 mg Intravenous Q24H  . [COMPLETED] vancomycin  2,000 mg Intravenous Once  . warfarin  5 mg Oral ONCE-1800  . [COMPLETED] warfarin  7.5 mg Oral ONCE-1800  . Warfarin - Pharmacist Dosing Inpatient   Does not apply q1800  . [DISCONTINUED] insulin detemir  30 Units Subcutaneous Q breakfast   Continuous Infusions:    . sodium chloride 10 mL/hr at 06/16/12 1800    Principal Problem:  *Sepsis(995.91) Active Problems:  HYPERTENSION  UTI (lower urinary tract infection)  Metabolic encephalopathy  Atrial fibrillation with RVR  Hyponatremia  CAP (community acquired pneumonia)  Anemia        Symir Mah  [facility] Pager [phone]. If 8PM-8AM, please contact night-coverage at [email or web address], password [facility] 06/18/2012, 10:23 AM  LOS: 3 days

## 2012-06-18 NOTE — Evaluation (Signed)
Occupational Therapy Evaluation Patient Details Name: [name] MRN: [id number] DOB: 09-Jan-1923 Today's Date: 06/18/2012 Time: 0865-7846 OT Time Calculation (min): 33 min  OT Assessment / Plan / Recommendation Clinical Impression  78 yo female admitted with AMS and UTI who is in step down unit for closer cardiopulmonary monitoring. Pt remained tachycardic throughout session with HR ranging from 120s-150s. Skilled OT recommended to maximize independence with BADls to min A level in prep for d/c to next venue of care.    OT Assessment  Patient needs continued OT Services    Follow Up Recommendations  SNF    Barriers to Discharge Decreased caregiver support    Equipment Recommendations  None recommended by OT    Recommendations for Other Services    Frequency  Min 2X/week    Precautions / Restrictions Precautions Precautions: Fall Precaution Comments: Monitor HR, pt got very tachy during session Restrictions Weight Bearing Restrictions: No   Pertinent Vitals/Pain Denied pain    ADL  Grooming: Simulated;Set up Where Assessed - Grooming: Unsupported sitting Upper Body Bathing: Simulated;Minimal assistance Where Assessed - Upper Body Bathing: Unsupported sitting Lower Body Bathing: Simulated;Maximal assistance Where Assessed - Lower Body Bathing: Supported sit to stand Upper Body Dressing: Simulated;Minimal assistance Where Assessed - Upper Body Dressing: Unsupported sitting Lower Body Dressing: Simulated;Maximal assistance Where Assessed - Lower Body Dressing: Supported sit to stand Toilet Transfer: Simulated;Minimal assistance Toilet Transfer Method: Stand pivot Toilet Transfer Equipment: Other (comment) (recliner) Toileting - Clothing Manipulation and Hygiene: Simulated;Moderate assistance Where Assessed - Toileting Clothing Manipulation and Hygiene: Standing Equipment Used: Rolling walker Transfers/Ambulation Related to ADLs: Pt ambulated a few feet to the  chair. HR remained in the 120s-150s throughout. Pt asymptomatic. Pts HR elevates even when she speaks. ADL Comments: Pt tends to self direct session. Does not like to be touched during mobility. Requires constant cues for safety.    OT Diagnosis: Generalized weakness  OT Problem List: Decreased activity tolerance;Decreased safety awareness;Decreased knowledge of use of DME or AE;Cardiopulmonary status limiting activity OT Treatment Interventions: Self-care/ADL training;Therapeutic activities;DME and/or AE instruction;Patient/family education   OT Goals Acute Rehab OT Goals OT Goal Formulation: With patient Time For Goal Achievement: 06/18/12 Potential to Achieve Goals: Good ADL Goals Pt Will Perform Grooming: Standing at sink;Other (comment) (with minguard A) ADL Goal: Grooming - Progress: Goal set today Pt Will Transfer to Toilet: with supervision;Ambulation;Comfort height toilet;3-in-1;Stand pivot transfer ADL Goal: Toilet Transfer - Progress: Goal set today Pt Will Perform Toileting - Clothing Manipulation: with min assist;Sitting on 3-in-1 or toilet;Standing ADL Goal: Toileting - Clothing Manipulation - Progress: Goal set today Pt Will Perform Toileting - Hygiene: with min assist;Sit to stand from 3-in-1/toilet ADL Goal: Toileting - Hygiene - Progress: Goal set today Additional ADL Goal #1: Pt will complete all aspects of bathing and dressing with setup/min A. ADL Goal: Additional Goal #1 - Progress: Goal set today  Visit Information  Last OT Received On: 06/18/12 Assistance Needed: +2 PT/OT Co-Evaluation/Treatment: Yes    Subjective Data  Subjective: Ive fallen a lot.... Patient Stated Goal: Not asked.   Prior Functioning     Home Living Lives With: Spouse Available Help at Discharge: Family Type of Home: House Home Access: Ramped entrance Home Layout: One level Bathroom Shower/Tub: Door Firefighter: Standard Home Adaptive Equipment: Walker - rolling (Pt states  she spongebathes) Prior Function Level of Independence: Needs assistance Needs Assistance: Light Housekeeping Light Housekeeping: Total Comments: Pt reports her husband has been sick also and cannot help take care  of her. Communication Communication: HOH Dominant Hand: Right         Vision/Perception     Cognition  Overall Cognitive Status: Appears within functional limits for tasks assessed/performed Arousal/Alertness: Awake/alert Orientation Level: Appears intact for tasks assessed Behavior During Session: [facility] for tasks performed Cognition - Other Comments: needs frequent redirection to task.  Pt wants to do things "her way"    Extremity/Trunk Assessment Right Upper Extremity Assessment RUE ROM/Strength/Tone: [facility] for tasks assessed Left Upper Extremity Assessment LUE ROM/Strength/Tone: [facility] for tasks assessed     Mobility Bed Mobility Bed Mobility: Supine to Sit;Sitting - Scoot to Edge of Bed Supine to Sit: 4: Min assist Sitting - Scoot to [place] of Bed: 4: Min assist Details for Bed Mobility Assistance: Pt requires some assist for steadying trunk and to ensure safety of LEs getting to EOB.  cues for hand placement and technique.  Noted that pts HR increased to 157 with bed mobility.   Transfers Sit to Stand: 1: +2 Total assist;With upper extremity assist;From bed Sit to Stand: Patient Percentage: 70% Stand to Sit: 1: +2 Total assist;With armrests;To chair/3-in-1 Stand to Sit: Patient Percentage: 70% Details for Transfer Assistance: Assist to rise and stabalize with cues for hand placement, safety and to ensure controlled descent.      Shoulder Instructions     Exercise     Balance Balance Balance Assessed: Yes Static Sitting Balance Static Sitting - Balance Support: No upper extremity supported;Feet supported Static Sitting - Level of Assistance: 5: Stand by assistance   End of Session OT - End of Session Activity Tolerance: Patient limited by fatigue  GO      Savahna Casados A OTR/L [phone] 06/18/2012, 2:37 PM

## 2012-06-18 NOTE — Consult Note (Signed)
WOC consult Note Reason for Consult:traumatic injury to 4th and 5th digits left foot; .8cm tear at base of 4th digit Wound type:traumatic injury  (Patient has sustained several falls and has walks without protective footwear (shoes) at home.) Pressure Ulcer POA: No Measurement:ecchymosis measures 4cm x 5cm and covered 4th and 5th digits on left foot.  There is a small cur at the posterior base of the 4th toe.  It was cleasned this am by her nurse and is clean and not bleeding. Wound UJW:JXBJY, pink Drainage (amount, consistency, odor) none Periwound:ecchymotic, as above Dressing procedure/placement/frequency:daily cleanse with placement of gauze followed by sock.  Patient, husband and step-daughter taught that patient needs to wear protective footwear and to have assistance as she ambulated to the bathroom. I will not follow.  Please re-consult if needed. Thanks, Ladona Mow, MSN, RN, [facility], CWOCN [phone])

## 2012-06-19 DIAGNOSIS — L97909 Non-pressure chronic ulcer of unspecified part of unspecified lower leg with unspecified severity: Secondary | ICD-10-CM

## 2012-06-19 DIAGNOSIS — D649 Anemia, unspecified: Secondary | ICD-10-CM

## 2012-06-19 LAB — CBC
HCT: 33.8 % — ABNORMAL LOW (ref 36.0–46.0)
Hemoglobin: 11.1 g/dL — ABNORMAL LOW (ref 12.0–15.0)
MCHC: 32.8 g/dL (ref 30.0–36.0)
MCV: 93.4 fL (ref 78.0–100.0)
RDW: 13.5 % (ref 11.5–15.5)

## 2012-06-19 LAB — BASIC METABOLIC PANEL
BUN: 37 mg/dL — ABNORMAL HIGH (ref 6–23)
Chloride: 96 mEq/L (ref 96–112)
Creatinine, Ser: 1.2 mg/dL — ABNORMAL HIGH (ref 0.50–1.10)
GFR calc Af Amer: 46 mL/min — ABNORMAL LOW (ref >90)
GFR calc non Af Amer: 39 mL/min — ABNORMAL LOW (ref >90)
Glucose, Bld: 341 mg/dL — ABNORMAL HIGH (ref 70–99)
Potassium: 3.7 mEq/L (ref 3.5–5.1)

## 2012-06-19 LAB — GLUCOSE, CAPILLARY
Glucose-Capillary: 310 mg/dL — ABNORMAL HIGH (ref 70–99)
Glucose-Capillary: 281 mg/dL — ABNORMAL HIGH (ref 70–99)
Glucose-Capillary: 367 mg/dL — ABNORMAL HIGH (ref 70–99)

## 2012-06-19 LAB — PROTIME-INR: INR: 1.85 — ABNORMAL HIGH (ref 0.00–1.49)

## 2012-06-19 MED ORDER — DEXTROSE 5 % IV SOLN
2.0000 g | INTRAVENOUS | Status: DC
  Administered 2012-06-20 – 2012-06-21 (×2): 2 g via INTRAVENOUS
  Filled 2012-06-19 (×2): qty 2

## 2012-06-19 MED ORDER — FUROSEMIDE 20 MG PO TABS
20.0000 mg | ORAL_TABLET | Freq: Two times a day (BID) | ORAL | Status: DC
  Administered 2012-06-19 – 2012-06-21 (×4): 20 mg via ORAL
  Filled 2012-06-19 (×7): qty 1

## 2012-06-19 MED ORDER — WARFARIN SODIUM 5 MG PO TABS
5.0000 mg | ORAL_TABLET | Freq: Once | ORAL | Status: AC
  Administered 2012-06-19: 5 mg via ORAL
  Filled 2012-06-19: qty 1

## 2012-06-19 NOTE — Progress Notes (Signed)
ANTICOAGULATION CONSULT NOTE - Follow Up  Pharmacy Consult for Warfarin Indication: Afib  Allergies  Allergen Reactions  . Insulin Glargine     Itching   . Latex     Rash   . Metformin And Related     Severe diarrhea   . Metoclopramide Hcl     REACTION: unspecified  . Nsaids     REACTION: unspecified  . Promethazine Hcl     REACTION: unspecified    Patient Measurements: Height: 5\' 2"  (157.5 cm) Weight: 247 lb 9.2 oz (112.3 kg) IBW/kg (Calculated) : 50.1   Vital Signs: Temp: 97.8 F (36.6 C) (11/19 0503) Temp src: Oral (11/19 0503) BP: 140/89 mmHg (11/19 0503) Pulse Rate: 86  (11/19 0503)  Labs:  Basename 06/19/12 0505 06/18/12 0420 06/17/12 1025 06/17/12 0400  HGB 11.1* 11.1* -- --  HCT 33.8* 33.1* 32.8* --  PLT 439* 365 314 --  APTT -- -- -- --  LABPROT 20.7* 19.2* -- 16.4*  INR 1.85* 1.68* -- 1.35  HEPARINUNFRC -- -- -- --  CREATININE 1.20* 1.13* 1.04 --  CKTOTAL -- -- -- --  CKMB -- -- -- --  TROPONINI -- -- -- --    Estimated Creatinine Clearance: 39.1 ml/min (by C-G formula based on Cr of 1.2).   Assessment:  89 YOF on wafarin PTA for afib with RVR.  Admit 11/15 with sepsis  PTA warfarin dose:  5mg  Tues, Thurs, Sat and 2.5mg  on Mon, Wed, Fri, Sun.  Last home dose taken 11/13, appears to have missed dose on 11/14, most likely cause of subtherapeutic INR following admission.  INR subtherapeutic but increasing towards goal.  No bleeding reported in chart notes  CBC stable  Goal of Therapy:  INR 2-3   Plan:   Warfarin 5mg  PO x1 dose at 1800  Daily INR, CBC   Clance Boll, PharmD, BCPS Pager: [phone] 06/19/2012 10:25 AM

## 2012-06-19 NOTE — Progress Notes (Signed)
Patient had 8PVC, denies any pain/distress, in bed sleeping, vitals 137/115, 104 100%-3L and asymptomatic. Notified K. Schorr-NP ordered  Magnesium level check. Will continue to assess patient.

## 2012-06-19 NOTE — Progress Notes (Signed)
[facility] PROGRESS NOTE  [name] ZOX:[id number] DOB: April 22, 1923 DOA: 06/15/2012 PCP: [name], MD  Assessment/Plan: 1. CAP/ Gram positive cocci bacteremia/ UTI:  - Patient was transferred to step down 11/17 for respiratory distress and  for closer monitoring.  - she was started on broad spectrum antibiotics vancomycin and zosyn for multilobe pneumonia. Narrowed antibiotics to rocephin today. We have 2 sets of blood cultures positive for streptococcus pneumoniae.  2 d echo did not show any endocarditis.  Repeated blood cultures pending. Appreciate ID recommendations.   2. Acute respiratory failure: probably from combination of multilobar pneumonia and congestive heart failure.  Her pro bnp is elevated, and she diuresed about 3.5 lit. Started her on IV lasix 20 mg BID  And she has appropriately diuresed. As her creatinine worsened we have changed to po lasix.  I/O last 3 completed shifts: In: 2246 [P.O.:720; I.V.:320; IV Piggyback:1206] Out: 4650 [Urine:4650]       3. Hypokalemia: will be repleted as needed.   4. Metabolic encephalopathy: resolved.   5. Atrial fibrillation with RVR; resolved. Probably from the infection. Continue with metoprolol and coumadin, dosing as per pharmacy.   6. Hyponatremia: improving.   7. Anemia: normocytic. Probably from AOCD.  8. Diabetes Mellitus: CBG (last 3)   Basename 06/19/12 1727 06/19/12 1153 06/19/12 0748  GLUCAP 281* 275* 310*    Increase levemir to 60 units  and SSI.   9. DVT prophylaxis: on coumadin.   Code Status: full code Family Communication: none at  bedside Disposition Plan: pending, in 2 days.   Antibiotics:  Rocephin and zithromax 11/15 to 11/16  Vancomycin and zosyn from 11/17>>11/19  Rocephin 11/20  HPI/Subjective: FEELING MUCH BETTER. Would like to get out of bed and get a bath.   Objective: Filed Vitals:   06/19/12 0503 06/19/12 0745 06/19/12 1437 06/19/12 1532  BP: 140/89   143/50    Pulse: 86   96  Temp: 97.8 F (36.6 C)   97.5 F (36.4 C)  TempSrc: Oral   Oral  Resp: 18   20  Height:      Weight:      SpO2: 97% 94% 95% 99%    Intake/Output Summary (Last 24 hours) at 06/19/12 1913 Last data filed at 06/19/12 1900  Gross per 24 hour  Intake 1591.83 ml  Output   3400 ml  Net -1808.17 ml   Filed Weights   06/15/12 1622 06/18/12 1809  Weight: 111.5 kg (245 lb 13 oz) 112.3 kg (247 lb 9.2 oz)    Exam:   General:  Alert afebrile comfortable  Cardiovascular: s1 s2, slightly tachycardic.   Respiratory: scattered rhonchi and wheezing present.   Abdomen: soft NT ND BS+  Data Reviewed: Basic Metabolic Panel:  Lab 06/19/12 8119 06/18/12 0420 06/17/12 1025 06/16/12 0930 06/15/12 1105  NA 137 134* 134* 133* 131*  K 3.7 3.9 3.0* 3.6 3.9  CL 96 95* 96 97 92*  CO2 31 27 26 21 24   GLUCOSE 341* 255* 197* 243* 90  BUN 37* 27* 21 20 18   CREATININE 1.20* 1.13* 1.04 0.95 0.99  CALCIUM 9.1 8.8 8.5 8.3* 9.3  MG -- -- -- -- --  PHOS -- -- -- -- --   Liver Function Tests:  Lab 06/16/12 0930  AST 19  ALT 11  ALKPHOS 60  BILITOT 0.7  PROT 6.9  ALBUMIN 2.3*   No results found for this basename: LIPASE:5,AMYLASE:5 in the last 168 hours No results found for this  basename: AMMONIA:5 in the last 168 hours CBC:  Lab 06/19/12 0505 06/18/12 0420 06/17/12 1025 06/16/12 0635 06/15/12 1105  WBC 16.7* 14.3* 16.3* 24.5* 22.7*  NEUTROABS -- -- -- -- 19.1*  HGB 11.1* 11.1* 10.9* 11.4* 12.3  HCT 33.8* 33.1* 32.8* 34.2* 35.2*  MCV 93.4 91.7 91.1 92.2 90.7  PLT 439* 365 314 245 309   Cardiac Enzymes: No results found for this basename: CKTOTAL:5,CKMB:5,CKMBINDEX:5,TROPONINI:5 in the last 168 hours BNP (last 3 results)  Basename 06/16/12 1948 10/15/11 1202  PROBNP 3124.0* 1234.0*   CBG:  Lab 06/19/12 1727 06/19/12 1153 06/19/12 0748 06/18/12 2104 06/18/12 1653  GLUCAP 281* 275* 310* 255* 308*    Recent Results (from the past 240 hour(s))  URINE CULTURE      Status: Normal   Collection Time   06/15/12 11:04 AM      Component Value Range Status Comment   Specimen Description URINE, CATHETERIZED   Final    Special Requests NONE   Final    Culture  Setup Time 06/15/2012 21:56   Final    Colony Count >=100,000 COLONIES/ML   Final    Culture     Final    Value: Multiple bacterial morphotypes present, none predominant. Suggest appropriate recollection if clinically indicated.   Report Status 06/16/2012 FINAL   Final   CULTURE, BLOOD (ROUTINE X 2)     Status: Normal   Collection Time   06/15/12  4:30 PM      Component Value Range Status Comment   Specimen Description BLOOD LEFT HAND   Final    Special Requests BOTTLES DRAWN AEROBIC ONLY 10CC   Final    Culture  Setup Time 06/16/2012 01:07   Final    Culture     Final    Value: STREPTOCOCCUS PNEUMONIAE     Note: Gram Stain Report Called to,Read Back By and Verified With: DAWN LARSON 06/16/12 @ 2:40PM BY RUSCA.   Report Status 06/18/2012 FINAL   Final    Organism ID, Bacteria STREPTOCOCCUS PNEUMONIAE   Final   CULTURE, BLOOD (ROUTINE X 2)     Status: Normal   Collection Time   06/15/12  4:50 PM      Component Value Range Status Comment   Specimen Description BLOOD RIGHT ARM   Final    Special Requests BOTTLES DRAWN AEROBIC AND ANAEROBIC 10CC   Final    Culture  Setup Time 06/16/2012 01:07   Final    Culture     Final    Value: STREPTOCOCCUS PNEUMONIAE     Note: SUSCEPTIBILITIES PERFORMED ON PREVIOUS CULTURE WITHIN THE LAST 5 DAYS.     Note: Gram Stain Report Called to,Read Back By and Verified With: TIVET WOODS 06/16/12 @ 9:20PM BY RUSCA.   Report Status 06/18/2012 FINAL   Final   CULTURE, BLOOD (ROUTINE X 2)     Status: Normal (Preliminary result)   Collection Time   06/16/12  7:35 PM      Component Value Range Status Comment   Specimen Description BLOOD RIGHT ARM  5 ML IN [facility] BOTTLE   Final    Special Requests Normal   Final    Culture  Setup Time 06/17/2012 13:15   Final    Culture      Final    Value: STREPTOCOCCUS SPECIES     Note: Gram Stain Report Called to,Read Back By and Verified With: Mort Sawyers RN on 06/18/12 at 05:35 by Christie Nottingham   Report Status PENDING  Incomplete   CULTURE, BLOOD (ROUTINE X 2)     Status: Normal (Preliminary result)   Collection Time   06/16/12  7:40 PM      Component Value Range Status Comment   Specimen Description BLOOD RIGHT ARM  5 ML IN [facility] BOTTLE   Final    Special Requests Normal   Final    Culture  Setup Time 06/17/2012 13:15   Final    Culture     Final    Value: GRAM POSITIVE COCCI IN PAIRS     Note: Gram Stain Report Called to,Read Back By and Verified With: AMANDA THOMPSON @0949  06/19/12 BY KRAWS   Report Status PENDING   Incomplete   MRSA PCR SCREENING     Status: Normal   Collection Time   06/16/12  8:32 PM      Component Value Range Status Comment   MRSA by PCR NEGATIVE  NEGATIVE Final   CULTURE, BLOOD (ROUTINE X 2)     Status: Normal (Preliminary result)   Collection Time   06/17/12  3:16 PM      Component Value Range Status Comment   Specimen Description BLOOD RIGHT HAND   Final    Special Requests BOTTLES DRAWN AEROBIC AND ANAEROBIC 5CC EACH   Final    Culture  Setup Time 06/17/2012 18:39   Final    Culture     Final    Value:        BLOOD CULTURE RECEIVED NO GROWTH TO DATE CULTURE WILL BE HELD FOR 5 DAYS BEFORE ISSUING A FINAL NEGATIVE REPORT   Report Status PENDING   Incomplete   CULTURE, BLOOD (ROUTINE X 2)     Status: Normal (Preliminary result)   Collection Time   06/17/12  3:19 PM      Component Value Range Status Comment   Specimen Description BLOOD RIGHT ARM   Final    Special Requests BOTTLES DRAWN AEROBIC AND ANAEROBIC 3CC EACH   Final    Culture  Setup Time 06/17/2012 18:39   Final    Culture     Final    Value:        BLOOD CULTURE RECEIVED NO GROWTH TO DATE CULTURE WILL BE HELD FOR 5 DAYS BEFORE ISSUING A FINAL NEGATIVE REPORT   Report Status PENDING   Incomplete   CULTURE, EXPECTORATED  SPUTUM-ASSESSMENT     Status: Normal   Collection Time   06/18/12  4:33 AM      Component Value Range Status Comment   Specimen Description SPUTUM   Final    Special Requests Normal   Final    Sputum evaluation     Final    Value: THIS SPECIMEN IS ACCEPTABLE. RESPIRATORY CULTURE REPORT TO FOLLOW.   Report Status 06/18/2012 FINAL   Final   CULTURE, RESPIRATORY     Status: Normal (Preliminary result)   Collection Time   06/18/12  4:33 AM      Component Value Range Status Comment   Specimen Description SPUTUM   Final    Special Requests NONE   Final    Gram Stain     Final    Value: RARE WBC PRESENT, PREDOMINANTLY PMN     RARE SQUAMOUS EPITHELIAL CELLS PRESENT     RARE GRAM POSITIVE COCCI     IN PAIRS RARE GRAM POSITIVE RODS   Culture Culture reincubated for better growth   Final    Report Status PENDING   Incomplete  Studies: Dg Chest Port 1 View  06/17/2012  *RADIOLOGY REPORT*  Clinical Data: PICC line adjustment  PORTABLE CHEST - 1 VIEW  Comparison: Portable exam 2000 hours compared to 1830 hours  Findings: Tip of right arm PICC line projects over SVC above cavoatrial junction. Upper normal heart size. Atherosclerotic calcification aorta. Persistent right middle lobe and left upper lobe infiltrates. Right glenohumeral degenerative changes noted. No pleural effusion or pneumothorax.  IMPRESSION: Tip of right arm PICC line projects over SVC above cavoatrial junction. Persistent pulmonary infiltrates.   Original Report Authenticated By: Ulyses Southward, M.D.     Scheduled Meds:    . acetaminophen  500 mg Oral BID  . cefTRIAXone (ROCEPHIN)  IV  2 g Intravenous Q24H  . furosemide  20 mg Oral BID  . insulin aspart  0-20 Units Subcutaneous TID WC  . insulin aspart  0-5 Units Subcutaneous QHS  . insulin aspart  6 Units Subcutaneous TID WC  . insulin detemir  60 Units Subcutaneous Q breakfast  . ipratropium  0.5 mg Nebulization Q6H  . levalbuterol  0.63 mg Nebulization Q6H  .  levothyroxine  150 mcg Oral Daily  . methylPREDNISolone (SOLU-MEDROL) injection  60 mg Intravenous Q12H  . metoprolol  5 mg Intravenous Once  . metoprolol  25 mg Oral BID  . simvastatin  40 mg Oral QHS  . sodium chloride  10-40 mL Intracatheter Q12H  . sodium chloride  3 mL Intravenous Q12H  . [COMPLETED] warfarin  5 mg Oral ONCE-1800  . [COMPLETED] warfarin  5 mg Oral ONCE-1800  . Warfarin - Pharmacist Dosing Inpatient   Does not apply q1800  . [DISCONTINUED] furosemide  20 mg Intravenous BID  . [DISCONTINUED] piperacillin-tazobactam (ZOSYN)  IV  3.375 g Intravenous Q8H  . [DISCONTINUED] vancomycin  1,500 mg Intravenous Q24H   Continuous Infusions:    . sodium chloride 10 mL/hr at 06/16/12 1800    Principal Problem:  *Sepsis(995.91) Active Problems:  HYPERTENSION  UTI (lower urinary tract infection)  Metabolic encephalopathy  Atrial fibrillation with RVR  Hyponatremia  CAP (community acquired pneumonia)  Anemia        [name]  [facility] Pager [phone]. If 8PM-8AM, please contact night-coverage at [email or web address], password Ku [facility] 06/19/2012, 7:13 PM  LOS: 4 days

## 2012-06-19 NOTE — Progress Notes (Addendum)
VASCULAR LAB PRELIMINARY  ARTERIAL  ABI completed:    RIGHT    LEFT    PRESSURE WAVEFORM  PRESSURE WAVEFORM  BRACHIAL PICC Line Triphasic BRACHIAL 140 Triphasic  DP Noncompressible Biphasic DP 192 Biphasic  AT   AT    PT Noncompressible Biphasic PT  Unable to insonate  PER   PER    GREAT TOE  NA GREAT TOE  NA    RIGHT LEFT  ABI - 1.37   The right dorsalis pedis and posterior tibial arteries are noncompressible, therefore unable to calculate ABI. Unable to insonate the left posterior tibial artery, the left ABI=1.37, which is likely falsely elevated due to calcified vessels.  06/19/2012 10:03 AM Gertie Fey, RDMS, RDCS

## 2012-06-19 NOTE — Progress Notes (Signed)
Talked to patient about DCP; patient plans to return home at discharge with home health care services. Patient requested [facility] because she has had them in the past and would like to have them again. Olene Craven RN with [facility] called for arrangements. Attending MD please order HHRN/PT/OT, nurses aid and a SW; Patient also gave CM permission to talk to her son Leta Speller [phone]; all questions answered; B Ave Filter RN,BSN,MHA

## 2012-06-19 NOTE — Progress Notes (Signed)
Blood culture results positive. Called to Dr. Blake Divine.

## 2012-06-19 NOTE — Progress Notes (Signed)
Physical Therapy Treatment Patient Details Name: [name] MRN: [id number] DOB: 1923/06/17 Today's Date: 06/19/2012 Time: 1308-6578 PT Time Calculation (min): 30 min  PT Assessment / Plan / Recommendation Comments on Treatment Session  Pt self directs session stating "I can do it but it has to be my way". Pt did well and declines rec for SNF.  Son present and agrees after observer his mom amb in hallway.  Pt and family would like pt to D/C to home with family support.    Follow Up Recommendations   (pt/son agree they would like D/C to home)     Does the patient have the potential to tolerate intense rehabilitation     Barriers to Discharge        Equipment Recommendations  None recommended by PT;None recommended by OT (already has proper equipment)    Recommendations for Other Services    Frequency Min 3X/week   Plan      Precautions / Restrictions     Pertinent Vitals/Pain C/o "some' back pain    Mobility  Bed Mobility Bed Mobility: Supine to Sit;Sitting - Scoot to Edge of Bed;Rolling Left Rolling Left: 5: Supervision Supine to Sit: 5: Supervision Sitting - Scoot to Edge of Bed: 5: Supervision Details for Bed Mobility Assistance: Pt stated I can do it but it has to be my way". Bed in flat position and increased time with use of left rail, pt performed at Supervision level.  Transfers Transfers: Sit to Stand;Stand to Sit Sit to Stand: 5: Supervision;From bed Stand to Sit: 5: Supervision;To chair/3-in-1 Details for Transfer Assistance: Pt stated "just give me time and I will do it myself". Pt was able to stand at Supervision level with increased time.  Ambulation/Gait Ambulation/Gait Assistance: 1: +2 Total assist (+ 2 assist for safety and equipment(chair/IV)) Ambulation/Gait: Patient Percentage: 90% Ambulation Distance (Feet): 64 Feet Assistive device: Rolling walker (wide/youth) Ambulation/Gait Assistance Details: Pt stated "Let me show you how I can walk".  Using a bariatric/youth RW pt was able to amb in hallway on RA demon 2/4 DOE knowledgable of when she needed to stop and rest/sit. Pt states she is unable to stand upright and demon flexed forward during gait.  I have a bad back".  Son present and observed this activity. Gait Pattern: Step-through pattern;Shuffle;Wide base of support;Trunk flexed Gait velocity: decreased General Gait Details: leans over walker. Self directs activity      PT Goals                                                                progressing    Visit Information  Last PT Received On: 06/19/12 Assistance Needed: +2 (for safety and equipment)    Subjective Data  Subjective: I'm not going to a nursing home Patient Stated Goal: home with spouse   Cognition    good   Balance   fair +  End of Session PT - End of Session Equipment Utilized During Treatment: Gait belt Activity Tolerance: Patient tolerated treatment well Patient left: in chair;with call bell/phone within reach;with family/visitor present   Felecia Shelling  PTA [facility]  Acute  Rehab Pager     [phone]

## 2012-06-19 NOTE — Progress Notes (Signed)
INFECTIOUS DISEASE PROGRESS NOTE  ID: [name] is a 78 y.o. female with   Principal Problem:  *Sepsis(995.91) Active Problems:  HYPERTENSION  UTI (lower urinary tract infection)  Metabolic encephalopathy  Atrial fibrillation with RVR  Hyponatremia  CAP (community acquired pneumonia)  Anemia  Subjective: Continued cough, c/o poor hearing  Abtx:  Anti-infectives     Start     Dose/Rate Route Frequency Ordered Stop   06/18/12 1100   vancomycin (VANCOCIN) 1,500 mg in sodium chloride 0.9 % 500 mL IVPB        1,500 mg 250 mL/hr over 120 Minutes Intravenous Every 24 hours 06/17/12 1009     06/17/12 1200  piperacillin-tazobactam (ZOSYN) IVPB 3.375 g       3.375 g 12.5 mL/hr over 240 Minutes Intravenous Every 8 hours 06/17/12 1009     06/17/12 1100   vancomycin (VANCOCIN) 2,000 mg in sodium chloride 0.9 % 500 mL IVPB        2,000 mg 250 mL/hr over 120 Minutes Intravenous  Once 06/17/12 1009 06/17/12 1511   06/16/12 1845   cefTRIAXone (ROCEPHIN) 1 g in dextrose 5 % 50 mL IVPB  Status:  Discontinued        1 g 100 mL/hr over 30 Minutes Intravenous Daily-1800 06/16/12 1836 06/17/12 0937   06/16/12 1300   azithromycin (ZITHROMAX) 500 mg in dextrose 5 % 250 mL IVPB  Status:  Discontinued        500 mg 250 mL/hr over 60 Minutes Intravenous Every 24 hours 06/16/12 1203 06/17/12 0937   06/15/12 1130   cefTRIAXone (ROCEPHIN) 1 g in dextrose 5 % 50 mL IVPB        1 g 100 mL/hr over 30 Minutes Intravenous  Once 06/15/12 1126 06/15/12 1356          Medications:  Scheduled:   . acetaminophen  500 mg Oral BID  . furosemide  20 mg Oral BID  . insulin aspart  0-20 Units Subcutaneous TID WC  . insulin aspart  0-5 Units Subcutaneous QHS  . insulin aspart  6 Units Subcutaneous TID WC  . insulin detemir  60 Units Subcutaneous Q breakfast  . ipratropium  0.5 mg Nebulization Q6H  . levalbuterol  0.63 mg Nebulization Q6H  . levothyroxine  150 mcg Oral Daily  .  methylPREDNISolone (SOLU-MEDROL) injection  60 mg Intravenous Q12H  . metoprolol  5 mg Intravenous Once  . metoprolol  25 mg Oral BID  . piperacillin-tazobactam (ZOSYN)  IV  3.375 g Intravenous Q8H  . simvastatin  40 mg Oral QHS  . sodium chloride  10-40 mL Intracatheter Q12H  . sodium chloride  3 mL Intravenous Q12H  . vancomycin  1,500 mg Intravenous Q24H  . [COMPLETED] warfarin  5 mg Oral ONCE-1800  . warfarin  5 mg Oral ONCE-1800  . Warfarin - Pharmacist Dosing Inpatient   Does not apply q1800  . [DISCONTINUED] furosemide  20 mg Intravenous BID    Objective: Vital signs in last 24 hours: Temp:  [97.5 F (36.4 C)-98.5 F (36.9 C)] 97.5 F (36.4 C) (11/19 1532) Pulse Rate:  [86-112] 96  (11/19 1532) Resp:  [18-20] 20  (11/19 1532) BP: (137-172)/(50-110) 143/50 mmHg (11/19 1532) SpO2:  [94 %-99 %] 99 % (11/19 1532) Weight:  [112.3 kg (247 lb 9.2 oz)] 112.3 kg (247 lb 9.2 oz) (11/18 1809)   General appearance: alert, cooperative and no distress Resp: clear to auscultation bilaterally Cardio: regular rate and rhythm GI:  normal findings: bowel sounds normal and soft, non-tender Extremities: L foot wound clean, L foot slightly cooler then R.   Lab Results  [facility] 06/19/12 0505 06/18/12 0420  WBC 16.7* 14.3*  HGB 11.1* 11.1*  HCT 33.8* 33.1*  NA 137 134*  K 3.7 3.9  CL 96 95*  CO2 31 27  BUN 37* 27*  CREATININE 1.20* 1.13*  GLU -- --   Liver Panel No results found for this basename: PROT:2,ALBUMIN:2,AST:2,ALT:2,ALKPHOS:2,BILITOT:2,BILIDIR:2,IBILI:2 in the last 72 hours Sedimentation Rate No results found for this basename: ESRSEDRATE in the last 72 hours C-Reactive Protein No results found for this basename: CRP:2 in the last 72 hours  Microbiology: Recent Results (from the past 240 hour(s))  URINE CULTURE     Status: Normal   Collection Time   06/15/12 11:04 AM      Component Value Range Status Comment   Specimen Description URINE, CATHETERIZED   Final     Special Requests NONE   Final    Culture  Setup Time 06/15/2012 21:56   Final    Colony Count >=100,000 COLONIES/ML   Final    Culture     Final    Value: Multiple bacterial morphotypes present, none predominant. Suggest appropriate recollection if clinically indicated.   Report Status 06/16/2012 FINAL   Final   CULTURE, BLOOD (ROUTINE X 2)     Status: Normal   Collection Time   06/15/12  4:30 PM      Component Value Range Status Comment   Specimen Description BLOOD LEFT HAND   Final    Special Requests BOTTLES DRAWN AEROBIC ONLY 10CC   Final    Culture  Setup Time 06/16/2012 01:07   Final    Culture     Final    Value: STREPTOCOCCUS PNEUMONIAE     Note: Gram Stain Report Called to,Read Back By and Verified With: DAWN LARSON 06/16/12 @ 2:40PM BY RUSCA.   Report Status 06/18/2012 FINAL   Final    Organism ID, Bacteria STREPTOCOCCUS PNEUMONIAE   Final   CULTURE, BLOOD (ROUTINE X 2)     Status: Normal   Collection Time   06/15/12  4:50 PM      Component Value Range Status Comment   Specimen Description BLOOD RIGHT ARM   Final    Special Requests BOTTLES DRAWN AEROBIC AND ANAEROBIC 10CC   Final    Culture  Setup Time 06/16/2012 01:07   Final    Culture     Final    Value: STREPTOCOCCUS PNEUMONIAE     Note: SUSCEPTIBILITIES PERFORMED ON PREVIOUS CULTURE WITHIN THE LAST 5 DAYS.     Note: Gram Stain Report Called to,Read Back By and Verified With: TIVET WOODS 06/16/12 @ 9:20PM BY RUSCA.   Report Status 06/18/2012 FINAL   Final   CULTURE, BLOOD (ROUTINE X 2)     Status: Normal (Preliminary result)   Collection Time   06/16/12  7:35 PM      Component Value Range Status Comment   Specimen Description BLOOD RIGHT ARM  5 ML IN [facility] BOTTLE   Final    Special Requests Normal   Final    Culture  Setup Time 06/17/2012 13:15   Final    Culture     Final    Value: STREPTOCOCCUS SPECIES     Note: Gram Stain Report Called to,Read Back By and Verified With: Mort Sawyers RN on 06/18/12 at 05:35  by Christie Nottingham   Report Status PENDING  Incomplete   CULTURE, BLOOD (ROUTINE X 2)     Status: Normal (Preliminary result)   Collection Time   06/16/12  7:40 PM      Component Value Range Status Comment   Specimen Description BLOOD RIGHT ARM  5 ML IN [facility] BOTTLE   Final    Special Requests Normal   Final    Culture  Setup Time 06/17/2012 13:15   Final    Culture     Final    Value: GRAM POSITIVE COCCI IN PAIRS     Note: Gram Stain Report Called to,Read Back By and Verified With: AMANDA THOMPSON @0949  06/19/12 BY KRAWS   Report Status PENDING   Incomplete   MRSA PCR SCREENING     Status: Normal   Collection Time   06/16/12  8:32 PM      Component Value Range Status Comment   MRSA by PCR NEGATIVE  NEGATIVE Final   CULTURE, BLOOD (ROUTINE X 2)     Status: Normal (Preliminary result)   Collection Time   06/17/12  3:16 PM      Component Value Range Status Comment   Specimen Description BLOOD RIGHT HAND   Final    Special Requests BOTTLES DRAWN AEROBIC AND ANAEROBIC 5CC EACH   Final    Culture  Setup Time 06/17/2012 18:39   Final    Culture     Final    Value:        BLOOD CULTURE RECEIVED NO GROWTH TO DATE CULTURE WILL BE HELD FOR 5 DAYS BEFORE ISSUING A FINAL NEGATIVE REPORT   Report Status PENDING   Incomplete   CULTURE, BLOOD (ROUTINE X 2)     Status: Normal (Preliminary result)   Collection Time   06/17/12  3:19 PM      Component Value Range Status Comment   Specimen Description BLOOD RIGHT ARM   Final    Special Requests BOTTLES DRAWN AEROBIC AND ANAEROBIC 3CC EACH   Final    Culture  Setup Time 06/17/2012 18:39   Final    Culture     Final    Value:        BLOOD CULTURE RECEIVED NO GROWTH TO DATE CULTURE WILL BE HELD FOR 5 DAYS BEFORE ISSUING A FINAL NEGATIVE REPORT   Report Status PENDING   Incomplete   CULTURE, EXPECTORATED SPUTUM-ASSESSMENT     Status: Normal   Collection Time   06/18/12  4:33 AM      Component Value Range Status Comment   Specimen Description SPUTUM    Final    Special Requests Normal   Final    Sputum evaluation     Final    Value: THIS SPECIMEN IS ACCEPTABLE. RESPIRATORY CULTURE REPORT TO FOLLOW.   Report Status 06/18/2012 FINAL   Final   CULTURE, RESPIRATORY     Status: Normal (Preliminary result)   Collection Time   06/18/12  4:33 AM      Component Value Range Status Comment   Specimen Description SPUTUM   Final    Special Requests NONE   Final    Gram Stain     Final    Value: RARE WBC PRESENT, PREDOMINANTLY PMN     RARE SQUAMOUS EPITHELIAL CELLS PRESENT     RARE GRAM POSITIVE COCCI     IN PAIRS RARE GRAM POSITIVE RODS   Culture Culture reincubated for better growth   Final    Report Status PENDING   Incomplete  Studies/Results: Dg Chest Port 1 View  06/17/2012  *RADIOLOGY REPORT*  Clinical Data: PICC line adjustment  PORTABLE CHEST - 1 VIEW  Comparison: Portable exam 2000 hours compared to 1830 hours  Findings: Tip of right arm PICC line projects over SVC above cavoatrial junction. Upper normal heart size. Atherosclerotic calcification aorta. Persistent right middle lobe and left upper lobe infiltrates. Right glenohumeral degenerative changes noted. No pleural effusion or pneumothorax.  IMPRESSION: Tip of right arm PICC line projects over SVC above cavoatrial junction. Persistent pulmonary infiltrates.   Original Report Authenticated By: Ulyses Southward, M.D.    Dg Chest Port 1 View  06/17/2012  *RADIOLOGY REPORT*  Clinical Data: Line placement.  PICC placement.  PORTABLE CHEST - 1 VIEW  Comparison: 06/16/2012.  Findings: New right upper extremity PICC is present with the tip in the right brachiocephalic vein.  Low volume chest.  Lungs appear unchanged allowing for lower volumes.  IMPRESSION: New right upper extremity PICC with the tip in the right brachiocephalic vein.  Line should be advanced 8 cm for positioning at the cavoatrial junction.   Original Report Authenticated By: Andreas Newport, M.D.       Assessment/Plan:  Bacteremia- Pneumococcal (BCx 4/4)  BCx 11-17 (-) so far UTI (?Urosepsis)- Cx polymicrobial.  Pneumonia  Diabetic Foot  Day 5 anbx (vanco/zosyn)   Appreciate WOC eval.  Her foot looks much better (? Bruise from fall). It is slightly cooler than R. Will need close f/u.  ABI- not reliable due to calcified vessels.  Will change her to ceftriaxone alone. Needs 4 weeks total rx due to persistently positive BCx.  Needs repeat BCx at end of therapy , as well as repeat CXR Available if questions.    [name] Infectious Diseases [phone] 06/19/2012, 3:59 PM   LOS: 4 days

## 2012-06-19 NOTE — Telephone Encounter (Signed)
Notified Josephene with Hospice that Dr. Milinda Antis wants to wait to get pt's d/c summery before giving a diagnosis for hospice, Josephene was ok with this

## 2012-06-20 LAB — PRO B NATRIURETIC PEPTIDE: Pro B Natriuretic peptide (BNP): 3396 pg/mL — ABNORMAL HIGH (ref 0–450)

## 2012-06-20 LAB — PROTIME-INR
INR: 2.33 — ABNORMAL HIGH (ref 0.00–1.49)
Prothrombin Time: 24.5 seconds — ABNORMAL HIGH (ref 11.6–15.2)

## 2012-06-20 LAB — GLUCOSE, CAPILLARY
Glucose-Capillary: 355 mg/dL — ABNORMAL HIGH (ref 70–99)
Glucose-Capillary: 335 mg/dL — ABNORMAL HIGH (ref 70–99)

## 2012-06-20 LAB — BASIC METABOLIC PANEL
Chloride: 95 mEq/L — ABNORMAL LOW (ref 96–112)
GFR calc Af Amer: 46 mL/min — ABNORMAL LOW (ref >90)
GFR calc non Af Amer: 39 mL/min — ABNORMAL LOW (ref >90)
Potassium: 3.8 mEq/L (ref 3.5–5.1)
Sodium: 135 mEq/L (ref 135–145)

## 2012-06-20 LAB — CBC
HCT: 36.2 % (ref 36.0–46.0)
MCHC: 33.1 g/dL (ref 30.0–36.0)
RDW: 13.3 % (ref 11.5–15.5)
WBC: 14.4 10*3/uL — ABNORMAL HIGH (ref 4.0–10.5)

## 2012-06-20 LAB — MAGNESIUM: Magnesium: 1.2 mg/dL — ABNORMAL LOW (ref 1.5–2.5)

## 2012-06-20 MED ORDER — MAGNESIUM SULFATE 40 MG/ML IJ SOLN
4.0000 g | Freq: Once | INTRAMUSCULAR | Status: AC
  Administered 2012-06-20: 4 g via INTRAVENOUS
  Filled 2012-06-20: qty 100

## 2012-06-20 MED ORDER — INSULIN DETEMIR 100 UNIT/ML ~~LOC~~ SOLN
65.0000 [IU] | Freq: Every day | SUBCUTANEOUS | Status: DC
  Administered 2012-06-21: 65 [IU] via SUBCUTANEOUS
  Filled 2012-06-20: qty 10

## 2012-06-20 MED ORDER — WARFARIN SODIUM 2.5 MG PO TABS
2.5000 mg | ORAL_TABLET | Freq: Once | ORAL | Status: AC
  Administered 2012-06-20: 2.5 mg via ORAL
  Filled 2012-06-20: qty 1

## 2012-06-20 NOTE — Progress Notes (Signed)
Physical Therapy Treatment Patient Details Name: [name] MRN: [id number] DOB: [birth year] Today's Date: 06/20/2012 Time: 4098-1191 PT Time Calculation (min): 43 min  PT Assessment / Plan / Recommendation Comments on Treatment Session  Pt. self limits, talks through all activities. Rec. HHPT     Follow Up Recommendations  Home health PT     Does the patient have the potential to tolerate intense rehabilitation     Barriers to Discharge        Equipment Recommendations  None recommended by PT    Recommendations for Other Services    Frequency Min 3X/week   Plan Discharge plan remains appropriate;Frequency remains appropriate    Precautions / Restrictions Precautions Precautions: Fall Precaution Comments: monitor HR Restrictions Weight Bearing Restrictions: No   Pertinent Vitals/Pain Back, had meds.    Mobility  Bed Mobility Supine to Sit: 5: Supervision Sitting - Scoot to Edge of Bed: 5: Supervision Details for Bed Mobility Assistance: increased time  Transfers Sit to Stand: 4: Min guard;With upper extremity assist;From bed;From chair/3-in-1 Stand to Sit: 4: Min guard;With upper extremity assist;To chair/3-in-1 Details for Transfer Assistance: verbal cues for hand placement and overall safety but pt tends to still pull up on walker at times.  Ambulation/Gait Ambulation/Gait Assistance: 1: +2 Total assist Ambulation/Gait: Patient Percentage: 90% Ambulation Distance (Feet): 10 Feet (x10) Assistive device: Rolling walker Ambulation/Gait Assistance Details: VC for keeping on task. extra time for activity Gait Pattern: Step-to pattern;Trunk flexed    Exercises     PT Diagnosis:    PT Problem List:   PT Treatment Interventions:     PT Goals Acute Rehab PT Goals Pt will go Supine/Side to Sit: with modified independence PT Goal: Supine/Side to Sit - Progress: Updated due to goal met Pt will go Sit to Stand: with modified independence PT Goal: Sit to Stand -  Progress: Progressing toward goal Pt will go Stand to Sit: with modified independence PT Goal: Stand to Sit - Progress: Progressing toward goal Pt will Ambulate: 51 - 150 feet;with modified independence;with rolling walker PT Goal: Ambulate - Progress: Progressing toward goal  Visit Information  Last PT Received On: 06/20/12 Assistance Needed: +2    Subjective Data  Subjective: I am not going to a nursing home.   Cognition  Overall Cognitive Status: Impaired Area of Impairment: Attention;Safety/judgement Arousal/Alertness: Awake/alert Behavior During Session: WFL for tasks performed    Balance     End of Session PT - End of Session Activity Tolerance: Patient tolerated treatment well Patient left: in chair;with call bell/phone within reach;with chair alarm set Nurse Communication: Mobility status   GP     Rada Hay 06/20/2012, 4:20 PM

## 2012-06-20 NOTE — Progress Notes (Addendum)
Occupational Therapy Treatment Patient Details Name: [name] MRN: [id number] DOB: 21-Jul-1923 Today's Date: 06/20/2012 Time: 4098-1191 OT Time Calculation (min): 43 min  OT Assessment / Plan / Recommendation Comments on Treatment Session Pt transferred to [facility] across the room and to recliner with +2 for safety. pt 90%. She will benefit from more toilet aid education next visit.     Follow Up Recommendations  Would benefit from SNF (however pt stating she cant afford SNF cost. If she is d/c home then recommend HHOT and aid and 24/7 assist. Per chart, pt/son planning home.)     Barriers to Discharge       Equipment Recommendations  None recommended by PT;None recommended by OT    Recommendations for Other Services    Frequency Min 2X/week   Plan Discharge plan remains appropriate    Precautions / Restrictions Precautions Precautions: Fall Precaution Comments: monitor HR Restrictions Weight Bearing Restrictions: No        ADL  Toilet Transfer: +2 Total assistance;Other (comment) (mod verbal cues for safety. see below. BSC across room) Toilet Transfer: Patient Percentage: 90% Toilet Transfer Equipment: Bedside commode Toileting - Clothing Manipulation and Hygiene: Performed;+2 Total assistance;Other (comment) (posterior hygiene after bowel movement) Toileting - Clothing Manipulation and Hygiene: Patient Percentage: 0% Where Assessed - Toileting Clothing Manipulation and Hygiene: Sit to stand from 3-in-1 or toilet Equipment Used: Rolling walker;Other (comment) (wide RW) Transfers/Ambulation Related to ADLs: Pt doesnt like to be touched during activity. But she gets easily distracted and needs moderate cues for safety and redirection. She tends to pull up on RW despite cues to push up from bed or armrests of 3in1. pt states husband has been assisting with hygiene at toilet and educated pt on toilet aid option. Pt open to more education on toilet aid. +2 for safety with  transfer from E[facility] to [facility] across room and then to recliner.     OT Diagnosis:    OT Problem List:   OT Treatment Interventions:     OT Goals ADL Goals ADL Goal: Toilet Transfer - Progress: Progressing toward goals ADL Goal: Toileting - Hygiene - Progress: Not progressing (needs toilet aid to be more independent)  Visit Information  Last OT Received On: 06/20/12 PT/OT Co-Evaluation/Treatment: Yes    Subjective Data  Subjective: I dont like that other walker Patient Stated Goal: with encouragement, agreeable to get up with therapy   Prior Functioning       Cognition  Overall Cognitive Status: Impaired Area of Impairment: Attention Arousal/Alertness: Awake/alert Behavior During Session: [facility] for tasks performed    Mobility  Shoulder Instructions Bed Mobility Supine to Sit: 5: Supervision;With rails Sitting - Scoot to Edge of Bed: 5: Supervision Details for Bed Mobility Assistance: increased time ad use of rail.  Transfers Transfers: Sit to Stand;Stand to Sit Sit to Stand: 4: Min guard;With upper extremity assist;From bed;From chair/3-in-1 Stand to Sit: 4: Min guard;With upper extremity assist;To chair/3-in-1 Details for Transfer Assistance: verbal cues for hand placement and overall safety but pt tends to still pull up on walker at times.        Exercises      Balance     End of Session OT - End of Session Activity Tolerance: Patient tolerated treatment well Patient left: in chair;with call bell/phone within reach;with chair alarm set  GO     Lennox Laity [phone] 06/20/2012, 12:42 PM

## 2012-06-20 NOTE — Progress Notes (Signed)
ANTICOAGULATION CONSULT NOTE - Follow Up  Pharmacy Consult for Warfarin Indication: Afib  Allergies  Allergen Reactions  . Insulin Glargine     Itching   . Latex     Rash   . Metformin And Related     Severe diarrhea   . Metoclopramide Hcl     REACTION: unspecified  . Nsaids     REACTION: unspecified  . Promethazine Hcl     REACTION: unspecified    Patient Measurements: Height: 5\' 2"  (157.5 cm) Weight: 247 lb 12.8 oz (112.4 kg) IBW/kg (Calculated) : 50.1   Vital Signs: Temp: 98.7 F (37.1 C) (11/20 0524) Temp src: Oral (11/20 0524) BP: 157/86 mmHg (11/20 0524) Pulse Rate: 86  (11/20 0524)  Labs:  Basename 06/20/12 0325 06/19/12 0505 06/18/12 0420  HGB 12.0 11.1* --  HCT 36.2 33.8* 33.1*  PLT 481* 439* 365  APTT -- -- --  LABPROT 24.5* 20.7* 19.2*  INR 2.33* 1.85* 1.68*  HEPARINUNFRC -- -- --  CREATININE 1.20* 1.20* 1.13*  CKTOTAL -- -- --  CKMB -- -- --  TROPONINI -- -- --    Estimated Creatinine Clearance: 39.1 ml/min (by C-G formula based on Cr of 1.2).   Assessment:  89 YOF on wafarin PTA for afib with RVR.  Admit 11/15 with sepsis.  PTA warfarin dose:  5mg  Tues, Thurs, Sat and 2.5mg  on Mon, Wed, Fri, Sun.  Last home dose taken 11/13, appears to have missed dose on 11/14, most likely cause of subtherapeutic INR following admission.  INR therapeutic this AM.  No bleeding reported in chart notes.  CBC stable.  Goal of Therapy:  INR 2-3   Plan:   Warfarin 2.5 mg PO x1 dose at 1800  Daily INR.   Clance Boll, PharmD, BCPS Pager: [phone] 06/20/2012 10:23 AM

## 2012-06-20 NOTE — Progress Notes (Signed)
Patient's blood pressure has improved with rest to 111/72. Will continue to monitor. Erskin Burnet RN

## 2012-06-20 NOTE — Evaluation (Signed)
Clinical/Bedside Swallow Evaluation Patient Details  Name: [name] MRN: [id number] Date of Birth: [birth year]  Today's Date: 06/20/2012 Time: 0905-0950 SLP Time Calculation (min): 45 min  Past Medical History:  Past Medical History  Diagnosis Date  . Atrial fibrillation   . Diabetes mellitus     type II  . Anemia     Fe deficiency  . GERD (gastroesophageal reflux disease)   . Hyperlipidemia   . Hypertension   . Hypothyroid   . Obesity   . Vaginal dysplasia 1980  . Rosacea   . Chest pain 11/2002    cardiac cath neg   . Back pain   . IBS (irritable bowel syndrome)     with diarrhea  . Asthma     as a child  . Adenomatous colon polyp 12/2007  . Hemorrhoids   . Vertigo   . Hiatal hernia 12/2007    EGD   Past Surgical History:  Past Surgical History  Procedure Date  . Abdominal hysterectomy 1963  . Cholecystectomy 2006  . Knee surgery     Rt TKR 1993 & Lt TKR 2001  . Cervical cone biopsy 1963    D&C  . Appendectomy 1941  . Cystocele repair 1978    /rectocele  . Orif ankle fracture 01/2010    fall/followed by rehab stay  . Cataract extraction 2006    bilateral   HPI:  78 yo female adm to [facility] with mental status change, Pt found to have UTI, metabolic encephalopathy, acute respiratory failure, CAP.  PMH + for  Afib, + GERD *per pt report .  Intake documented as 50-75%.  CXR indicated persistent pulmonary infiltrates 06/17/12.  Pt reports she has thyroid problems and will occasionallly "choke" with intake and even just when breathing.  This problem has occured "for years" per pt.  She takes omeprazole once a day and has reflux x50 years per interview.      Assessment / Plan / Recommendation Clinical Impression  Limited po observed due to pt stating "I don't eat, I drink."  Baseline productive cough noted, swallow was timely with clear voice throughout and no clinical s/s of aspiration or severe dysphagia.  CN exam unremarkable. As pt reports problems with  thyroid, slp advised her to masticate foods thoroughly to compensate for her issues.  Pt takes pills with applesauce to mitigate her dysphagia per interview..    Pt does report problems with reflux and nasal drainage, which SLP suspects may be primary source of her "choking".  Advised pt to aspiration precautions with intake given her report of dyspnea with intake.  Pt verbalized understanding to information provided.  No further SLP indicated.  Thanks for the referral.     Aspiration Risk  Mild    Diet Recommendation Regular;Thin liquid   Liquid Administration via: Straw;Cup Medication Administration:  (defer to pt) Supervision: Patient able to self feed Compensations: Slow rate;Small sips/bites Postural Changes and/or Swallow Maneuvers: Seated upright 90 degrees;Upright 30-60 min after meal    Other  Recommendations Oral Care Recommendations: Oral care BID   Follow Up Recommendations  None    Frequency and Duration        Pertinent Vitals/Pain Afebrile, decreased, productive cough    SLP Swallow Goals   N/a eval and d'c  Swallow Study Prior Functional Status   Fed self at home.     General Date of Onset: 06/20/12 HPI: 78 yo female adm to [facility] with mental status change, Pt found to have UTI,  metabolic encephalopathy, acute respiratory failure, CAP.  PMH + for  Afib, + GERD *per pt report .  Intake documented as 50-75%.  CXR indicated persistent pulmonary infiltrates 06/17/12.  Pt reports she has thyroid problems and will occasionallly "choke" with intake and even just when breathing.  This problem has occured "for years" per pt.  She takes omeprazole once a day and has reflux x50 years per interview.    Type of Study: Bedside swallow evaluation Previous Swallow Assessment: none Diet Prior to this Study: Regular;Thin liquids Temperature Spikes Noted: No Respiratory Status: Supplemental O2 delivered via (comment) (3 liters) History of Recent Intubation: No Behavior/Cognition:  Alert;Cooperative;Decreased sustained attention (verbose, distracted by her own comfort) Oral Cavity - Dentition: Adequate natural dentition Self-Feeding Abilities: Able to feed self Patient Positioning: Upright in bed Baseline Vocal Quality: Clear Volitional Cough: Strong (productive:  pt reports problems with phlegm) Volitional Swallow: Able to elicit    Oral/Motor/Sensory Function Overall Oral Motor/Sensory Function: Appears within functional limits for tasks assessed   Ice Chips Ice chips: Not tested   Thin Liquid Thin Liquid: Within functional limits Presentation: Self Fed;Straw Other Comments: pt needed verbal cues to sit upright to consume gingerale    Nectar Thick Nectar Thick Liquid: Not tested   Honey Thick Honey Thick Liquid: Not tested   Puree Puree: Within functional limits Presentation: Self Fed;Spoon   Solid   GO    Other Comments: pt declined to consume any solids       Donavan Burnet, MS [facility] SLP [phone]

## 2012-06-20 NOTE — Progress Notes (Signed)
Talked to patient with son Leta Speller in the room; patient/son wants patient to have [facility] at discharge. Patient was referred to [facility] at home from the PCP office and would like to have them at discharge. Attending MD, if in agreement, please enter order for home hospice. B Kelse Ploch RN,BSN,MHA.

## 2012-06-20 NOTE — Progress Notes (Signed)
[facility] PROGRESS NOTE  Keyly Baldonado Brousseau ZOX:[id number] DOB: 04/21/1923 DOA: 06/15/2012 PCP: [name], MD  Assessment/Plan: Principal Problem:  *Sepsis(995.91) Active Problems:  HYPERTENSION  UTI (lower urinary tract infection)  Metabolic encephalopathy  Atrial fibrillation with RVR  Hyponatremia  CAP (community acquired pneumonia)  Anemia    1. CAP/Gram positive cocci bacteremia/ UTI:  Patient presented with altered mental status, described as confusion, preceded by one weak of cough. Urinalysis demonstrated a positive sediment, consistent with UTI. CXR showed patchy parenchymal disease that could represent airspace disease and infection, while follow up chest CT scan revealed multilobar pneumonia in the apex of the left upper lobe and posterior aspect of the right middle lobe (lateral segment) as well as trace bilateral pleural effusions and mild cylindrical bronchiectasis in the lower lobes of the lungs bilaterally. Patient was initially managed with iv Rocephin/Azithromycin, blood culture grew gram positive bacteremia, and patient was transitioned to iv Vancomycin/Zosyn, as of 06/17/12. The culprit organism was identified as streptococcus pneumoniae in 2:2 bottles. Dr [name] provided ID consultation, and as of 06/19/12, patient's antibiotic therapy was rationalized to monotherapy with Rocephin. Clinically, she has significantly improved, and wcc is trending down. 2 D echocardiogram showed no evidence of endocarditis. Repeated blood cultures pending.  2. Acute respiratory failure: Patient was short of breath at presentation, consistent with acute respiratory failure, secondary to multi-focal pneumonia and possibly mild congestive heart failure. Her Probnp was elevated at 3124.0, she was commenced on IV lasix 20 mg BID and diuresed about 3.5 liters. She has since been transitioned to oral diuretics, without deleterious effect.  3. Hypokalemia: Repleted as indicated.  4.  Toxic-Metabolic encephalopathy: This was due to infective problems, ad appears to have responded to appropriate treatment. As of 06/19/12, mental status is at baseline.  5. Atrial fibrillation with RVR: Heart rate was transiently elevated, but is now controlled. Probably from the infection. Continued on Metoprolol and Coumadin. Dosing as per pharmacy.  6. Hyponatremia: This was mild, and likely due to pneumonia. As of 06/19/12, this had resolved.  7. Anemia: Normocytic. Probably from AOCD. Stable/reasonable.  8. Diabetes Mellitus: This is insulin-requiring type 2. CBGs are still elevated in the middle 300s. On diet,SSI, scheduled Levemir and meal cover. Adjusting as indicated.   Code Status: Full Code.  Family Communication:  Disposition Plan: To be determined.    Brief narrative: 78 year old female, with history of DM-2, iron-deficiency anemia, hiatal hernia, GERD, HTN, dyslipidemia, hypothyroidism, rosacea, chronic back pain, recurrent UTIs, IBS, vertigo, and chronic atrial fibrillation currently on Coumadin, presenting with coughing for one week, decreased appetite and one day of confusion.  No report of fever, chills, nausea, vomiting or diarrhea. Her son brought her to the ED, because she was not able to get up from bed. She was admitted for further evaluation and treatment.    Consultants:  N/A.   Procedures:  CXR.   Antibiotics:  Rocephin 06/15/12-06/16/12.  Azithromycin 06/16/12.   Zosyn 06/17/12>>>  Vancomycin 06/17/12>>>  HPI/Subjective: No new issues.  Objective: Vital signs in last 24 hours: Temp:  [97.5 F (36.4 C)-98.7 F (37.1 C)] 98.7 F (37.1 C) (11/20 0524) Pulse Rate:  [86-136] 136  (11/20 1215) Resp:  [18-20] 18  (11/20 0524) BP: (137-157)/(50-115) 157/86 mmHg (11/20 0524) SpO2:  [94 %-99 %] 94 % (11/20 1215) Weight:  [112.4 kg (247 lb 12.8 oz)] 112.4 kg (247 lb 12.8 oz) (11/20 0524) Weight change: 0.1 kg (3.5 oz) Last BM Date:  06/15/12  Intake/Output  from previous day: 11/19 0701 - 11/20 0700 In: 1984.5 [P.O.:880; I.V.:304.5; IV Piggyback:800] Out: 4250 [Urine:4250]     Physical Exam: General: Sitting in chair, garrulous. Comfortable, alert, communicative, fully oriented, not short of breath at rest.  HEENT:  Mild clinical pallor, no jaundice, no conjunctival injection or discharge. NECK:  Supple, JVP not seen, no carotid bruits, no palpable lymphadenopathy, no palpable goiter. CHEST:  Clinically clear to auscultation, no wheezes, no crackles. HEART:  Sounds 1 and 2 heard, normal, regular, no murmurs. ABDOMEN:  Obese, soft, non-tender, no palpable organomegaly, no palpable masses, normal bowel sounds. GENITALIA:  Not examined. LOWER EXTREMITIES:  No pitting edema, palpable peripheral pulses. MUSCULOSKELETAL SYSTEM:  Generalized osteoarthritic changes, otherwise, normal. CENTRAL NERVOUS SYSTEM:  No focal neurologic deficit on gross examination.  Lab Results:  [facility] 06/20/12 0325 06/19/12 0505  WBC 14.4* 16.7*  HGB 12.0 11.1*  HCT 36.2 33.8*  PLT 481* 439*    Basename 06/20/12 0325 06/19/12 0505  NA 135 137  K 3.8 3.7  CL 95* 96  CO2 31 31  GLUCOSE 372* 341*  BUN 40* 37*  CREATININE 1.20* 1.20*  CALCIUM 8.9 9.1   Recent Results (from the past 240 hour(s))  URINE CULTURE     Status: Normal   Collection Time   06/15/12 11:04 AM      Component Value Range Status Comment   Specimen Description URINE, CATHETERIZED   Final    Special Requests NONE   Final    Culture  Setup Time 06/15/2012 21:56   Final    Colony Count >=100,000 COLONIES/ML   Final    Culture     Final    Value: Multiple bacterial morphotypes present, none predominant. Suggest appropriate recollection if clinically indicated.   Report Status 06/16/2012 FINAL   Final   CULTURE, BLOOD (ROUTINE X 2)     Status: Normal   Collection Time   06/15/12  4:30 PM      Component Value Range Status Comment   Specimen Description BLOOD  LEFT HAND   Final    Special Requests BOTTLES DRAWN AEROBIC ONLY 10CC   Final    Culture  Setup Time 06/16/2012 01:07   Final    Culture     Final    Value: STREPTOCOCCUS PNEUMONIAE     Note: Gram Stain Report Called to,Read Back By and Verified With: DAWN LARSON 06/16/12 @ 2:40PM BY RUSCA.   Report Status 06/18/2012 FINAL   Final    Organism ID, Bacteria STREPTOCOCCUS PNEUMONIAE   Final   CULTURE, BLOOD (ROUTINE X 2)     Status: Normal   Collection Time   06/15/12  4:50 PM      Component Value Range Status Comment   Specimen Description BLOOD RIGHT ARM   Final    Special Requests BOTTLES DRAWN AEROBIC AND ANAEROBIC 10CC   Final    Culture  Setup Time 06/16/2012 01:07   Final    Culture     Final    Value: STREPTOCOCCUS PNEUMONIAE     Note: SUSCEPTIBILITIES PERFORMED ON PREVIOUS CULTURE WITHIN THE LAST 5 DAYS.     Note: Gram Stain Report Called to,Read Back By and Verified With: TIVET WOODS 06/16/12 @ 9:20PM BY RUSCA.   Report Status 06/18/2012 FINAL   Final   CULTURE, BLOOD (ROUTINE X 2)     Status: Normal (Preliminary result)   Collection Time   06/16/12  7:35 PM      Component Value Range Status  Comment   Specimen Description BLOOD RIGHT ARM  5 ML IN [facility] BOTTLE   Final    Special Requests Normal   Final    Culture  Setup Time 06/17/2012 13:15   Final    Culture     Final    Value: STREPTOCOCCUS SPECIES     Note: Gram Stain Report Called to,Read Back By and Verified With: Mort Sawyers RN on 06/18/12 at 05:35 by Christie Nottingham   Report Status PENDING   Incomplete   CULTURE, BLOOD (ROUTINE X 2)     Status: Normal (Preliminary result)   Collection Time   06/16/12  7:40 PM      Component Value Range Status Comment   Specimen Description BLOOD RIGHT ARM  5 ML IN [facility] BOTTLE   Final    Special Requests Normal   Final    Culture  Setup Time 06/17/2012 13:15   Final    Culture     Final    Value: GRAM POSITIVE COCCI IN PAIRS     Note: Gram Stain Report Called to,Read Back By and  Verified With: AMANDA THOMPSON @0949  06/19/12 BY KRAWS   Report Status PENDING   Incomplete   MRSA PCR SCREENING     Status: Normal   Collection Time   06/16/12  8:32 PM      Component Value Range Status Comment   MRSA by PCR NEGATIVE  NEGATIVE Final   CULTURE, BLOOD (ROUTINE X 2)     Status: Normal (Preliminary result)   Collection Time   06/17/12  3:16 PM      Component Value Range Status Comment   Specimen Description BLOOD RIGHT HAND   Final    Special Requests BOTTLES DRAWN AEROBIC AND ANAEROBIC 5CC EACH   Final    Culture  Setup Time 06/17/2012 18:39   Final    Culture     Final    Value:        BLOOD CULTURE RECEIVED NO GROWTH TO DATE CULTURE WILL BE HELD FOR 5 DAYS BEFORE ISSUING A FINAL NEGATIVE REPORT   Report Status PENDING   Incomplete   CULTURE, BLOOD (ROUTINE X 2)     Status: Normal (Preliminary result)   Collection Time   06/17/12  3:19 PM      Component Value Range Status Comment   Specimen Description BLOOD RIGHT ARM   Final    Special Requests BOTTLES DRAWN AEROBIC AND ANAEROBIC 3CC EACH   Final    Culture  Setup Time 06/17/2012 18:39   Final    Culture     Final    Value:        BLOOD CULTURE RECEIVED NO GROWTH TO DATE CULTURE WILL BE HELD FOR 5 DAYS BEFORE ISSUING A FINAL NEGATIVE REPORT   Report Status PENDING   Incomplete   CULTURE, EXPECTORATED SPUTUM-ASSESSMENT     Status: Normal   Collection Time   06/18/12  4:33 AM      Component Value Range Status Comment   Specimen Description SPUTUM   Final    Special Requests Normal   Final    Sputum evaluation     Final    Value: THIS SPECIMEN IS ACCEPTABLE. RESPIRATORY CULTURE REPORT TO FOLLOW.   Report Status 06/18/2012 FINAL   Final   CULTURE, RESPIRATORY     Status: Normal (Preliminary result)   Collection Time   06/18/12  4:33 AM      Component Value Range Status Comment  Specimen Description SPUTUM   Final    Special Requests NONE   Final    Gram Stain     Final    Value: RARE WBC PRESENT, PREDOMINANTLY  PMN     RARE SQUAMOUS EPITHELIAL CELLS PRESENT     RARE GRAM POSITIVE COCCI     IN PAIRS RARE GRAM POSITIVE RODS   Culture Culture reincubated for better growth   Final    Report Status PENDING   Incomplete      Studies/Results: No results found.  Medications: Scheduled Meds:   . acetaminophen  500 mg Oral BID  . cefTRIAXone (ROCEPHIN)  IV  2 g Intravenous Q24H  . furosemide  20 mg Oral BID  . insulin aspart  0-20 Units Subcutaneous TID WC  . insulin aspart  0-5 Units Subcutaneous QHS  . insulin aspart  6 Units Subcutaneous TID WC  . insulin detemir  60 Units Subcutaneous Q breakfast  . ipratropium  0.5 mg Nebulization Q6H  . levalbuterol  0.63 mg Nebulization Q6H  . levothyroxine  150 mcg Oral Daily  . [COMPLETED] magnesium sulfate 1 - 4 g bolus IVPB  4 g Intravenous Once  . methylPREDNISolone (SOLU-MEDROL) injection  60 mg Intravenous Q12H  . metoprolol  5 mg Intravenous Once  . metoprolol  25 mg Oral BID  . simvastatin  40 mg Oral QHS  . sodium chloride  10-40 mL Intracatheter Q12H  . sodium chloride  3 mL Intravenous Q12H  . warfarin  2.5 mg Oral ONCE-1800  . [COMPLETED] warfarin  5 mg Oral ONCE-1800  . Warfarin - Pharmacist Dosing Inpatient   Does not apply q1800  . [DISCONTINUED] piperacillin-tazobactam (ZOSYN)  IV  3.375 g Intravenous Q8H  . [DISCONTINUED] vancomycin  1,500 mg Intravenous Q24H   Continuous Infusions:   . sodium chloride 20 mL/hr (06/20/12 0033)   PRN Meds:.acetaminophen, acetaminophen, guaiFENesin, levalbuterol, metoprolol, ondansetron (ZOFRAN) IV, ondansetron, polyethylene glycol, sodium chloride    LOS: 5 days   Lael Pilch,CHRISTOPHER  [facility] Pager [phone]. If 8PM-8AM, please contact night-coverage at [email or web address], password [facility] 06/20/2012, 1:23 PM  LOS: 5 days

## 2012-06-20 NOTE — Progress Notes (Signed)
Agree with previous RN shift assessment of pt.  Will continue to monitor pt throughout the rest of the night.

## 2012-06-21 DIAGNOSIS — E119 Type 2 diabetes mellitus without complications: Secondary | ICD-10-CM

## 2012-06-21 LAB — CBC
HCT: 37.7 % (ref 36.0–46.0)
MCHC: 32.9 g/dL (ref 30.0–36.0)
MCV: 92.4 fL (ref 78.0–100.0)
RDW: 13.2 % (ref 11.5–15.5)

## 2012-06-21 LAB — BASIC METABOLIC PANEL
BUN: 41 mg/dL — ABNORMAL HIGH (ref 6–23)
Creatinine, Ser: 1.13 mg/dL — ABNORMAL HIGH (ref 0.50–1.10)
GFR calc Af Amer: 49 mL/min — ABNORMAL LOW (ref >90)
GFR calc non Af Amer: 42 mL/min — ABNORMAL LOW (ref >90)

## 2012-06-21 LAB — GLUCOSE, CAPILLARY: Glucose-Capillary: 296 mg/dL — ABNORMAL HIGH (ref 70–99)

## 2012-06-21 LAB — CULTURE, RESPIRATORY W GRAM STAIN: Culture: NORMAL

## 2012-06-21 MED ORDER — DEXTROSE 5 % IV SOLN: 2.0000 g | INTRAVENOUS | Status: AC

## 2012-06-21 MED ORDER — HEPARIN SOD (PORK) LOCK FLUSH 100 UNIT/ML IV SOLN
500.0000 [IU] | Freq: Once | INTRAVENOUS | Status: AC
  Administered 2012-06-21: 250 [IU] via INTRAVENOUS

## 2012-06-21 MED ORDER — INSULIN ASPART 100 UNIT/ML ~~LOC~~ SOLN: 6.0000 [IU] | Freq: Three times a day (TID) | SUBCUTANEOUS | Status: DC

## 2012-06-21 MED ORDER — FUROSEMIDE 20 MG PO TABS: 20.0000 mg | ORAL_TABLET | Freq: Two times a day (BID) | ORAL | Status: DC

## 2012-06-21 MED ORDER — INSULIN DETEMIR 100 UNIT/ML ~~LOC~~ SOLN: 70.0000 [IU] | SUBCUTANEOUS | Status: DC

## 2012-06-21 NOTE — Progress Notes (Signed)
Agree with previous RN shift assessment.  Will continue to monitor pt throughout rest of the night.

## 2012-06-21 NOTE — Progress Notes (Signed)
Per Margie with [facility], patient does not qualify for their services ( life expectancy greater than 6 months). Patient and family members wanted hospice because they thought that she could get more services. Patient/ son is agreeable to have [facility] to provide [facility] services upon discharge as planned. Norberta Keens RN with [facility] called for arrangements. Talked to patient's son, no questions at this time. Abelino Derrick RN,BSN,MHA

## 2012-06-21 NOTE — Discharge Summary (Signed)
Physician Discharge Summary  [name] [facility] ZOX:[id number] DOB: 1923-01-21 DOA: 06/15/2012  PCP: [name], MD  Admit date: 06/15/2012 Discharge date: 06/21/2012  Time spent: 40 minutes  Recommendations for Outpatient Follow-up:  1. Follow up with PMD. 2. Home health care. PT/OT/RN for antibiotics.   Discharge Diagnoses:  Principal Problem:  *Sepsis(995.91) Active Problems:  HYPERTENSION  UTI (lower urinary tract infection)  Metabolic encephalopathy  Atrial fibrillation with RVR  Hyponatremia  CAP (community acquired pneumonia)  Anemia   Discharge Condition: Satisfactory.   Diet recommendation: Heart-Heathy/Carbohydrate-Modified.   Filed Weights   06/18/12 1809 06/20/12 0524 06/21/12 0449  Weight: 112.3 kg (247 lb 9.2 oz) 112.4 kg (247 lb 12.8 oz) 112.5 kg (248 lb 0.3 oz)    History of present illness:  78 year old female, with history of DM-2, iron-deficiency anemia, hiatal hernia, GERD, HTN, dyslipidemia, hypothyroidism, rosacea, chronic back pain, recurrent UTIs, IBS, vertigo, and chronic atrial fibrillation currently on Coumadin, presenting with coughing for one week, decreased appetite and one day of confusion. No report of fever, chills, nausea, vomiting or diarrhea. Her son brought her to the ED, because she was not able to get up from bed. She was admitted for further evaluation and treatment.    Hospital Course:  1. CAP/Gram positive cocci bacteremia/ UTI:  Patient presented with altered mental status, described as confusion, preceded by one weak of cough. Urinalysis demonstrated a positive sediment, consistent with UTI. CXR showed patchy parenchymal disease that could represent airspace disease and infection, while follow up chest CT scan revealed multilobar pneumonia in the apex of the left upper lobe and posterior aspect of the right middle lobe (lateral segment) as well as trace bilateral pleural effusions and mild cylindrical bronchiectasis in the lower lobes  of the lungs bilaterally. Patient was initially managed with iv Rocephin/Azithromycin, blood culture grew gram positive bacteremia, and patient was transitioned to iv Vancomycin/Zosyn, as of 06/17/12. The culprit organism was identified as streptococcus pneumoniae in 2:2 bottles. Dr [name] provided ID consultation, and as of 06/19/12, patient's antibiotic therapy was rationalized to monotherapy with Rocephin. Clinically, she has significantly improved, and wcc is trending down  Nicely from 22.7 at presentation, to 13.4 on 06/21/12. 2 D echocardiogram showed no evidence of endocarditis. Repeat blood done on 06/17/12, have showed no growth so far. IV Rocephin will be concluded on 06/26/12.  2. Acute respiratory failure: Patient was short of breath at presentation, consistent with acute respiratory failure, secondary to multi-focal pneumonia and possibly mild congestive heart failure. Her Probnp was elevated at 3124.0, she was commenced on IV lasix 20 mg BID and diuresed about 3.5 liters. She has since been transitioned to oral diuretics, without deleterious effect. \, and respiratory status is now stable.  3. Hypokalemia: Repleted as indicated.  4. Toxic-Metabolic encephalopathy: This was due to infective problems, ad appears to have responded to appropriate treatment. As of 06/19/12, mental status is at baseline.  5. Atrial fibrillation with RVR: Heart rate was transiently elevated, but is now controlled. Probably from the infection. Continued on Metoprolol and Coumadin. Dosing as per pharmacy.  6. Hyponatremia: This was mild, and likely due to pneumonia. As of 06/19/12, this had resolved.  7. Anemia: Normocytic. Probably from AOCD. Stable/reasonable.  8. Diabetes Mellitus: This is insulin-requiring type 2. CBGs are still elevated in the middle 300s. Managed with diet,SSI, scheduled Levemir and meal cover. Adjusted as indicated. HBA1C was 7.9.   Procedures:  See below.   Consultations:  Dr  Donzetta Sprung hatcher.  Discharge Exam: Filed Vitals:   06/20/12 2108 06/21/12 0444 06/21/12 0449 06/21/12 0917  BP: 152/80 150/120 158/81   Pulse: 53 67    Temp: 97.6 F (36.4 C) 97.3 F (36.3 C)    TempSrc: Oral Oral    Resp: 18 18    Height:      Weight:   112.5 kg (248 lb 0.3 oz)   SpO2: 97% 100%  97%    General: Garrulous. Comfortable, alert, communicative, fully oriented, not short of breath at rest.  HEENT: Mild clinical pallor, no jaundice, no conjunctival injection or discharge.  NECK: Supple, JVP not seen, no carotid bruits, no palpable lymphadenopathy, no palpable goiter.  CHEST: Clinically clear to auscultation, no wheezes, no crackles.  HEART: Sounds 1 and 2 heard, normal, regular, no murmurs.  ABDOMEN: Obese, soft, non-tender, no palpable organomegaly, no palpable masses, normal bowel sounds.  GENITALIA: Not examined.  LOWER EXTREMITIES: No pitting edema, palpable peripheral pulses.  MUSCULOSKELETAL SYSTEM: Generalized osteoarthritic changes, otherwise, normal.  CENTRAL NERVOUS SYSTEM: No focal neurologic deficit on gross examination.  Discharge Instructions      Discharge Orders    Future Appointments: Provider: Department: Dept Phone: Center:   07/18/2012 2:00 PM Judy Pimple, MD [facility] [phone] [id number]     Future Orders Please Complete By Expires   Diet - low sodium heart healthy      Diet Carb Modified      Increase activity slowly          Medication List     As of 06/21/2012 10:38 AM    STOP taking these medications         indapamide 2.5 MG tablet   Commonly known as: LOZOL      loperamide 2 MG tablet   Commonly known as: IMODIUM A-D      TAKE these medications         acetaminophen 500 MG tablet   Commonly known as: TYLENOL   Take 500 mg by mouth 2 (two) times daily.      dextrose 5 % SOLN 50 mL with cefTRIAXone 2 G SOLR 2 g   Inject 2 g into the vein daily.      furosemide 20 MG tablet   Commonly  known as: LASIX   Take 1 tablet (20 mg total) by mouth 2 (two) times daily.      glucose blood test strip   Use as instructed      insulin aspart 100 UNIT/ML injection   Commonly known as: novoLOG   Inject 6 Units into the skin 3 (three) times daily before meals.      insulin detemir 100 UNIT/ML injection   Commonly known as: LEVEMIR   Inject 70 Units into the skin every morning.      Insulin Pen Needle 31G X 5 MM Misc   Use as directed for insulin pen.      Insulin Syringe-Needle U-100 31G X 15/64" 1 ML Misc   Inject 1 each into the skin as directed. Use as directed with insulin injections      levothyroxine 150 MCG tablet   Commonly known as: SYNTHROID, LEVOTHROID   Take 150 mcg by mouth daily.      meclizine 25 MG tablet   Commonly known as: ANTIVERT   Take 25 mg by mouth 3 (three) times daily as needed. For vertigo      methocarbamol 500 MG tablet   Commonly known as: ROBAXIN  Take 1 tablet (500 mg total) by mouth 3 (three) times daily as needed. For muscle spasm      metoprolol 50 MG tablet   Commonly known as: LOPRESSOR   Take 0.5 tablets (25 mg total) by mouth 2 (two) times daily.      omeprazole 20 MG capsule   Commonly known as: PRILOSEC   Take 20 mg by mouth daily.      potassium chloride SA 20 MEQ tablet   Commonly known as: K-DUR,KLOR-CON   Take 20 mEq by mouth 2 (two) times daily.      simvastatin 40 MG tablet   Commonly known as: ZOCOR   Take 1 tablet (40 mg total) by mouth at bedtime.      traMADol 50 MG tablet   Commonly known as: ULTRAM   Take 100 mg by mouth 3 (three) times daily as needed. pain      warfarin 5 MG tablet   Commonly known as: COUMADIN   Take 2.5 mg by mouth every evening. Take 5 mg Tues, Thurs & Saturday [2.5 mg other days].        Follow-up Information    Follow up with [name], MD. Call in 1 week.   Contact information:   [address] [place] [address] [place] [place] [address] [phone]             The results of significant diagnostics from this hospitalization (including imaging, microbiology, ancillary and laboratory) are listed below for reference.    Significant Diagnostic Studies: Dg Chest 2 View  06/15/2012  *RADIOLOGY REPORT*  Clinical Data: Shortness of breath.  Weakness.  Urinary tract infection.  CHEST - 2 VIEW  Comparison: 10/15/2011  Findings: Low lung volumes seen however both lungs are clear. Heart size is stable within normal limits.  No evidence of pleural effusion.  IMPRESSION: Low lung volumes.  No acute findings.   Original Report Authenticated By: Myles Rosenthal, M.D.    Ct Head Wo Contrast  05/22/2012  This examination was performed at [facility]. The interpretation will be provided by [facility]   Original Report Authenticated By: Fuller Canada, M.D.    Ct Chest W Contrast  06/16/2012  *RADIOLOGY REPORT*  Clinical Data: Cough and crackles.  Leukocytosis.  CT CHEST WITH CONTRAST  Technique:  Multidetector CT imaging of the chest was performed following the standard protocol during bolus administration of intravenous contrast.  Contrast: OMNIPAQUE IOHEXOL 300 MG/ML  SOLN  Comparison: Chest CT 01/14/2005.  Findings:  Mediastinum: Heart size is mildly enlarged. There is no significant pericardial fluid, thickening or pericardial calcification. There is atherosclerosis of the thoracic aorta, the great vessels of the mediastinum and the coronary arteries, including calcified atherosclerotic plaque in the left main, left anterior descending and right coronary arteries. There are numerous borderline enlarged and mildly enlarged mediastinal lymph nodes, largest of which measures up to 1.2 cm in short axis in the right paratracheal station.  Esophagus is unremarkable in appearance.  Lungs/Pleura: Extensive air space consolidation is noted in the apex of the left upper lobe and the posterior aspect of the right middle lobe, concerning for  multilobar pneumonia.  Mild diffuse bronchial wall thickening is noted.  Trace bilateral pleural effusions and some dependent atelectasis in the lower lobes of the lungs bilaterally.  Mild bilateral lower lobe cylindrical bronchiectasis. No definite suspicious appearing pulmonary nodules.  Upper Abdomen: Linear areas of ill-defined low attenuation in the liver adjacent to the  fissure for the ligamentum venosum is likely to represent focal fatty infiltration.  Nonspecific stranding in the left upper quadrant adjacent to the upper pole of the left kidney.  Musculoskeletal: There are no aggressive appearing lytic or blastic lesions noted in the visualized portions of the skeleton.  IMPRESSION: 1.  Multilobar pneumonia in the apex of the left upper lobe and posterior aspect of the right middle lobe (lateral segment). 2.  Trace bilateral pleural effusions. 3.  Mild cylindrical bronchiectasis in the lower lobes of the lungs bilaterally. 4.  Nonspecific stranding in the left upper quadrant of the abdomen.  This could be related to baseline renal insufficiency, however, clinical correlation for signs and symptoms of pancreatitis may be warranted.  If there is any elevation of lipase levels, further evaluation with dedicated abdominal and pelvic imaging may be warranted.  5.  Atherosclerosis, including left main and two-vessel coronary artery disease.   Original Report Authenticated By: Trudie Reed, M.D.    Dg Chest Port 1 View  06/17/2012  *RADIOLOGY REPORT*  Clinical Data: PICC line adjustment  PORTABLE CHEST - 1 VIEW  Comparison: Portable exam 2000 hours compared to 1830 hours  Findings: Tip of right arm PICC line projects over SVC above cavoatrial junction. Upper normal heart size. Atherosclerotic calcification aorta. Persistent right middle lobe and left upper lobe infiltrates. Right glenohumeral degenerative changes noted. No pleural effusion or pneumothorax.  IMPRESSION: Tip of right arm PICC line projects  over SVC above cavoatrial junction. Persistent pulmonary infiltrates.   Original Report Authenticated By: Ulyses Southward, M.D.    Dg Chest Port 1 View  06/17/2012  *RADIOLOGY REPORT*  Clinical Data: Line placement.  PICC placement.  PORTABLE CHEST - 1 VIEW  Comparison: 06/16/2012.  Findings: New right upper extremity PICC is present with the tip in the right brachiocephalic vein.  Low volume chest.  Lungs appear unchanged allowing for lower volumes.  IMPRESSION: New right upper extremity PICC with the tip in the right brachiocephalic vein.  Line should be advanced 8 cm for positioning at the cavoatrial junction.   Original Report Authenticated By: Andreas Newport, M.D.    Portable Chest 1 View  06/16/2012  *RADIOLOGY REPORT*  Clinical Data: Cough and congestion.  PORTABLE CHEST - 1 VIEW  Comparison: 06/15/2012  Findings: Semi upright portable view of the chest was obtained. The patient continues to have slightly low lung volumes.  There are asymmetric densities in the left upper lung and apex region.  There is concern for airspace disease and cannot exclude a lesion in this area.  There are streaky densities at the left lung base and right mid lung.  Heart and mediastinum are within normal limits.  The thoracic aorta is heavily calcified.  IMPRESSION: There is concern for patchy parenchymal disease that could represent airspace disease and infection.  In particular, there is concern for opacities in the left apical region. This area could be further evaluated with a chest CT to exclude an underlying lesion.  These results will be called to the ordering clinician or representative by the Radiologist Assistant, and communication documented in the PACS Dashboard.   Original Report Authenticated By: Richarda Overlie, M.D.     Microbiology: Recent Results (from the past 240 hour(s))  URINE CULTURE     Status: Normal   Collection Time   06/15/12 11:04 AM      Component Value Range Status Comment   Specimen  Description URINE, CATHETERIZED   Final    Special Requests NONE  Final    Culture  Setup Time 06/15/2012 21:56   Final    Colony Count >=100,000 COLONIES/ML   Final    Culture     Final    Value: Multiple bacterial morphotypes present, none predominant. Suggest appropriate recollection if clinically indicated.   Report Status 06/16/2012 FINAL   Final   CULTURE, BLOOD (ROUTINE X 2)     Status: Normal   Collection Time   06/15/12  4:30 PM      Component Value Range Status Comment   Specimen Description BLOOD LEFT HAND   Final    Special Requests BOTTLES DRAWN AEROBIC ONLY 10CC   Final    Culture  Setup Time 06/16/2012 01:07   Final    Culture     Final    Value: STREPTOCOCCUS PNEUMONIAE     Note: Gram Stain Report Called to,Read Back By and Verified With: DAWN LARSON 06/16/12 @ 2:40PM BY RUSCA.   Report Status 06/18/2012 FINAL   Final    Organism ID, Bacteria STREPTOCOCCUS PNEUMONIAE   Final   CULTURE, BLOOD (ROUTINE X 2)     Status: Normal   Collection Time   06/15/12  4:50 PM      Component Value Range Status Comment   Specimen Description BLOOD RIGHT ARM   Final    Special Requests BOTTLES DRAWN AEROBIC AND ANAEROBIC 10CC   Final    Culture  Setup Time 06/16/2012 01:07   Final    Culture     Final    Value: STREPTOCOCCUS PNEUMONIAE     Note: SUSCEPTIBILITIES PERFORMED ON PREVIOUS CULTURE WITHIN THE LAST 5 DAYS.     Note: Gram Stain Report Called to,Read Back By and Verified With: TIVET WOODS 06/16/12 @ 9:20PM BY RUSCA.   Report Status 06/18/2012 FINAL   Final   CULTURE, BLOOD (ROUTINE X 2)     Status: Normal (Preliminary result)   Collection Time   06/16/12  7:35 PM      Component Value Range Status Comment   Specimen Description BLOOD RIGHT ARM  5 ML IN [facility] BOTTLE   Final    Special Requests Normal   Final    Culture  Setup Time 06/17/2012 13:15   Final    Culture     Final    Value: STREPTOCOCCUS SPECIES     Note: Gram Stain Report Called to,Read Back By and Verified  With: Mort Sawyers RN on 06/18/12 at 05:35 by Christie Nottingham   Report Status PENDING   Incomplete   CULTURE, BLOOD (ROUTINE X 2)     Status: Normal (Preliminary result)   Collection Time   06/16/12  7:40 PM      Component Value Range Status Comment   Specimen Description BLOOD RIGHT ARM  5 ML IN [facility] BOTTLE   Final    Special Requests Normal   Final    Culture  Setup Time 06/17/2012 13:15   Final    Culture     Final    Value: STREPTOCOCCUS PNEUMONIAE     Note: Gram Stain Report Called to,Read Back By and Verified With: AMANDA THOMPSON @0949  06/19/12 BY KRAWS   Report Status PENDING   Incomplete   MRSA PCR SCREENING     Status: Normal   Collection Time   06/16/12  8:32 PM      Component Value Range Status Comment   MRSA by PCR NEGATIVE  NEGATIVE Final   CULTURE, BLOOD (ROUTINE X 2)  Status: Normal (Preliminary result)   Collection Time   06/17/12  3:16 PM      Component Value Range Status Comment   Specimen Description BLOOD RIGHT HAND   Final    Special Requests BOTTLES DRAWN AEROBIC AND ANAEROBIC 5CC EACH   Final    Culture  Setup Time 06/17/2012 18:39   Final    Culture     Final    Value:        BLOOD CULTURE RECEIVED NO GROWTH TO DATE CULTURE WILL BE HELD FOR 5 DAYS BEFORE ISSUING A FINAL NEGATIVE REPORT   Report Status PENDING   Incomplete   CULTURE, BLOOD (ROUTINE X 2)     Status: Normal (Preliminary result)   Collection Time   06/17/12  3:19 PM      Component Value Range Status Comment   Specimen Description BLOOD RIGHT ARM   Final    Special Requests BOTTLES DRAWN AEROBIC AND ANAEROBIC 3CC EACH   Final    Culture  Setup Time 06/17/2012 18:39   Final    Culture     Final    Value:        BLOOD CULTURE RECEIVED NO GROWTH TO DATE CULTURE WILL BE HELD FOR 5 DAYS BEFORE ISSUING A FINAL NEGATIVE REPORT   Report Status PENDING   Incomplete   CULTURE, EXPECTORATED SPUTUM-ASSESSMENT     Status: Normal   Collection Time   06/18/12  4:33 AM      Component Value Range Status  Comment   Specimen Description SPUTUM   Final    Special Requests Normal   Final    Sputum evaluation     Final    Value: THIS SPECIMEN IS ACCEPTABLE. RESPIRATORY CULTURE REPORT TO FOLLOW.   Report Status 06/18/2012 FINAL   Final   CULTURE, RESPIRATORY     Status: Normal   Collection Time   06/18/12  4:33 AM      Component Value Range Status Comment   Specimen Description SPUTUM   Final    Special Requests NONE   Final    Gram Stain     Final    Value: RARE WBC PRESENT, PREDOMINANTLY PMN     RARE SQUAMOUS EPITHELIAL CELLS PRESENT     RARE GRAM POSITIVE COCCI     IN PAIRS RARE GRAM POSITIVE RODS   Culture NORMAL OROPHARYNGEAL FLORA   Final    Report Status 06/21/2012 FINAL   Final      Labs: Basic Metabolic Panel:  Lab 06/21/12 4696 06/20/12 0325 06/20/12 0025 06/19/12 0505 06/18/12 0420 06/17/12 1025  NA 137 135 -- 137 134* 134*  K 3.5 3.8 -- 3.7 3.9 3.0*  CL 94* 95* -- 96 95* 96  CO2 34* 31 -- 31 27 26   GLUCOSE 270* 372* -- 341* 255* 197*  BUN 41* 40* -- 37* 27* 21  CREATININE 1.13* 1.20* -- 1.20* 1.13* 1.04  CALCIUM 9.2 8.9 -- 9.1 8.8 8.5  MG -- -- 1.2* -- -- --  PHOS -- -- -- -- -- --   Liver Function Tests:  Lab 06/16/12 0930  AST 19  ALT 11  ALKPHOS 60  BILITOT 0.7  PROT 6.9  ALBUMIN 2.3*   No results found for this basename: LIPASE:5,AMYLASE:5 in the last 168 hours No results found for this basename: AMMONIA:5 in the last 168 hours CBC:  Lab 06/21/12 0457 06/20/12 0325 06/19/12 0505 06/18/12 0420 06/17/12 1025 06/15/12 1105  WBC 13.4* 14.4* 16.7* 14.3* 16.3* --  NEUTROABS -- -- -- -- -- 19.1*  HGB 12.4 12.0 11.1* 11.1* 10.9* --  HCT 37.7 36.2 33.8* 33.1* 32.8* --  MCV 92.4 92.8 93.4 91.7 91.1 --  PLT 500* 481* 439* 365 314 --   Cardiac Enzymes: No results found for this basename: CKTOTAL:5,CKMB:5,CKMBINDEX:5,TROPONINI:5 in the last 168 hours BNP: BNP (last 3 results)  Basename 06/20/12 0325 06/16/12 1948 10/15/11 1202  PROBNP 3396.0* 3124.0*  1234.0*   CBG:  Lab 06/21/12 0751 06/20/12 2107 06/20/12 1639 06/20/12 1114 06/20/12 0733  GLUCAP 296* 315* 248* 335* 355*       Signed:  Donnis Pecha,CHRISTOPHER  [facility] 06/21/2012, 10:38 AM

## 2012-06-21 NOTE — Progress Notes (Signed)
Notified by [facility] of patient/family interest in services of [facility]) after discharge; spoke at length with patient last evening and with patient and son Loraine Leriche via phone this morning -patient current decline appears related to acute infectious process which is being appropriately treated; patient should be reassessed for return to baseline by PCP who can determine if hospice re-evaluation will be appropriate at that time   patient information was reviewed with [facility] Medical Director; currently patient does not meet hospice eligibility criteria. Per conversations with patient and son, Loraine Leriche they both have spoken with Dr Brien Few this morning and are in agreement with the plan for return home with Home Health services -son Loraine Leriche would like to hear again from [facility] to confirm services which are being requested from [facility]- this message was left for [facility] to follow-up with son, Loraine Leriche. Patient and family are aware that they and/or PCP can request hospice re-evaluation at any point. Thank you.   Valente David, RN 06/21/2012, 12:03 PM [facility] Palliative Medicine Team RN Liaison [phone]

## 2012-06-22 ENCOUNTER — Telehealth: Payer: Self-pay | Admitting: Family Medicine

## 2012-06-22 LAB — CULTURE, BLOOD (ROUTINE X 2)

## 2012-06-22 NOTE — Telephone Encounter (Signed)
Caller: Danne Baxter RN/; Phone: [phone]; Reason for Call: Danne Baxter RN would like to update Dr.  Milinda Antis on her patient- (1) She is on IV antibiotics until 06/26/12.  (2) Wound care on foot- There is no broken area of skin and no dressings.  There is no wound care to be done because no wound found.  (3) She is suppose to be seen in the office 06/27/12 and should have her coumadin checked while in the office.

## 2012-06-22 NOTE — Telephone Encounter (Signed)
Thanks for the update

## 2012-06-23 LAB — CULTURE, BLOOD (ROUTINE X 2): Culture: NO GROWTH

## 2012-06-26 ENCOUNTER — Telehealth: Payer: Self-pay | Admitting: Radiology

## 2012-06-26 NOTE — Telephone Encounter (Signed)
Order done and in IN box  

## 2012-06-26 NOTE — Telephone Encounter (Signed)
Patient has recently been discharged from the hospital, she said she had home health at her house but they didn't check her INR. Is it possible to have send an order for this? She had rectal bleeding due to hemmeroids, she believes. She is unable to come in for INR

## 2012-06-26 NOTE — Telephone Encounter (Signed)
Spoke with [facility] and they can check pt's INR but they need a written order faxed to [phone]

## 2012-06-26 NOTE — Telephone Encounter (Signed)
Please ask her home care company if they can do an INR- some can and some cannot  If yes- please order one

## 2012-06-26 NOTE — Telephone Encounter (Signed)
Order faxed.

## 2012-06-27 ENCOUNTER — Telehealth: Payer: Self-pay | Admitting: Radiology

## 2012-06-27 ENCOUNTER — Ambulatory Visit: Payer: Medicare Other | Admitting: Family Medicine

## 2012-06-27 NOTE — Telephone Encounter (Signed)
Patient called and lmom asking if she should hold coumadin due to bleeding rectally. She wont come in for an INR

## 2012-06-27 NOTE — Telephone Encounter (Signed)
I thought her home care co. was going to do an INR?  If she is bleeding rectally she needs to be seen - hold coumadin and have a family member take her to the hospital

## 2012-06-27 NOTE — Telephone Encounter (Signed)
Pt declined going to hospital she said she thinks it's a Hemorid, I did convince pt to schedule appt here on Friday, Pt requested appt to Dr. Para March, appt scheduled

## 2012-06-28 ENCOUNTER — Telehealth: Payer: Self-pay | Admitting: Family Medicine

## 2012-06-28 NOTE — Telephone Encounter (Signed)
Called pt.  I am currently sick and will need to cancel clinic tomorrow.  I called pt to reschedule.  Pt agreed.  She'll call tomorrow AM to change appointment.

## 2012-06-29 ENCOUNTER — Encounter: Payer: Self-pay | Admitting: Family Medicine

## 2012-06-29 ENCOUNTER — Ambulatory Visit (INDEPENDENT_AMBULATORY_CARE_PROVIDER_SITE_OTHER): Payer: Medicare Other | Admitting: Family Medicine

## 2012-06-29 ENCOUNTER — Ambulatory Visit: Payer: Medicare Other | Admitting: Family Medicine

## 2012-06-29 VITALS — BP 114/68 | HR 80 | Temp 97.8°F

## 2012-06-29 DIAGNOSIS — I4891 Unspecified atrial fibrillation: Secondary | ICD-10-CM

## 2012-06-29 DIAGNOSIS — Z7901 Long term (current) use of anticoagulants: Secondary | ICD-10-CM

## 2012-06-29 DIAGNOSIS — Z5181 Encounter for therapeutic drug level monitoring: Secondary | ICD-10-CM

## 2012-06-29 DIAGNOSIS — K625 Hemorrhage of anus and rectum: Secondary | ICD-10-CM

## 2012-06-29 LAB — POCT INR: INR: 1.7

## 2012-06-29 NOTE — Patient Instructions (Signed)
2.5 mg daily, check in 1 week w/ home health nurse

## 2012-06-29 NOTE — Progress Notes (Signed)
Nature conservation officer at [facility] [address] [place] [address] Phone: [phone] Fax: [phone]  Date:  06/29/2012   Name:  [name]   DOB:  1922/10/12   MRN:  [id number] Gender: female Age: 78 y.o.  PCP:  [name], MD  Evaluating MD: Hannah Beat, MD   Chief Complaint: Rectal Bleeding   History of Present Illness:  [name] is a 78 y.o. pleasant patient who presents with the following:  Elderly, on Coumadin for AF, had some rectal bleeding, known hemorrhoids, asx x 2 days Stopped 2 doses of coumadin, maybe 3 doses.  Has some hemorrhoids, bleeding, but it stopped now.   Hearing is not too good. Often will want  To rest.   Patient Active Problem List  Diagnosis  . ADENOMATOUS COLONIC POLYP  . DIABETES MELLITUS, TYPE II  . HYPERLIPIDEMIA  . HYPERTENSION  . Atrial fibrillation  . CHRONIC RHINITIS  . GERD  . HIATAL HERNIA  . IRRITABLE BOWEL SYNDROME  . ROSACEA  . INTERTRIGO  . OSTEOPOROSIS  . UNSPECIFIED VITAMIN D DEFICIENCY  . Lump in the abdomen  . Fatigue  . B12 deficiency  . Pain of breast  . Dysphagia  . Generalized weakness  . Fever  . UTI (lower urinary tract infection)  . Sepsis(995.91)  . Influenza B  . Benign positional vertigo  . Bronchitis with flu  . Pain, chronic  . Frequent falls  . DM type 2 with diabetic peripheral neuropathy  . URI (upper respiratory infection)  . Metabolic encephalopathy  . Atrial fibrillation with RVR  . Hyponatremia  . CAP (community acquired pneumonia)  . Anemia    Past Medical History  Diagnosis Date  . Atrial fibrillation   . Diabetes mellitus     type II  . Anemia     Fe deficiency  . GERD (gastroesophageal reflux disease)   . Hyperlipidemia   . Hypertension   . Hypothyroid   . Obesity   . Vaginal dysplasia 1980  . Rosacea   . Chest pain 11/2002    cardiac cath neg   . Back pain   . IBS (irritable bowel syndrome)     with diarrhea  . Asthma     as a child   . Adenomatous colon polyp 12/2007  . Hemorrhoids   . Vertigo   . Hiatal hernia 12/2007    EGD    Past Surgical History  Procedure Date  . Abdominal hysterectomy 1963  . Cholecystectomy 2006  . Knee surgery     Rt TKR 1993 & Lt TKR 2001  . Cervical cone biopsy 1963    D&C  . Appendectomy 1941  . Cystocele repair 1978    /rectocele  . Orif ankle fracture 01/2010    fall/followed by rehab stay  . Cataract extraction 2006    bilateral    History  Substance Use Topics  . Smoking status: Former Games developer  . Smokeless tobacco: Never Used  . Alcohol Use: No    Family History  Problem Relation Age of Onset  . Cancer Mother     leukemia  . Coronary artery disease Mother   . Dementia Mother   . Heart disease Father     CAD  . COPD Sister   . Heart disease Brother   . Diabetes Brother   . Kidney disease Son   . Colon cancer Brother     Allergies  Allergen Reactions  . Insulin Glargine  Itching   . Latex     Rash   . Metformin And Related     Severe diarrhea   . Metoclopramide Hcl     REACTION: unspecified  . Nsaids     REACTION: unspecified  . Promethazine Hcl     REACTION: unspecified    Medication list has been reviewed and updated.  Outpatient Prescriptions Prior to Visit  Medication Sig Dispense Refill  . acetaminophen (TYLENOL) 500 MG tablet Take 500 mg by mouth 2 (two) times daily.       . furosemide (LASIX) 20 MG tablet Take 1 tablet (20 mg total) by mouth 2 (two) times daily.  60 tablet  0  . glucose blood test strip Use as instructed  100 each  12  . insulin aspart (NOVOLOG FLEXPEN) 100 UNIT/ML injection Inject 6 Units into the skin 3 (three) times daily before meals.  1 vial  1  . insulin detemir (LEVEMIR) 100 UNIT/ML injection Inject 70 Units into the skin every morning.  10 mL  1  . Insulin Pen Needle (B-D UF III MINI PEN NEEDLES) 31G X 5 MM MISC Use as directed for insulin pen.  100 each  3  . Insulin Syringe-Needle U-100 (BD INSULIN  SYRINGE ULTRAFINE) 31G X 15/64" 1 ML MISC Inject 1 each into the skin as directed. Use as directed with insulin injections  100 each  11  . levothyroxine (SYNTHROID, LEVOTHROID) 150 MCG tablet Take 150 mcg by mouth daily.      . meclizine (ANTIVERT) 25 MG tablet Take 25 mg by mouth 3 (three) times daily as needed. For vertigo      . methocarbamol (ROBAXIN) 500 MG tablet Take 1 tablet (500 mg total) by mouth 3 (three) times daily as needed. For muscle spasm  270 tablet  3  . metoprolol (LOPRESSOR) 50 MG tablet Take 0.5 tablets (25 mg total) by mouth 2 (two) times daily.  90 tablet  3  . omeprazole (PRILOSEC) 20 MG capsule Take 20 mg by mouth daily.      . potassium chloride SA (K-DUR,KLOR-CON) 20 MEQ tablet Take 20 mEq by mouth 2 (two) times daily.      . simvastatin (ZOCOR) 40 MG tablet Take 1 tablet (40 mg total) by mouth at bedtime.  90 tablet  3  . traMADol (ULTRAM) 50 MG tablet Take 100 mg by mouth 3 (three) times daily as needed. pain      . warfarin (COUMADIN) 5 MG tablet Take 2.5 mg by mouth every evening. Take 5 mg Tues, Thurs & Saturday [2.5 mg other days].       Last reviewed on 06/29/2012 12:50 PM by Shon Millet, CMA  Review of Systems:  As above. No fever or chills.   Physical Examination: Filed Vitals:   06/29/12 1247  BP: 114/68  Pulse: 80  Temp: 97.8 F (36.6 C)  TempSrc: Oral    There is no height or weight on file to calculate BMI. Ideal Body Weight:     GEN: obese in wheelchair HEENT: Atraumatic, Normocephalic.  Ears and Nose: No external deformity. NEURO: wheelchair PSYCH: Normally interactive. Conversant. Not depressed or anxious appearing.  Calm demeanor.    Assessment and Plan:  1. Rectal bleed    Stopped, ok to resume coumadin  Orders Today:  No orders of the defined types were placed in this encounter.    Updated Medication List: (Includes new medications, updates to list, dose adjustments) Meds ordered this encounter  Medications  .  insulin detemir (LEVEMIR) 100 UNIT/ML injection    Sig: Inject 60 Units into the skin every morning.    Medications Discontinued: Medications Discontinued During This Encounter  Medication Reason  . acetaminophen (TYLENOL) 500 MG tablet   . Insulin Syringe-Needle U-100 (BD INSULIN SYRINGE ULTRAFINE) 31G X 15/64" 1 ML MISC   . meclizine (ANTIVERT) 25 MG tablet   . insulin detemir (LEVEMIR) 100 UNIT/ML injection      Hannah Beat, MD

## 2012-07-03 ENCOUNTER — Telehealth: Payer: Self-pay

## 2012-07-03 NOTE — Telephone Encounter (Signed)
Please give verbal order for home health to remove picc line if they no longer need it  Also order for PT/ INR  I will see her at her f/u later this month

## 2012-07-03 NOTE — Telephone Encounter (Signed)
Talked to [facility] and they wanted order in writing, faxed over order

## 2012-07-03 NOTE — Telephone Encounter (Addendum)
While pt was in hospital pt had pic line put in arm to get IV antibiotics. Pt said finished IV antibiotics; pt wants [facility] to remove pic line.pt said line that hangs out of arm bothers pt.Please advise. Pt said last PT/INR done 06/29/12 should pt have PT/INR done by home health.Please advise.

## 2012-07-05 NOTE — Telephone Encounter (Signed)
Pt left v/m requesting answer re: pic line; pt said arm is itching and dressing has not been changed in 10 day. Spoke with Sodonnie,clinical mgr at [facility] [phone]; Sodonnie took verbal order as instructed Re; pic line can be removed if no longer needed. Sodonnie said someone will go out today to see pt and evaluate and remove pic line if needed.pt notified.

## 2012-07-05 NOTE — Telephone Encounter (Signed)
Kim nurse with [facility] said pt is no longer getting IV antibiotics and pt does not need pic line. I restated Dr Royden Purl verbal order on 07/03/12 that if home health did not need pic line it could be removed. Selena Batten said she is off tomorrow so she will go to pts home tonight to remove pic line.

## 2012-07-09 ENCOUNTER — Ambulatory Visit (INDEPENDENT_AMBULATORY_CARE_PROVIDER_SITE_OTHER): Payer: Medicare Other | Admitting: Family Medicine

## 2012-07-09 ENCOUNTER — Encounter: Payer: Self-pay | Admitting: Family Medicine

## 2012-07-09 VITALS — BP 94/58 | HR 85 | Temp 97.6°F

## 2012-07-09 DIAGNOSIS — I4891 Unspecified atrial fibrillation: Secondary | ICD-10-CM

## 2012-07-09 DIAGNOSIS — Z23 Encounter for immunization: Secondary | ICD-10-CM

## 2012-07-09 DIAGNOSIS — E119 Type 2 diabetes mellitus without complications: Secondary | ICD-10-CM

## 2012-07-09 DIAGNOSIS — J189 Pneumonia, unspecified organism: Secondary | ICD-10-CM

## 2012-07-09 LAB — CBC WITH DIFFERENTIAL/PLATELET
Eosinophils Relative: 3.5 % (ref 0.0–5.0)
MCV: 96.1 fl (ref 78.0–100.0)
Monocytes Absolute: 0.6 10*3/uL (ref 0.1–1.0)
Neutrophils Relative %: 63.6 % (ref 43.0–77.0)
Platelets: 209 10*3/uL (ref 150.0–400.0)
WBC: 6.3 10*3/uL (ref 4.5–10.5)

## 2012-07-09 LAB — COMPREHENSIVE METABOLIC PANEL
ALT: 19 U/L (ref 0–35)
AST: 23 U/L (ref 0–37)
Albumin: 3.5 g/dL (ref 3.5–5.2)
Alkaline Phosphatase: 41 U/L (ref 39–117)
Glucose, Bld: 157 mg/dL — ABNORMAL HIGH (ref 70–99)
Potassium: 3.5 mEq/L (ref 3.5–5.1)
Sodium: 142 mEq/L (ref 135–145)
Total Protein: 6.8 g/dL (ref 6.0–8.3)

## 2012-07-09 LAB — PROTIME-INR: Prothrombin Time: 21.8 s — ABNORMAL HIGH (ref 10.2–12.4)

## 2012-07-09 NOTE — Patient Instructions (Addendum)
I'm glad you are feeling better Labs today - we will check your INR/ protime  By blood draw this time  Also flu shot today  Follow up in 3 months  Lets increase your levemir when you are out of the donut hole -- call and let me know when that is  Try to eat a diabetic diet

## 2012-07-09 NOTE — Progress Notes (Signed)
Subjective:    Patient ID: [name], female    DOB: [birth year], 78 y.o.   MRN: [id number]  HPI Here for f/u from hosp  11/15-11/21 CAP with sepsis -- strep pneumo ID consulted and tx with IV rocephin  This did cause some CHF which imp after diuresis Rev hosp records and studies in detail with pt   She is doing a little more at home    a1c in hosp 7.9- had to control sugar with insulin  Hard to get her sugars down below 200  Cannot increase levimir -- will wait to inc it until out of the gap in jan Checks sugar before every meal   Most of the time she takes novolog 6-8 units  Tries not to go over 8 Lab Results  Component Value Date   HGBA1C 7.9* 06/15/2012   not time to check that yet  Still eats a bit irratically   No time to get 3 meals in at times   Needs labs for lytes and cbc today   Is trying not to take more pain med occ a little tylenol  She has chronic let and back pain - is getting   Through with home nurse now  OT and PT come for another week   Needs a protime /INR   She needs a flu shot     bp today94/58-not dizzy   Patient Active Problem List  Diagnosis  . ADENOMATOUS COLONIC POLYP  . DIABETES MELLITUS, TYPE II  . HYPERLIPIDEMIA  . HYPERTENSION  . Atrial fibrillation  . CHRONIC RHINITIS  . GERD  . HIATAL HERNIA  . IRRITABLE BOWEL SYNDROME  . ROSACEA  . INTERTRIGO  . OSTEOPOROSIS  . UNSPECIFIED VITAMIN D DEFICIENCY  . Lump in the abdomen  . Fatigue  . B12 deficiency  . Pain of breast  . Dysphagia  . Generalized weakness  . Fever  . UTI (lower urinary tract infection)  . Sepsis(995.91)  . Influenza B  . Benign positional vertigo  . Bronchitis with flu  . Pain, chronic  . Frequent falls  . DM type 2 with diabetic peripheral neuropathy  . URI (upper respiratory infection)  . Metabolic encephalopathy  . Atrial fibrillation with RVR  . Hyponatremia  . CAP (community acquired pneumonia)  . Anemia   Past Medical History   Diagnosis Date  . Atrial fibrillation   . Diabetes mellitus     type II  . Anemia     Fe deficiency  . GERD (gastroesophageal reflux disease)   . Hyperlipidemia   . Hypertension   . Hypothyroid   . Obesity   . Vaginal dysplasia 1980  . Rosacea   . Chest pain 11/2002    cardiac cath neg   . Back pain   . IBS (irritable bowel syndrome)     with diarrhea  . Asthma     as a child  . Adenomatous colon polyp 12/2007  . Hemorrhoids   . Vertigo   . Hiatal hernia 12/2007    EGD   Past Surgical History  Procedure Date  . Abdominal hysterectomy 1963  . Cholecystectomy 2006  . Knee surgery     Rt TKR 1993 & Lt TKR 2001  . Cervical cone biopsy 1963    D&C  . Appendectomy 1941  . Cystocele repair 1978    /rectocele  . Orif ankle fracture 01/2010    fall/followed by rehab stay  . Cataract extraction 2006    bilateral  History  Substance Use Topics  . Smoking status: Former Games developer  . Smokeless tobacco: Never Used  . Alcohol Use: No   Family History  Problem Relation Age of Onset  . Cancer Mother     leukemia  . Coronary artery disease Mother   . Dementia Mother   . Heart disease Father     CAD  . COPD Sister   . Heart disease Brother   . Diabetes Brother   . Kidney disease Son   . Colon cancer Brother    Allergies  Allergen Reactions  . Insulin Glargine     Itching   . Latex     Rash   . Metformin And Related     Severe diarrhea   . Metoclopramide Hcl     REACTION: unspecified  . Nsaids     REACTION: unspecified  . Promethazine Hcl     REACTION: unspecified   Current Outpatient Prescriptions on File Prior to Visit  Medication Sig Dispense Refill  . furosemide (LASIX) 20 MG tablet Take 1 tablet (20 mg total) by mouth 2 (two) times daily.  60 tablet  0  . glucose blood test strip Use as instructed  100 each  12  . insulin aspart (NOVOLOG FLEXPEN) 100 UNIT/ML injection Inject 6 Units into the skin 3 (three) times daily before meals.  1 vial  1  .  insulin detemir (LEVEMIR) 100 UNIT/ML injection Inject 60 Units into the skin every morning.      . Insulin Pen Needle (B-D UF III MINI PEN NEEDLES) 31G X 5 MM MISC Use as directed for insulin pen.  100 each  3  . levothyroxine (SYNTHROID, LEVOTHROID) 150 MCG tablet Take 150 mcg by mouth daily.      . methocarbamol (ROBAXIN) 500 MG tablet Take 1 tablet (500 mg total) by mouth 3 (three) times daily as needed. For muscle spasm  270 tablet  3  . metoprolol (LOPRESSOR) 50 MG tablet Take 0.5 tablets (25 mg total) by mouth 2 (two) times daily.  90 tablet  3  . omeprazole (PRILOSEC) 20 MG capsule Take 20 mg by mouth daily.      . potassium chloride SA (K-DUR,KLOR-CON) 20 MEQ tablet Take 20 mEq by mouth daily.       . simvastatin (ZOCOR) 40 MG tablet Take 1 tablet (40 mg total) by mouth at bedtime.  90 tablet  3  . traMADol (ULTRAM) 50 MG tablet Take 100 mg by mouth 3 (three) times daily as needed. pain      . warfarin (COUMADIN) 5 MG tablet Take 2.5 mg by mouth as directed.           Review of SystemsReview of Systems  Constitutional: Negative for fever, appetite change, fatigue and unexpected weight change.  Eyes: Negative for pain and visual disturbance.  Respiratory: Negative for cough and shortness of breath.   Cardiovascular: Negative for cp or palpitations    Gastrointestinal: Negative for nausea, diarrhea and constipation.  Genitourinary: Negative for urgency and frequency. neg for excessive thirst  Skin: Negative for pallor or rash   MSK pos for disabling chronic pain in back and extremeties that disable her  Neurological: Negative for weakness, light-headedness, numbness and headaches.  Hematological: Negative for adenopathy. Does not bruise/bleed easily.  Psychiatric/Behavioral: Negative for dysphoric mood. The patient is nervous/anxious.         Objective:   Physical Exam  Constitutional: She appears well-developed and well-nourished. No distress.  Frail appearing obese female  in a wheelchair   HENT:  Head: Normocephalic and atraumatic.  Mouth/Throat: Oropharynx is clear and moist.  Eyes: Conjunctivae normal and EOM are normal. Pupils are equal, round, and reactive to light. Right eye exhibits no discharge. Left eye exhibits no discharge. No scleral icterus.  Neck: Normal range of motion. Neck supple. No JVD present. Carotid bruit is not present. No thyromegaly present.  Cardiovascular: Normal rate, regular rhythm, normal heart sounds and intact distal pulses.  Exam reveals no gallop.   Pulmonary/Chest: Effort normal and breath sounds normal. No respiratory distress. She has no wheezes. She has no rales. She exhibits no tenderness.       No wheeze or rhonchi   Abdominal: Soft. Bowel sounds are normal. She exhibits no distension, no abdominal bruit and no mass. There is no tenderness.  Musculoskeletal: Normal range of motion. She exhibits edema. She exhibits no tenderness.       basline pedal edema is a bit improved  Lymphadenopathy:    She has no cervical adenopathy.  Neurological: She is alert. She has normal reflexes. She exhibits normal muscle tone.  Skin: Skin is warm and dry. No rash noted. No erythema. No pallor.  Psychiatric: She has a normal mood and affect.          Assessment & Plan:

## 2012-07-09 NOTE — Assessment & Plan Note (Signed)
INR by central draw today Then will get set est with coumadin clinic here

## 2012-07-09 NOTE — Assessment & Plan Note (Signed)
Clinically resolved Did give flu shot today  Cbc re check from hospital Also cmet

## 2012-07-09 NOTE — Assessment & Plan Note (Signed)
a1c was 7.9 in hosp Sugars are high- will be able to inc her levamir when she is out of the donut hole in [place]  Disc diet and need for regular meals Using novolog now around meals which is helpful F/u 3 mo

## 2012-07-13 ENCOUNTER — Ambulatory Visit: Payer: Medicare Other | Admitting: Family Medicine

## 2012-07-17 ENCOUNTER — Encounter: Payer: Self-pay | Admitting: Family Medicine

## 2012-07-17 ENCOUNTER — Ambulatory Visit (INDEPENDENT_AMBULATORY_CARE_PROVIDER_SITE_OTHER): Payer: Medicare Other | Admitting: Family Medicine

## 2012-07-17 VITALS — BP 104/56 | HR 84 | Temp 97.6°F | Wt 254.8 lb

## 2012-07-17 DIAGNOSIS — E119 Type 2 diabetes mellitus without complications: Secondary | ICD-10-CM

## 2012-07-17 DIAGNOSIS — N289 Disorder of kidney and ureter, unspecified: Secondary | ICD-10-CM

## 2012-07-17 NOTE — Patient Instructions (Addendum)
Your Cr (kidney function number) is increased  This could be from diabetes or not drinking enough water or infection Please leave a urine specimen on the way out and we will call you with results  Stay away from over the counter medicines (especially anti inflammatories like ibuprofen or aleve)  If you develop urinary symptoms -like burning or blood-let me know

## 2012-07-17 NOTE — Progress Notes (Signed)
  Subjective:    Patient ID: [name], female    DOB: 08/12/1922, 78 y.o.   MRN: [id number]  HPI Here for lab f/u Has renal insufficiency    Chemistry      Component Value Date/Time   NA 142 07/09/2012 1337   K 3.5 07/09/2012 1337   CL 102 07/09/2012 1337   CO2 32 07/09/2012 1337   BUN 29* 07/09/2012 1337   CREATININE 1.5* 07/09/2012 1337      Component Value Date/Time   CALCIUM 8.5 07/09/2012 1337   ALKPHOS 41 07/09/2012 1337   AST 23 07/09/2012 1337   ALT 19 07/09/2012 1337   BILITOT 0.8 07/09/2012 1337      This cr is up  Also her bun is high  She may not drink enough water- is never "thirsty" She takes lasix 20 mg twice per day   No dysuria  Thinks she had some blood in urine  Has frequency- thought that was from lasix Often incontinent also  No fever  Glucose control is not optimal     Glucose was 157 DM is not adequaqtely controlled-will go up on levemir in [place] when out of the donut hole  Anemia is stable Lab Results  Component Value Date   WBC 6.3 07/09/2012   HGB 11.3* 07/09/2012   HCT 34.9* 07/09/2012   MCV 96.1 07/09/2012   PLT 209.0 07/09/2012      Review of Systems Review of Systems  Constitutional: Negative for fever, appetite change, fatigue and unexpected weight change.  Eyes: Negative for pain and visual disturbance.  Respiratory: Negative for cough and shortness of breath.   Cardiovascular: Negative for cp or palpitations    Gastrointestinal: Negative for nausea, diarrhea and constipation.  Genitourinary: pos for urgency and frequency. neg for flank pain Skin: Negative for pallor or rash   Neurological: Negative for weakness, light-headedness, numbness and headaches.  Hematological: Negative for adenopathy. Does not bruise/bleed easily.  Psychiatric/Behavioral: Negative for dysphoric mood. The patient is not nervous/anxious.         Objective:   Physical Exam  Constitutional: She appears well-developed and well-nourished. No distress.   HENT:  Head: Normocephalic and atraumatic.  Mouth/Throat: Oropharynx is clear and moist.  Eyes: Conjunctivae normal and EOM are normal. Pupils are equal, round, and reactive to light. No scleral icterus.  Neck: Normal range of motion. Neck supple. No thyromegaly present.  Cardiovascular: Normal rate and regular rhythm.   Pulmonary/Chest: Effort normal and breath sounds normal.  Abdominal: Soft. Bowel sounds are normal. She exhibits no distension, no abdominal bruit and no mass. There is no tenderness.  Musculoskeletal: She exhibits edema.       One plus pedal edema   No cva tenderness   Lymphadenopathy:    She has no cervical adenopathy.  Neurological: She is alert. She has normal reflexes.  Skin: Skin is warm and dry. No rash noted. No erythema. No pallor.  Psychiatric: She has a normal mood and affect.          Assessment & Plan:

## 2012-07-17 NOTE — Assessment & Plan Note (Signed)
Pt cannot afford levemir until Jan - urged to work hard on DM diet  microalb today

## 2012-07-17 NOTE — Assessment & Plan Note (Addendum)
Cr is 1.5 Recent hosp for sepsis that has resolved  Unable to take ace due to low bp from other mandatory meds Is on lasix Uncontrolled DM  Poss uti ua- pt could not give specimen - will return one when she can  Also microalbumin

## 2012-07-18 ENCOUNTER — Ambulatory Visit: Payer: Medicare Other | Admitting: Family Medicine

## 2012-07-18 DIAGNOSIS — N390 Urinary tract infection, site not specified: Secondary | ICD-10-CM

## 2012-07-18 DIAGNOSIS — J189 Pneumonia, unspecified organism: Secondary | ICD-10-CM

## 2012-07-18 DIAGNOSIS — E119 Type 2 diabetes mellitus without complications: Secondary | ICD-10-CM

## 2012-07-18 DIAGNOSIS — I4891 Unspecified atrial fibrillation: Secondary | ICD-10-CM

## 2012-07-19 LAB — POCT URINALYSIS DIPSTICK
Bilirubin, UA: NEGATIVE
Glucose, UA: NEGATIVE
Nitrite, UA: NEGATIVE

## 2012-07-19 LAB — MICROALBUMIN / CREATININE URINE RATIO: Microalb Creat Ratio: 2.7 mg/g (ref 0.0–30.0)

## 2012-07-22 LAB — URINE CULTURE: Colony Count: >=100000

## 2012-07-23 ENCOUNTER — Telehealth: Payer: Self-pay

## 2012-07-23 NOTE — Telephone Encounter (Signed)
Left voicemail requesting pt to call office, will try to call back later 

## 2012-07-23 NOTE — Telephone Encounter (Signed)
Aware of this - biggest risk is increasing her INR too much (but most antibiotics can have this potential)- I know her next INR is scheduled for 1/13 but I would like to get it done a bit earlier - 10-14 days from now is possible  I put her on the low dose of cipro due to this and also in light of kidney function , and it is most important for her kidneys that we get rid of the infection

## 2012-07-23 NOTE — Telephone Encounter (Signed)
Pt got Cipro and 9 pages of medication info came with medicine. Pt read Cipro could cause range of problems if pt took with diuretic,warfarin or antacid; pt takes all three meds. Pt took one Cipro at lunch and wants to know if Dr Milinda Antis wants to continue Cipro or change to different antibiotic. [place].Please advise.

## 2012-07-24 NOTE — Telephone Encounter (Signed)
Pt.notified

## 2012-07-24 NOTE — Telephone Encounter (Signed)
Have her take it with food - and if side effects get worse let me know , thanks

## 2012-07-24 NOTE — Telephone Encounter (Signed)
Pt notified of Dr. Royden Purl recommendations and coumadin appt and f/u appt rescheduled for 08/07/11, pt did state that she thinks that the cipro is causing her to have a very bad upset stomach and nausea

## 2012-07-27 ENCOUNTER — Telehealth: Payer: Self-pay

## 2012-07-27 NOTE — Telephone Encounter (Signed)
Echo faxed to [facility]

## 2012-07-27 NOTE — Telephone Encounter (Signed)
Please send copy of most recent echo, thanks

## 2012-07-27 NOTE — Telephone Encounter (Signed)
Lauren nurse case manager with [facility] left v/m requesting recent 2D echo and note stating pt has diagnosis of heart failure.Please advise.Pt is in [facility] Heart failure program.

## 2012-08-06 ENCOUNTER — Ambulatory Visit: Payer: Medicare Other | Admitting: Family Medicine

## 2012-08-06 ENCOUNTER — Ambulatory Visit: Payer: Medicare Other

## 2012-08-13 ENCOUNTER — Ambulatory Visit: Payer: Medicare Other

## 2012-08-13 ENCOUNTER — Ambulatory Visit: Payer: Medicare Other | Admitting: Family Medicine

## 2012-08-14 ENCOUNTER — Ambulatory Visit: Payer: Medicare Other

## 2012-08-15 ENCOUNTER — Ambulatory Visit (INDEPENDENT_AMBULATORY_CARE_PROVIDER_SITE_OTHER): Payer: Medicare Other | Admitting: General Practice

## 2012-08-15 ENCOUNTER — Ambulatory Visit (INDEPENDENT_AMBULATORY_CARE_PROVIDER_SITE_OTHER): Payer: Medicare Other | Admitting: Family Medicine

## 2012-08-15 ENCOUNTER — Encounter: Payer: Self-pay | Admitting: Family Medicine

## 2012-08-15 VITALS — BP 132/80 | HR 81 | Temp 97.4°F

## 2012-08-15 DIAGNOSIS — N390 Urinary tract infection, site not specified: Secondary | ICD-10-CM

## 2012-08-15 DIAGNOSIS — I4891 Unspecified atrial fibrillation: Secondary | ICD-10-CM

## 2012-08-15 DIAGNOSIS — N289 Disorder of kidney and ureter, unspecified: Secondary | ICD-10-CM

## 2012-08-15 LAB — POCT UA - MICROSCOPIC ONLY: RBC, urine, microscopic: 0

## 2012-08-15 LAB — PROTIME-INR
INR: 3.4 ratio — ABNORMAL HIGH (ref 0.8–1.0)
Prothrombin Time: 35.4 s — ABNORMAL HIGH (ref 10.2–12.4)

## 2012-08-15 LAB — POCT URINALYSIS DIPSTICK
Blood, UA: NEGATIVE
Nitrite, UA: NEGATIVE
Urobilinogen, UA: 0.2
pH, UA: 6

## 2012-08-15 LAB — RENAL FUNCTION PANEL
Albumin: 3.4 g/dL — ABNORMAL LOW (ref 3.5–5.2)
BUN: 24 mg/dL — ABNORMAL HIGH (ref 6–23)
Calcium: 8.9 mg/dL (ref 8.4–10.5)
Chloride: 104 mEq/L (ref 96–112)
Glucose, Bld: 311 mg/dL — ABNORMAL HIGH (ref 70–99)
Phosphorus: 3.4 mg/dL (ref 2.3–4.6)

## 2012-08-15 NOTE — Progress Notes (Signed)
Subjective:    Patient ID: [name], female    DOB: 1923/03/25, 79 y.o.   MRN: [id number]  HPI Here for f/u of uti and elevated cr Was dx with enterobacter uti and tx with cipro for 1 week Lab Results  Component Value Date   CREATININE 1.5* 07/09/2012    Will re check ua and lab today  No voiding symptoms except for baseline frequency and incontinence   Took cipro   Has been itching all over  Also rash  ? If allergic to abx  cipro did make her nauseated   She does take asa daily   Patient Active Problem List  Diagnosis  . ADENOMATOUS COLONIC POLYP  . DIABETES MELLITUS, TYPE II  . HYPERLIPIDEMIA  . HYPERTENSION  . Atrial fibrillation  . CHRONIC RHINITIS  . GERD  . HIATAL HERNIA  . IRRITABLE BOWEL SYNDROME  . ROSACEA  . INTERTRIGO  . OSTEOPOROSIS  . UNSPECIFIED VITAMIN D DEFICIENCY  . Lump in the abdomen  . Fatigue  . B12 deficiency  . Dysphagia  . Generalized weakness  . Influenza B  . Benign positional vertigo  . Bronchitis with flu  . Pain, chronic  . Frequent falls  . DM type 2 with diabetic peripheral neuropathy  . URI (upper respiratory infection)  . Metabolic encephalopathy  . Atrial fibrillation with RVR  . CAP (community acquired pneumonia)  . Anemia  . Renal insufficiency   Past Medical History  Diagnosis Date  . Atrial fibrillation   . Diabetes mellitus     type II  . Anemia     Fe deficiency  . GERD (gastroesophageal reflux disease)   . Hyperlipidemia   . Hypertension   . Hypothyroid   . Obesity   . Vaginal dysplasia 1980  . Rosacea   . Chest pain 11/2002    cardiac cath neg   . Back pain   . IBS (irritable bowel syndrome)     with diarrhea  . Asthma     as a child  . Adenomatous colon polyp 12/2007  . Hemorrhoids   . Vertigo   . Hiatal hernia 12/2007    EGD   Past Surgical History  Procedure Date  . Abdominal hysterectomy 1963  . Cholecystectomy 2006  . Knee surgery     Rt TKR 1993 & Lt TKR 2001  . Cervical  cone biopsy 1963    D&C  . Appendectomy 1941  . Cystocele repair 1978    /rectocele  . Orif ankle fracture 01/2010    fall/followed by rehab stay  . Cataract extraction 2006    bilateral   History  Substance Use Topics  . Smoking status: Former Games developer  . Smokeless tobacco: Never Used  . Alcohol Use: No   Family History  Problem Relation Age of Onset  . Cancer Mother     leukemia  . Coronary artery disease Mother   . Dementia Mother   . Heart disease Father     CAD  . COPD Sister   . Heart disease Brother   . Diabetes Brother   . Kidney disease Son   . Colon cancer Brother    Allergies  Allergen Reactions  . Insulin Glargine     Itching   . Latex     Rash   . Metformin And Related     Severe diarrhea   . Metoclopramide Hcl     REACTION: unspecified  . Nsaids     REACTION: unspecified  .  Promethazine Hcl     REACTION: unspecified   Current Outpatient Prescriptions on File Prior to Visit  Medication Sig Dispense Refill  . furosemide (LASIX) 20 MG tablet Take 1 tablet (20 mg total) by mouth 2 (two) times daily.  60 tablet  0  . glucose blood test strip Use as instructed  100 each  12  . insulin aspart (NOVOLOG FLEXPEN) 100 UNIT/ML injection Inject 6 Units into the skin 3 (three) times daily before meals.  1 vial  1  . insulin detemir (LEVEMIR) 100 UNIT/ML injection Inject 60 Units into the skin every morning.      . Insulin Pen Needle (B-D UF III MINI PEN NEEDLES) 31G X 5 MM MISC Use as directed for insulin pen.  100 each  3  . levothyroxine (SYNTHROID, LEVOTHROID) 150 MCG tablet Take 150 mcg by mouth daily.      . methocarbamol (ROBAXIN) 500 MG tablet Take 1 tablet (500 mg total) by mouth 3 (three) times daily as needed. For muscle spasm  270 tablet  3  . metoprolol (LOPRESSOR) 50 MG tablet Take 0.5 tablets (25 mg total) by mouth 2 (two) times daily.  90 tablet  3  . omeprazole (PRILOSEC) 20 MG capsule Take 20 mg by mouth daily.      . potassium chloride SA  (K-DUR,KLOR-CON) 20 MEQ tablet Take 20 mEq by mouth daily.       . simvastatin (ZOCOR) 40 MG tablet Take 1 tablet (40 mg total) by mouth at bedtime.  90 tablet  3  . traMADol (ULTRAM) 50 MG tablet Take 100 mg by mouth 3 (three) times daily as needed. pain      . warfarin (COUMADIN) 5 MG tablet Take 2.5 mg by mouth as directed.            Review of Systems Review of Systems  Constitutional: Negative for fever, appetite change, fatigue and unexpected weight change.  Eyes: Negative for pain and visual disturbance.  Respiratory: Negative for cough and shortness of breath.   Cardiovascular: Negative for cp or palpitations    Gastrointestinal: Negative for nausea, diarrhea and constipation.  Genitourinary: Negative for urgency and frequency.  Skin: Negative for pallor or rash  pos for itching all over  MK pos for chronic pain and decreased mobility Neurological: Negative for weakness, light-headedness, numbness and headaches.  Hematological: Negative for adenopathy. Does not bruise/bleed easily.  Psychiatric/Behavioral: Negative for dysphoric mood. The patient is not nervous/anxious.         Objective:   Physical Exam  Constitutional: She appears well-developed and well-nourished. No distress.       obese and well appearing  In wheelchair  HENT:  Head: Normocephalic and atraumatic.  Mouth/Throat: Oropharynx is clear and moist.  Eyes: Conjunctivae normal and EOM are normal. Pupils are equal, round, and reactive to light. No scleral icterus.  Neck: Normal range of motion. Neck supple. No JVD present.  Cardiovascular: Normal rate and regular rhythm.   Pulmonary/Chest: Effort normal and breath sounds normal.  Abdominal: Soft. Bowel sounds are normal. She exhibits no distension. There is no tenderness.  Musculoskeletal: She exhibits edema.       Poor rom most joints  No cva tenderness Pedal edema 1 plus baseline  Lymphadenopathy:    She has no cervical adenopathy.  Neurological: She  is alert.  Skin: Skin is warm and dry. No rash noted.       No rash or excoriations seen  Psychiatric: She has a normal  mood and affect.          Assessment & Plan:

## 2012-08-15 NOTE — Patient Instructions (Addendum)
Drink more water- as much as you can tolerate to help your kidney function  Urine test looks clear so far  Labs today for kidney function- hoping this will be much improved

## 2012-08-16 ENCOUNTER — Ambulatory Visit: Payer: Medicare Other

## 2012-08-18 LAB — URINE CULTURE

## 2012-08-19 ENCOUNTER — Telehealth: Payer: Self-pay | Admitting: Family Medicine

## 2012-08-19 MED ORDER — NITROFURANTOIN MONOHYD MACRO 100 MG PO CAPS: 100.0000 mg | ORAL_CAPSULE | Freq: Two times a day (BID) | ORAL | Status: DC

## 2012-08-19 NOTE — Telephone Encounter (Signed)
Urine cx shows there is still infection Please call in abx - macrobid  Need to re cx urine in 2 weeks again please - can just have lab visit for that

## 2012-08-20 NOTE — Telephone Encounter (Signed)
Pt notified of lab results and nurse appt scheduled for 09/04/12, and Rx called in as prescribed

## 2012-08-21 ENCOUNTER — Telehealth: Payer: Self-pay | Admitting: *Deleted

## 2012-08-21 NOTE — Telephone Encounter (Signed)
She has had itching for a while / even before antibiotic- I do  Not think the macrobid is causing it If she has a Rash-- please let me know  Otherwise I recommend benadryl otc 25-50 mg up to every 4 hours as needed for itching (warn her this may sedate)

## 2012-08-21 NOTE — Telephone Encounter (Signed)
Pt.notified

## 2012-08-21 NOTE — Telephone Encounter (Signed)
Pt states she picked up script yesterday, macrobid.  States she was already having itching from something, but when she started the antibiotic the itching became much worse, different.  She's asking what to do, asking if she can have something for the itching.  This is keeping her awake at night- as the itching doesn't bother her much during the day, but starts in the evenings.  Please advise on what she should do.  [facility]

## 2012-08-23 ENCOUNTER — Encounter: Payer: Medicare Other | Admitting: Cardiology

## 2012-08-23 ENCOUNTER — Ambulatory Visit: Payer: Medicare Other

## 2012-08-27 ENCOUNTER — Ambulatory Visit (INDEPENDENT_AMBULATORY_CARE_PROVIDER_SITE_OTHER): Payer: Medicare Other | Admitting: General Practice

## 2012-08-27 ENCOUNTER — Other Ambulatory Visit (INDEPENDENT_AMBULATORY_CARE_PROVIDER_SITE_OTHER): Payer: Medicare Other | Admitting: *Deleted

## 2012-08-27 ENCOUNTER — Ambulatory Visit: Payer: Medicare Other

## 2012-08-27 DIAGNOSIS — Z7901 Long term (current) use of anticoagulants: Secondary | ICD-10-CM

## 2012-08-27 DIAGNOSIS — I4891 Unspecified atrial fibrillation: Secondary | ICD-10-CM

## 2012-08-27 DIAGNOSIS — Z5181 Encounter for therapeutic drug level monitoring: Secondary | ICD-10-CM

## 2012-08-27 DIAGNOSIS — N390 Urinary tract infection, site not specified: Secondary | ICD-10-CM

## 2012-08-27 LAB — POCT URINALYSIS DIPSTICK
Glucose, UA: NEGATIVE
Nitrite, UA: NEGATIVE
Urobilinogen, UA: 0.2

## 2012-08-27 LAB — POCT INR: INR: 2.4

## 2012-08-30 ENCOUNTER — Telehealth: Payer: Self-pay

## 2012-08-30 ENCOUNTER — Encounter: Payer: Self-pay | Admitting: Cardiology

## 2012-08-30 NOTE — Telephone Encounter (Signed)
That can be cancelled

## 2012-08-30 NOTE — Telephone Encounter (Signed)
Pt said she has scheduled a lab appt on 09/04/12 for urinalysis. Pt said just did U/A on 08/27/12 and was notified OK. Pt wants to know if needs to keep 09/04/12 lab appt for U/A or can that be cancelled.Please advise.

## 2012-08-31 ENCOUNTER — Telehealth: Payer: Self-pay | Admitting: *Deleted

## 2012-08-31 NOTE — Telephone Encounter (Signed)
Pt notified and appt cx

## 2012-08-31 NOTE — Telephone Encounter (Signed)
She can put it out to late April

## 2012-08-31 NOTE — Telephone Encounter (Signed)
Pt wanted to know if she has to keep the 10/08/12 f/u appt with you since she was just here and then had a U/A done that was clear

## 2012-08-31 NOTE — Telephone Encounter (Signed)
appt rescheduled for 11/26/12

## 2012-09-04 ENCOUNTER — Other Ambulatory Visit: Payer: Medicare Other

## 2012-09-04 ENCOUNTER — Other Ambulatory Visit: Payer: Self-pay | Admitting: Surgery

## 2012-09-04 ENCOUNTER — Telehealth: Payer: Self-pay

## 2012-09-04 NOTE — Telephone Encounter (Signed)
Opened in error

## 2012-09-07 ENCOUNTER — Other Ambulatory Visit: Payer: Self-pay | Admitting: *Deleted

## 2012-09-07 MED ORDER — POTASSIUM CHLORIDE CRYS ER 20 MEQ PO TBCR: 20.0000 meq | EXTENDED_RELEASE_TABLET | Freq: Every day | ORAL | Status: DC

## 2012-09-07 MED ORDER — LEVOTHYROXINE SODIUM 150 MCG PO TABS: 150.0000 ug | ORAL_TABLET | Freq: Every day | ORAL | Status: DC

## 2012-09-07 MED ORDER — METOPROLOL TARTRATE 50 MG PO TABS: 25.0000 mg | ORAL_TABLET | Freq: Two times a day (BID) | ORAL | Status: DC

## 2012-09-07 MED ORDER — OMEPRAZOLE 20 MG PO CPDR: 20.0000 mg | DELAYED_RELEASE_CAPSULE | Freq: Every day | ORAL | Status: DC

## 2012-09-07 NOTE — Addendum Note (Signed)
Addended by: Baldomero Lamy on: 09/07/2012 10:08 AM   Modules accepted: Orders

## 2012-09-11 ENCOUNTER — Encounter: Payer: Medicare Other | Admitting: Cardiology

## 2012-09-24 ENCOUNTER — Ambulatory Visit: Payer: Medicare Other

## 2012-09-27 ENCOUNTER — Encounter: Payer: Self-pay | Admitting: Cardiology

## 2012-09-27 ENCOUNTER — Ambulatory Visit (INDEPENDENT_AMBULATORY_CARE_PROVIDER_SITE_OTHER): Payer: Medicare Other | Admitting: Cardiology

## 2012-09-27 ENCOUNTER — Ambulatory Visit (INDEPENDENT_AMBULATORY_CARE_PROVIDER_SITE_OTHER): Payer: Medicare Other | Admitting: General Practice

## 2012-09-27 VITALS — BP 130/78 | HR 93 | Ht 62.0 in | Wt 249.0 lb

## 2012-09-27 DIAGNOSIS — E1149 Type 2 diabetes mellitus with other diabetic neurological complication: Secondary | ICD-10-CM

## 2012-09-27 DIAGNOSIS — Z7901 Long term (current) use of anticoagulants: Secondary | ICD-10-CM

## 2012-09-27 DIAGNOSIS — E1142 Type 2 diabetes mellitus with diabetic polyneuropathy: Secondary | ICD-10-CM

## 2012-09-27 DIAGNOSIS — Z5181 Encounter for therapeutic drug level monitoring: Secondary | ICD-10-CM

## 2012-09-27 DIAGNOSIS — I4891 Unspecified atrial fibrillation: Secondary | ICD-10-CM

## 2012-09-27 DIAGNOSIS — I10 Essential (primary) hypertension: Secondary | ICD-10-CM

## 2012-09-27 LAB — POCT INR: INR: 1.5

## 2012-09-27 NOTE — Progress Notes (Signed)
Colbi Schiltz Garriga Date of Birth: Apr 13, 1923 Medical Record #[id number]  History of Present Illness: Mrs. [name] is seen today to establish cardiac care. She is an 79 year old white female previously followed by Dr. Deborah Chalk. She has a history of permanent atrial fibrillation that has been managed with rate control and anticoagulation with Coumadin. She had a normal cardiac catheterization in 2004. She had an echocardiogram as recently as November of 2013 that showed normal LV function. Her activity is very limited due to to her morbid obesity and peripheral neuropathy. She either uses a walker or wheelchair. She has some shortness of breath but this has been a chronic complaint and is unchanged. She denies any chest pain, dizziness, or syncope. She does have some chronic edema managed with furosemide and stockings.  Current Outpatient Prescriptions on File Prior to Visit  Medication Sig Dispense Refill  . furosemide (LASIX) 20 MG tablet Take 1 tablet (20 mg total) by mouth 2 (two) times daily.  60 tablet  0  . glucose blood test strip Use as instructed  100 each  12  . insulin aspart (NOVOLOG FLEXPEN) 100 UNIT/ML injection Inject 6 Units into the skin 3 (three) times daily before meals.  1 vial  1  . insulin detemir (LEVEMIR) 100 UNIT/ML injection Inject 60 Units into the skin every morning.      . Insulin Pen Needle (B-D UF III MINI PEN NEEDLES) 31G X 5 MM MISC Use as directed for insulin pen.  100 each  3  . levothyroxine (SYNTHROID, LEVOTHROID) 150 MCG tablet Take 1 tablet (150 mcg total) by mouth daily.  90 tablet  1  . metoprolol (LOPRESSOR) 50 MG tablet Take 0.5 tablets (25 mg total) by mouth 2 (two) times daily.  90 tablet  1  . omeprazole (PRILOSEC) 20 MG capsule Take 1 capsule (20 mg total) by mouth daily.  90 capsule  1  . potassium chloride SA (K-DUR,KLOR-CON) 20 MEQ tablet Take 1 tablet (20 mEq total) by mouth daily.  90 tablet  1  . simvastatin (ZOCOR) 40 MG tablet Take 1 tablet (40  mg total) by mouth at bedtime.  90 tablet  3  . traMADol (ULTRAM) 50 MG tablet Take 100 mg by mouth 3 (three) times daily as needed. pain      . warfarin (COUMADIN) 5 MG tablet Take 2.5 mg by mouth as directed.        No current facility-administered medications on file prior to visit.    Allergies  Allergen Reactions  . Ciprofloxacin     Nausea Possibly rash and itching- but not sure   . Insulin Glargine     Itching   . Latex     Rash   . Metformin And Related     Severe diarrhea   . Metoclopramide Hcl     REACTION: unspecified  . Nsaids     REACTION: unspecified  . Promethazine Hcl     REACTION: unspecified    Past Medical History  Diagnosis Date  . Atrial fibrillation   . Diabetes mellitus     type II  . Anemia     Fe deficiency  . GERD (gastroesophageal reflux disease)   . Hyperlipidemia   . Hypertension   . Hypothyroid   . Obesity   . Vaginal dysplasia 1980  . Rosacea   . Chest pain 11/2002    cardiac cath neg   . Back pain   . IBS (irritable bowel syndrome)  with diarrhea  . Asthma     as a child  . Adenomatous colon polyp 12/2007  . Hemorrhoids   . Vertigo   . Hiatal hernia 12/2007    EGD    Past Surgical History  Procedure Laterality Date  . Abdominal hysterectomy  1963  . Cholecystectomy  2006  . Knee surgery      Rt TKR 1993 & Lt TKR 2001  . Cervical cone biopsy  1963    D&C  . Appendectomy  1941  . Cystocele repair  1978    /rectocele  . Orif ankle fracture  01/2010    fall/followed by rehab stay  . Cataract extraction  2006    bilateral    History  Smoking status  . Former Smoker  Smokeless tobacco  . Never Used    History  Alcohol Use No    Family History  Problem Relation Age of Onset  . Cancer Mother     leukemia  . Coronary artery disease Mother   . Dementia Mother   . Heart disease Father     CAD  . COPD Sister   . Heart disease Brother   . Diabetes Brother   . Kidney disease Son   . Colon cancer  Brother     Review of Systems: As noted in history of present illness.  All other systems were reviewed and are negative.  Physical Exam: BP 130/78  Pulse 93  Ht 5\' 2"  (1.575 m)  Wt 249 lb (112.946 kg)  BMI 45.53 kg/m2  SpO2 96%  LMP 09/01/1961 She is a pleasant, elderly white female seen in a wheelchair. HEENT: Normocephalic, atraumatic. Pupils equal round and reactive to light and accommodation. Oropharynx is clear. Neck is supple without jugular venous distention or bruits. There is no adenopathy or thyromegaly. Lungs: Clear Cardiovascular: Irregular rate and rhythm without gallop, murmur, or click. Abdomen: Obese, soft, no masses or bruits bowel sounds positive. Extremities: Chronic 1+ edema. Old stasis changes. Skin: Warm and dry Neuro: Alert and oriented x3. Cranial nerves II through XII are intact. LABORATORY DATA: Lab Results  Component Value Date   WBC 6.3 07/09/2012   HGB 11.3* 07/09/2012   HCT 34.9* 07/09/2012   PLT 209.0 07/09/2012   GLUCOSE 311* 08/15/2012   CHOL 152 01/21/2011   TRIG 122.0 01/21/2011   HDL 36.90* 01/21/2011   LDLDIRECT 160.8 09/22/2010   LDLCALC 91 01/21/2011   ALT 19 07/09/2012   AST 23 07/09/2012   NA 137 08/15/2012   K 3.9 08/15/2012   CL 104 08/15/2012   CREATININE 1.3* 08/15/2012   BUN 24* 08/15/2012   CO2 26 08/15/2012   TSH 1.615 06/15/2012   INR 1.5 09/27/2012   HGBA1C 7.9* 06/15/2012   MICROALBUR 2.6* 07/19/2012   ECG demonstrates atrial fibrillation with a ventricular response of 86 beats per minute. It is otherwise normal.  Transthoracic Echocardiography  Patient: Shantea, Poulton MR #: [id number] Study Date: 06/18/2012 Gender: F Age: 68 Height: 170.2cm Weight: 111.5kg BSA: 2.100m^2 Pt. Status: Room: 1229  PERFORMING [facility] Cardiology, Ec ATTENDING [place], [place], [place] ADMITTING Radonna Ricker Rosine Beat SONOGRAPHER Melissa Morford, RDCS cc:  ------------------------------------------------------------ LV EF: 60% -  65%  ------------------------------------------------------------ Indications: Sepsis 038.9Septic shock 785.59.  ------------------------------------------------------------ History: PMH: Atrial fibrillation. Risk factors: Hypertension. Diabetes mellitus.  ------------------------------------------------------------ Study Conclusions  - Left ventricle: The cavity size was normal. Systolic function was normal. The estimated ejection fraction was in the range of 60% to 65%. Although no diagnostic  regional wall motion abnormality was identified, this possibility cannot be completely excluded on the basis of this study. - Aortic valve: Valve mobility was restricted. There was mild stenosis by velocities. Leaflet excursion appears more limited in some other views. Valve area: 1.46cm^2(VTI). Valve area: 1.53cm^2 (Vmax). - Mitral valve: Calcified annulus. - Right ventricle: The cavity size was mildly dilated. - Right atrium: The atrium was moderately dilated. - Pulmonary arteries: Systolic pressure was moderately increased. PA peak pressure: 54mm Hg (S). Transthoracic echocardiography. M-mode, complete 2D, spectral Doppler, and color Doppler. Height: Height: 170.2cm. Height: 67in. Weight: Weight: 111.5kg. Weight: 245.3lb. Body mass index: BMI: 38.5kg/m^2. Body surface area: BSA: 2.63m^2. Blood pressure: 142/57. Patient status: Inpatient. Location: ICU/CCU  ------------------------------------------------------------  ------------------------------------------------------------ Left ventricle: The cavity size was normal. Systolic function was normal. The estimated ejection fraction was in the range of 60% to 65%. Although no diagnostic regional wall motion abnormality was identified, this possibility cannot be completely excluded on the basis of this study.  ------------------------------------------------------------ Aortic valve: Trileaflet; moderately calcified leaflets. Valve  mobility was restricted. Doppler: There was mild stenosis by velocities. Leaflet excursion appears more limited in some other views. VTI ratio of LVOT to aortic valve: 0.47. Valve area: 1.46cm^2(VTI). Indexed valve area: 0.62cm^2/m^2 (VTI). Peak velocity ratio of LVOT to aortic valve: 0.49. Valve area: 1.53cm^2 (Vmax). Indexed valve area: 0.65cm^2/m^2 (Vmax). Mean gradient: 5mm Hg (S).  ------------------------------------------------------------ Aorta: Aortic root: The aortic root was normal in size.  ------------------------------------------------------------ Mitral valve: Calcified annulus. Doppler: Trivial regurgitation. Peak gradient: 3mm Hg (D).  ------------------------------------------------------------ Left atrium: The atrium was at the upper limits of normal in size.  ------------------------------------------------------------ Atrial septum: Poorly visualized.  ------------------------------------------------------------ Right ventricle: The cavity size was mildly dilated. Systolic function was normal.  ------------------------------------------------------------ Pulmonic valve: Poorly visualized.  ------------------------------------------------------------ Tricuspid valve: Mildly thickened leaflets. Doppler: Mild regurgitation.  ------------------------------------------------------------ Pulmonary artery: Poorly visualized. Systolic pressure was moderately increased.  ------------------------------------------------------------ Right atrium: The atrium was moderately dilated.  ------------------------------------------------------------ Pericardium: There was no pericardial effusion.  ------------------------------------------------------------ Systemic veins: Inferior vena cava: The vessel was normal in size; the respirophasic diameter changes were in the normal range (= 50%); findings are consistent with normal central  venous pressure.  ------------------------------------------------------------  2D measurements Normal Doppler measurements Normal Left ventricle Main pulmonary LVID ED, 46 mm 43-52 artery chord, Pressure, 54 mm Hg =30 PLAX S LVID ES, 36 mm 23-38 Left ventricle chord, Ea, lat 7.51 cm/s ------ PLAX ann, tiss FS, chord, 22 % >29 DP PLAX E/Ea, lat 12.0 ------ LVPW, ED 11 mm ------ ann, tiss 9 IVS/LVPW 0.91 <1.3 DP ratio, ED Ea, med 6.82 cm/s ------ Ventricular septum ann, tiss IVS, ED 10 mm ------ DP LVOT E/Ea, med 13.3 ------ Diam, S 20 mm ------ ann, tiss 1 Area 3.14 cm^2 ------ DP Aorta LVOT Root diam, 32 mm ------ Peak vel, 75.2 cm/s ------ ED S Left atrium VTI, S 14.3 cm ------ AP dim 37 mm ------ Aortic valve AP dim 1.58 cm/m^2 <2.2 Peak vel, 154 cm/s ------ index S Mean vel, 99.8 cm/s ------ S VTI, S 30.7 cm ------ Mean 5 mm Hg ------ gradient, S VTI ratio 0.47 ------ LVOT/AV Area, VTI 1.46 cm^2 ------ Area index 0.62 cm^2/m ------ (VTI) ^2 Peak vel 0.49 ------ ratio, LVOT/AV Area, Vmax 1.53 cm^2 ------ Area index 0.65 cm^2/m ------ (Vmax) ^2 Mitral valve Peak E vel 90.8 cm/s ------ Decelerati 257 ms 150-23 on time 0 Peak 3 mm Hg ------ gradient, D Tricuspid valve Regurg 330 cm/s ------ peak vel Peak  RV-RA 44 mm Hg ------ gradient, S Systemic veins Estimated 10 mm Hg ------ CVP Right ventricle Pressure, 54 mm Hg <30 S Sa vel, 7.51 cm/s ------ lat ann, tiss DP  ------------------------------------------------------------ Prepared and Electronically Authenticated by  Everette Rank 2013-11-19T08:[id number]   Assessment / Plan:  1. Atrial fibrillation-per minute. Rate is well controlled on metoprolol. She is on appropriate anticoagulation with Coumadin. We did discuss the novel anticoagulant agents but she has chronic kidney disease. Given this fact and her advanced age I think she is best being managed with Coumadin. I'll followup again  in one year.  2. Morbid obesity.  3. Diabetes mellitus with peripheral neuropathy.  4. Chronic kidney disease stage III.

## 2012-09-27 NOTE — Patient Instructions (Signed)
Continue your current therapy.  I will see back in one year.

## 2012-10-08 ENCOUNTER — Ambulatory Visit: Payer: Medicare Other | Admitting: Family Medicine

## 2012-10-16 ENCOUNTER — Ambulatory Visit (INDEPENDENT_AMBULATORY_CARE_PROVIDER_SITE_OTHER): Payer: Medicare Other | Admitting: Family Medicine

## 2012-10-16 ENCOUNTER — Encounter: Payer: Self-pay | Admitting: Family Medicine

## 2012-10-16 VITALS — BP 110/62 | HR 87 | Temp 97.6°F | Ht 62.0 in

## 2012-10-16 DIAGNOSIS — R220 Localized swelling, mass and lump, head: Secondary | ICD-10-CM

## 2012-10-16 DIAGNOSIS — H9202 Otalgia, left ear: Secondary | ICD-10-CM

## 2012-10-16 DIAGNOSIS — H9209 Otalgia, unspecified ear: Secondary | ICD-10-CM

## 2012-10-16 NOTE — Patient Instructions (Addendum)
Your ear looks fine -no infection or wax build up or other finding- but I realize your hearing problem is worsening  If you would like to see a new ear doctor-please let me know - and I will refer you  For the facial (temple area) fullness- I am going to do a lab test called a sed rate - we will notify you with a result If you develop a fever or headache- let me know

## 2012-10-16 NOTE — Assessment & Plan Note (Signed)
Subjective fullness in L temple with feeling of ear discomfort-no headache or tenderness Check ESR for temporal arteritis and update  Asked her to call if ha/ fever or vision change develop  Is thinking about ENT ref

## 2012-10-16 NOTE — Assessment & Plan Note (Signed)
No finding on exam besides decreased hearing  In light of ? Temporal swelling (unsure if this is new) check ESR and update  Did offer to set her up with new ENT and she will think about that

## 2012-10-16 NOTE — Progress Notes (Signed)
Subjective:    Patient ID: [name], female    DOB: 05-Jun-1923, 79 y.o.   MRN: [id number]  HPI Here with ear problems - she has difficulty hearing out of it for the past 6-7 weeks (Left ear) Just not getting better  Her son thinks that the L side of her face is swollen (pt agrees mildly)- around temples  No new dental problems  Ear does not hurt- it just feels more swollen than anything else   Wondered if her hearing aid was irritating it  This has made her itch - ? If possible latex in them   Some ringing in both ears  No cold symptoms but sinuses always stay full  No ear drainage   No jaw pain/ clicking or popping -no hx of TMJ  occ headache-not often  Patient Active Problem List  Diagnosis  . ADENOMATOUS COLONIC POLYP  . DIABETES MELLITUS, TYPE II  . HYPERLIPIDEMIA  . HYPERTENSION  . Atrial fibrillation  . CHRONIC RHINITIS  . GERD  . HIATAL HERNIA  . IRRITABLE BOWEL SYNDROME  . ROSACEA  . INTERTRIGO  . OSTEOPOROSIS  . UNSPECIFIED VITAMIN D DEFICIENCY  . Lump in the abdomen  . Fatigue  . B12 deficiency  . Dysphagia  . Generalized weakness  . Influenza B  . Benign positional vertigo  . Bronchitis with flu  . Pain, chronic  . Frequent falls  . DM type 2 with diabetic peripheral neuropathy  . URI (upper respiratory infection)  . Metabolic encephalopathy  . Atrial fibrillation with RVR  . CAP (community acquired pneumonia)  . Anemia  . Renal insufficiency  . UTI (lower urinary tract infection)   Past Medical History  Diagnosis Date  . Atrial fibrillation   . Diabetes mellitus     type II  . Anemia     Fe deficiency  . GERD (gastroesophageal reflux disease)   . Hyperlipidemia   . Hypertension   . Hypothyroid   . Obesity   . Vaginal dysplasia 1980  . Rosacea   . Chest pain 11/2002    cardiac cath neg   . Back pain   . IBS (irritable bowel syndrome)     with diarrhea  . Asthma     as a child  . Adenomatous colon polyp 12/2007  .  Hemorrhoids   . Vertigo   . Hiatal hernia 12/2007    EGD   Past Surgical History  Procedure Laterality Date  . Abdominal hysterectomy  1963  . Cholecystectomy  2006  . Knee surgery      Rt TKR 1993 & Lt TKR 2001  . Cervical cone biopsy  1963    D&C  . Appendectomy  1941  . Cystocele repair  1978    /rectocele  . Orif ankle fracture  01/2010    fall/followed by rehab stay  . Cataract extraction  2006    bilateral   History  Substance Use Topics  . Smoking status: Former Games developer  . Smokeless tobacco: Never Used  . Alcohol Use: No   Family History  Problem Relation Age of Onset  . Cancer Mother     leukemia  . Coronary artery disease Mother   . Dementia Mother   . Heart disease Father     CAD  . COPD Sister   . Heart disease Brother   . Diabetes Brother   . Kidney disease Son   . Colon cancer Brother    Allergies  Allergen Reactions  .  Ciprofloxacin     Nausea Possibly rash and itching- but not sure   . Insulin Glargine     Itching   . Latex     Rash   . Metformin And Related     Severe diarrhea   . Metoclopramide Hcl     REACTION: unspecified  . Nsaids     REACTION: unspecified  . Promethazine Hcl     REACTION: unspecified   Current Outpatient Prescriptions on File Prior to Visit  Medication Sig Dispense Refill  . furosemide (LASIX) 20 MG tablet Take 1 tablet (20 mg total) by mouth 2 (two) times daily.  60 tablet  0  . glucose blood test strip Use as instructed  100 each  12  . insulin aspart (NOVOLOG FLEXPEN) 100 UNIT/ML injection Inject 6 Units into the skin 3 (three) times daily before meals.  1 vial  1  . insulin detemir (LEVEMIR) 100 UNIT/ML injection Inject 60 Units into the skin every morning.      . Insulin Pen Needle (B-D UF III MINI PEN NEEDLES) 31G X 5 MM MISC Use as directed for insulin pen.  100 each  3  . levothyroxine (SYNTHROID, LEVOTHROID) 150 MCG tablet Take 1 tablet (150 mcg total) by mouth daily.  90 tablet  1  . metoprolol  (LOPRESSOR) 50 MG tablet Take 0.5 tablets (25 mg total) by mouth 2 (two) times daily.  90 tablet  1  . omeprazole (PRILOSEC) 20 MG capsule Take 1 capsule (20 mg total) by mouth daily.  90 capsule  1  . potassium chloride SA (K-DUR,KLOR-CON) 20 MEQ tablet Take 1 tablet (20 mEq total) by mouth daily.  90 tablet  1  . simvastatin (ZOCOR) 40 MG tablet Take 1 tablet (40 mg total) by mouth at bedtime.  90 tablet  3  . traMADol (ULTRAM) 50 MG tablet Take 100 mg by mouth 3 (three) times daily as needed. pain      . warfarin (COUMADIN) 5 MG tablet Take 2.5 mg by mouth as directed.        No current facility-administered medications on file prior to visit.      Review of Systems Review of Systems  Constitutional: Negative for fever, appetite change,  and unexpected weight change. pos for chronic fatigue ENT pos for ear discomfort/ pressure , neg for st or ear drainage Eyes: Negative for pain and visual disturbance. pos for chronic ankle edema  Respiratory: Negative for cough and shortness of breath.   Cardiovascular: Negative for cp or palpitations    Gastrointestinal: Negative for nausea, and constipation. pos for intermittent chronic diarrhea with IBS Genitourinary: Negative for urgency and frequency.  Skin: Negative for pallor or rash   Neurological: Negative for weakness, light-headedness, numbness and headaches.  Hematological: Negative for adenopathy. Does not bruise/bleed easily.  Psychiatric/Behavioral: Negative for dysphoric mood. The patient is not nervous/anxious.         Objective:   Physical Exam  Constitutional: She appears well-developed and well-nourished. No distress.  Obese elderly female in wheelchair  HENT:  Head: Normocephalic and atraumatic. Head is without contusion.  Right Ear: External ear normal. Tympanic membrane is scarred. Tympanic membrane is not erythematous, not retracted and not bulging. Decreased hearing is noted.  Left Ear: External ear normal. No drainage,  swelling or tenderness. No mastoid tenderness. Tympanic membrane is scarred. Tympanic membrane is not injected, not erythematous, not retracted and not bulging.  No middle ear effusion. Decreased hearing is noted.  Nose: Mucosal  edema present. No rhinorrhea. Right sinus exhibits no maxillary sinus tenderness and no frontal sinus tenderness. Left sinus exhibits no maxillary sinus tenderness and no frontal sinus tenderness.  Mouth/Throat: Oropharynx is clear and moist and mucous membranes are normal. No dental abscesses. No oropharyngeal exudate.  No TM joint tenderness or crepitice  Area of soft tissue over L temple is slt fuller than the R (nt) - is mild/ unsure if baseline  Eyes: Conjunctivae and EOM are normal. Pupils are equal, round, and reactive to light. Right eye exhibits no discharge. Left eye exhibits no discharge. No scleral icterus.  Neck: Normal range of motion. Neck supple.  Cardiovascular: Normal rate and normal heart sounds.  Exam reveals no gallop.   Pulmonary/Chest: Effort normal and breath sounds normal. No respiratory distress. She has no wheezes.  Neurological: She is alert. She has normal reflexes. No cranial nerve deficit.  Skin: Skin is warm and dry. No rash noted. No erythema. No pallor.  Psychiatric: She has a normal mood and affect.          Assessment & Plan:

## 2012-10-17 ENCOUNTER — Telehealth: Payer: Self-pay | Admitting: Family Medicine

## 2012-10-17 DIAGNOSIS — R220 Localized swelling, mass and lump, head: Secondary | ICD-10-CM

## 2012-10-17 DIAGNOSIS — H9202 Otalgia, left ear: Secondary | ICD-10-CM

## 2012-10-17 NOTE — Telephone Encounter (Signed)
Message copied by Judy Pimple on Wed Oct 17, 2012  1:14 PM ------      Message from: Shon Millet      Created: Wed Oct 17, 2012 12:19 PM       Pt notified of lab results and agrees with referral, I advise Shirlee Limerick will call to set appt up ------

## 2012-10-17 NOTE — Telephone Encounter (Signed)
ENT referral

## 2012-10-18 ENCOUNTER — Ambulatory Visit (INDEPENDENT_AMBULATORY_CARE_PROVIDER_SITE_OTHER): Payer: Medicare Other | Admitting: General Practice

## 2012-10-18 DIAGNOSIS — I4891 Unspecified atrial fibrillation: Secondary | ICD-10-CM

## 2012-10-18 DIAGNOSIS — Z5181 Encounter for therapeutic drug level monitoring: Secondary | ICD-10-CM

## 2012-10-18 DIAGNOSIS — Z7901 Long term (current) use of anticoagulants: Secondary | ICD-10-CM

## 2012-10-19 ENCOUNTER — Ambulatory Visit: Payer: Medicare Other

## 2012-11-12 ENCOUNTER — Ambulatory Visit (INDEPENDENT_AMBULATORY_CARE_PROVIDER_SITE_OTHER): Payer: Medicare Other | Admitting: Family Medicine

## 2012-11-12 ENCOUNTER — Encounter: Payer: Self-pay | Admitting: Family Medicine

## 2012-11-12 ENCOUNTER — Ambulatory Visit (INDEPENDENT_AMBULATORY_CARE_PROVIDER_SITE_OTHER): Payer: Medicare Other | Admitting: General Practice

## 2012-11-12 VITALS — BP 112/62 | HR 84 | Temp 97.5°F | Ht 62.0 in

## 2012-11-12 DIAGNOSIS — Z5181 Encounter for therapeutic drug level monitoring: Secondary | ICD-10-CM

## 2012-11-12 DIAGNOSIS — I10 Essential (primary) hypertension: Secondary | ICD-10-CM

## 2012-11-12 DIAGNOSIS — E785 Hyperlipidemia, unspecified: Secondary | ICD-10-CM

## 2012-11-12 DIAGNOSIS — B372 Candidiasis of skin and nail: Secondary | ICD-10-CM | POA: Insufficient documentation

## 2012-11-12 DIAGNOSIS — E1149 Type 2 diabetes mellitus with other diabetic neurological complication: Secondary | ICD-10-CM

## 2012-11-12 DIAGNOSIS — I4891 Unspecified atrial fibrillation: Secondary | ICD-10-CM

## 2012-11-12 DIAGNOSIS — E1142 Type 2 diabetes mellitus with diabetic polyneuropathy: Secondary | ICD-10-CM

## 2012-11-12 DIAGNOSIS — N289 Disorder of kidney and ureter, unspecified: Secondary | ICD-10-CM

## 2012-11-12 DIAGNOSIS — Z7901 Long term (current) use of anticoagulants: Secondary | ICD-10-CM

## 2012-11-12 LAB — COMPREHENSIVE METABOLIC PANEL
ALT: 14 U/L (ref 0–35)
CO2: 26 mEq/L (ref 19–32)
Creatinine, Ser: 1.5 mg/dL — ABNORMAL HIGH (ref 0.4–1.2)
GFR: 35.39 mL/min — ABNORMAL LOW (ref >60.00)
Glucose, Bld: 376 mg/dL — ABNORMAL HIGH (ref 70–99)
Total Bilirubin: 0.7 mg/dL (ref 0.3–1.2)

## 2012-11-12 LAB — LIPID PANEL
LDL Cholesterol: 76 mg/dL (ref 0–99)
Total CHOL/HDL Ratio: 4

## 2012-11-12 LAB — HEMOGLOBIN A1C: Hgb A1c MFr Bld: 11.7 % — ABNORMAL HIGH (ref 4.6–6.5)

## 2012-11-12 LAB — POCT INR: INR: 1.9

## 2012-11-12 NOTE — Patient Instructions (Signed)
Call Dr Dorita Sciara office and let them know that you are still having problems with yeast skin infection in folds (intertrigo)- since what they gave you is not working and I do not know what it is  Labs today for diabetes and kidney function I may end up having you see the diabetes doctor here depending on lab results  Try hard to eat a healthy low sugar diet

## 2012-11-12 NOTE — Assessment & Plan Note (Signed)
Pt has been using a treatment from dermatology- she does not know what it is  inst her to call dermatology office to let them know this is not improved  Need to control blood sugar and also moisture in affected areas  If unable to get this under control will constider an antifungal powder

## 2012-11-12 NOTE — Assessment & Plan Note (Signed)
Lab today - hope for improvement without uti Unable to use ace due to baseline low bp on current med

## 2012-11-12 NOTE — Assessment & Plan Note (Signed)
bp in fair control at this time  No changes needed  Disc lifstyle change with low sodium diet and exercise as tolerated

## 2012-11-12 NOTE — Assessment & Plan Note (Signed)
Lab today-zocor and diet

## 2012-11-12 NOTE — Assessment & Plan Note (Signed)
a1c today Expect this will be up  Per pt she is "not eating much" - but is unable to move much at all so sugar is quite high  Will consider referring her to endocrinology

## 2012-11-12 NOTE — Progress Notes (Signed)
Subjective:    Patient ID: [name], female    DOB: 25-Mar-1923, 79 y.o.   MRN: [id number]  HPI Here for f/u of chronic conditions  She stays aggrivated from her health problems  Still has chronic digestive issues - that frustrates her    bp is stable today  No cp or palpitations or headaches or edema  No side effects to medicines  BP Readings from Last 3 Encounters:  11/12/12 112/62  10/16/12 110/62  09/27/12 130/78     Diabetes Sugars have been quite high- over 300 Lab Results  Component Value Date   HGBA1C 7.9* 06/15/2012    Was unable to afford her levemir for much of the winter (is back on that now) She does use the novolog - and she thinks that makes her itch  No hypoglycemia - rarely goes below 200 "I don't eat anything much" She does not get any activity at all   Renal insuff  Cannot have ace due to baseline low bp   Chemistry      Component Value Date/Time   NA 137 08/15/2012 1343   K 3.9 08/15/2012 1343   CL 104 08/15/2012 1343   CO2 26 08/15/2012 1343   BUN 24* 08/15/2012 1343   CREATININE 1.3* 08/15/2012 1343      Component Value Date/Time   CALCIUM 8.9 08/15/2012 1343   ALKPHOS 41 07/09/2012 1337   AST 23 07/09/2012 1337   ALT 19 07/09/2012 1337   BILITOT 0.8 07/09/2012 1337     this imp after resolution of uti  Lab Results  Component Value Date   TSH 1.615 06/15/2012   thyroid has been stable  Keeps dry skin   Had biopsy on skin on arm -in feb- thinks she has some sort of allergy  Also has redness/ rash in skin folds- worse under R breast now - she was given "some kind of cream" from dermatology that is not working  Is itchy and also burning No oozing  She had much difficulty prev moisture because she tends to sweat   Patient Active Problem List  Diagnosis  . ADENOMATOUS COLONIC POLYP  . DIABETES MELLITUS, TYPE II  . HYPERLIPIDEMIA  . HYPERTENSION  . Atrial fibrillation  . CHRONIC RHINITIS  . GERD  . HIATAL HERNIA  . IRRITABLE  BOWEL SYNDROME  . ROSACEA  . INTERTRIGO  . OSTEOPOROSIS  . UNSPECIFIED VITAMIN D DEFICIENCY  . Lump in the abdomen  . B12 deficiency  . Dysphagia  . Benign positional vertigo  . Pain, chronic  . Frequent falls  . DM type 2 with diabetic peripheral neuropathy  . Metabolic encephalopathy  . Atrial fibrillation with RVR  . Anemia  . Renal insufficiency  . Ear discomfort  . Left facial swelling  . Candidal intertrigo   Past Medical History  Diagnosis Date  . Atrial fibrillation   . Diabetes mellitus     type II  . Anemia     Fe deficiency  . GERD (gastroesophageal reflux disease)   . Hyperlipidemia   . Hypertension   . Hypothyroid   . Obesity   . Vaginal dysplasia 1980  . Rosacea   . Chest pain 11/2002    cardiac cath neg   . Back pain   . IBS (irritable bowel syndrome)     with diarrhea  . Asthma     as a child  . Adenomatous colon polyp 12/2007  . Hemorrhoids   . Vertigo   .  Hiatal hernia 12/2007    EGD   Past Surgical History  Procedure Laterality Date  . Abdominal hysterectomy  1963  . Cholecystectomy  2006  . Knee surgery      Rt TKR 1993 & Lt TKR 2001  . Cervical cone biopsy  1963    D&C  . Appendectomy  1941  . Cystocele repair  1978    /rectocele  . Orif ankle fracture  01/2010    fall/followed by rehab stay  . Cataract extraction  2006    bilateral   History  Substance Use Topics  . Smoking status: Former Games developer  . Smokeless tobacco: Never Used  . Alcohol Use: No   Family History  Problem Relation Age of Onset  . Cancer Mother     leukemia  . Coronary artery disease Mother   . Dementia Mother   . Heart disease Father     CAD  . COPD Sister   . Heart disease Brother   . Diabetes Brother   . Kidney disease Son   . Colon cancer Brother    Allergies  Allergen Reactions  . Ciprofloxacin     Nausea Possibly rash and itching- but not sure   . Insulin Glargine     Itching   . Latex     Rash   . Metformin And Related      Severe diarrhea   . Metoclopramide Hcl     REACTION: unspecified  . Nsaids     REACTION: unspecified  . Promethazine Hcl     REACTION: unspecified   Current Outpatient Prescriptions on File Prior to Visit  Medication Sig Dispense Refill  . furosemide (LASIX) 20 MG tablet Take 1 tablet (20 mg total) by mouth 2 (two) times daily.  60 tablet  0  . glucose blood test strip Use as instructed  100 each  12  . insulin aspart (NOVOLOG FLEXPEN) 100 UNIT/ML injection Inject 6 Units into the skin 3 (three) times daily before meals.  1 vial  1  . insulin detemir (LEVEMIR) 100 UNIT/ML injection Inject 60 Units into the skin every morning.      . Insulin Pen Needle (B-D UF III MINI PEN NEEDLES) 31G X 5 MM MISC Use as directed for insulin pen.  100 each  3  . levothyroxine (SYNTHROID, LEVOTHROID) 150 MCG tablet Take 1 tablet (150 mcg total) by mouth daily.  90 tablet  1  . metoprolol (LOPRESSOR) 50 MG tablet Take 0.5 tablets (25 mg total) by mouth 2 (two) times daily.  90 tablet  1  . omeprazole (PRILOSEC) 20 MG capsule Take 1 capsule (20 mg total) by mouth daily.  90 capsule  1  . potassium chloride SA (K-DUR,KLOR-CON) 20 MEQ tablet Take 1 tablet (20 mEq total) by mouth daily.  90 tablet  1  . simvastatin (ZOCOR) 40 MG tablet Take 1 tablet (40 mg total) by mouth at bedtime.  90 tablet  3  . traMADol (ULTRAM) 50 MG tablet Take 100 mg by mouth 3 (three) times daily as needed. pain      . warfarin (COUMADIN) 5 MG tablet Take 2.5 mg by mouth as directed.        No current facility-administered medications on file prior to visit.    Review of Systems Review of Systems  Constitutional: Negative for fever, appetite change,  and unexpected weight change. Pos for fatigue   Eyes: Negative for pain and visual disturbance.  Respiratory: Negative for cough and shortness  of breath.   Cardiovascular: Negative for cp or palpitations    Gastrointestinal: Negative for nausea,  and constipation. pos for occ cramping  and loose stool Genitourinary: Negative for urgency and frequency.  Skin: Negative for pallor and pos for rash  Neurological: Negative for weakness, light-headedness, numbness and headaches.  Hematological: Negative for adenopathy. Does not bruise/bleed easily.  Psychiatric/Behavioral: Negative for dysphoric mood. The patient is generally anxious          Objective:   Physical Exam  Constitutional: She appears well-developed and well-nourished. No distress.  HENT:  Head: Normocephalic and atraumatic.  Mouth/Throat: Oropharynx is clear and moist.  Eyes: Conjunctivae and EOM are normal. Pupils are equal, round, and reactive to light. Right eye exhibits no discharge. Left eye exhibits no discharge. No scleral icterus.  Neck: Normal range of motion. Neck supple. No JVD present. Carotid bruit is not present. No thyromegaly present.  Cardiovascular: Normal rate and normal heart sounds.  Exam reveals no gallop.   Pulmonary/Chest: Effort normal and breath sounds normal. No respiratory distress. She has no wheezes. She has no rales.  No crackles   Abdominal: Soft. Bowel sounds are normal. She exhibits no distension, no abdominal bruit and no mass. There is no tenderness.  Musculoskeletal: She exhibits no edema and no tenderness.  Lymphadenopathy:    She has no cervical adenopathy.  Neurological: She is alert. No cranial nerve deficit. Coordination normal.  Skin: Skin is warm and dry. Rash noted. There is erythema. No pallor.  Erythema under R breast- bright and confluent with some satellite lesions  Psychiatric: Her mood appears anxious. Her affect is not blunt and not labile.          Assessment & Plan:

## 2012-11-13 ENCOUNTER — Telehealth: Payer: Self-pay

## 2012-11-13 ENCOUNTER — Telehealth: Payer: Self-pay | Admitting: Family Medicine

## 2012-11-13 DIAGNOSIS — E1142 Type 2 diabetes mellitus with diabetic polyneuropathy: Secondary | ICD-10-CM

## 2012-11-13 NOTE — Telephone Encounter (Signed)
Pt request refill Potassium to [facility] mail order; advised 09/07/12 refills of potassium sent to [facility]. Pt will call [facility] for refill.

## 2012-11-13 NOTE — Telephone Encounter (Signed)
Message copied by Judy Pimple on Tue Nov 13, 2012  1:13 PM ------      Message from: Shon Millet      Created: Tue Nov 13, 2012 12:19 PM       Pt notified of lab results and agrees with referral, I advise her Shirlee Limerick will call to set up appt ------

## 2012-11-14 ENCOUNTER — Telehealth: Payer: Self-pay

## 2012-11-14 ENCOUNTER — Other Ambulatory Visit: Payer: Self-pay | Admitting: *Deleted

## 2012-11-14 MED ORDER — POTASSIUM CHLORIDE CRYS ER 20 MEQ PO TBCR: 20.0000 meq | EXTENDED_RELEASE_TABLET | Freq: Every day | ORAL | Status: DC

## 2012-11-14 NOTE — Telephone Encounter (Signed)
Pt spoke with [facility] and they do not have refill done 02/14. Advised pt will resend refill; pt verified taking 20 meq daily.

## 2012-11-15 ENCOUNTER — Ambulatory Visit: Payer: Medicare Other

## 2012-11-15 ENCOUNTER — Other Ambulatory Visit: Payer: Self-pay | Admitting: Family Medicine

## 2012-11-15 ENCOUNTER — Other Ambulatory Visit: Payer: Self-pay | Admitting: *Deleted

## 2012-11-15 MED ORDER — POTASSIUM CHLORIDE CRYS ER 20 MEQ PO TBCR: 20.0000 meq | EXTENDED_RELEASE_TABLET | Freq: Every day | ORAL | Status: DC

## 2012-11-15 MED ORDER — SIMVASTATIN 40 MG PO TABS: 40.0000 mg | ORAL_TABLET | Freq: Every day | ORAL | Status: DC

## 2012-11-15 MED ORDER — TRAMADOL HCL 50 MG PO TABS: 100.0000 mg | ORAL_TABLET | Freq: Three times a day (TID) | ORAL | Status: DC | PRN

## 2012-11-15 MED ORDER — LEVOTHYROXINE SODIUM 150 MCG PO TABS: 150.0000 ug | ORAL_TABLET | Freq: Every day | ORAL | Status: DC

## 2012-11-15 MED ORDER — OMEPRAZOLE 20 MG PO CPDR: 20.0000 mg | DELAYED_RELEASE_CAPSULE | Freq: Every day | ORAL | Status: DC

## 2012-11-15 MED ORDER — METOPROLOL TARTRATE 50 MG PO TABS: 25.0000 mg | ORAL_TABLET | Freq: Two times a day (BID) | ORAL | Status: DC

## 2012-11-15 NOTE — Telephone Encounter (Signed)
Please call or send in electronically

## 2012-11-15 NOTE — Telephone Encounter (Signed)
Received fax refill request, ok to refill  

## 2012-11-15 NOTE — Telephone Encounter (Signed)
Rx sent in electronically.  

## 2012-11-15 NOTE — Telephone Encounter (Signed)
Resent Rx's to [facility], they never received Rx from February

## 2012-11-26 ENCOUNTER — Other Ambulatory Visit: Payer: Self-pay | Admitting: Family Medicine

## 2012-11-26 ENCOUNTER — Ambulatory Visit: Payer: Medicare Other | Admitting: Family Medicine

## 2012-11-27 ENCOUNTER — Ambulatory Visit: Payer: Medicare Other | Admitting: Family Medicine

## 2012-11-28 ENCOUNTER — Ambulatory Visit: Payer: Medicare Other | Admitting: Internal Medicine

## 2012-12-05 ENCOUNTER — Encounter: Payer: Self-pay | Admitting: Internal Medicine

## 2012-12-05 ENCOUNTER — Ambulatory Visit (INDEPENDENT_AMBULATORY_CARE_PROVIDER_SITE_OTHER): Payer: Medicare Other | Admitting: Internal Medicine

## 2012-12-05 ENCOUNTER — Other Ambulatory Visit: Payer: Self-pay | Admitting: *Deleted

## 2012-12-05 VITALS — BP 122/80 | HR 77 | Temp 97.3°F | Resp 12 | Ht 62.0 in | Wt 238.0 lb

## 2012-12-05 DIAGNOSIS — E1142 Type 2 diabetes mellitus with diabetic polyneuropathy: Secondary | ICD-10-CM

## 2012-12-05 DIAGNOSIS — E1149 Type 2 diabetes mellitus with other diabetic neurological complication: Secondary | ICD-10-CM

## 2012-12-05 MED ORDER — "INSULIN SYRINGE-NEEDLE U-100 30G X 1/2"" 0.5 ML MISC": Status: DC

## 2012-12-05 MED ORDER — INSULIN REGULAR HUMAN 100 UNIT/ML IJ SOLN: INTRAMUSCULAR | Status: DC

## 2012-12-05 NOTE — Progress Notes (Signed)
Patient ID: [name], female   DOB: July 23, 1923, 79 y.o.   MRN: [id number]  HPI: [name] is a 79 y.o.-year-old female, referred by her PCP, Dr. Milinda Antis, for management of DM2, insulin-dependent, uncontrolled, with complications (CKD, peripheral neuropathy).  Patient has been diagnosed with diabetes in 1988; she started insulin 2 years ago. Last hemoglobin A1c was: Lab Results  Component Value Date   HGBA1C 11.7* 11/12/2012  Previously, 7.9%, prev. 11%.  Pt is on a regimen of: - Levemir 60 units qhs - 5 pens = 45$, but will approach the "gap", so will need to pay more - Novolog 6 units tid ac >> not taking more than 12 units a day. She is not hungry and she eats one meal (lunch) and a snack at night.  She tells me that she developed severe diarrhea from metformin. She still has diarrhea after stopping Metformin, although not as severe. She did not tolerate Lantus b/c rash.  She could not afford her Levemir during this winter, now she is back on it.  She was telling PCP that she does not use NovoLog as it makes her itch, but now she tells me she is using it. She was recently seen by derm and a skin Bx showed drug allergic rxn. She believes the rash started after she started NovoLog.   At the last visit with PCP on 11/12/2012, she was confirming that her sugars are high, more than 300, rarely less than 200. Pt checks her sugars 1-3 a day and they are: - am: 200-400, this am 353 - later in the day is the same  No lows. Lowest sugar was 100s in last 6 month; she has hypoglycemia awareness at 70. Highest sugar was upper 300s, not quite 400.  Pt's meals are: - Breakfast: applesauce/yoghurt/toast - Lunch: chicken/fish + 2 veggies - Dinner: ? - Snacks: 0-2 She is in a wheelchair, does not exercise. She tells me the only time she leaves the house is for Dr's appts. She cannot even get on the porch anymore as her husband built a ramp for her wheelchair, but difficult for her to use.  However, at home, she uses a walker.   Pt does not have chronic kidney disease, last BUN/creatinine was:  Lab Results  Component Value Date   BUN 26* 11/12/2012   CREATININE 1.5* 11/12/2012   Last set of lipids: Lab Results  Component Value Date   CHOL 133 11/12/2012   HDL 32.30* 11/12/2012   LDLCALC 76 11/12/2012   LDLDIRECT 160.8 09/22/2010   TRIG 122.0 11/12/2012   CHOLHDL 4 11/12/2012   Pt's last eye exam was in last year. No DR, reportedly. Denies numbness and tingling in her legs.  PMH: I reviewed her chart and she also has a history of hypothyroidism, HL, HTN, A fib (on coumadin), GERD, IBS, hiatal hernia, BPPV, anemia, cataracts - s/p extraction 2006, vit D and B12 def., osteoporosis-s/p ankle fx p fall - with ORIF 2011.  Pt has FH of DM in her brother.  ROS: Constitutional: + weight gain,+ fatigue, + decreased appetite, + chills Eyes: + occ. blurry vision, no xerophthalmia ENT: + sore throat, no nodules palpated in throat, + dysphagia/no odynophagia, no hoarseness Cardiovascular: no CP/+ SOB/+ palpitations/+ legs swelling Respiratory: + cough/+ SOB Gastrointestinal: no N/V/D/C Musculoskeletal: + muscle/+ joint aches and swelling Skin: + rash - extending on arms, legs, easy bruising, itching Neurological: no tremors/numbness/tingling/dizziness, occas. HAs Psychiatric: no depression/anxiety  Past Surgical History  Procedure Laterality  Date  . Abdominal hysterectomy  1963  . Cholecystectomy  2006  . Knee surgery      Rt TKR 1993 & Lt TKR 2001  . Cervical cone biopsy  1963    D&C  . Appendectomy  1941  . Cystocele repair  1978    /rectocele  . Orif ankle fracture  01/2010    fall/followed by rehab stay  . Cataract extraction  2006    bilateral   History   Social History  . Marital Status: Married    Spouse Name: N/A    Number of Children: 3   Occupational History  . Retired     Paramedic Work    Social History Main Topics  . Smoking status: Former Games developer  .  Smokeless tobacco: Never Used  . Alcohol Use: No  . Drug Use: No   Current Outpatient Prescriptions on File Prior to Visit  Medication Sig Dispense Refill  . furosemide (LASIX) 20 MG tablet Take 1 tablet (20 mg total) by mouth 2 (two) times daily.  60 tablet  0  . insulin aspart (NOVOLOG FLEXPEN) 100 UNIT/ML injection Inject 6 Units into the skin 3 (three) times daily before meals.  1 vial  1  . insulin detemir (LEVEMIR) 100 UNIT/ML injection Inject 60 Units into the skin every morning.      . Insulin Pen Needle (B-D UF III MINI PEN NEEDLES) 31G X 5 MM MISC Use as directed for insulin pen.  100 each  3  . LEVEMIR FLEXPEN 100 UNIT/ML injection INJECT 64 UNITS SUBCUTANEOUSLY EVERY MORNING  30 mL  3  . levothyroxine (SYNTHROID, LEVOTHROID) 150 MCG tablet Take 1 tablet (150 mcg total) by mouth daily.  90 tablet  1  . metoprolol (LOPRESSOR) 50 MG tablet Take 0.5 tablets (25 mg total) by mouth 2 (two) times daily.  90 tablet  1  . omeprazole (PRILOSEC) 20 MG capsule Take 1 capsule (20 mg total) by mouth daily.  90 capsule  1  . potassium chloride SA (K-DUR,KLOR-CON) 20 MEQ tablet Take 1 tablet (20 mEq total) by mouth daily.  90 tablet  1  . simvastatin (ZOCOR) 40 MG tablet Take 1 tablet (40 mg total) by mouth at bedtime.  90 tablet  1  . traMADol (ULTRAM) 50 MG tablet Take 2 tablets (100 mg total) by mouth 3 (three) times daily as needed. pain  90 tablet  3  . warfarin (COUMADIN) 5 MG tablet Take 2.5 mg by mouth as directed.        No current facility-administered medications on file prior to visit.   Allergies  Allergen Reactions  . Ciprofloxacin     Nausea Possibly rash and itching- but not sure   . Insulin Glargine     Itching   . Latex     Rash   . Metformin And Related     Severe diarrhea   . Metoclopramide Hcl     REACTION: unspecified  . Nsaids     REACTION: unspecified  . Promethazine Hcl     REACTION: unspecified   Family History  Problem Relation Age of Onset  . Cancer  Mother     leukemia  . Coronary artery disease Mother   . Dementia Mother   . Heart disease Father     CAD  . COPD Sister   . Heart disease Brother   . Diabetes Brother   . Kidney disease Son   . Colon cancer Brother    PE:  LMP 09/01/1961 Wt Readings from Last 3 Encounters:  09/27/12 249 lb (112.946 kg)  07/17/12 254 lb 12 oz (115.554 kg)  06/21/12 248 lb 0.3 oz (112.5 kg)   Constitutional: obese, in wheelchair, in NAD Eyes: PERRLA, EOMI, no exophthalmos ENT: moist mucous membranes, no thyromegaly, no cervical lymphadenopathy Cardiovascular: RRR, No MRG Respiratory: CTA B Gastrointestinal: abdomen soft, NT, ND, BS+ Musculoskeletal: no deformities, strength intact in all 4 Skin: moist, warm, + rashes: papular on arms and legs; stasis dermatitis lower legs Neurological: no tremor with outstretched hands, DTR normal in all 4  ASSESSMENT: 1. DM2, insulin-dependent, uncontrolled, with complications - CKD - peripheral neuropathy  PLAN:  1. Pt with long standing DM2, recently more uncontrolled. She has intolerance to metformin 2/2 diarrhea. She does not take rapid acting insulin consistently as she does not eat 3 full meals a day (might eat only one a day). She has problems affording the insulins. Unclear whether she has an allergy to NovoLog. Sugars consistently high: 200-300 per recall. - I would like to change her Levemir to NPH, but I am afraid she has more chances of hypoglycemia with this. At this age, Lantus/Levemir are safest. To improve the effect, we will split Levemir in 2 doses - it might work a little better. - I suggested that we make the following changes in her regimen: Please continue Levemir but split it in 2 doses: 30 units in am and 30 units at bedtime. Increase the mealtime insulin to 12 units. Stop the Novolog and start Regular insulin. You need to inject this 30 min before a meal. Try to eat at least 2 meals a day. - given sugar log and advised how to fill it  and to bring it at next appt - check at least 2x a day, before meals and at bedtime, rotating checks - given foot care handout and explained the principles - given instructions for hypoglycemia management "15-15 rule" - I will see her back in 1 mo with her sugar log

## 2012-12-05 NOTE — Telephone Encounter (Signed)
Rx for regular insulin did not go through.Re-sending.

## 2012-12-05 NOTE — Patient Instructions (Addendum)
Please return in 1 month with your sugar log. Check sugars 2x a day. Please continue Levemir but split it in 2 doses: 30 units in am and 30 units at bedtime. Increase the mealtime insulin to 12 units. Stop the Novolog and start Regular insulin. You need to inject this 30 min before a meal. Try to eat at least 2 meals a day.  PATIENT INSTRUCTIONS FOR TYPE 2 DIABETES:  **Please join MyChart!** - see attached instructions about how to join   DIET AND EXERCISE Diet and exercise is an important part of diabetic treatment.  We recommended aerobic exercise in the form of brisk walking (working between 40-60% of maximal aerobic capacity, similar to brisk walking) for 150 minutes per week (such as 30 minutes five days per week) along with 3 times per week performing 'resistance' training (using various gauge rubber tubes with handles) 5-10 exercises involving the major muscle groups (upper body, lower body and core) performing 10-15 repetitions (or near fatigue) each exercise. Start at half the above goal but build slowly to reach the above goals. If limited by weight, joint pain, or disability, we recommend daily walking in a swimming pool with water up to waist to reduce pressure from joints while allow for adequate exercise.    BLOOD GLUCOSES Monitoring your blood glucoses is important for continued management of your diabetes. Please check your blood glucoses 2-4 times a day: fasting, before meals and at bedtime (you can rotate these measurements - e.g. one day check before the 3 meals, the next day check before 2 of the meals and before bedtime, etc.   HYPOGLYCEMIA (low blood sugar) Hypoglycemia is usually a reaction to not eating, exercising, or taking too much insulin/ other diabetes drugs.  Symptoms include tremors, sweating, hunger, confusion, headache, etc. Treat IMMEDIATELY with 15 grams of Carbs:   4 glucose tablets    cup regular juice/soda   2 tablespoons raisins   4 teaspoons sugar   1  tablespoon honey Recheck blood glucose in 15 mins and repeat above if still symptomatic/blood glucose <100. Please contact our office at [phone] if you have questions about how to next handle your insulin.  RECOMMENDATIONS TO REDUCE YOUR RISK OF DIABETIC COMPLICATIONS: * Take your prescribed MEDICATION(S). * Follow a DIABETIC diet: Complex carbs, fiber rich foods, heart healthy fish twice weekly, (monounsaturated and polyunsaturated) fats * AVOID saturated/trans fats, high fat foods, >2,300 mg salt per day. * EXERCISE at least 5 times a week for 30 minutes or preferably daily.  * DO NOT SMOKE OR DRINK more than 1 drink a day. * Check your FEET every day. Do not wear tightfitting shoes. Contact us if you develop an ulcer * See your EYE doctor once a year or more if needed * Get a FLU shot once a year * Get a PNEUMONIA vaccine once before and once after age 19 years  GOALS:  * Your Hemoglobin A1c of <7%  * Your Systolic BP should be 140 or lower  * Your Diastolic BP should be 80 or lower  * Your HDL (Good Cholesterol) should be 40 or higher  * Your LDL (Bad Cholesterol) should be 100 or lower  * Your Triglycerides should be 150 or lower  * Your Urine microalbumin (kidney function) should be <30 * Your Body Mass Index should be 25 or lower   We will be glad to help you achieve these goals. Our telephone number is: [phone].

## 2012-12-10 ENCOUNTER — Ambulatory Visit (INDEPENDENT_AMBULATORY_CARE_PROVIDER_SITE_OTHER): Payer: Medicare Other | Admitting: General Practice

## 2012-12-10 DIAGNOSIS — Z5181 Encounter for therapeutic drug level monitoring: Secondary | ICD-10-CM

## 2012-12-10 DIAGNOSIS — I4891 Unspecified atrial fibrillation: Secondary | ICD-10-CM

## 2012-12-10 DIAGNOSIS — Z7901 Long term (current) use of anticoagulants: Secondary | ICD-10-CM

## 2012-12-10 LAB — POCT INR: INR: 1.5

## 2012-12-20 ENCOUNTER — Ambulatory Visit (INDEPENDENT_AMBULATORY_CARE_PROVIDER_SITE_OTHER): Payer: Medicare Other | Admitting: Family Medicine

## 2012-12-20 DIAGNOSIS — Z5181 Encounter for therapeutic drug level monitoring: Secondary | ICD-10-CM

## 2012-12-20 DIAGNOSIS — Z7901 Long term (current) use of anticoagulants: Secondary | ICD-10-CM

## 2012-12-20 DIAGNOSIS — I4891 Unspecified atrial fibrillation: Secondary | ICD-10-CM

## 2013-01-09 ENCOUNTER — Encounter: Payer: Self-pay | Admitting: Internal Medicine

## 2013-01-09 ENCOUNTER — Ambulatory Visit (INDEPENDENT_AMBULATORY_CARE_PROVIDER_SITE_OTHER): Payer: Medicare Other | Admitting: Internal Medicine

## 2013-01-09 VITALS — BP 124/80 | HR 85 | Temp 97.5°F | Resp 16 | Ht 62.0 in

## 2013-01-09 DIAGNOSIS — E1149 Type 2 diabetes mellitus with other diabetic neurological complication: Secondary | ICD-10-CM

## 2013-01-09 DIAGNOSIS — E1142 Type 2 diabetes mellitus with diabetic polyneuropathy: Secondary | ICD-10-CM

## 2013-01-09 MED ORDER — INSULIN NPH ISOPHANE & REGULAR (70-30) 100 UNIT/ML ~~LOC~~ SUSP: SUBCUTANEOUS | Status: DC

## 2013-01-09 NOTE — Progress Notes (Signed)
Patient ID: [name], female   DOB: [birth year], 79 y.o.   MRN: [id number]  HPI: [name] is a 79 y.o.-year-old female, returning for f/u for DM2, insulin-dependent, uncontrolled, with complications (CKD, peripheral neuropathy).  Patient has been diagnosed with diabetes in 1988; she started insulin 2 years ago. Last hemoglobin A1c was: Lab Results  Component Value Date   HGBA1C 11.7* 11/12/2012  Previously, 7.9%, prev. 11%.  Pt is on a regimen of: - Levemir 32 units bid - 5 pens = 45$, but will approach the "gap", so she tells me she cannot really afford this - Regular insulin 12 units - takes it sparsely as she says she does not eat much, but usually bid. She is not hungry and she eats one meal (lunch) and a snack at night.  She tells me that she developed severe diarrhea from metformin. She still has diarrhea after stopping Metformin, although not as severe. She did not tolerate Lantus b/c rash.  She could not afford her Levemir during this winter, now she is back on it.  She was telling PCP that she does not use NovoLog as it makes her itch, but then used it again. She was recently seen by derm and a skin Bx showed drug allergic rxn. She believes the rash started after she started NovoLog.   Pt checks her sugars 1-3 a day and they are: - am: 200-400, largest 353 >> now 174-325 - later in the day is the same - she checks at different times of the day >> higher (300s) before dinner and bedtime Few values in the 80s, 90s, when did not eat anything else other than strawberries...; she has hypoglycemia awareness at 77. Highest sugar was 494 2h after lunch 1 mo ago.  Pt's meals are: - Breakfast: applesauce/yoghurt/toast - Lunch: chicken/fish + 2 veggies - Dinner: ? - Snacks: 0-2 She is in a wheelchair, does not exercise.   Pt does not have chronic kidney disease, last BUN/creatinine was:  Lab Results  Component Value Date   BUN 26* 11/12/2012   CREATININE 1.5* 11/12/2012   Pt's last eye exam was in last year. No DR, reportedly. Denies numbness and tingling in her legs.  PMH: She also has a history of hypothyroidism, HL, HTN, A fib (on coumadin), GERD, IBS, hiatal hernia, BPPV, anemia, cataracts - s/p extraction 2006, vit D and B12 def., osteoporosis-s/p ankle fx p fall - with ORIF 2011.  I reviewed pt's medications, allergies, PMH, social hx, family hx and no changes required, except as mentioned above.  ROS: Constitutional: + weight gain and loss,+ fatigue, + decreased appetite, + chills Eyes: + occ. blurry vision, no xerophthalmia ENT: no sore throat, no nodules palpated in throat, no dysphagia/no odynophagia, no hoarseness Cardiovascular: no CP/+ SOB/no palpitations/+ legs swelling Respiratory: + cough/+ SOB Gastrointestinal: + N/no V/+D/C Musculoskeletal: + muscle/+ joint aches and swelling Skin: + rash - extending on arms, legs, easy bruising, itching Neurological: no tremors/numbness/tingling/dizziness, occas. HAs Psychiatric: no depression/anxiety  PE: BP 124/80  Pulse 85  Temp(Src) 97.5 F (36.4 C) (Oral)  Resp 16  Ht 5\' 2"  (1.575 m)  SpO2 95%  LMP 09/01/1961 Wt Readings from Last 3 Encounters:  12/05/12 238 lb (107.956 kg)  09/27/12 249 lb (112.946 kg)  07/17/12 254 lb 12 oz (115.554 kg)  Refused being weighed today.  Constitutional: obese, in wheelchair, in NAD Eyes: PERRLA, EOMI, no exophthalmos ENT: moist mucous membranes, no thyromegaly, no cervical lymphadenopathy Cardiovascular: RRR, No MRG Respiratory:  CTA B Gastrointestinal: abdomen soft, NT, ND, BS+ Musculoskeletal: no deformities, strength intact in all 4 Skin: moist, warm, + rashes: papular on arms and legs; stasis dermatitis lower legs Neurological: no tremor with outstretched hands, DTR normal in all 4  ASSESSMENT: 1. DM2, insulin-dependent, uncontrolled, with complications - CKD - peripheral neuropathy  PLAN:  1. Pt with long standing DM2, recently more  uncontrolled, but with a period of ~ 2 weeks in which the sugars were better (at the end of last month and beginning of this month) at that time, she mentions that she was eating mostly strawberries and other fruit. She does not take rapid acting insulin consistently as she does not eat 3 full meals a day (might eat only one a day). She has problems affording the insulins and the disc visits we will need to switch from the hernia since this is expensive for her.  -  At this age, Lantus/Levemir are safest but since she cannot afford it, will switch to Humulin 70/30, will try to use pens, since she cannot draw insulin well in the syringe, however we can switch to vials if the pens are too expensive for her. I advised her to inject 50 units 30 minutes before breakfast and dinner. This regimen will give her 35 units of NPH and 15 units of regular twice a day. I also advised her to try to eat consistent breakfast and dinner, however if she eats a small meal, to only inject 40 units.  - given new sugar logs - check at least 2x a day, before meals and at bedtime, rotating checks - I will see her back in 1 mo with her sugar log

## 2013-01-09 NOTE — Patient Instructions (Signed)
Please stop the Levemir and Regular insulin. Start Humulin 70/30 insulin at 50 units 30 min before breakfast and dinner. Please inject 40 units if you have a small meal. Return in 1 month with your sugar log. Please try to eat a regular breakfast and lunch.

## 2013-01-10 ENCOUNTER — Other Ambulatory Visit: Payer: Self-pay

## 2013-01-10 MED ORDER — TRAMADOL HCL 50 MG PO TABS: ORAL_TABLET | ORAL | Status: DC

## 2013-01-10 NOTE — Telephone Encounter (Signed)
Pt said she has been taking 2 tab TID of tramadol for a while and that's how you gave her the Rx last time, pt said taking 1 pill TID wouldn't help at all and wouldn't want you to write a Rx, pt said her orthopedic doctor that originally treated her has retired and closed his practice, I asked pt if she would like [place] to try to refer her to a new ortho doc for evaluation and she said no she will just go back to Dr. Pryor Ochoa who did surgery on her ankle. Pt said she was over due for a follow up with him but never went back because she can't afford to keep paying co-pays to all the doctors she sees. I encouraged pt to at least call their office to let them know she is having pain and she said it's pointless because they are going to make her come in for an appt she can't afford, pt said she is in constant pain and the only thing that helps is if she is on the tramadol or laying down, and pt said she "doesn't want to stay in bed the rest of her life"

## 2013-01-10 NOTE — Telephone Encounter (Signed)
Due to her age and other medical problems - my max recommendation would be 1 pill three times per day - let her know that and if ok I will send it in  If her pain is that severe she may need to talk to the orthopedic doctor that treated her for it orignally as well to disc other tx options

## 2013-01-10 NOTE — Telephone Encounter (Signed)
Pt said she needs more than # 90 of Tramadol sent to [facility]; pt said 180 tabs is just enough for one month and pt sending to mail order for 3 months supply if possible. pt having arthritis pain and pain in ankle that she had previous surgery. Pt said now she is taking 2 tabs 3 times a day.

## 2013-01-10 NOTE — Telephone Encounter (Signed)
I would rather she minimize the dose as much as possible- this medicine causes dizziness and falls in pt's her age and that worries me - also with such chronic use  I will send the px to her mail order pharmacy for 3 months  Please use caution  Please send a copy of this phone note to Dr Kirtland Bouchard so he is aware  We may need to consider a pain clinic referral in the future

## 2013-01-11 NOTE — Telephone Encounter (Signed)
Pt advise Rx sent to pharmacy and advised of Dr. Royden Purl recommendations regarding taking medication, pt verbalized understanding   phone note also routed to Dr. Dion Saucier

## 2013-01-16 ENCOUNTER — Other Ambulatory Visit: Payer: Self-pay | Admitting: Family Medicine

## 2013-01-17 ENCOUNTER — Ambulatory Visit: Payer: Medicare Other

## 2013-01-18 ENCOUNTER — Telehealth: Payer: Self-pay | Admitting: *Deleted

## 2013-01-18 NOTE — Telephone Encounter (Signed)
I called pt and she relayed a number of problems, sores on ear, sinus problems, hearing loss, dizziness, headache.   I relayed that she would need to see pcp, for evaluation and if needs neurologist then can call back.  She has seen ENT, recently is sounded like.  She verbalized that she understood.

## 2013-01-18 NOTE — Telephone Encounter (Signed)
Noted  

## 2013-01-18 NOTE — Telephone Encounter (Signed)
Message copied by Hermenia Fiscal on Fri Jan 18, 2013  2:22 PM ------      Message from: Seth Bake      Created: Thu Jan 17, 2013  2:04 PM      Contact: Patient       Having a strange problem.  Larey Seat about 6 weeks ago and after she couldn't hear out of her left ear.  Went to the place she gets her hearing aids and ENT and all was fine.  Still not able to hear very well.  Yesterday she started having swollen sinus and cheeks.  If she moves her head and jaw she has sharp pain.  Please call [phone] ------

## 2013-01-21 ENCOUNTER — Ambulatory Visit (INDEPENDENT_AMBULATORY_CARE_PROVIDER_SITE_OTHER): Payer: Medicare Other | Admitting: Family Medicine

## 2013-01-21 ENCOUNTER — Ambulatory Visit: Payer: Medicare Other

## 2013-01-21 DIAGNOSIS — Z7901 Long term (current) use of anticoagulants: Secondary | ICD-10-CM

## 2013-01-21 DIAGNOSIS — I4891 Unspecified atrial fibrillation: Secondary | ICD-10-CM

## 2013-01-21 DIAGNOSIS — Z5181 Encounter for therapeutic drug level monitoring: Secondary | ICD-10-CM

## 2013-01-21 LAB — POCT INR: INR: 2

## 2013-02-04 ENCOUNTER — Ambulatory Visit (INDEPENDENT_AMBULATORY_CARE_PROVIDER_SITE_OTHER): Payer: Medicare Other | Admitting: Family Medicine

## 2013-02-04 ENCOUNTER — Other Ambulatory Visit: Payer: Self-pay | Admitting: Family Medicine

## 2013-02-04 ENCOUNTER — Encounter: Payer: Self-pay | Admitting: Family Medicine

## 2013-02-04 VITALS — BP 112/78 | HR 82 | Temp 97.8°F

## 2013-02-04 DIAGNOSIS — I4891 Unspecified atrial fibrillation: Secondary | ICD-10-CM

## 2013-02-04 DIAGNOSIS — Z5181 Encounter for therapeutic drug level monitoring: Secondary | ICD-10-CM

## 2013-02-04 DIAGNOSIS — T148XXA Other injury of unspecified body region, initial encounter: Secondary | ICD-10-CM | POA: Insufficient documentation

## 2013-02-04 DIAGNOSIS — Z7901 Long term (current) use of anticoagulants: Secondary | ICD-10-CM

## 2013-02-04 MED ORDER — WARFARIN SODIUM 1 MG PO TABS: ORAL_TABLET | ORAL | Status: DC

## 2013-02-04 MED ORDER — SILVER SULFADIAZINE 1 % EX CREA: TOPICAL_CREAM | Freq: Every day | CUTANEOUS | Status: DC

## 2013-02-04 NOTE — Patient Instructions (Signed)
Change the bandage once a day.  Use a nonstick bandage with silvadene on the area.  Keep it wrapped with with gauze or the brown wrapping.   This should slowly resolve.  Take care.

## 2013-02-04 NOTE — Progress Notes (Signed)
Blister on R knee recently noted.  Ruptured and had clear discharge, 2 days ago.  Covered, but the bandage stuck.  Here for eval.  No other similar lesions. No h/o bullous d/o.   Meds, vitals, and allergies reviewed.   ROS: See HPI.  Otherwise, noncontributory.  nad R knee with anterior 10x5cm superficial lesion. Remnant of blister noted on the rim.

## 2013-02-04 NOTE — Assessment & Plan Note (Signed)
Isolated, would presume bullous disorder with 1 lesion.   Covered with nonstick bandage.  See notes re: INR.  Would use silvadene daily.   Doesn't appear infected. Daily dressing change.  F/u prn. To PCP as FYI.  Pt agrees with plan.

## 2013-02-05 ENCOUNTER — Telehealth: Payer: Self-pay

## 2013-02-05 NOTE — Telephone Encounter (Signed)
Pt said [facility] does not have Telfa; spoke with [facility] and they do not have name brand Telfa but they do have non stick bandage. Advised pt [facility] does have non stick bandage and to ask to speak with pharmacist if question when she goes to [facility]. Pt voiced understanding.

## 2013-02-06 ENCOUNTER — Ambulatory Visit (INDEPENDENT_AMBULATORY_CARE_PROVIDER_SITE_OTHER): Payer: Medicare Other | Admitting: Family Medicine

## 2013-02-06 ENCOUNTER — Encounter: Payer: Self-pay | Admitting: Internal Medicine

## 2013-02-06 ENCOUNTER — Encounter: Payer: Self-pay | Admitting: Family Medicine

## 2013-02-06 ENCOUNTER — Ambulatory Visit (INDEPENDENT_AMBULATORY_CARE_PROVIDER_SITE_OTHER): Payer: Medicare Other | Admitting: Internal Medicine

## 2013-02-06 VITALS — BP 122/72 | HR 80 | Temp 97.9°F | Ht 62.0 in

## 2013-02-06 VITALS — BP 124/68 | HR 89 | Temp 97.8°F | Resp 16 | Ht 62.0 in

## 2013-02-06 DIAGNOSIS — E1142 Type 2 diabetes mellitus with diabetic polyneuropathy: Secondary | ICD-10-CM

## 2013-02-06 DIAGNOSIS — E1149 Type 2 diabetes mellitus with other diabetic neurological complication: Secondary | ICD-10-CM

## 2013-02-06 DIAGNOSIS — T148XXA Other injury of unspecified body region, initial encounter: Secondary | ICD-10-CM

## 2013-02-06 DIAGNOSIS — B372 Candidiasis of skin and nail: Secondary | ICD-10-CM

## 2013-02-06 NOTE — Progress Notes (Signed)
Subjective:    Patient ID: [name], female    DOB: October 12, 1922, 79 y.o.   MRN: [id number]  HPI Here for a skin lesion on her abdomen  Came up yesterday  Thinks it "busted open"- oozes on and off  (she is on coumadin and bleeds very easily) No fever or other symptoms   Lab Results  Component Value Date   INR 3.3 02/04/2013   INR 2.0 01/21/2013   INR 3.9 12/20/2012       Was seen for blister on knee - is getting better and still tender  No worse  Seen Dr Para March for that and treating it with gauze dressing and silvadene  Patient Active Problem List   Diagnosis Date Noted  . Blister 02/04/2013  . Candidal intertrigo 11/12/2012  . Ear discomfort 10/16/2012  . Left facial swelling 10/16/2012  . Renal insufficiency 07/17/2012  . Anemia 06/17/2012  . Metabolic encephalopathy 06/15/2012  . Atrial fibrillation with RVR 06/15/2012  . DM type 2 with diabetic peripheral neuropathy 04/10/2012  . Pain, chronic 12/07/2011  . Frequent falls 12/07/2011  . Benign positional vertigo 10/19/2011  . Dysphagia 10/04/2011  . B12 deficiency 02/18/2011  . Lump in the abdomen 11/24/2010  . UNSPECIFIED VITAMIN D DEFICIENCY 07/12/2010  . OSTEOPOROSIS 06/30/2010  . CHRONIC RHINITIS 01/13/2010  . ADENOMATOUS COLONIC POLYP 05/26/2008  . IRRITABLE BOWEL SYNDROME 09/03/2007  . INTERTRIGO 06/29/2007  . DIABETES MELLITUS, TYPE II 01/12/2007  . HYPERLIPIDEMIA 01/12/2007  . HYPERTENSION 01/12/2007  . Atrial fibrillation 01/12/2007  . GERD 01/12/2007  . HIATAL HERNIA 01/12/2007  . ROSACEA 01/12/2007   Past Medical History  Diagnosis Date  . Atrial fibrillation   . Diabetes mellitus     type II  . Anemia     Fe deficiency  . GERD (gastroesophageal reflux disease)   . Hyperlipidemia   . Hypertension   . Hypothyroid   . Obesity   . Vaginal dysplasia 1980  . Rosacea   . Chest pain 11/2002    cardiac cath neg   . Back pain   . IBS (irritable bowel syndrome)     with diarrhea  .  Asthma     as a child  . Adenomatous colon polyp 12/2007  . Hemorrhoids   . Vertigo   . Hiatal hernia 12/2007    EGD   Past Surgical History  Procedure Laterality Date  . Abdominal hysterectomy  1963  . Cholecystectomy  2006  . Knee surgery      Rt TKR 1993 & Lt TKR 2001  . Cervical cone biopsy  1963    D&C  . Appendectomy  1941  . Cystocele repair  1978    /rectocele  . Orif ankle fracture  01/2010    fall/followed by rehab stay  . Cataract extraction  2006    bilateral   History  Substance Use Topics  . Smoking status: Former Games developer  . Smokeless tobacco: Never Used  . Alcohol Use: No   Family History  Problem Relation Age of Onset  . Cancer Mother     leukemia  . Coronary artery disease Mother   . Dementia Mother   . Heart disease Father     CAD  . COPD Sister   . Heart disease Brother   . Diabetes Brother   . Kidney disease Son   . Colon cancer Brother    Allergies  Allergen Reactions  . Ciprofloxacin     Nausea Possibly rash and itching- but  not sure   . Gabapentin Nausea And Vomiting  . Insulin Glargine     Itching   . Latex     Rash   . Metformin And Related     Severe diarrhea   . Metoclopramide Hcl     REACTION: unspecified  . Nsaids     REACTION: unspecified  . Promethazine Hcl     REACTION: unspecified   Current Outpatient Prescriptions on File Prior to Visit  Medication Sig Dispense Refill  . acetaminophen (TYLENOL) 500 MG tablet Take 500 mg by mouth every 6 (six) hours as needed for pain.      . BD PEN NEEDLE NANO U/F 32G X 4 MM MISC USE AS DIRECTED WITH INSULIN PEN  100 each  1  . furosemide (LASIX) 20 MG tablet Take 1 tablet (20 mg total) by mouth 2 (two) times daily.  60 tablet  0  . HUMULIN R 100 UNIT/ML injection       . insulin NPH-regular (HUMULIN 70/30) (70-30) 100 UNIT/ML injection Inject under skin 50 units 30 min before breakfast and dinner. Pens please.  30 mL  5  . Insulin Syringe-Needle U-100 (B-D INS SYRINGE  0.5CC/30GX1/2") 30G X 1/2" 0.5 ML MISC Inject 2-3 x a day  100 each  11  . levothyroxine (SYNTHROID, LEVOTHROID) 150 MCG tablet Take 1 tablet (150 mcg total) by mouth daily.  90 tablet  1  . loperamide (IMODIUM) 2 MG capsule Take 2 mg by mouth 4 (four) times daily as needed for diarrhea or loose stools.      . meclizine (ANTIVERT) 25 MG tablet Take 25 mg by mouth 3 (three) times daily as needed.      . metoprolol (LOPRESSOR) 50 MG tablet Take 0.5 tablets (25 mg total) by mouth 2 (two) times daily.  90 tablet  1  . omeprazole (PRILOSEC) 20 MG capsule Take 1 capsule (20 mg total) by mouth daily.  90 capsule  1  . ONE TOUCH ULTRA TEST test strip       . potassium chloride SA (K-DUR,KLOR-CON) 20 MEQ tablet Take 1 tablet (20 mEq total) by mouth daily.  90 tablet  1  . silver sulfADIAZINE (SILVADENE) 1 % cream Apply topically daily.  50 g  0  . simvastatin (ZOCOR) 40 MG tablet Take 1 tablet (40 mg total) by mouth at bedtime.  90 tablet  1  . traMADol (ULTRAM) 50 MG tablet 1-2 pills by mouth up to three times daily as needed for severe pain  540 tablet  3  . warfarin (COUMADIN) 1 MG tablet Take as directed by [facility].  30 tablet  0  . warfarin (COUMADIN) 5 MG tablet Take 2.5 mg by mouth as directed.        No current facility-administered medications on file prior to visit.     Review of Systems Review of Systems  Constitutional: Negative for fever, appetite change,  and unexpected weight change. pos for chronic gen fatigue  Eyes: Negative for pain and visual disturbance.  Respiratory: Negative for cough and shortness of breath.   Cardiovascular: Negative for cp or palpitations    Gastrointestinal: Negative for nausea, diarrhea and constipation.  Genitourinary: Negative for urgency and frequency.  Skin: Negative for pallor or insect/tick bite pos for blisters on knee and abd and also for yeast intertrigo under breasts  Neurological: Negative for weakness, light-headedness, numbness and  headaches.  Hematological: Negative for adenopathy. Does  bruise/bleed easily.(on coumadin)  Psychiatric/Behavioral: Negative for  dysphoric mood. The patient is nervous/anxious.         Objective:   Physical Exam  Constitutional: She appears well-developed and well-nourished. No distress.  Elderly chronically ill appearing female in wheelchair   HENT:  Head: Normocephalic and atraumatic.  Mouth/Throat: Oropharynx is clear and moist.  Eyes: Conjunctivae and EOM are normal. Pupils are equal, round, and reactive to light. No scleral icterus.  Neck: Normal range of motion. Neck supple.  Cardiovascular: Normal rate and regular rhythm.   Abdominal: Soft. Bowel sounds are normal. She exhibits no distension and no mass. There is no tenderness. There is no rebound and no guarding.  Musculoskeletal: She exhibits edema.  Lymphadenopathy:    She has no cervical adenopathy.  Neurological: She is alert.  Skin: Skin is warm and dry. There is erythema.  Sallow complexion baseline   Healing blister on R knee- is dressed with silvadene and tender without erythema   Blister on lower abdomen - is 2-3 cm -half denuded with clear drainage and some granulation tissue Tender also without surrounding erythema  Intertrigo under breasts is dressed with gauze- redness noted with few areas of denuded skin as well   Several bruises widespread -baseline for anticoag status  Psychiatric: She has a normal mood and affect.          Assessment & Plan:

## 2013-02-06 NOTE — Patient Instructions (Addendum)
Use the silvadene cream on your abdominal wound - keep a non stick (telfa) gauze folded in the area until healed We sent off a wound culture and I will update you about that Please send for last 3 notes from Dr Dorita Sciara office (derm)

## 2013-02-06 NOTE — Progress Notes (Signed)
Patient ID: [name], female   DOB: 1923/04/12, 79 y.o.   MRN: [id number]  HPI: [name] is a 79 y.o.-year-old female, returning for f/u for DM2, insulin-dependent, uncontrolled, with complications (CKD, peripheral neuropathy). She is here with her husband.  Patient has been diagnosed with diabetes in 1988; she started insulin 2 years ago. Last hemoglobin A1c was: Lab Results  Component Value Date   HGBA1C 11.7* 11/12/2012  Previously, 7.9%, prev. 11%.  Pt is now on a regimen of: - 70/30 50 units bid  Was previously on:  - Levemir 32 units bid - 5 pens = 45$, but will approach the "gap", so she tells me she cannot really afford this - Regular insulin 12 units - takes it sparsely as she says she does not eat much, but usually bid. She tells me that she developed severe diarrhea from metformin. She still has diarrhea after stopping Metformin, although not as severe. She did not tolerate Lantus b/c rash.  She could not afford her Levemir. She believed NovoLog made her itch >> seen by derm and a skin Bx showed drug allergic rxn.    Pt checks her sugars 2 a day and they are improved (brings a log, which reviewed together): - am: 200-400, largest 353 >> now 174-325 >> now 116-211, however this morning was 261, after last night's CBG of 344 - before lunch: 200-249 now (patient tells me that, even though she calls dyspnea lunch: It is her first meal of the day, but she had to later between 12 and 2 PM) - before dinner: She was in the 300s before, now 97-212 (these are the best sugars ofthe day) - bedtime: 167-344 now Lowest sugars 94 x 1, 97 x 2, all before dinner; she has hypoglycemia awareness at 70. Highest sugar was 344 at bedtime yesterday. She tells me that she a bowl of cereal with milk at bedtime, he was unclear whether the measurement was checked before or after this, or if she actually took the insulin 30 minutes before...   Pt has chronic kidney disease, last BUN/creatinine  was:  Lab Results  Component Value Date   BUN 26* 11/12/2012   CREATININE 1.5* 11/12/2012  Pt's last eye exam was within last year. No DR, reportedly. Denies numbness and tingling in her legs.  PMH: She also has a history of hypothyroidism, HL, HTN, A fib (on coumadin), GERD, IBS, hiatal hernia, BPPV, anemia, cataracts - s/p extraction 2006, vit D and B12 def., osteoporosis-s/p ankle fx p fall - with ORIF 2011.  I reviewed pt's medications, allergies, PMH, social hx, family hx and no changes required, except as mentioned above.  ROS: Constitutional: + weight  loss,+ fatigue, + decreased appetite Eyes: + occ. blurry vision, no xerophthalmia ENT: no sore throat, no nodules palpated in throat, no dysphagia/no odynophagia, no hoarseness Cardiovascular: no CP/SOB/no palpitations/+ legs swelling Respiratory: + cough/SOB Gastrointestinal: no N/V/D/C Musculoskeletal: + muscle/+ joint aches and swelling Skin: no rash, + easy bruising, itching Neurological: no tremors/numbness/tingling/dizziness, occas. HAs Psychiatric: no depression/anxiety  PE: BP 124/68  Pulse 89  Temp(Src) 97.8 F (36.6 C) (Oral)  Resp 16  Ht 5\' 2"  (1.575 m)  SpO2 96%  LMP 09/01/1961 Wt Readings from Last 3 Encounters:  12/05/12 238 lb (107.956 kg)  09/27/12 249 lb (112.946 kg)  07/17/12 254 lb 12 oz (115.554 kg)  Refused being weighed today.  Constitutional: obese, in wheelchair, in NAD Eyes: PERRLA, EOMI, no exophthalmos ENT: moist mucous membranes, no thyromegaly,  no cervical lymphadenopathy Cardiovascular: RRR, No MRG Respiratory: CTA B Gastrointestinal: abdomen soft, NT, ND, BS+ Musculoskeletal: no deformities, strength intact in all 4 Skin: moist, warm, no rashes other than stasis dermatitis lower legs Neurological: no tremor with outstretched hands, DTR normal in all 4  ASSESSMENT: 1. DM2, insulin-dependent, uncontrolled, with complications - CKD - peripheral neuropathy  PLAN:  1. Pt with long  standing DM2, recently more uncontrolled. At last visit, we switched from basal-bolus insulin to Humulin 70/30 50 units twice a day because of price. I sent a prescription for pens to the pharmacy, and she was telling me that she could not draw insulin well in the syringe. The pens are still expensive, so we'll need to switch to vials in the future. - I advised her to inject the insulin 30 minutes before breakfast and dinner, however, she does not have consistent mealtimes, eating the first meal of the day anywhere between 10 AM and 2 PM. Whenever she eats late, her sugars are in the 200s before the meal. If she eats earlier, her sugars are lower before the meal. She has dinner anywhere from 6 to 9 PM and her bedtime is at 11 PM. Her husband tells me that sometimes she sleeps through her meals and has them very late. I discussed with her and her husband that she probably will need to have a more organized meal regimen, especially since we need to use insulin 70/30, which is taken before breakfast and dinner. I advised her to do the following: Have breakfast between 10 and 11 am (which is early for her) Have dinner between 7 and 8 pm. - They agree to try this. - Advised her to increase the amount of fruits and vegetables in her diet, and reduced the amount of fat and highly processed sugars. - Will continue the same insulin regimen for now - I will see her back in 1.5 mo with her sugar log - Will check hemoglobin A1c then

## 2013-02-06 NOTE — Patient Instructions (Addendum)
Please try to wake up every morning before 10 am and have breakfast between 10 and 11 am. If you have a snack for lunch, make this at around 2 pm. Please try to have vegetables and fruit for snacks. Have dinner between 7 and 8 pm. Please check sugars and take your insulin 30 minutes before eating breakfast and dinner. Avoid fatty foods and simple sugars (cake, pie, cookies, candy, etc.) Please return in 1 month with your sugar log.

## 2013-02-06 NOTE — Assessment & Plan Note (Signed)
Another blister on abdomen with evidence of ulceration - wound cx obt and dressed with triple abx and non stick pad  This is more in line with intertrigo - but given recurrence I do wonder if bullous pemphigoid is in the differential  Wound cx pending Sent for last 3 derm notes

## 2013-02-09 LAB — WOUND CULTURE: Gram Stain: NONE SEEN

## 2013-02-11 ENCOUNTER — Ambulatory Visit: Payer: Medicare Other

## 2013-02-11 ENCOUNTER — Telehealth: Payer: Self-pay | Admitting: *Deleted

## 2013-02-11 NOTE — Telephone Encounter (Signed)
Pt advised that Dr. Milinda Antis recommends going to a pain management clinic if her pain is that bad but pt declined pt said she can't afford to go to another doctor, pt said she is trying to get her knee in better condition before going back to her orthopedic doctor, I advised pt that Dr. Milinda Antis is only comfortable prescribing Tramadol 50 mg BID, like she has been prescribing. Pt then said "well I have a lot of Tramadol and I could take a lot more then prescribed if I want to, but I don't" I then recommended pt to call her ortho doc for evaluation since she doesn't want to go to the pain clinic and since doctor Tower isn't going to prescribe anything stronger  I will send for the last 3 derm notes in the AM when the office opens

## 2013-02-11 NOTE — Telephone Encounter (Signed)
I sent for her last 3 derm notes and have not received them yet - please call for them again so I can review and make plan for the blisters 500 mg of tramadol bid would be ten pills bid--if she is really taking that dose she could very well overdose --that is very very dangerous  If she is having that much pain- we need to refer her to a pain clinic who can help out more with pain control - please see if agreeable and I will refer

## 2013-02-11 NOTE — Telephone Encounter (Signed)
Called pt to advise her of her recent wound cx results and pt said her blisters are doing okay but she is having a lot of pain and her med (tramadol) isn't helping at all. Pt said in the past she was taking 500 mg of Tramadol twice a day and that was the only dose that was helping her pain. You are prescribing her tramadol 50 mg BID and pt said it's not touching her pain at all, pt would like a new pain med sent into pharmacy because she is having pain in her ankles, knees, legs and back, pt said she is in so much pain all she can do is lay in bed all day, please advise

## 2013-02-12 NOTE — Telephone Encounter (Signed)
Thanks for the update - if blisters continue I think it is important that she go

## 2013-02-12 NOTE — Telephone Encounter (Signed)
I advise pt of Dr. Royden Purl comments/recommendations, pt declined a referral to a dermatologist, pt said she is in to much pain to move and doesn't want to go to any doctors right now, pt said if she wants the blisters to be evaluated she will call Dr. Dorita Sciara office directly and make an appt

## 2013-02-12 NOTE — Telephone Encounter (Signed)
Thanks  Please let her know that I reviewed the last 3 notes from Dr Dorita Sciara office to see if they mentioned the blisters (they did not)-so this is a new problem I am concerned about the blisters even though they are getting better because I do not know what is causing them For that reason I need to refer her back to derm for a visit for the blisters- let me know if agreeable (I do not have a preference of who she sees there) thanks

## 2013-02-12 NOTE — Telephone Encounter (Signed)
Sent for the last 3 OV notes from Dr. Wylie Hail and placed them in your inbox

## 2013-02-14 ENCOUNTER — Ambulatory Visit (INDEPENDENT_AMBULATORY_CARE_PROVIDER_SITE_OTHER): Payer: Medicare Other | Admitting: Family Medicine

## 2013-02-14 ENCOUNTER — Ambulatory Visit: Payer: Medicare Other

## 2013-02-14 ENCOUNTER — Other Ambulatory Visit: Payer: Self-pay | Admitting: Family Medicine

## 2013-02-14 DIAGNOSIS — Z7901 Long term (current) use of anticoagulants: Secondary | ICD-10-CM

## 2013-02-14 DIAGNOSIS — Z5181 Encounter for therapeutic drug level monitoring: Secondary | ICD-10-CM

## 2013-02-14 DIAGNOSIS — I4891 Unspecified atrial fibrillation: Secondary | ICD-10-CM

## 2013-02-14 NOTE — Telephone Encounter (Signed)
Rout to Dr. Elvera Lennox, since she is managing pt's DM

## 2013-02-18 ENCOUNTER — Other Ambulatory Visit: Payer: Self-pay | Admitting: Dermatology

## 2013-02-19 ENCOUNTER — Other Ambulatory Visit: Payer: Self-pay | Admitting: Family Medicine

## 2013-02-19 MED ORDER — WARFARIN SODIUM 5 MG PO TABS: ORAL_TABLET | ORAL | Status: DC

## 2013-02-25 ENCOUNTER — Ambulatory Visit: Payer: Medicare Other

## 2013-03-07 ENCOUNTER — Encounter (HOSPITAL_COMMUNITY): Payer: Self-pay | Admitting: *Deleted

## 2013-03-07 ENCOUNTER — Inpatient Hospital Stay (HOSPITAL_COMMUNITY)
Admission: EM | Admit: 2013-03-07 | Discharge: 2013-03-11 | DRG: 872 | Disposition: A | Discharge status: Home Health | Payer: Medicare Other | Attending: Internal Medicine | Admitting: Internal Medicine

## 2013-03-07 ENCOUNTER — Emergency Department (HOSPITAL_COMMUNITY): Payer: Medicare Other

## 2013-03-07 DIAGNOSIS — K219 Gastro-esophageal reflux disease without esophagitis: Secondary | ICD-10-CM | POA: Diagnosis present

## 2013-03-07 DIAGNOSIS — L129 Pemphigoid, unspecified: Secondary | ICD-10-CM | POA: Diagnosis present

## 2013-03-07 DIAGNOSIS — E876 Hypokalemia: Secondary | ICD-10-CM | POA: Diagnosis not present

## 2013-03-07 DIAGNOSIS — N893 Dysplasia of vagina, unspecified: Secondary | ICD-10-CM | POA: Diagnosis present

## 2013-03-07 DIAGNOSIS — K449 Diaphragmatic hernia without obstruction or gangrene: Secondary | ICD-10-CM | POA: Diagnosis present

## 2013-03-07 DIAGNOSIS — Z7901 Long term (current) use of anticoagulants: Secondary | ICD-10-CM

## 2013-03-07 DIAGNOSIS — Z87891 Personal history of nicotine dependence: Secondary | ICD-10-CM

## 2013-03-07 DIAGNOSIS — E785 Hyperlipidemia, unspecified: Secondary | ICD-10-CM | POA: Diagnosis present

## 2013-03-07 DIAGNOSIS — D509 Iron deficiency anemia, unspecified: Secondary | ICD-10-CM | POA: Diagnosis present

## 2013-03-07 DIAGNOSIS — E039 Hypothyroidism, unspecified: Secondary | ICD-10-CM | POA: Diagnosis present

## 2013-03-07 DIAGNOSIS — B961 Klebsiella pneumoniae [K. pneumoniae] as the cause of diseases classified elsewhere: Secondary | ICD-10-CM | POA: Diagnosis present

## 2013-03-07 DIAGNOSIS — I10 Essential (primary) hypertension: Secondary | ICD-10-CM | POA: Diagnosis present

## 2013-03-07 DIAGNOSIS — E1142 Type 2 diabetes mellitus with diabetic polyneuropathy: Secondary | ICD-10-CM

## 2013-03-07 DIAGNOSIS — E119 Type 2 diabetes mellitus without complications: Secondary | ICD-10-CM | POA: Diagnosis present

## 2013-03-07 DIAGNOSIS — K649 Unspecified hemorrhoids: Secondary | ICD-10-CM | POA: Diagnosis present

## 2013-03-07 DIAGNOSIS — I4891 Unspecified atrial fibrillation: Secondary | ICD-10-CM | POA: Diagnosis present

## 2013-03-07 DIAGNOSIS — A419 Sepsis, unspecified organism: Principal | ICD-10-CM | POA: Diagnosis present

## 2013-03-07 DIAGNOSIS — K589 Irritable bowel syndrome without diarrhea: Secondary | ICD-10-CM | POA: Diagnosis present

## 2013-03-07 DIAGNOSIS — N10 Acute tubulo-interstitial nephritis: Secondary | ICD-10-CM | POA: Diagnosis present

## 2013-03-07 DIAGNOSIS — T148XXA Other injury of unspecified body region, initial encounter: Secondary | ICD-10-CM | POA: Diagnosis present

## 2013-03-07 DIAGNOSIS — E669 Obesity, unspecified: Secondary | ICD-10-CM | POA: Diagnosis present

## 2013-03-07 DIAGNOSIS — L719 Rosacea, unspecified: Secondary | ICD-10-CM | POA: Diagnosis present

## 2013-03-07 DIAGNOSIS — Z860100 Personal history of colon polyps, unspecified: Secondary | ICD-10-CM

## 2013-03-07 HISTORY — DX: Type 2 diabetes mellitus without complications: E11.9

## 2013-03-07 HISTORY — DX: Iron deficiency anemia, unspecified: D50.9

## 2013-03-07 HISTORY — DX: Malignant neoplasm of vagina: C52

## 2013-03-07 HISTORY — DX: Adverse effect of unspecified anesthetic, initial encounter: T41.45XA

## 2013-03-07 HISTORY — DX: Other complications of anesthesia, initial encounter: T88.59XA

## 2013-03-07 HISTORY — DX: Heart failure, unspecified: I50.9

## 2013-03-07 HISTORY — DX: Shortness of breath: R06.02

## 2013-03-07 HISTORY — DX: Unspecified osteoarthritis, unspecified site: M19.90

## 2013-03-07 LAB — URINALYSIS, ROUTINE W REFLEX MICROSCOPIC
Glucose, UA: 500 mg/dL — AB
Ketones, ur: 15 mg/dL — AB
Nitrite: POSITIVE — AB
Specific Gravity, Urine: 1.023 (ref 1.005–1.030)
pH: 5 (ref 5.0–8.0)

## 2013-03-07 LAB — CBC WITH DIFFERENTIAL/PLATELET
Basophils Absolute: 0 10*3/uL (ref 0.0–0.1)
Basophils Relative: 0 % (ref 0–1)
Eosinophils Absolute: 0 10*3/uL (ref 0.0–0.7)
HCT: 41.2 % (ref 36.0–46.0)
Hemoglobin: 14.2 g/dL (ref 12.0–15.0)
Lymphocytes Relative: 5 % — ABNORMAL LOW (ref 12–46)
MCHC: 34.5 g/dL (ref 30.0–36.0)
Monocytes Relative: 7 % (ref 3–12)
Neutro Abs: 22.3 10*3/uL — ABNORMAL HIGH (ref 1.7–7.7)
Neutrophils Relative %: 88 % — ABNORMAL HIGH (ref 43–77)
RDW: 13.9 % (ref 11.5–15.5)
WBC: 25.4 10*3/uL — ABNORMAL HIGH (ref 4.0–10.5)

## 2013-03-07 LAB — URINE MICROSCOPIC-ADD ON

## 2013-03-07 LAB — COMPREHENSIVE METABOLIC PANEL
ALT: 12 U/L (ref 0–35)
AST: 17 U/L (ref 0–37)
Alkaline Phosphatase: 64 U/L (ref 39–117)
CO2: 23 mEq/L (ref 19–32)
Chloride: 96 mEq/L (ref 96–112)
GFR calc Af Amer: 44 mL/min — ABNORMAL LOW (ref >90)
GFR calc non Af Amer: 38 mL/min — ABNORMAL LOW (ref >90)
Glucose, Bld: 274 mg/dL — ABNORMAL HIGH (ref 70–99)
Potassium: 4.2 mEq/L (ref 3.5–5.1)
Sodium: 135 mEq/L (ref 135–145)

## 2013-03-07 LAB — GLUCOSE, CAPILLARY
Glucose-Capillary: 258 mg/dL — ABNORMAL HIGH (ref 70–99)
Glucose-Capillary: 261 mg/dL — ABNORMAL HIGH (ref 70–99)
Glucose-Capillary: 248 mg/dL — ABNORMAL HIGH (ref 70–99)

## 2013-03-07 LAB — POCT I-STAT 3, ART BLOOD GAS (G3+)
Bicarbonate: 24.8 mEq/L — ABNORMAL HIGH (ref 20.0–24.0)
Patient temperature: 101.3
pCO2 arterial: 41.9 mmHg (ref 35.0–45.0)
pH, Arterial: 7.386 (ref 7.350–7.450)
pO2, Arterial: 76 mmHg — ABNORMAL LOW (ref 80.0–100.0)

## 2013-03-07 LAB — PROTIME-INR: INR: 2.08 — ABNORMAL HIGH (ref 0.00–1.49)

## 2013-03-07 LAB — PHOSPHORUS: Phosphorus: 3.7 mg/dL (ref 2.3–4.6)

## 2013-03-07 LAB — CG4 I-STAT (LACTIC ACID): Lactic Acid, Venous: 2.72 mmol/L — ABNORMAL HIGH (ref 0.5–2.2)

## 2013-03-07 LAB — TROPONIN I: Troponin I: <0.3 ng/mL (ref <0.30)

## 2013-03-07 LAB — PRO B NATRIURETIC PEPTIDE: Pro B Natriuretic peptide (BNP): 7836 pg/mL — ABNORMAL HIGH (ref 0–450)

## 2013-03-07 MED ORDER — LEVOTHYROXINE SODIUM 150 MCG PO TABS
150.0000 ug | ORAL_TABLET | Freq: Every day | ORAL | Status: DC
  Administered 2013-03-08 – 2013-03-11 (×4): 150 ug via ORAL
  Filled 2013-03-07 (×5): qty 1

## 2013-03-07 MED ORDER — WARFARIN SODIUM 2.5 MG PO TABS: 2.5000 mg | ORAL_TABLET | ORAL | Status: DC

## 2013-03-07 MED ORDER — WARFARIN SODIUM 2.5 MG PO TABS
2.5000 mg | ORAL_TABLET | Freq: Once | ORAL | Status: AC
  Administered 2013-03-07: 2.5 mg via ORAL
  Filled 2013-03-07: qty 1

## 2013-03-07 MED ORDER — SODIUM CHLORIDE 0.9 % IV SOLN
INTRAVENOUS | Status: AC
  Administered 2013-03-07: 18:00:00 via INTRAVENOUS

## 2013-03-07 MED ORDER — NYSTATIN 100000 UNIT/GM EX POWD
Freq: Two times a day (BID) | CUTANEOUS | Status: DC
  Administered 2013-03-07 – 2013-03-10 (×6): via TOPICAL
  Administered 2013-03-11: 1 g via TOPICAL
  Filled 2013-03-07: qty 15

## 2013-03-07 MED ORDER — ONDANSETRON HCL 4 MG PO TABS: 4.0000 mg | ORAL_TABLET | Freq: Four times a day (QID) | ORAL | Status: DC | PRN

## 2013-03-07 MED ORDER — HYDROMORPHONE HCL PF 1 MG/ML IJ SOLN: 0.5000 mg | Freq: Four times a day (QID) | INTRAMUSCULAR | Status: DC | PRN

## 2013-03-07 MED ORDER — ACETAMINOPHEN 650 MG RE SUPP: 650.0000 mg | Freq: Four times a day (QID) | RECTAL | Status: DC | PRN

## 2013-03-07 MED ORDER — DILTIAZEM HCL 100 MG IV SOLR
5.0000 mg/h | INTRAVENOUS | Status: AC
  Filled 2013-03-07: qty 100

## 2013-03-07 MED ORDER — SODIUM CHLORIDE 0.9 % IV SOLN
1000.0000 mL | INTRAVENOUS | Status: DC
  Administered 2013-03-07: 1000 mL via INTRAVENOUS

## 2013-03-07 MED ORDER — PIPERACILLIN-TAZOBACTAM 3.375 G IVPB 30 MIN
3.3750 g | INTRAVENOUS | Status: AC
  Administered 2013-03-07: 3.375 g via INTRAVENOUS
  Filled 2013-03-07: qty 50

## 2013-03-07 MED ORDER — SODIUM CHLORIDE 0.9 % IV SOLN
1000.0000 mL | Freq: Once | INTRAVENOUS | Status: AC
  Administered 2013-03-07: 1000 mL via INTRAVENOUS

## 2013-03-07 MED ORDER — INSULIN ASPART 100 UNIT/ML ~~LOC~~ SOLN
0.0000 [IU] | Freq: Three times a day (TID) | SUBCUTANEOUS | Status: DC
  Administered 2013-03-07: 11 [IU] via SUBCUTANEOUS
  Administered 2013-03-08 (×2): 5 [IU] via SUBCUTANEOUS

## 2013-03-07 MED ORDER — ACETAMINOPHEN 325 MG PO TABS
650.0000 mg | ORAL_TABLET | Freq: Four times a day (QID) | ORAL | Status: DC | PRN
  Administered 2013-03-09: 650 mg via ORAL
  Filled 2013-03-07: qty 2

## 2013-03-07 MED ORDER — PIPERACILLIN-TAZOBACTAM 3.375 G IVPB 30 MIN
3.3750 g | Freq: Three times a day (TID) | INTRAVENOUS | Status: DC
  Administered 2013-03-07 – 2013-03-08 (×2): 3.375 g via INTRAVENOUS
  Filled 2013-03-07 (×5): qty 50

## 2013-03-07 MED ORDER — ONDANSETRON HCL 4 MG/2ML IJ SOLN: 4.0000 mg | Freq: Four times a day (QID) | INTRAMUSCULAR | Status: DC | PRN

## 2013-03-07 MED ORDER — LEVALBUTEROL HCL 0.63 MG/3ML IN NEBU
0.6300 mg | INHALATION_SOLUTION | Freq: Four times a day (QID) | RESPIRATORY_TRACT | Status: DC
  Administered 2013-03-07 – 2013-03-09 (×7): 0.63 mg via RESPIRATORY_TRACT
  Filled 2013-03-07 (×14): qty 3

## 2013-03-07 MED ORDER — METOPROLOL TARTRATE 1 MG/ML IV SOLN
5.0000 mg | Freq: Once | INTRAVENOUS | Status: AC
  Administered 2013-03-07: 5 mg via INTRAVENOUS
  Filled 2013-03-07: qty 5

## 2013-03-07 MED ORDER — SODIUM CHLORIDE 0.9 % IV SOLN
1500.0000 mg | INTRAVENOUS | Status: DC
  Administered 2013-03-08 – 2013-03-09 (×2): 1500 mg via INTRAVENOUS
  Filled 2013-03-07 (×3): qty 1500

## 2013-03-07 MED ORDER — INSULIN ASPART PROT & ASPART (70-30 MIX) 100 UNIT/ML ~~LOC~~ SUSP
25.0000 [IU] | Freq: Two times a day (BID) | SUBCUTANEOUS | Status: DC
  Administered 2013-03-08 – 2013-03-11 (×7): 25 [IU] via SUBCUTANEOUS
  Filled 2013-03-07: qty 10

## 2013-03-07 MED ORDER — VANCOMYCIN HCL 10 G IV SOLR
2000.0000 mg | INTRAVENOUS | Status: AC
  Administered 2013-03-07: 2000 mg via INTRAVENOUS
  Filled 2013-03-07: qty 2000

## 2013-03-07 NOTE — ED Notes (Signed)
Pt lethargic, sleepy, slow to respond. Able to answer simple questions appropriately, c/o not feeling good & generalized pain "all over". States emesis x1 last night. Denies cold, cough, abd pain. Pt poor historian. Pt son arrived & reports has history of UTI's & was acting her normal self yesterday but last night did say she was not feeling well.

## 2013-03-07 NOTE — ED Notes (Signed)
Husband reports at 1100 noticed pt lethargic & with altered mental status. Husband last saw pt at 0300 when she said she wasn't feeling good. Per EMS - pt incontinent of urine & stool. Pt found to be in A fib with RVR. Pt family reports pt has an autoimmune skin disorder & suffers from open wounds all over body.

## 2013-03-07 NOTE — ED Notes (Signed)
Admitting MD at bedside.

## 2013-03-07 NOTE — ED Provider Notes (Signed)
CSN: [id number]     Arrival date & time 03/07/13  1229 History     First MD Initiated Contact with Patient 03/07/13 1231     No chief complaint on file.  (Consider location/radiation/quality/duration/timing/severity/associated sxs/prior Treatment) The history is provided by the patient. The history is limited by the condition of the patient.    [name] is a(n) 79 y.o. female who presents the EMS for altered mental status.  The patient has a history of chronic atrial fibrillation with long-term Coumadin use diabetes on insulin, and an unknown autoimmune bolus rash for which she is currently taking prednisone.  There is a level V tab twice a day 2 altered mental status.  Per notes from EMS the patient lives at home and her only take caretaker if her 61 year old husband who noticed that she was somnolent today and called EMS.  EMS states that she was incontinent of stool and urine which was typically takes to her back and legs and had been there for some time.  They found her to be in atrial fib with intermittent RVR up into the 160s. Upon questioning the patient states that "I don't feel good and I hurt all over."  She is intermittently somnolent but will respond when spoken to loudly.  Patient states he also has difficulty with hearing.  She cannot specify her pain.  She is disoriented to place, time and event.     Past Medical History  Diagnosis Date  . Atrial fibrillation   . Diabetes mellitus     type II  . Anemia     Fe deficiency  . GERD (gastroesophageal reflux disease)   . Hyperlipidemia   . Hypertension   . Hypothyroid   . Obesity   . Vaginal dysplasia 1980  . Rosacea   . Chest pain 11/2002    cardiac cath neg   . Back pain   . IBS (irritable bowel syndrome)     with diarrhea  . Asthma     as a child  . Adenomatous colon polyp 12/2007  . Hemorrhoids   . Vertigo   . Hiatal hernia 12/2007    EGD   Past Surgical History  Procedure Laterality Date  .  Abdominal hysterectomy  1963  . Cholecystectomy  2006  . Knee surgery      Rt TKR 1993 & Lt TKR 2001  . Cervical cone biopsy  1963    D&C  . Appendectomy  1941  . Cystocele repair  1978    /rectocele  . Orif ankle fracture  01/2010    fall/followed by rehab stay  . Cataract extraction  2006    bilateral   Family History  Problem Relation Age of Onset  . Cancer Mother     leukemia  . Coronary artery disease Mother   . Dementia Mother   . Heart disease Father     CAD  . COPD Sister   . Heart disease Brother   . Diabetes Brother   . Kidney disease Son   . Colon cancer Brother    History  Substance Use Topics  . Smoking status: Former Games developer  . Smokeless tobacco: Never Used  . Alcohol Use: No   OB History   Grav Para Term Preterm Abortions TAB SAB Ect Mult Living                 Review of Systems  Unable to perform ROS: Mental status change    Allergies  Ciprofloxacin;  Gabapentin; Insulin glargine; Latex; Metformin and related; Metoclopramide hcl; Nsaids; and Promethazine hcl  Home Medications   Current Outpatient Rx  Name  Route  Sig  Dispense  Refill  . acetaminophen (TYLENOL) 500 MG tablet   Oral   Take 500 mg by mouth every 6 (six) hours as needed for pain.         . BD PEN NEEDLE NANO U/F 32G X 4 MM MISC      USE AS DIRECTED WITH INSULIN PEN   100 each   1   . furosemide (LASIX) 20 MG tablet   Oral   Take 1 tablet (20 mg total) by mouth 2 (two) times daily.   60 tablet   0   . glucose blood (ONE TOUCH ULTRA TEST) test strip      Check sugars 2x a day   200 each   11   . HUMULIN R 100 UNIT/ML injection               . insulin NPH-regular (HUMULIN 70/30) (70-30) 100 UNIT/ML injection      Inject under skin 50 units 30 min before breakfast and dinner. Pens please.   30 mL   5   . Insulin Syringe-Needle U-100 (B-D INS SYRINGE 0.5CC/30GX1/2") 30G X 1/2" 0.5 ML MISC      Inject 2-3 x a day   100 each   11   . levothyroxine  (SYNTHROID, LEVOTHROID) 150 MCG tablet   Oral   Take 1 tablet (150 mcg total) by mouth daily.   90 tablet   1   . loperamide (IMODIUM) 2 MG capsule   Oral   Take 2 mg by mouth 4 (four) times daily as needed for diarrhea or loose stools.         . meclizine (ANTIVERT) 25 MG tablet   Oral   Take 25 mg by mouth 3 (three) times daily as needed.         . metoprolol (LOPRESSOR) 50 MG tablet   Oral   Take 0.5 tablets (25 mg total) by mouth 2 (two) times daily.   90 tablet   1   . omeprazole (PRILOSEC) 20 MG capsule   Oral   Take 1 capsule (20 mg total) by mouth daily.   90 capsule   1   . potassium chloride SA (K-DUR,KLOR-CON) 20 MEQ tablet   Oral   Take 1 tablet (20 mEq total) by mouth daily.   90 tablet   1   . silver sulfADIAZINE (SILVADENE) 1 % cream   Topical   Apply topically daily.   50 g   0   . simvastatin (ZOCOR) 40 MG tablet   Oral   Take 1 tablet (40 mg total) by mouth at bedtime.   90 tablet   1   . traMADol (ULTRAM) 50 MG tablet      1-2 pills by mouth up to three times daily as needed for severe pain   540 tablet   3   . warfarin (COUMADIN) 1 MG tablet      Take as directed by [facility].   30 tablet   0   . warfarin (COUMADIN) 5 MG tablet      Take as directed by [facility].   75 tablet   0     90 day supply    LMP 09/01/1961 Physical Exam  Nursing note and vitals reviewed. Constitutional: No distress.  HENT:  Head: Normocephalic and atraumatic.  Dried vomitus caked to the side of her mouth  Eyes: Conjunctivae are normal.  Neck: No JVD present. No tracheal deviation present.  Cardiovascular: Intact distal pulses.   A fib, rate between 90s and 120s on monitor.  Pulmonary/Chest: No respiratory distress.  Crackles and ronchi in lung bases.  Abdominal: Soft. She exhibits no distension. There is no tenderness.  Skin: She is not diaphoretic.  Multiple Bullae in various stages of healing.  Large Intact Bulla on the R  lower extremity.  Large open bulla on the R knee. RLE warm and red. Intertrigo of Breasts and abdominal folds with non-blanchable petechiae. Bruising in various stages of healing      ED Course   Procedures (including critical care time)  Labs Reviewed - No data to display No results found. 1. Sepsis     Date: 03/09/2013  Rate: 125  Rhythm: atrial fibrillation  QRS Axis: normal  Intervals: normal  ST/T Wave abnormalities: normal  Conduction Disutrbances:none  Narrative Interpretation:   Old EKG Reviewed: unchanged    MDM  Patient here with concern for sepsis. Multiple sources possible: cellulits, uti, pneumonia  She has a long hx of A fib and chronic anticoagulation.  She is tachycardic, intermittent RVR response to her atrial fibrillation.  She appears to have stable blood pressure at this time.    3:16 PM Filed Vitals:   03/07/13 1315 03/07/13 1345 03/07/13 1400 03/07/13 1430  BP: 121/62 129/67 134/72 140/87  Pulse: 114 116 146 121  Temp:      TempSrc:      Resp: 27 27 27 29   SpO2: 100% 99% 99% 99%   Patient 02 saturations on 2L 100% despite tachypnea.  Lactate 2.72,  Urine positive.  Concern for CAP on xray, considering dried vomitus on the the patient's face I feel that aspiration pneumonia is plausible. Exam  Also concerning for developing cellulitis of the RLE. Patient has been started on vanc/zosyn.   3:34 PM I have spoken with Dr. Susie Cassette who will admit the patient for sepsis to the stepdown unit. She appears hemodynamically stable. Dr. Susie Cassette requests ct RLE and ABG   Arthor Captain, PA-C 03/09/13 2149

## 2013-03-07 NOTE — ED Notes (Signed)
First antibiotic started after only 1 blood culture obtained. Awaiting phlebotomy to draw second

## 2013-03-07 NOTE — ED Notes (Signed)
Patient transported to CT 

## 2013-03-07 NOTE — ED Notes (Signed)
Lactic acid results called to nurse Clydie Braun

## 2013-03-07 NOTE — Progress Notes (Signed)
ANTICOAGULATION CONSULT NOTE - Initial Consult  Pharmacy Consult for Warfarin Indication: atrial fibrillation  Allergies  Allergen Reactions  . Ciprofloxacin     Nausea Possibly rash and itching- but not sure   . Gabapentin Nausea And Vomiting  . Insulin Glargine     Itching   . Latex     Rash   . Metformin And Related     Severe diarrhea   . Metoclopramide Hcl     REACTION: unspecified  . Nsaids     REACTION: unspecified  . Promethazine Hcl     REACTION: unspecified    Patient Measurements: Height: 5\' 2"  (157.5 cm) Weight: 240 lb 11.9 oz (109.2 kg) IBW/kg (Calculated) : 50.1  Vital Signs: Temp: 98 F (36.7 C) (08/07 1713) Temp src: Oral (08/07 1713) BP: 124/58 mmHg (08/07 1705) Pulse Rate: 96 (08/07 1705)  Labs:  Recent Labs  03/07/13 1341  HGB 14.2  HCT 41.2  PLT 240  LABPROT 22.7*  INR 2.08*  CREATININE 1.23*    Estimated Creatinine Clearance: 36.8 ml/min (by C-G formula based on Cr of 1.23).   Medical History: Past Medical History  Diagnosis Date  . Atrial fibrillation   . Diabetes mellitus     type II  . Anemia     Fe deficiency  . GERD (gastroesophageal reflux disease)   . Hyperlipidemia   . Hypertension   . Hypothyroid   . Obesity   . Vaginal dysplasia 1980  . Rosacea   . Chest pain 11/2002    cardiac cath neg   . Back pain   . IBS (irritable bowel syndrome)     with diarrhea  . Asthma     as a child  . Adenomatous colon polyp 12/2007  . Hemorrhoids   . Vertigo   . Hiatal hernia 12/2007    EGD    Assessment: 79 y.o. F who presented to the [facility] on 03/07/13 with AMS and fevers and started on Vancomycin + Zosyn for empiric coverage. The patient was on warfarin PTA for hx Afib and admitted with a therapeutic INR of 2.08 on a home dose of 2.5 mg daily EXCEPT for 5 mg on Mondays only. Pharmacy was consulted to resume warfarin dosing this admission. CBC wnl, no bleeding noted.   The patient has been started on broad spectrum  antibiotics and is currently NPO -- both of which will increase warfarin sensitivity. Will plan to dose warfarin cautiously in this setting however given INR on lower end of range -- will proceed with home dose this evening.   Goal of Therapy:  INR 2-3  Plan:  1. Warfarin 2.5 mg x 1 dose at 1800 today 2. Daily PT/INR 3. Will continue to monitor for any signs/symptoms of bleeding and will follow up with PT/INR in the a.m.   Georgina Pillion, PharmD, BCPS Clinical Pharmacist Pager: [phone] 03/07/2013 5:36 PM

## 2013-03-07 NOTE — Progress Notes (Signed)
Pt arrived on unit during 1500 hour alert and oriented.  Nurse was informed by ED nurse that both blood cultures and urine analysis were obtained in ED and already sent to lab.  MD at bedside and informed nurse to not begin Cardizem drip unless pt experienced sustained HR >120.  Nurse will continue to monitor

## 2013-03-07 NOTE — ED Notes (Signed)
Victorino Sparrow [Son] stated if need him Call [phone]

## 2013-03-07 NOTE — Progress Notes (Signed)
ANTIBIOTIC CONSULT NOTE - INITIAL  Pharmacy Consult for Zosyn and Vancomycin Indication: Fever, r/o sepsis  Allergies  Allergen Reactions  . Ciprofloxacin     Nausea Possibly rash and itching- but not sure   . Gabapentin Nausea And Vomiting  . Insulin Glargine     Itching   . Latex     Rash   . Metformin And Related     Severe diarrhea   . Metoclopramide Hcl     REACTION: unspecified  . Nsaids     REACTION: unspecified  . Promethazine Hcl     REACTION: unspecified    Patient Measurements:   Ht: 62 in Wt: ~107 kg  Vital Signs: Temp: 102 F (38.9 C) (08/07 1244) Temp src: Rectal (08/07 1244) BP: 110/91 mmHg (08/07 1244) Pulse Rate: 102 (08/07 1244) Intake/Output from previous day:   Intake/Output from this shift:    Labs: No results found for this basename: WBC, HGB, PLT, LABCREA, CREATININE,  in the last 72 hours The CrCl is unknown because both a height and weight (above a minimum accepted value) are required for this calculation. No results found for this basename: VANCOTROUGH, Leodis Binet, VANCORANDOM, GENTTROUGH, GENTPEAK, GENTRANDOM, TOBRATROUGH, TOBRAPEAK, TOBRARND, AMIKACINPEAK, AMIKACINTROU, AMIKACIN,  in the last 72 hours   Microbiology: Recent Results (from the past 720 hour(s))  WOUND CULTURE     Status: None   Collection Time    02/06/13  4:37 PM      Result Value Range Status   Gram Stain No WBC Seen   Final   Gram Stain No Squamous Epithelial Cells Seen   Final   Gram Stain Rare Gram Positive Cocci In Pairs   Final   Organism ID, Bacteria Moderate GROUP B STREP ([email or web address]) ISOLATED   Final   Comment: Testing against S. agalactiae not routinely     performed due to predictability of     AMP/PEN/VAN susceptibility.    Medical History: Past Medical History  Diagnosis Date  . Atrial fibrillation   . Diabetes mellitus     type II  . Anemia     Fe deficiency  . GERD (gastroesophageal reflux disease)   . Hyperlipidemia   . Hypertension    . Hypothyroid   . Obesity   . Vaginal dysplasia 1980  . Rosacea   . Chest pain 11/2002    cardiac cath neg   . Back pain   . IBS (irritable bowel syndrome)     with diarrhea  . Asthma     as a child  . Adenomatous colon polyp 12/2007  . Hemorrhoids   . Vertigo   . Hiatal hernia 12/2007    EGD    Medications:  See electronic med rec  Assessment: 79 y.o. female presents with AMS and fever. To begin broad spectrum antibiotics for fever and r/o sepsis. LA 2.72. Wbc 25.4. Scr 1.23. est CrCl 36 ml/min.  Goal of Therapy:  Vancomycin trough level 15-20 mcg/ml  Plan:  1. Zosyn 3.375gm IV now over 30 minutes then 3.375gm IV q8h (subsequent doses over 4 hours) 2. Vancomycin 2 gm IV now then 1500mg  IV q24h. 3. Will f/u microbiological data, renal function, and pt's clinical condition  Christoper Fabian, PharmD, BCPS Clinical pharmacist, pager [phone] 03/07/2013,1:06 PM

## 2013-03-07 NOTE — H&P (Addendum)
[facility] History and Physical  [name] ZOX:[id number] DOB: [birth year] DOA: 03/07/2013  Referring physician:   PCP: [name], MD   Chief Complaint: Altered mental status  HPI:  79 year old female presented to the ER with lethargy and altered mental status. Very poor historian. According to the ED notes the patient has not been feeling well for the last couple of days in February the patient started developing skin rash is described as pemphigoid-like lesions with blistering and bleeding from them because of the fact that the patient is on long-term anticoagulation due to a atrial fibrillation. Upon presentation the patient was also found to be in A. fib with RVR with heart rate in the 140s.. She had one episode of vomiting this morning and 1 last night.c/o not feeling good & generalized pain "all over Workup in the ER revealed an elevated lactic acid white count of greater than 25,000, she is being admitted for sepsis.      Review of Systems: negative for the following   As in history of present illness      Past Medical History  Diagnosis Date  . Atrial fibrillation   . Diabetes mellitus     type II  . Anemia     Fe deficiency  . GERD (gastroesophageal reflux disease)   . Hyperlipidemia   . Hypertension   . Hypothyroid   . Obesity   . Vaginal dysplasia 1980  . Rosacea   . Chest pain 11/2002    cardiac cath neg   . Back pain   . IBS (irritable bowel syndrome)     with diarrhea  . Asthma     as a child  . Adenomatous colon polyp 12/2007  . Hemorrhoids   . Vertigo   . Hiatal hernia 12/2007    EGD     Past Surgical History  Procedure Laterality Date  . Abdominal hysterectomy  1963  . Cholecystectomy  2006  . Knee surgery      Rt TKR 1993 & Lt TKR 2001  . Cervical cone biopsy  1963    D&C  . Appendectomy  1941  . Cystocele repair  1978    /rectocele  . Orif ankle fracture  01/2010    fall/followed by rehab stay  . Cataract extraction   2006    bilateral      Social History:  reports that she has quit smoking. She has never used smokeless tobacco. She reports that she does not drink alcohol or use illicit drugs.    Allergies  Allergen Reactions  . Ciprofloxacin     Nausea Possibly rash and itching- but not sure   . Gabapentin Nausea And Vomiting  . Insulin Glargine     Itching   . Latex     Rash   . Metformin And Related     Severe diarrhea   . Metoclopramide Hcl     REACTION: unspecified  . Nsaids     REACTION: unspecified  . Promethazine Hcl     REACTION: unspecified    Family History  Problem Relation Age of Onset  . Cancer Mother     leukemia  . Coronary artery disease Mother   . Dementia Mother   . Heart disease Father     CAD  . COPD Sister   . Heart disease Brother   . Diabetes Brother   . Kidney disease Son   . Colon cancer Brother      Prior to Admission medications  Medication Sig Start Date End Date Taking? Authorizing Provider  acetaminophen (TYLENOL) 500 MG tablet Take 500 mg by mouth every 6 (six) hours as needed for pain. For pain   Yes Historical Provider, MD  insulin NPH-regular (HUMULIN 70/30) (70-30) 100 UNIT/ML injection Inject under skin 50 units 30 min before breakfast and dinner. Pens please. 01/09/13  Yes Carlus Pavlov, MD  levothyroxine (SYNTHROID, LEVOTHROID) 150 MCG tablet Take 1 tablet (150 mcg total) by mouth daily. 11/15/12  Yes Judy Pimple, MD  meclizine (ANTIVERT) 25 MG tablet Take 25 mg by mouth 3 (three) times daily as needed. For dizziness   Yes Historical Provider, MD  metoprolol (LOPRESSOR) 50 MG tablet Take 0.5 tablets (25 mg total) by mouth 2 (two) times daily. 11/15/12  Yes Judy Pimple, MD  omeprazole (PRILOSEC) 20 MG capsule Take 1 capsule (20 mg total) by mouth daily. 11/15/12  Yes Judy Pimple, MD  potassium chloride SA (K-DUR,KLOR-CON) 20 MEQ tablet Take 1 tablet (20 mEq total) by mouth daily. 11/15/12  Yes Judy Pimple, MD  PREDNISONE PO Take 4  tablets by mouth daily. Tapered dosing   Yes Historical Provider, MD  silver sulfADIAZINE (SILVADENE) 1 % cream Apply topically daily. 02/04/13  Yes Joaquim Nam, MD  simvastatin (ZOCOR) 40 MG tablet Take 1 tablet (40 mg total) by mouth at bedtime. 11/15/12 11/15/13 Yes Judy Pimple, MD  traMADol (ULTRAM) 50 MG tablet 1-2 pills by mouth up to three times daily as needed for severe pain 01/10/13  Yes Judy Pimple, MD  warfarin (COUMADIN) 5 MG tablet Take 2.5-5 mg by mouth See admin instructions. Takes 5mg  on Monday & 2.5mg  on all other days 02/19/13  Yes Judy Pimple, MD  BD PEN NEEDLE NANO U/F 32G X 4 MM MISC USE AS DIRECTED WITH INSULIN PEN 01/16/13   Judy Pimple, MD  glucose blood (ONE TOUCH ULTRA TEST) test strip Check sugars 2x a day 02/14/13   Carlus Pavlov, MD  Insulin Syringe-Needle U-100 (B-D INS SYRINGE 0.5CC/30GX1/2") 30G X 1/2" 0.5 ML MISC Inject 2-3 x a day 12/05/12   Carlus Pavlov, MD     Physical Exam: Filed Vitals:   03/07/13 1530 03/07/13 1600 03/07/13 1705 03/07/13 1713  BP: 148/100 131/74 124/58   Pulse: 116 116 96   Temp:    98 F (36.7 C)  TempSrc:   Oral Oral  Resp: 18 25 17    Height:   5\' 2"  (1.575 m)   Weight:   109.2 kg (240 lb 11.9 oz)   SpO2: 99% 98% 99%      Constitutional: Vital signs reviewed. Patient is a well-developed and well-nourished in no acute distress and cooperative with exam. Alert and oriented x3.  Head: Normocephalic and atraumatic  Dried vomitus caked to the side of her mouth  Eyes: Conjunctivae are normal.  Neck: No JVD present. No tracheal deviation present.  Cardiovascular: Intact distal pulses.  A fib, rate between 90s and 120s on monitor.  Pulmonary/Chest: No respiratory distress.  Crackles and ronchi in lung bases.  Abdominal: Soft. She exhibits no distension. There is no tenderness.  Skin: She is not diaphoretic.  Multiple Bullae in various stages of healing.  Large Intact Bulla on the R lower extremity.  Large open bulla on  the R knee. RLE warm and red. Intertrigo of Breasts and abdominal folds with non-blanchable petechiae. Bruising in various stages of healing         Labs on Admission:  Basic Metabolic Panel:  Recent Labs Lab 03/07/13 1341  NA 135  K 4.2  CL 96  CO2 23  GLUCOSE 274*  BUN 30*  CREATININE 1.23*  CALCIUM 9.7   Liver Function Tests:  Recent Labs Lab 03/07/13 1341  AST 17  ALT 12  ALKPHOS 64  BILITOT 1.6*  PROT 7.0  ALBUMIN 3.1*   No results found for this basename: LIPASE, AMYLASE,  in the last 168 hours No results found for this basename: AMMONIA,  in the last 168 hours CBC:  Recent Labs Lab 03/07/13 1341  WBC 25.4*  NEUTROABS 22.3*  HGB 14.2  HCT 41.2  MCV 93.6  PLT 240   Cardiac Enzymes: No results found for this basename: CKTOTAL, CKMB, CKMBINDEX, TROPONINI,  in the last 168 hours  BNP (last 3 results)  Recent Labs  06/16/12 1948 06/20/12 0325  PROBNP 3124.0* 3396.0*      CBG:  Recent Labs Lab 03/07/13 1325 03/07/13 1729  GLUCAP 258* 315*    Radiological Exams on Admission: Ct Head Wo Contrast  03/07/2013   *RADIOLOGY REPORT*  Clinical Data: Altered mental status, fever  CT HEAD WITHOUT CONTRAST  Technique:  Contiguous axial images were obtained from the base of the skull through the vertex without contrast.  Comparison: 05/22/2012  Findings: Generalized atrophy with enlargement of the ventricles and subarachnoid space.  This is unchanged.  Moderate chronic microvascular ischemic type changes in the white matter also unchanged.  Negative for acute infarct, hemorrhage, or mass.  No edema or midline shift.  Calvarium is intact.  IMPRESSION: Atrophy and chronic microvascular ischemia.  No acute abnormality.   Original Report Authenticated By: Janeece Riggers, M.D.   Dg Chest Port 1 View  03/07/2013   *RADIOLOGY REPORT*  Clinical Data: Mental status.  Fever.  PORTABLE CHEST - 1 VIEW  Comparison: 06/17/2012.  Findings: 1206 hours.  There are low  lung volumes with new patchy left lower lobe air space disease.  The previously demonstrated left upper lobe infiltrate has resolved.  The heart size and mediastinal contours are stable.  There is diffuse aortic atherosclerosis.  IMPRESSION: New patchy left lower lobe atelectasis or infiltrate, possibly on the basis of aspiration.   Original Report Authenticated By: Carey Bullocks, M.D.    EKG: Independently reviewed.   Assessment/Plan Active Problems:   * No active hospital problems. *  Sepsis Possible sources of infection are  blistering of the skin throughout her body, UTI, possible aspiration pneumonia in the setting of altered mental status Biopsy done on 7/21 results unavailable  started on broad-spectrum antibiotics namely vancomycin and Zosyn Patient also is extensive maceration of the skin Blood culture and fungal culture obtained Admitted to step down   Atrial fibrillation Given one dose of IV metoprolol Diltiazem drip if needed Cycle cardiac enzymes   Hypothyroidism continue Synthroid Check TSH   Diabetes mellitus Continue home regimen of insulin Accu-Cheks every 4 hours  Code Status:   full Family Communication: bedside Disposition Plan: admit   Time spent: 70 mins   [facility] Pager [phone]  If 7PM-7AM, please contact night-coverage [email or web address] Password [email or web address] 03/07/2013, 5:34 PM

## 2013-03-08 ENCOUNTER — Inpatient Hospital Stay (HOSPITAL_COMMUNITY): Payer: Medicare Other

## 2013-03-08 ENCOUNTER — Encounter (HOSPITAL_COMMUNITY): Payer: Self-pay | Admitting: Radiology

## 2013-03-08 LAB — CBC
HCT: 35.7 % — ABNORMAL LOW (ref 36.0–46.0)
MCH: 32.1 pg (ref 26.0–34.0)
MCV: 95.5 fL (ref 78.0–100.0)
Platelets: 186 10*3/uL (ref 150–400)
RDW: 14.4 % (ref 11.5–15.5)

## 2013-03-08 LAB — PROTIME-INR
INR: 2.06 — ABNORMAL HIGH (ref 0.00–1.49)
INR: 2.09 — ABNORMAL HIGH (ref 0.00–1.49)
Prothrombin Time: 22.6 seconds — ABNORMAL HIGH (ref 11.6–15.2)
Prothrombin Time: 22.8 seconds — ABNORMAL HIGH (ref 11.6–15.2)

## 2013-03-08 LAB — COMPREHENSIVE METABOLIC PANEL
ALT: 9 U/L (ref 0–35)
AST: 12 U/L (ref 0–37)
Albumin: 2.4 g/dL — ABNORMAL LOW (ref 3.5–5.2)
Alkaline Phosphatase: 59 U/L (ref 39–117)
Chloride: 105 mEq/L (ref 96–112)
Potassium: 3.7 mEq/L (ref 3.5–5.1)
Sodium: 140 mEq/L (ref 135–145)
Total Bilirubin: 0.9 mg/dL (ref 0.3–1.2)
Total Protein: 5.9 g/dL — ABNORMAL LOW (ref 6.0–8.3)

## 2013-03-08 LAB — GLUCOSE, CAPILLARY: Glucose-Capillary: 245 mg/dL — ABNORMAL HIGH (ref 70–99)

## 2013-03-08 MED ORDER — ATORVASTATIN CALCIUM 20 MG PO TABS
20.0000 mg | ORAL_TABLET | Freq: Every day | ORAL | Status: DC
  Administered 2013-03-08 – 2013-03-10 (×3): 20 mg via ORAL
  Filled 2013-03-08 (×4): qty 1

## 2013-03-08 MED ORDER — HYDROXYZINE HCL 10 MG PO TABS
10.0000 mg | ORAL_TABLET | ORAL | Status: DC | PRN
  Administered 2013-03-08 – 2013-03-10 (×3): 10 mg via ORAL
  Filled 2013-03-08 (×3): qty 1

## 2013-03-08 MED ORDER — SODIUM CHLORIDE 0.9 % IV SOLN
INTRAVENOUS | Status: DC
  Administered 2013-03-08: 21:00:00 via INTRAVENOUS
  Administered 2013-03-08: 1000 mL via INTRAVENOUS
  Administered 2013-03-09: 17:00:00 via INTRAVENOUS

## 2013-03-08 MED ORDER — METOPROLOL TARTRATE 25 MG PO TABS
25.0000 mg | ORAL_TABLET | Freq: Two times a day (BID) | ORAL | Status: DC
  Administered 2013-03-08 – 2013-03-11 (×6): 25 mg via ORAL
  Filled 2013-03-08 (×7): qty 1

## 2013-03-08 MED ORDER — IOHEXOL 300 MG/ML  SOLN
80.0000 mL | Freq: Once | INTRAMUSCULAR | Status: AC | PRN
  Administered 2013-03-08: 80 mL via INTRAVENOUS

## 2013-03-08 MED ORDER — PREDNISONE 20 MG PO TABS
40.0000 mg | ORAL_TABLET | Freq: Every day | ORAL | Status: DC
  Administered 2013-03-09: 40 mg via ORAL
  Filled 2013-03-08 (×2): qty 2

## 2013-03-08 MED ORDER — INSULIN ASPART 100 UNIT/ML ~~LOC~~ SOLN
0.0000 [IU] | Freq: Every day | SUBCUTANEOUS | Status: DC
  Administered 2013-03-09 – 2013-03-10 (×2): 3 [IU] via SUBCUTANEOUS

## 2013-03-08 MED ORDER — INSULIN ASPART 100 UNIT/ML ~~LOC~~ SOLN: 3.0000 [IU] | Freq: Three times a day (TID) | SUBCUTANEOUS | Status: DC

## 2013-03-08 MED ORDER — SODIUM CHLORIDE 0.9 % IV SOLN: 1000.0000 mL | INTRAVENOUS | Status: DC

## 2013-03-08 MED ORDER — SIMVASTATIN 40 MG PO TABS: 40.0000 mg | ORAL_TABLET | Freq: Every day | ORAL | Status: DC

## 2013-03-08 MED ORDER — WARFARIN - PHARMACIST DOSING INPATIENT
Freq: Every day | Status: DC
  Administered 2013-03-09: 18:00:00

## 2013-03-08 MED ORDER — INSULIN ASPART 100 UNIT/ML ~~LOC~~ SOLN
0.0000 [IU] | Freq: Three times a day (TID) | SUBCUTANEOUS | Status: DC
  Administered 2013-03-08: 4 [IU] via SUBCUTANEOUS
  Administered 2013-03-09 (×2): 11 [IU] via SUBCUTANEOUS
  Administered 2013-03-10 (×2): 7 [IU] via SUBCUTANEOUS
  Administered 2013-03-10 – 2013-03-11 (×3): 11 [IU] via SUBCUTANEOUS

## 2013-03-08 MED ORDER — HYDROMORPHONE HCL PF 1 MG/ML IJ SOLN: 0.5000 mg | INTRAMUSCULAR | Status: DC | PRN

## 2013-03-08 MED ORDER — PIPERACILLIN-TAZOBACTAM 3.375 G IVPB
3.3750 g | Freq: Three times a day (TID) | INTRAVENOUS | Status: DC
  Administered 2013-03-08 – 2013-03-10 (×5): 3.375 g via INTRAVENOUS
  Filled 2013-03-08 (×8): qty 50

## 2013-03-08 MED ORDER — TRAMADOL HCL 50 MG PO TABS
50.0000 mg | ORAL_TABLET | Freq: Four times a day (QID) | ORAL | Status: DC | PRN
  Administered 2013-03-09: 50 mg via ORAL
  Filled 2013-03-08: qty 1

## 2013-03-08 MED ORDER — WARFARIN SODIUM 2.5 MG PO TABS
2.5000 mg | ORAL_TABLET | Freq: Once | ORAL | Status: AC
  Administered 2013-03-08: 2.5 mg via ORAL
  Filled 2013-03-08: qty 1

## 2013-03-08 NOTE — Progress Notes (Signed)
Speech Language Pathology Treatment Patient Details Name: [name] MRN: [id number] DOB: Nov 11, 1922 Today's Date: 03/08/2013   Full report will be documented.  Recommend: regular texture diet and thin liquids, straws ok, pills ok with water.  MD please order if agree.  Breck Coons [employer] [phone]  03/08/2013

## 2013-03-08 NOTE — Progress Notes (Signed)
[facility] Progress Note [place] TEAM 1 - Stepdown/ICU TEAM   [name] WUJ:[id number] DOB: Jan 27, 1923 DOA: 03/07/2013 PCP: [name], MD  Brief narrative: 79 year old female presented to the ER with lethargy and altered mental status. Very poor historian. According to the ED notes the patient had not been feeling well for a couple of days.   In February the patient started developing skin rash described as pemphigoid-like lesions with blistering and bleeding from them.  The patient is on long-term anticoagulation due to a atrial fibrillation. Upon presentation the patient was found to be in A. fib with RVR with heart rate in the 140s.  She c/o vomiting and not feeling good & generalized pain.  Workup in the ER revealed an elevated lactic acid white count greater than 25,000.  Assessment/Plan:  Sepsis (HR >90, RR > 20, WBC > 12 + pyelonephritis) Cont empiric abx for pyelo - f/u culture - hemodynamically much improved at this time   Pyelonephritis / UTI Cont empiric abx - await culture data  ?bullous cutaneous lesions/rash Has been diagnosed by Dr. Terri Piedra as bullous pemphigoid - is on a steroid taper - cont same at this time   Chronic afib w/ acute RVR Rate much improved with volume expansion - attempt to wean to home meds and follow on tele   Hypothyroidism Cont synthroid  DM Adjust tx as CBG elevated  Chronic Fe deficiency anemia Stable   HTN Controlled at present   Obesity  Code Status: FULL Family Communication: spoke w/ pt and step-daughter at bedside  Disposition Plan: SDU - for eventual SNF for rehab most likely   Consultants: none  Procedures: none  Antibiotics: Zosyn 8/7 >> Vanc 8/7 >>  DVT prophylaxis: warfarin  HPI/Subjective: Pt is much more alert and conversant today.  She c/o feeling weak in general, and has diffuse body aches.  Denies cp, or sob.  Objective: Blood pressure 133/56, pulse 91, temperature 98.4 F (36.9 C),  temperature source Oral, resp. rate 22, height 5\' 2"  (1.575 m), weight 109.2 kg (240 lb 11.9 oz), last menstrual period 09/01/1961, SpO2 94.00%.  Intake/Output Summary (Last 24 hours) at 03/08/13 1403 Last data filed at 03/08/13 1200  Gross per 24 hour  Intake   2225 ml  Output   1000 ml  Net   1225 ml   Exam: General: No acute respiratory distress at rest  Lungs: Clear to auscultation bilaterally without wheezes or crackles - distant BS Cardiovascular: irreg irreg - rate ~110 - no appreciable M Abdomen: morbidly obese, nontender, nondistended, soft, bowel sounds positive, no rebound, no ascites, no appreciable mass Extremities: No significant cyanosis, clubbing, or edema bilateral lower extremities  Data Reviewed: Basic Metabolic Panel:  Recent Labs Lab 03/07/13 1341 03/07/13 1553 03/08/13 0920  NA 135  --  140  K 4.2  --  3.7  CL 96  --  105  CO2 23  --  23  GLUCOSE 274*  --  269*  BUN 30*  --  23  CREATININE 1.23*  --  1.03  CALCIUM 9.7  --  8.4  MG  --  1.4*  --   PHOS  --  3.7  --    Liver Function Tests:  Recent Labs Lab 03/07/13 1341 03/08/13 0920  AST 17 12  ALT 12 9  ALKPHOS 64 59  BILITOT 1.6* 0.9  PROT 7.0 5.9*  ALBUMIN 3.1* 2.4*   CBC:  Recent Labs Lab 03/07/13 1341 03/08/13 0920  WBC 25.4*  12.8*  NEUTROABS 22.3*  --   HGB 14.2 12.0  HCT 41.2 35.7*  MCV 93.6 95.5  PLT 240 186   Cardiac Enzymes:  Recent Labs Lab 03/07/13 1820 03/07/13 2247 03/08/13 0920  TROPONINI <0.30 <0.30 <0.30   CBG:  Recent Labs Lab 03/07/13 2150 03/07/13 2323 03/08/13 0508 03/08/13 0753 03/08/13 1128  GLUCAP 261* 248* 214* 245* 212*    Recent Results (from the past 240 hour(s))  CULTURE, BLOOD (ROUTINE X 2)     Status: None   Collection Time    03/07/13  2:25 PM      Result Value Range Status   Specimen Description BLOOD ARM LEFT   Final   Special Requests BOTTLES DRAWN AEROBIC AND ANAEROBIC 10CC   Final   Culture  Setup Time     Final    Value: 03/07/2013 21:56     Performed at [facility]   Culture     Final   Value:        BLOOD CULTURE RECEIVED NO GROWTH TO DATE CULTURE WILL BE HELD FOR 5 DAYS BEFORE ISSUING A FINAL NEGATIVE REPORT     Performed at [facility]   Report Status PENDING   Incomplete  MRSA PCR SCREENING     Status: None   Collection Time    03/07/13  5:22 PM      Result Value Range Status   MRSA by PCR NEGATIVE  NEGATIVE Final   Comment:            The GeneXpert MRSA Assay (FDA     approved for NASAL specimens     only), is one component of a     comprehensive MRSA colonization     surveillance program. It is not     intended to diagnose MRSA     infection nor to guide or     monitor treatment for     MRSA infections.     Studies:  Recent x-ray studies have been reviewed in detail by the Attending Physician  Scheduled Meds:  Scheduled Meds: . insulin aspart  0-15 Units Subcutaneous TID WC  . insulin aspart protamine- aspart  25 Units Subcutaneous BID WC  . levalbuterol  0.63 mg Nebulization Q6H  . levothyroxine  150 mcg Oral QAC breakfast  . nystatin   Topical BID  . piperacillin-tazobactam  3.375 g Intravenous Q8H  . vancomycin  1,500 mg Intravenous Q24H  . warfarin  2.5 mg Oral ONCE-1800  . Warfarin - Pharmacist Dosing Inpatient   Does not apply q1800   Continuous Infusions: . sodium chloride 1,000 mL (03/07/13 1330)  . diltiazem (CARDIZEM) infusion      Time spent on care of this patient:   [facility] T  [facility] Office  [phone] Pager - Text Page per Loretha Stapler as per below:  On-Call/Text Page:      [email or web address]      password [email or web address]  If 7PM-7AM, please contact night-coverage [email or web address] Password [email or web address] 03/08/2013, 2:03 PM   LOS: 1 day

## 2013-03-08 NOTE — Procedures (Signed)
Objective Swallowing Evaluation: Modified Barium Swallowing Study  Patient Details  Name: [name] MRN: [id number] Date of Birth: [birth year]  Today's Date: 03/08/2013 Time: 1015-1030 SLP Time Calculation (min): 15 min  Past Medical History:  Past Medical History  Diagnosis Date  . Atrial fibrillation   . GERD (gastroesophageal reflux disease)   . Hyperlipidemia   . Hypertension   . Hypothyroid   . Obesity   . Vaginal dysplasia 1980  . Rosacea   . Chest pain 11/2002    cardiac cath neg   . Back pain   . IBS (irritable bowel syndrome)     with diarrhea  . Asthma     as a child  . Adenomatous colon polyp 12/2007  . Hemorrhoids   . Vertigo   . Hiatal hernia 12/2007    EGD  . CHF (congestive heart failure)   . Complication of anesthesia     "dr said I should never be put to sleep again" (03/07/2013)  . Exertional shortness of breath   . Type II diabetes mellitus   . Iron deficiency anemia   . Arthritis     "all over my body" (03/07/2013)  . Vaginal cancer    Past Surgical History:  Past Surgical History  Procedure Laterality Date  . Cholecystectomy  2006  . Joint replacement      Rt TKR 1993 & Lt TKR 2001  . Cervical cone biopsy  1963    D&C  . Cystocele repair  1978    /rectocele  . Orif ankle fracture Right 2006  . Total knee arthroplasty Right 1993  . Total knee arthroplasty Left 2001  . Cataract extraction w/ intraocular lens  implant, bilateral Bilateral 2006  . Ankle fracture surgery Right 2011    fall/followed by rehab stay; "hand to have 9 pins put in it" (03/07/2013)  . Cardiac catheterization  11/2002    [name] 12/11/2002 (03/07/2013)  . Appendectomy  1941    [name] 11/17/1999 (03/07/2013)  . Abdominal hysterectomy  1963    partial/notes 11/17/1999 (03/07/2013   HPI:  79 year old female presented to the ER with lethargy and altered mental status. PMH of GERD, DM, HTN, IBS, Hiatal hernia, obesity, and hypothyroid. Very poor historian. According to the ED  notes the patient has not been feeling well for the last couple of days. Upon presentation the patient was also found to be in A. fib with RVR with heart rate in the 140s. Workup in the ER revealed an elevated lactic acid white count of greater than 25,000, she is being admitted for sepsis. CXR Impression (03/07/13): New patchy left lower lobe atelectasis or infiltrate, possibly on the basis of aspiration.  MBS recommended followiing BSE.     Assessment / Plan / Recommendation Clinical Impression  Dysphagia Diagnosis: Mild pharyngeal phase dysphagia Clinical impression: Pt. exhibited min-mild pharyngeal residue as a result of decreased laryngeal elevation leading to mild pyriform sinus residue.  Pt. appears to have shallow pyriform sinsuses therefore is at higher aspiration risk post swallow from residue.  Verbal cues to swallow twice was effective in clearing residue.  No penetration or aspiration observed.  Esophagus brielfy observed without abnormalities seen (no radiologist present to confirm).  Recommend she initiate a regular diet texture with thin liquids, straws ok and pills with water.  ST will follow for safety and efficiency with po's.    Treatment Recommendation  Therapy as outlined in treatment plan below    Diet Recommendation Regular;Thin liquid   Liquid  Administration via: Straw;Cup Medication Administration: Whole meds with liquid Supervision: Patient able to self feed;Intermittent supervision to cue for compensatory strategies Compensations: Small sips/bites;Check for pocketing;Multiple dry swallows after each bite/sip Postural Changes and/or Swallow Maneuvers: Seated upright 90 degrees;Upright 30-60 min after meal    Other  Recommendations Oral Care Recommendations: Oral care BID   Follow Up Recommendations  None    Frequency and Duration min 2x/week  2 weeks   Pertinent Vitals/Pain none    SLP Swallow Goals Patient will utilize recommended strategies during swallow to  increase swallowing safety with: Supervision/safety      Reason for Referral Objectively evaluate swallowing function   Oral Phase Oral Preparation/Oral Phase Oral Phase: WFL   Pharyngeal Phase Pharyngeal Phase Pharyngeal Phase: Impaired Pharyngeal - Nectar Pharyngeal - Nectar Teaspoon: Delayed swallow initiation;Premature spillage to valleculae Pharyngeal - Nectar Cup: Pharyngeal residue - pyriform sinuses;Reduced laryngeal elevation;Delayed swallow initiation;Premature spillage to valleculae Pharyngeal - Thin Pharyngeal - Thin Cup: Pharyngeal residue - pyriform sinuses;Reduced laryngeal elevation Pharyngeal - Thin Straw: Pharyngeal residue - pyriform sinuses;Reduced laryngeal elevation  Cervical Esophageal Phase    GO    Cervical Esophageal Phase Cervical Esophageal Phase: [facility]         Darrow Bussing.Ed [employer] [phone]  03/08/2013

## 2013-03-08 NOTE — Progress Notes (Addendum)
Inpatient Diabetes Program Recommendations  AACE/ADA: New Consensus Statement on Inpatient Glycemic Control (2013)  Target Ranges:  Prepandial:   less than 140 mg/dL      Peak postprandial:   less than 180 mg/dL (1-2 hours)      Critically ill patients:  140 - 180 mg/dL     Results for [name], [name] (MRN [id number]) as of 03/08/2013 11:15  Ref. Range 03/07/2013 23:23 03/08/2013 05:08 03/08/2013 07:53  Glucose-Capillary Latest Range: 70-99 mg/dL 409 (H) 811 (H) 914 (H)    **Admitted with sepsis per notes.  Has history of DM.  Takes 70/30 insulin at home- 50 units bid with meals (breakfast and supper)  **Noted patient currently NPO.  Swallow study has been completed.  Anticipate diet will be started today?  Also noted patient only getting 1/2 home 70/30 dose (25 units bid with meals)  **MD- If you start patient on diet, would continue 70/30 insulin and can titrate upward as needed based on CBG readings.  If patient remains NPO, recommend d/c of 70/30 insulin (70/30 not an optimal insulin when patient not eating) and initiation of basal insulin (Lantus or Levemir).  Could start with Lantus 28 units daily (this would be close to 1/2 home dose of the basal insulin in her 70/30 insulin at 80%).   Will follow. Ambrose Finland RN, MSN, CDE Diabetes Coordinator Inpatient Diabetes Program [phone]

## 2013-03-08 NOTE — Progress Notes (Signed)
Pt transported to procedural area for swallowing evaluation.  Pt alert and oriented prior to transport with son at bedside

## 2013-03-08 NOTE — Evaluation (Signed)
Clinical/Bedside Swallow Evaluation Patient Details  Name: [name] MRN: [id number] Date of Birth: 1923-04-18  Today's Date: 03/08/2013 Time: 4098-1191 SLP Time Calculation (min): 28 min  Past Medical History:  Past Medical History  Diagnosis Date   Atrial fibrillation    GERD (gastroesophageal reflux disease)    Hyperlipidemia    Hypertension    Hypothyroid    Obesity    Vaginal dysplasia 1980   Rosacea    Chest pain 11/2002    cardiac cath neg    Back pain    IBS (irritable bowel syndrome)     with diarrhea   Asthma     as a child   Adenomatous colon polyp 12/2007   Hemorrhoids    Vertigo    Hiatal hernia 12/2007    EGD   CHF (congestive heart failure)    Complication of anesthesia     "dr said I should never be put to sleep again" (03/07/2013)   Exertional shortness of breath    Type II diabetes mellitus    Iron deficiency anemia    Arthritis     "all over my body" (03/07/2013)   Vaginal cancer    Past Surgical History:  Past Surgical History  Procedure Laterality Date   Cholecystectomy  2006   Joint replacement      Rt TKR 1993 & Lt TKR 2001   Cervical cone biopsy  1963    D&C   Cystocele repair  1978    /rectocele   Orif ankle fracture Right 2006   Total knee arthroplasty Right 1993   Total knee arthroplasty Left 2001   Cataract extraction w/ intraocular lens  implant, bilateral Bilateral 2006   Ankle fracture surgery Right 2011    fall/followed by rehab stay; "hand to have 9 pins put in it" (03/07/2013)   Cardiac catheterization  11/2002    [name] 12/11/2002 (03/07/2013)   Appendectomy  1941    [name] 11/17/1999 (03/07/2013)   Abdominal hysterectomy  1963    partial/notes 11/17/1999 (03/07/2013   HPI:  79 year old female presented to the ER with lethargy and altered mental status. PMH of GERD, DM, HTN, IBS, Hiatal hernia, obesity, and hypothyroid. Very poor historian. According to the ED notes the patient has not been  feeling well for the last couple of days. Upon presentation the patient was also found to be in A. fib with RVR with heart rate in the 140s. Workup in the ER revealed an elevated lactic acid white count of greater than 25,000, she is being admitted for sepsis. CXR Impression (03/07/13): New patchy left lower lobe atelectasis or infiltrate, possibly on the basis of aspiration.   Assessment / Plan / Recommendation Clinical Impression  Pt presents with moderate oropharyngeal dysphagia characterized by suspected delay in swallow initiation, multiple swallows, and s/s of aspiration/penetration with thin liquids. Clinician provided oral care prior to po trials and edu to pt and family regarding safe swallowing strategies and precautions. During trials of thin liquids, pt with suspected aspiration/penetration demonstrated by delayed cough and throat clear. Secondary to risk for aspiration PNA, recommend MBSS to objectively identify swallow function and least restrictive diet.    Aspiration Risk  Moderate    Diet Recommendation NPO        Other  Recommendations Recommended Consults: MBS Oral Care Recommendations: Oral care BID   Follow Up Recommendations       Frequency and Duration        Pertinent Vitals/Pain C/o of pain all over  Swallow Study Prior Functional Status   WFL    General HPI: 79 year old female presented to the ER with lethargy and altered mental status. PMH of GERD, DM, HTN, IBS, Hiatal hernia, obesity, and hypothyroid. Very poor historian. According to the ED notes the patient has not been feeling well for the last couple of days. Upon presentation the patient was also found to be in A. fib with RVR with heart rate in the 140s. Workup in the ER revealed an elevated lactic acid white count of greater than 25,000, she is being admitted for sepsis. CXR Impression (03/07/13): New patchy left lower lobe atelectasis or infiltrate, possibly on the basis of aspiration. Type of Study:  Bedside swallow evaluation Diet Prior to this Study: Regular;Thin liquids Temperature Spikes Noted: No Respiratory Status: Supplemental O2 delivered via (comment) History of Recent Intubation: No Behavior/Cognition: Alert;Hard of hearing Oral Cavity - Dentition: Adequate natural dentition Self-Feeding Abilities: Able to feed self Patient Positioning: Upright in bed Baseline Vocal Quality: Clear Volitional Cough: Strong Volitional Swallow: Able to elicit    Oral/Motor/Sensory Function Overall Oral Motor/Sensory Function: Appears within functional limits for tasks assessed   Ice Chips     Thin Liquid Thin Liquid: Impaired Presentation: Cup Pharyngeal  Phase Impairments: Multiple swallows;Wet Vocal Quality;Throat Clearing - Delayed;Cough - Delayed;Suspected delayed Swallow    Nectar Thick Nectar Thick Liquid: Not tested   Honey Thick Honey Thick Liquid: Not tested   Puree Puree: Within functional limits Presentation: Spoon   Solid   GO    Solid: Not tested       Lyanne Co CCC-SLP 03/08/2013,9:10 AM

## 2013-03-08 NOTE — Progress Notes (Signed)
ANTICOAGULATION CONSULT NOTE  Pharmacy Consult for Warfarin Indication: atrial fibrillation  Allergies  Allergen Reactions  . Ciprofloxacin     Nausea Possibly rash and itching- but not sure   . Gabapentin Nausea And Vomiting  . Insulin Glargine     Itching   . Latex     Rash   . Metformin And Related     Severe diarrhea   . Metoclopramide Hcl     REACTION: unspecified  . Nsaids     REACTION: unspecified  . Promethazine Hcl     REACTION: unspecified    Labs:  Recent Labs  03/07/13 1341 03/07/13 1820 03/07/13 2247 03/08/13 0920 03/08/13 1200  HGB 14.2  --   --  12.0  --   HCT 41.2  --   --  35.7*  --   PLT 240  --   --  186  --   LABPROT 22.7*  --   --  22.6* 22.8*  INR 2.08*  --   --  2.06* 2.09*  CREATININE 1.23*  --   --  1.03  --   TROPONINI  --  <0.30 <0.30 <0.30  --     Estimated Creatinine Clearance: 43.9 ml/min (by C-G formula based on Cr of 1.03).   Assessment: 79 y.o. F who presented to the [facility] on 03/07/13 with AMS and fevers and started on Vancomycin + Zosyn for empiric coverage. The patient was on warfarin PTA for hx Afib and admitted with a therapeutic INR of 2.08 on a home dose of 2.5 mg daily EXCEPT for 5 mg on Mondays only. Pharmacy was consulted to resume warfarin dosing this admission. CBC wnl, no bleeding noted.   The patient has been started on broad spectrum antibiotics and is currently NPO -- both of which will increase warfarin sensitivity. Will plan to dose warfarin cautiously in this setting however given INR on lower end of range   Will continue with home dose today  Goal of Therapy:  INR 2-3  Plan:  1. Warfarin 2.5 mg x 1 dose at 1800 today 2. Daily PT/INR 3. Will continue to monitor for any signs/symptoms of bleeding and will follow up with PT/INR in the a.m.   Thank you. Okey Regal, PharmD [phone]   03/08/2013 1:26 PM

## 2013-03-08 NOTE — Care Management Note (Addendum)
    Page 1 of 2   03/11/2013     3:11:09 PM   CARE MANAGEMENT NOTE 03/11/2013  Patient:  [name], [name]   Account Number:  [id number]  Date Initiated:  03/08/2013  Documentation initiated by:  Junius Creamer  Subjective/Objective Assessment:   adm w sepsis     Action/Plan:   lives w husband, pcp dr Idamae [place]ull[place]   Anticipated DC Date:  03/11/2013   Anticipated DC Plan:  HOME W HOME HEALTH SERVICES      DC Planning Services  CM consult      [facility] Choice  HOME HEALTH   Choice offered to / List presented to:  [name]   DME arranged  Levan Hurst      DME agency  [facility]     HH arranged  HH-2 PT      [facility] agency  [facility]   Status of service:  Completed, signed off Medicare Important Message given?   (If response is "NO", the following Medicare IM given date fields will be blank) Date Medicare IM given:   Date Additional Medicare IM given:    Discharge Disposition:  HOME W HOME HEALTH SERVICES  Per UR Regulation:  Reviewed for med. necessity/level of care/duration of stay  If discussed at Long Length of Stay Meetings, dates discussed:    Comments:  03/11/13 12:37 Letha Cape RN, BSN [phone] patient lives with spouse, states he is there with her all day except when he goes out to the store for 30 mins.  She states she has a rolling walker at home, she would like to talk to [facility] about getting another walker, informed her that if it has been less then 5 yrs when she received the one she has that her insurance will not pay for another one, she states she understood.  Justin with [facility] will come up to speak with patient as well.

## 2013-03-09 LAB — BASIC METABOLIC PANEL
BUN: 18 mg/dL (ref 6–23)
CO2: 25 mEq/L (ref 19–32)
Calcium: 8.2 mg/dL — ABNORMAL LOW (ref 8.4–10.5)
GFR calc non Af Amer: 47 mL/min — ABNORMAL LOW (ref >90)
Glucose, Bld: 173 mg/dL — ABNORMAL HIGH (ref 70–99)
Potassium: 3.2 mEq/L — ABNORMAL LOW (ref 3.5–5.1)

## 2013-03-09 LAB — GLUCOSE, CAPILLARY
Glucose-Capillary: 198 mg/dL — ABNORMAL HIGH (ref 70–99)
Glucose-Capillary: 176 mg/dL — ABNORMAL HIGH (ref 70–99)
Glucose-Capillary: 265 mg/dL — ABNORMAL HIGH (ref 70–99)
Glucose-Capillary: 251 mg/dL — ABNORMAL HIGH (ref 70–99)

## 2013-03-09 LAB — CBC
HCT: 34.1 % — ABNORMAL LOW (ref 36.0–46.0)
Hemoglobin: 11 g/dL — ABNORMAL LOW (ref 12.0–15.0)
MCH: 31.3 pg (ref 26.0–34.0)
MCHC: 32.3 g/dL (ref 30.0–36.0)
RBC: 3.52 MIL/uL — ABNORMAL LOW (ref 3.87–5.11)

## 2013-03-09 MED ORDER — PREDNISONE 20 MG PO TABS
20.0000 mg | ORAL_TABLET | Freq: Every day | ORAL | Status: DC
  Administered 2013-03-10 – 2013-03-11 (×2): 20 mg via ORAL
  Filled 2013-03-09 (×3): qty 1

## 2013-03-09 MED ORDER — POTASSIUM CHLORIDE CRYS ER 20 MEQ PO TBCR
40.0000 meq | EXTENDED_RELEASE_TABLET | Freq: Once | ORAL | Status: AC
  Administered 2013-03-09: 40 meq via ORAL
  Filled 2013-03-09: qty 2

## 2013-03-09 MED ORDER — LEVALBUTEROL HCL 0.63 MG/3ML IN NEBU
0.6300 mg | INHALATION_SOLUTION | Freq: Two times a day (BID) | RESPIRATORY_TRACT | Status: DC
  Administered 2013-03-10 – 2013-03-11 (×3): 0.63 mg via RESPIRATORY_TRACT
  Filled 2013-03-09 (×5): qty 3

## 2013-03-09 MED ORDER — WARFARIN SODIUM 2.5 MG PO TABS
2.5000 mg | ORAL_TABLET | Freq: Once | ORAL | Status: AC
  Administered 2013-03-09: 2.5 mg via ORAL
  Filled 2013-03-09: qty 1

## 2013-03-09 NOTE — Progress Notes (Signed)
ANTICOAGULATION CONSULT NOTE  Pharmacy Consult for Warfarin Indication: atrial fibrillation  Allergies  Allergen Reactions  . Ciprofloxacin     Nausea Possibly rash and itching- but not sure   . Gabapentin Nausea And Vomiting  . Insulin Glargine     Itching   . Latex     Rash   . Metformin And Related     Severe diarrhea   . Metoclopramide Hcl     REACTION: unspecified  . Nsaids     REACTION: unspecified  . Promethazine Hcl     REACTION: unspecified    Labs:  Recent Labs  03/07/13 1341 03/07/13 1820 03/07/13 2247 03/08/13 0920 03/08/13 1200 03/09/13 0415  HGB 14.2  --   --  12.0  --  11.0*  HCT 41.2  --   --  35.7*  --  34.1*  PLT 240  --   --  186  --  180  LABPROT 22.7*  --   --  22.6* 22.8* 25.6*  INR 2.08*  --   --  2.06* 2.09* 2.43*  CREATININE 1.23*  --   --  1.03  --  1.03  TROPONINI  --  <0.30 <0.30 <0.30  --   --     Estimated Creatinine Clearance: 43.9 ml/min (by C-G formula based on Cr of 1.03).   Assessment: 79 y.o. F who presented to the [facility] on 03/07/13 with AMS and fevers and started on Vancomycin + Zosyn for empiric coverage. The patient was on warfarin PTA for hx Afib and admitted with a therapeutic INR of 2.08 on a home dose of 2.5 mg daily EXCEPT for 5 mg on Mondays only. Pharmacy was consulted to resume warfarin dosing this admission. INR today= 2.43  The patient is on broad spectrum antibiotics may increase warfarin sensitivity.    Goal of Therapy:  INR 2-3  Plan:  1. Warfarin 2.5 mg x 1 dose at 1800 today 2. Daily PT/INR 3. Will continue to monitor for any signs/symptoms of bleeding and will follow up with PT/INR in the a.m.   Harland German, Pharm D 03/09/2013 8:22 AM

## 2013-03-09 NOTE — Progress Notes (Signed)
[facility] Progress Note [place] TEAM 1 - Stepdown/ICU TEAM   [name] [place] ZOX:[id number] DOB: [birth year] DOA: 03/07/2013 PCP: [name], MD  Brief narrative: 79 year old female presented to the ER with lethargy and altered mental status. Very poor historian. According to the ED notes the patient had not been feeling well for a couple of days.   In February the patient started developing skin rash described as pemphigoid-like lesions with blistering and bleeding from them.  The patient is on long-term anticoagulation due to a atrial fibrillation. Upon presentation the patient was found to be in A. fib with RVR with heart rate in the 140s.  She c/o vomiting and not feeling good & generalized pain.  Workup in the ER revealed an elevated lactic acid white count greater than 25,000.  Assessment/Plan:  Sepsis (HR >90, RR > 20, WBC > 12 + pyelonephritis) Cont empiric abx for pyelo - f/u culture - hemodynamically stable with resolution of sepsis physiology   Pyelonephritis - gram neg rod Cont empiric abx - await culture data to allow narrowing of abx  ?bullous cutaneous lesions/rash Has been diagnosed by Dr. Terri Piedra as bullous pemphigoid - is on a steroid taper - cont same at this time   Chronic afib w/ acute RVR Rate much improved with volume expansion - weaned to home meds - follow on tele   Hypothyroidism Cont synthroid  DM CBG erratic, but improved in general - follow w/o further change today   Chronic Fe deficiency anemia Stable   HTN Controlled at present   Hypokalemia Replace and follow   Obesity  Code Status: FULL Family Communication: spoke w/ pt at bedside  Disposition Plan: transfer to tele bed - begin PT/OT - for eventual SNF for rehab most likely   Consultants: none  Procedures: none  Antibiotics: Zosyn 8/7 >> Vanc 8/7 >>8/9  DVT prophylaxis: warfarin  HPI/Subjective: Awake and alert.  Denies n/v, abdom pain, cp, or sob.     Objective: Blood pressure 150/69, pulse 103, temperature 98.5 F (36.9 C), temperature source Oral, resp. rate 16, height 5\' 2"  (1.575 m), weight 109.2 kg (240 lb 11.9 oz), last menstrual period 09/01/1961, SpO2 96.00%.  Intake/Output Summary (Last 24 hours) at 03/09/13 1513 Last data filed at 03/09/13 1321  Gross per 24 hour  Intake 2131.67 ml  Output    575 ml  Net 1556.67 ml   Exam: General: No acute respiratory distress at rest  Lungs: Clear to auscultation bilaterally without wheezes or crackles - distant BS Cardiovascular: irreg irreg - rate 90 - no appreciable M Abdomen: morbidly obese, nontender, nondistended, soft, bowel sounds positive, no rebound, no ascites, no appreciable mass Extremities: No significant cyanosis, clubbing, or edema bilateral lower extremities  Data Reviewed: Basic Metabolic Panel:  Recent Labs Lab 03/07/13 1341 03/07/13 1553 03/08/13 0920 03/09/13 0415  NA 135  --  140 139  K 4.2  --  3.7 3.2*  CL 96  --  105 106  CO2 23  --  23 25  GLUCOSE 274*  --  269* 173*  BUN 30*  --  23 18  CREATININE 1.23*  --  1.03 1.03  CALCIUM 9.7  --  8.4 8.2*  MG  --  1.4*  --   --   PHOS  --  3.7  --   --    Liver Function Tests:  Recent Labs Lab 03/07/13 1341 03/08/13 0920  AST 17 12  ALT 12 9  ALKPHOS 64 59  BILITOT 1.6* 0.9  PROT 7.0 5.9*  ALBUMIN 3.1* 2.4*   CBC:  Recent Labs Lab 03/07/13 1341 03/08/13 0920 03/09/13 0415  WBC 25.4* 12.8* 11.0*  NEUTROABS 22.3*  --   --   HGB 14.2 12.0 11.0*  HCT 41.2 35.7* 34.1*  MCV 93.6 95.5 96.9  PLT 240 186 180   Cardiac Enzymes:  Recent Labs Lab 03/07/13 1820 03/07/13 2247 03/08/13 0920  TROPONINI <0.30 <0.30 <0.30   CBG:  Recent Labs Lab 03/08/13 1128 03/08/13 1657 03/08/13 2154 03/09/13 0800 03/09/13 1154  GLUCAP 212* 154* 198* 176* 265*    Recent Results (from the past 240 hour(s))  CULTURE, BLOOD (ROUTINE X 2)     Status: None   Collection Time    03/07/13  1:25 PM       Result Value Range Status   Specimen Description BLOOD ARM RIGHT   Final   Special Requests BOTTLES DRAWN AEROBIC ONLY 9CC   Final   Culture  Setup Time     Final   Value: 03/08/2013 00:21     Performed at [facility]   Culture     Final   Value:        BLOOD CULTURE RECEIVED NO GROWTH TO DATE CULTURE WILL BE HELD FOR 5 DAYS BEFORE ISSUING A FINAL NEGATIVE REPORT     Performed at [facility]   Report Status PENDING   Incomplete  URINE CULTURE     Status: None   Collection Time    03/07/13  1:45 PM      Result Value Range Status   Specimen Description URINE, RANDOM   Final   Special Requests NONE   Final   Culture  Setup Time     Final   Value: 03/07/2013 15:13     Performed at [facility] Count     Final   Value: >=100,000 COLONIES/ML     Performed at [facility]   Culture     Final   Value: GRAM NEGATIVE RODS     Performed at [facility]   Report Status PENDING   Incomplete  CULTURE, BLOOD (ROUTINE X 2)     Status: None   Collection Time    03/07/13  2:25 PM      Result Value Range Status   Specimen Description BLOOD ARM LEFT   Final   Special Requests BOTTLES DRAWN AEROBIC AND ANAEROBIC 10CC   Final   Culture  Setup Time     Final   Value: 03/07/2013 21:56     Performed at [facility]   Culture     Final   Value:        BLOOD CULTURE RECEIVED NO GROWTH TO DATE CULTURE WILL BE HELD FOR 5 DAYS BEFORE ISSUING A FINAL NEGATIVE REPORT     Performed at [facility]   Report Status PENDING   Incomplete  MRSA PCR SCREENING     Status: None   Collection Time    03/07/13  5:22 PM      Result Value Range Status   MRSA by PCR NEGATIVE  NEGATIVE Final   Comment:            The GeneXpert MRSA Assay (FDA     approved for NASAL specimens     only), is one component of a     comprehensive MRSA colonization     surveillance program. It is not  intended to diagnose MRSA     infection nor to guide or      monitor treatment for     MRSA infections.     Studies:  Recent x-ray studies have been reviewed in detail by the Attending Physician  Scheduled Meds:  Scheduled Meds: . atorvastatin  20 mg Oral q1800  . insulin aspart  0-20 Units Subcutaneous TID WC  . insulin aspart  0-5 Units Subcutaneous QHS  . insulin aspart protamine- aspart  25 Units Subcutaneous BID WC  . levalbuterol  0.63 mg Nebulization Q6H  . levothyroxine  150 mcg Oral QAC breakfast  . metoprolol  25 mg Oral BID  . nystatin   Topical BID  . piperacillin-tazobactam (ZOSYN)  IV  3.375 g Intravenous Q8H  . predniSONE  40 mg Oral Q breakfast  . vancomycin  1,500 mg Intravenous Q24H  . warfarin  2.5 mg Oral ONCE-1800  . Warfarin - Pharmacist Dosing Inpatient   Does not apply q1800   Time spent on care of this patient:   [facility] T  [facility] Office  [phone] Pager - Text Page per Loretha Stapler as per below:  On-Call/Text Page:      [email or web address]      password [email or web address]  If 7PM-7AM, please contact night-coverage [email or web address] Password [email or web address] 03/09/2013, 3:13 PM   LOS: 2 days

## 2013-03-09 NOTE — Progress Notes (Signed)
  Pt admitted to the unit. Pt is stable, alert and oriented per baseline. Oriented to room, staff, and call bell. Educated to call for any assistance. Bed in lowest position, call bell within reach- will continue to monitor. 

## 2013-03-10 DIAGNOSIS — E1142 Type 2 diabetes mellitus with diabetic polyneuropathy: Secondary | ICD-10-CM

## 2013-03-10 DIAGNOSIS — E1149 Type 2 diabetes mellitus with other diabetic neurological complication: Secondary | ICD-10-CM

## 2013-03-10 DIAGNOSIS — N10 Acute tubulo-interstitial nephritis: Secondary | ICD-10-CM

## 2013-03-10 DIAGNOSIS — I4891 Unspecified atrial fibrillation: Secondary | ICD-10-CM

## 2013-03-10 LAB — CBC
MCH: 31.2 pg (ref 26.0–34.0)
MCHC: 33.2 g/dL (ref 30.0–36.0)
Platelets: 223 10*3/uL (ref 150–400)

## 2013-03-10 LAB — BASIC METABOLIC PANEL
BUN: 16 mg/dL (ref 6–23)
Calcium: 9.2 mg/dL (ref 8.4–10.5)
GFR calc Af Amer: 60 mL/min — ABNORMAL LOW (ref >90)
GFR calc non Af Amer: 52 mL/min — ABNORMAL LOW (ref >90)
Glucose, Bld: 216 mg/dL — ABNORMAL HIGH (ref 70–99)
Sodium: 139 mEq/L (ref 135–145)

## 2013-03-10 LAB — URINE CULTURE: Colony Count: >=100000

## 2013-03-10 LAB — GLUCOSE, CAPILLARY: Glucose-Capillary: 291 mg/dL — ABNORMAL HIGH (ref 70–99)

## 2013-03-10 LAB — PROTIME-INR: Prothrombin Time: 23.3 seconds — ABNORMAL HIGH (ref 11.6–15.2)

## 2013-03-10 MED ORDER — WARFARIN SODIUM 2.5 MG PO TABS
2.5000 mg | ORAL_TABLET | Freq: Once | ORAL | Status: AC
  Administered 2013-03-10: 2.5 mg via ORAL
  Filled 2013-03-10: qty 1

## 2013-03-10 MED ORDER — CEFUROXIME AXETIL 500 MG PO TABS
500.0000 mg | ORAL_TABLET | Freq: Two times a day (BID) | ORAL | Status: DC
  Administered 2013-03-10 – 2013-03-11 (×2): 500 mg via ORAL
  Filled 2013-03-10 (×4): qty 1

## 2013-03-10 NOTE — Evaluation (Signed)
Occupational Therapy Evaluation Patient Details Name: [name] MRN: [id number] DOB: 22-Dec-1922 Today's Date: 03/10/2013 Time: 4098-1191 OT Time Calculation (min): 20 min  OT Assessment / Plan / Recommendation History of present illness Pt presented to the ER with lethargy and altered mental status. Very poor historian. According to the ED notes the patient had not been feeling well for a couple of days.   In February the patient started developing skin rash described as pemphigoid-like lesions with blistering and bleeding from them.  The patient is on long-term anticoagulation due to a atrial fibrillation. Upon presentation the patient was found to be in A. fib with RVR with heart rate in the 140s.  She c/o vomiting and not feeling good & generalized pain.  Workup in the ER revealed an elevated lactic acid white count greater than 25,000.   Clinical Impression   Pt admitted with above. Pt presenting with generalized weakness as well as below problem list. Will continue to follow pt acutely in order to address below problem list. Recommending 24/7 supervision/assist at home.    OT Assessment  Patient needs continued OT Services    Follow Up Recommendations  No OT follow up;Supervision/Assistance - 24 hour    Barriers to Discharge      Equipment Recommendations  None recommended by OT    Recommendations for Other Services    Frequency  Min 2X/week    Precautions / Restrictions Precautions Precautions: Fall   Pertinent Vitals/Pain See vitals    ADL  Grooming: Performed;Wash/dry hands;Wash/dry face;Set up Where Assessed - Grooming: Unsupported sitting Upper Body Bathing: Simulated;Set up Where Assessed - Upper Body Bathing: Unsupported sitting Lower Body Bathing: Simulated;Minimal assistance Where Assessed - Lower Body Bathing: Supported sit to stand Upper Body Dressing: Performed;Set up Where Assessed - Upper Body Dressing: Unsupported sitting Lower Body Dressing:  Simulated;Minimal assistance Where Assessed - Lower Body Dressing: Supported sit to stand Toilet Transfer: Simulated;Min Pension scheme manager Method:  (ambulating) Acupuncturist:  (chair) Equipment Used: Gait belt;Rolling walker Transfers/Ambulation Related to ADLs: min guard with RW. cues for upright posture.    OT Diagnosis: Generalized weakness  OT Problem List: Decreased strength;Decreased activity tolerance;Decreased knowledge of use of DME or AE OT Treatment Interventions: Self-care/ADL training;DME and/or AE instruction;Therapeutic activities;Patient/family education   OT Goals(Current goals can be found in the care plan section) Acute Rehab OT Goals Patient Stated Goal: to return home OT Goal Formulation: With patient Time For Goal Achievement: 03/17/13 Potential to Achieve Goals: Good  Visit Information  Last OT Received On: 03/10/13 Assistance Needed: +1 History of Present Illness: 79 year old female presented to the ER with lethargy and altered mental status. Very poor historian. According to the ED notes the patient had not been feeling well for a couple of days.   In February the patient started developing skin rash described as pemphigoid-like lesions with blistering and bleeding from them.  The patient is on long-term anticoagulation due to a atrial fibrillation. Upon presentation the patient was found to be in A. fib with RVR with heart rate in the 140s.  She c/o vomiting and not feeling good & generalized pain.  Workup in the ER revealed an elevated lactic acid white count greater than 25,000.       Prior Functioning     Home Living Family/patient expects to be discharged to:: Private residence Living Arrangements: Spouse/significant other Available Help at Discharge: Family Type of Home: House Home Access: Ramped entrance Home Layout: One level Home Equipment: Environmental consultant -  2 wheels Prior Function Level of Independence: Needs assistance ADL's /  Homemaking Assistance Needed: Husband helps with cooking/cleaning Communication Communication: HOH Dominant Hand: Right         Vision/Perception     Cognition  Cognition Arousal/Alertness: Awake/alert Behavior During Therapy: WFL for tasks assessed/performed Overall Cognitive Status: Within Functional Limits for tasks assessed    Extremity/Trunk Assessment Upper Extremity Assessment Upper Extremity Assessment: Overall WFL for tasks assessed     Mobility Bed Mobility Bed Mobility: Not assessed Details for Bed Mobility Assistance: pt up in chair Transfers Transfers: Sit to Stand;Stand to Sit Sit to Stand: 4: Min guard;From chair/3-in-1;With upper extremity assist;With armrests Stand to Sit: 4: Min guard;To chair/3-in-1;With armrests;With upper extremity assist Details for Transfer Assistance: Min guard for safety. VCs for safe hand placement and to control descent.     Exercise     Balance     End of Session OT - End of Session Equipment Utilized During Treatment: Gait belt;Rolling walker Activity Tolerance: Patient tolerated treatment well Patient left: in chair;with call bell/phone within reach;with family/visitor present Nurse Communication: Mobility status  GO   03/10/2013 Cipriano Mile OTR/L Pager [phone] Office [phone]    Cipriano Mile 03/10/2013, 3:44 PM

## 2013-03-10 NOTE — Progress Notes (Signed)
[facility] Progress Note   [name] ZOX:[id number] DOB: October 17, 1922 DOA: 03/07/2013 PCP: [name], MD  Brief narrative: 79 year old female presented to the ER with lethargy and altered mental status. Very poor historian. According to the ED notes the patient had not been feeling well for a couple of days.   In February the patient started developing skin rash described as pemphigoid-like lesions with blistering and bleeding from them.  The patient is on long-term anticoagulation due to a atrial fibrillation. Upon presentation the patient was found to be in A. fib with RVR with heart rate in the 140s.  She c/o vomiting and not feeling good & generalized pain.  Workup in the ER revealed an elevated lactic acid white count greater than 25,000.  Assessment/Plan:  Sepsis (HR >90, RR > 20, WBC > 12 + pyelonephritis) Cont empiric abx for pyelo - f/u culture - hemodynamically stable with resolution of sepsis physiology   Pyelonephritis - Klebsiella pneumoniae On Zosyn, shows susceptibility to ciprofloxacin will switch to oral Cipro.  ?bullous cutaneous lesions/rash Has been diagnosed by Dr. Terri Piedra as bullous pemphigoid - is on a steroid taper - cont same at this time   Chronic afib w/ acute RVR Rate much improved with volume expansion - weaned to home meds - follow on tele   Hypothyroidism Cont synthroid  DM CBG erratic, but improved in general - follow w/o further change today   Chronic Fe deficiency anemia Stable   HTN Controlled at present   Hypokalemia Replace and follow   Obesity  Code Status: FULL Family Communication: spoke w/ pt at bedside  Disposition Plan: transfer to tele bed - begin PT/OT - for eventual SNF for rehab most likely   Consultants: none  Procedures: none  Antibiotics: Zosyn 8/7 >> Vanc 8/7 >>8/9  DVT prophylaxis: warfarin  HPI/Subjective: Awake and alert.  Denies n/v, abdom pain, cp, or sob.    Objective: Blood pressure  148/77, pulse 83, temperature 97.6 F (36.4 C), temperature source Oral, resp. rate 20, height 5\' 2"  (1.575 m), weight 109.2 kg (240 lb 11.9 oz), last menstrual period 09/01/1961, SpO2 98.00%.  Intake/Output Summary (Last 24 hours) at 03/10/13 1156 Last data filed at 03/10/13 0900  Gross per 24 hour  Intake 4089.58 ml  Output      0 ml  Net 4089.58 ml   Exam: General: No acute respiratory distress at rest  Lungs: Clear to auscultation bilaterally without wheezes or crackles - distant BS Cardiovascular: irreg irreg - rate 90 - no appreciable M Abdomen: morbidly obese, nontender, nondistended, soft, bowel sounds positive, no rebound, no ascites, no appreciable mass Extremities: No significant cyanosis, clubbing, or edema bilateral lower extremities  Data Reviewed: Basic Metabolic Panel:  Recent Labs Lab 03/07/13 1341 03/07/13 1553 03/08/13 0920 03/09/13 0415 03/10/13 0615  NA 135  --  140 139 139  K 4.2  --  3.7 3.2* 3.6  CL 96  --  105 106 103  CO2 23  --  23 25 21   GLUCOSE 274*  --  269* 173* 216*  BUN 30*  --  23 18 16   CREATININE 1.23*  --  1.03 1.03 0.95  CALCIUM 9.7  --  8.4 8.2* 9.2  MG  --  1.4*  --   --   --   PHOS  --  3.7  --   --   --    Liver Function Tests:  Recent Labs Lab 03/07/13 1341 03/08/13 0920  AST 17 12  ALT 12 9  ALKPHOS 64 59  BILITOT 1.6* 0.9  PROT 7.0 5.9*  ALBUMIN 3.1* 2.4*   CBC:  Recent Labs Lab 03/07/13 1341 03/08/13 0920 03/09/13 0415 03/10/13 0615  WBC 25.4* 12.8* 11.0* 12.4*  NEUTROABS 22.3*  --   --   --   HGB 14.2 12.0 11.0* 12.4  HCT 41.2 35.7* 34.1* 37.4  MCV 93.6 95.5 96.9 94.0  PLT 240 186 180 223   Cardiac Enzymes:  Recent Labs Lab 03/07/13 1820 03/07/13 2247 03/08/13 0920  TROPONINI <0.30 <0.30 <0.30   CBG:  Recent Labs Lab 03/09/13 0800 03/09/13 1154 03/09/13 1711 03/09/13 2056 03/10/13 0729  GLUCAP 176* 265* 251* 288* 206*    Recent Results (from the past 240 hour(s))  CULTURE, BLOOD  (ROUTINE X 2)     Status: None   Collection Time    03/07/13  1:25 PM      Result Value Range Status   Specimen Description BLOOD ARM RIGHT   Final   Special Requests BOTTLES DRAWN AEROBIC ONLY 9CC   Final   Culture  Setup Time     Final   Value: 03/08/2013 00:21     Performed at [facility]   Culture     Final   Value:        BLOOD CULTURE RECEIVED NO GROWTH TO DATE CULTURE WILL BE HELD FOR 5 DAYS BEFORE ISSUING A FINAL NEGATIVE REPORT     Performed at [facility]   Report Status PENDING   Incomplete  URINE CULTURE     Status: None   Collection Time    03/07/13  1:45 PM      Result Value Range Status   Specimen Description URINE, RANDOM   Final   Special Requests NONE   Final   Culture  Setup Time     Final   Value: 03/07/2013 15:13     Performed at [facility] Count     Final   Value: >=100,000 COLONIES/ML     Performed at [facility]   Culture     Final   Value: KLEBSIELLA PNEUMONIAE     Performed at [facility]   Report Status 03/10/2013 FINAL   Final   Organism ID, Bacteria KLEBSIELLA PNEUMONIAE   Final  CULTURE, BLOOD (ROUTINE X 2)     Status: None   Collection Time    03/07/13  2:25 PM      Result Value Range Status   Specimen Description BLOOD ARM LEFT   Final   Special Requests BOTTLES DRAWN AEROBIC AND ANAEROBIC 10CC   Final   Culture  Setup Time     Final   Value: 03/07/2013 21:56     Performed at [facility]   Culture     Final   Value:        BLOOD CULTURE RECEIVED NO GROWTH TO DATE CULTURE WILL BE HELD FOR 5 DAYS BEFORE ISSUING A FINAL NEGATIVE REPORT     Performed at [facility]   Report Status PENDING   Incomplete  MRSA PCR SCREENING     Status: None   Collection Time    03/07/13  5:22 PM      Result Value Range Status   MRSA by PCR NEGATIVE  NEGATIVE Final   Comment:            The GeneXpert MRSA Assay (FDA     approved for NASAL specimens  only), is one component of a      comprehensive MRSA colonization     surveillance program. It is not     intended to diagnose MRSA     infection nor to guide or     monitor treatment for     MRSA infections.     Studies:  Recent x-ray studies have been reviewed in detail by the Attending Physician  Scheduled Meds:  Scheduled Meds: . atorvastatin  20 mg Oral q1800  . insulin aspart  0-20 Units Subcutaneous TID WC  . insulin aspart  0-5 Units Subcutaneous QHS  . insulin aspart protamine- aspart  25 Units Subcutaneous BID WC  . levalbuterol  0.63 mg Nebulization BID  . levothyroxine  150 mcg Oral QAC breakfast  . metoprolol  25 mg Oral BID  . nystatin   Topical BID  . piperacillin-tazobactam (ZOSYN)  IV  3.375 g Intravenous Q8H  . predniSONE  20 mg Oral Q breakfast  . warfarin  2.5 mg Oral ONCE-1800  . Warfarin - Pharmacist Dosing Inpatient   Does not apply q1800   Time spent on care of this patient:   [facility]  [facility] Office  [phone] Pager - Text Page per Loretha Stapler as per below:  On-Call/Text Page:      [email or web address]      password [email or web address]  If 7PM-7AM, please contact night-coverage [email or web address] Password [email or web address] 03/10/2013, 11:56 AM   LOS: 3 days

## 2013-03-10 NOTE — Progress Notes (Signed)
  Pharmacy Consult for Warfarin and Zosyn Indication: atrial fibrillation and pyelonephritis  Allergies  Allergen Reactions  . Ciprofloxacin     Nausea Possibly rash and itching- but not sure   . Gabapentin Nausea And Vomiting  . Insulin Glargine     Itching   . Latex     Rash   . Metformin And Related     Severe diarrhea   . Metoclopramide Hcl     REACTION: unspecified  . Nsaids     REACTION: unspecified  . Promethazine Hcl     REACTION: unspecified    Labs:  Recent Labs  03/07/13 1341 03/07/13 1820 03/07/13 2247 03/08/13 0920 03/08/13 1200 03/09/13 0415 03/10/13 0615  HGB 14.2  --   --  12.0  --  11.0* 12.4  HCT 41.2  --   --  35.7*  --  34.1* 37.4  PLT 240  --   --  186  --  180 223  LABPROT 22.7*  --   --  22.6* 22.8* 25.6* 23.3*  INR 2.08*  --   --  2.06* 2.09* 2.43* 2.15*  CREATININE 1.23*  --   --  1.03  --  1.03  --   TROPONINI  --  <0.30 <0.30 <0.30  --   --   --     Estimated Creatinine Clearance: 43.9 ml/min (by C-G formula based on Cr of 1.03).   Assessment: 79 y.o. F who presented to the [facility] on 03/07/13 with AMS and fevers and started on Vancomycin + Zosyn for empiric coverage of pyelonephritis. Vanc was discontinued on 8/9, and continued on Zosyn.  Urine Cx from 8/7 showed gram neg rods, and pt is MRSA screen neg.  Today WBC 12.4, Scr stable 1.03, Crcl 43.9, and T wnl.  Will continue current dose of Zosyn based on Crcl.  The patient is also on warfarin PTA for hx Afib and admitted with a therapeutic INR of 2.08 on a home dose of 2.5 mg daily EXCEPT for 5 mg on Mondays only. Pharmacy was consulted to resume warfarin dosing this admission. INR today= 2.15, and pt has been therapeutic inpatient for 4 days now.  The patient is on broad spectrum antibiotics that may increase warfarin sensitivity.   Goal of Therapy:  Resolution of infection INR 2-3  Plan:  -Continue Zosyn 3.375 g IV q8h -Warfarin 2.5 mg x 1 dose at 1800 today -Daily PT/INR -Will  continue to monitor for any signs/symptoms of bleeding and will follow up with PT/INR in the a.m.  -Continue monitoring Scr, Crcl  [name], PharmD Clinical Pharmacist Pager: [phone]

## 2013-03-10 NOTE — Evaluation (Signed)
Physical Therapy Evaluation Patient Details Name: [name] MRN: [id number] DOB: 1923-07-15 Today's Date: 03/10/2013 Time: 9562-1308 PT Time Calculation (min): 20 min  PT Assessment / Plan / Recommendation History of Present Illness  79 year old female presented to the ER with lethargy and altered mental status. Very poor historian. According to the ED notes the patient had not been feeling well for a couple of days.   In February the patient started developing skin rash described as pemphigoid-like lesions with blistering and bleeding from them.  The patient is on long-term anticoagulation due to a atrial fibrillation. Upon presentation the patient was found to be in A. fib with RVR with heart rate in the 140s.  She c/o vomiting and not feeling good & generalized pain.  Workup in the ER revealed an elevated lactic acid white count greater than 25,000.  Clinical Impression  Pt admitted with above. Pt currently with functional limitations due to the deficits listed below (see PT Problem List).  Pt will benefit from skilled PT to increase their independence and safety with mobility to allow discharge to the venue listed below.      PT Assessment  Patient needs continued PT services    Follow Up Recommendations  Home health PT;Supervision/Assistance - 24 hour    Equipment Recommendations  None recommended by PT    Frequency Min 3X/week    Precautions / Restrictions Precautions Precautions: Fall   Pertinent Vitals/Pain No c/o pain      Mobility  Bed Mobility Bed Mobility: Not assessed Details for Bed Mobility Assistance: pt up in chair Transfers Transfers: Sit to Stand;Stand to Sit Sit to Stand: 4: Min guard;From chair/3-in-1;With upper extremity assist;With armrests Stand to Sit: 4: Min guard;To chair/3-in-1;With armrests;With upper extremity assist Details for Transfer Assistance: Min guard for safety. VCs for safe hand placement and to control  descent. Ambulation/Gait Ambulation/Gait Assistance: 4: Min guard Ambulation Distance (Feet): 100 Feet Assistive device: Rolling walker Ambulation/Gait Assistance Details: Minguard for safety with max cues for RW placement and upright posture.  Pt tends to ambulate too far from RW. Gait Pattern: Step-through pattern;Decreased stride length;Trunk flexed Gait velocity: wfl Stairs: No    Exercises     PT Diagnosis: Difficulty walking  PT Problem List: Decreased activity tolerance;Decreased balance;Decreased mobility;Decreased knowledge of use of DME PT Treatment Interventions: Gait training;Functional mobility training;Therapeutic activities;Therapeutic exercise;Balance training;Patient/family education     PT Goals(Current goals can be found in the care plan section) Acute Rehab PT Goals Patient Stated Goal: to return home PT Goal Formulation: With patient Time For Goal Achievement: 03/17/13 Potential to Achieve Goals: Good  Visit Information  Last PT Received On: 03/10/13 Assistance Needed: +1 History of Present Illness: 79 year old female presented to the ER with lethargy and altered mental status. Very poor historian. According to the ED notes the patient had not been feeling well for a couple of days.   In February the patient started developing skin rash described as pemphigoid-like lesions with blistering and bleeding from them.  The patient is on long-term anticoagulation due to a atrial fibrillation. Upon presentation the patient was found to be in A. fib with RVR with heart rate in the 140s.  She c/o vomiting and not feeling good & generalized pain.  Workup in the ER revealed an elevated lactic acid white count greater than 25,000.       Prior Functioning  Home Living Family/patient expects to be discharged to:: Private residence Living Arrangements: Spouse/significant other Available Help at Discharge: Family Type  of Home: House Home Access: Ramped entrance Home Layout:  One level Home Equipment: Walker - 2 wheels Prior Function Level of Independence: Needs assistance ADL's / Homemaking Assistance Needed: Husband helps with cooking/cleaning Communication Communication: HOH Dominant Hand: Right    Cognition  Cognition Arousal/Alertness: Awake/alert Behavior During Therapy: WFL for tasks assessed/performed Overall Cognitive Status: Within Functional Limits for tasks assessed    Extremity/Trunk Assessment Upper Extremity Assessment Upper Extremity Assessment: Overall WFL for tasks assessed Lower Extremity Assessment Lower Extremity Assessment: Overall WFL for tasks assessed   Balance    End of Session PT - End of Session Equipment Utilized During Treatment: Gait belt Activity Tolerance: Patient tolerated treatment well Patient left: in chair;with call bell/phone within reach;with family/visitor present Nurse Communication: Mobility status  GP     [name] 03/10/2013, 4:49 PM  Jake Shark, PT DPT [phone]

## 2013-03-11 LAB — BASIC METABOLIC PANEL
BUN: 18 mg/dL (ref 6–23)
CO2: 27 mEq/L (ref 19–32)
Chloride: 102 mEq/L (ref 96–112)
Glucose, Bld: 303 mg/dL — ABNORMAL HIGH (ref 70–99)
Potassium: 3.9 mEq/L (ref 3.5–5.1)

## 2013-03-11 LAB — GLUCOSE, CAPILLARY: Glucose-Capillary: 294 mg/dL — ABNORMAL HIGH (ref 70–99)

## 2013-03-11 LAB — CBC
HCT: 37.1 % (ref 36.0–46.0)
Hemoglobin: 12.5 g/dL (ref 12.0–15.0)
MCH: 31.6 pg (ref 26.0–34.0)
MCHC: 33.7 g/dL (ref 30.0–36.0)
MCV: 93.9 fL (ref 78.0–100.0)

## 2013-03-11 MED ORDER — CEPHALEXIN 500 MG PO CAPS: 500.0000 mg | ORAL_CAPSULE | Freq: Three times a day (TID) | ORAL | Status: DC

## 2013-03-11 MED ORDER — WARFARIN SODIUM 5 MG PO TABS
5.0000 mg | ORAL_TABLET | Freq: Once | ORAL | Status: DC
  Filled 2013-03-11: qty 1

## 2013-03-11 MED ORDER — CEPHALEXIN 500 MG PO CAPS
500.0000 mg | ORAL_CAPSULE | Freq: Three times a day (TID) | ORAL | Status: DC
  Administered 2013-03-11: 500 mg via ORAL
  Filled 2013-03-11 (×3): qty 1

## 2013-03-11 NOTE — Progress Notes (Signed)
Inpatient Diabetes Program Recommendations  AACE/ADA: New Consensus Statement on Inpatient Glycemic Control (2013)  Target Ranges:  Prepandial:   less than 140 mg/dL      Peak postprandial:   less than 180 mg/dL (1-2 hours)      Critically ill patients:  140 - 180 mg/dL     Results for LYNISE, PORR (MRN [id number]) as of 03/11/2013 09:46  Ref. Range 03/10/2013 07:29 03/10/2013 11:34 03/10/2013 17:31 03/10/2013 21:08  Glucose-Capillary Latest Range: 70-99 mg/dL 409 (H) 811 (H) 914 (H) 291 (H)    Results for MELAYA, HOSELTON (MRN [id number]) as of 03/11/2013 09:46  Ref. Range 03/11/2013 07:43  Glucose-Capillary Latest Range: 70-99 mg/dL 086 (H)    **Patient currently only on 1/2 home dose of 70/30 insulin.  CBGs all >200 mg/dl   **MD- Please consider increasing 70/30 insulin to 30 units bid with meals   Will follow. Ambrose Finland RN, MSN, CDE Diabetes Coordinator Inpatient Diabetes Program [phone]

## 2013-03-11 NOTE — Discharge Summary (Signed)
PATIENT DETAILS Name: [name] Age: 79 y.o. Sex: female Date of Birth: [birth year] MRN: [id number]. Admit Date: 03/07/2013 Admitting Physician: Richarda Overlie, MD NWG:NFAOZ Tower, MD  Recommendations for Outpatient Follow-up:  1. Homehealth PT at least 3X/week. Fall Precautions. 2. Follow Up with Dr. Terri Piedra for wound check/bullous lesions within one week. 3. Follow Up with Dr. Milinda Antis for DM and pyelonephritis within one week.   PRIMARY DISCHARGE DIAGNOSIS:  Principal Problem:   Acute pyelonephritis Active Problems:   DIABETES MELLITUS, TYPE II   Atrial fibrillation with RVR   Blister      PAST MEDICAL HISTORY: Past Medical History  Diagnosis Date  . Atrial fibrillation   . GERD (gastroesophageal reflux disease)   . Hyperlipidemia   . Hypertension   . Hypothyroid   . Obesity   . Vaginal dysplasia 1980  . Rosacea   . Chest pain 11/2002    cardiac cath neg   . Back pain   . IBS (irritable bowel syndrome)     with diarrhea  . Asthma     as a child  . Adenomatous colon polyp 12/2007  . Hemorrhoids   . Vertigo   . Hiatal hernia 12/2007    EGD  . CHF (congestive heart failure)   . Complication of anesthesia     "dr said I should never be put to sleep again" (03/07/2013)  . Exertional shortness of breath   . Type II diabetes mellitus   . Iron deficiency anemia   . Arthritis     "all over my body" (03/07/2013)  . Vaginal cancer     DISCHARGE MEDICATIONS:   Medication List    ASK your doctor about these medications       acetaminophen 500 MG tablet  Commonly known as:  TYLENOL  Take 500 mg by mouth every 6 (six) hours as needed for pain. For pain     BD PEN NEEDLE NANO U/F 32G X 4 MM Misc  Generic drug:  Insulin Pen Needle  USE AS DIRECTED WITH INSULIN PEN     glucose blood test strip  Commonly known as:  ONE TOUCH ULTRA TEST  Check sugars 2x a day     insulin NPH-regular (70-30) 100 UNIT/ML injection  Commonly known as:  HUMULIN 70/30  Inject under  skin 50 units 30 min before breakfast and dinner. Pens please.     Insulin Syringe-Needle U-100 30G X 1/2" 0.5 ML Misc  Commonly known as:  B-D INS SYRINGE 0.5CC/30GX1/2"  Inject 2-3 x a day     levothyroxine 150 MCG tablet  Commonly known as:  SYNTHROID, LEVOTHROID  Take 1 tablet (150 mcg total) by mouth daily.     meclizine 25 MG tablet  Commonly known as:  ANTIVERT  Take 25 mg by mouth 3 (three) times daily as needed. For dizziness     metoprolol 50 MG tablet  Commonly known as:  LOPRESSOR  Take 0.5 tablets (25 mg total) by mouth 2 (two) times daily.     omeprazole 20 MG capsule  Commonly known as:  PRILOSEC  Take 1 capsule (20 mg total) by mouth daily.     potassium chloride SA 20 MEQ tablet  Commonly known as:  K-DUR,KLOR-CON  Take 1 tablet (20 mEq total) by mouth daily.     PREDNISONE PO  Take 4 tablets by mouth daily. Tapered dosing     silver sulfADIAZINE 1 % cream  Commonly known as:  SILVADENE  Apply topically daily.  simvastatin 40 MG tablet  Commonly known as:  ZOCOR  Take 1 tablet (40 mg total) by mouth at bedtime.     traMADol 50 MG tablet  Commonly known as:  ULTRAM  1-2 pills by mouth up to three times daily as needed for severe pain     warfarin 5 MG tablet  Commonly known as:  COUMADIN  Take 2.5-5 mg by mouth See admin instructions. Takes 5mg  on Monday & 2.5mg  on all other days        ALLERGIES:   Allergies  Allergen Reactions  . Ciprofloxacin     Nausea Possibly rash and itching- but not sure   . Gabapentin Nausea And Vomiting  . Insulin Glargine     Itching   . Latex     Rash   . Metformin And Related     Severe diarrhea   . Metoclopramide Hcl     REACTION: unspecified  . Nsaids     REACTION: unspecified  . Promethazine Hcl     REACTION: unspecified    BRIEF HPI: 79 yo female presented to the ED by EMS, after her husband couldn't wake her up, on 03/07/13 with lethargy and altered mental status. She was found to be in A. Fib  with RVR (140s) and complained of "not feeling good" and "pain all over".  She had 2 episodes of vomiting prior to arrival.  She had one episode of fecal incontinence prior to EMS arrival.  She had a WBC >25,000 and was admitted for sepsis and UTI. She also has multiple bullous lesions that have been present since February.    CONSULTATIONS:   None  PERTINENT RADIOLOGIC STUDIES: Ct Head Wo Contrast  03/07/2013   *RADIOLOGY REPORT*  Clinical Data: Altered mental status, fever  CT HEAD WITHOUT CONTRAST  Technique:  Contiguous axial images were obtained from the base of the skull through the vertex without contrast.  Comparison: 05/22/2012  Findings: Generalized atrophy with enlargement of the ventricles and subarachnoid space.  This is unchanged.  Moderate chronic microvascular ischemic type changes in the white matter also unchanged.  Negative for acute infarct, hemorrhage, or mass.  No edema or midline shift.  Calvarium is intact.  IMPRESSION: Atrophy and chronic microvascular ischemia.  No acute abnormality.   Original Report Authenticated By: Janeece Riggers, M.D.   Ct Tibia Fibula Right W Contrast  03/08/2013   *RADIOLOGY REPORT*  Clinical Data: Blister medial and anterior aspect of the right lower leg.  CT OF THE RIGHT TIBIA AND FIBULA WITH CONTRAST  Contrast: 80mL OMNIPAQUE IOHEXOL 300 MG/ML  SOLN  Comparison: None.  Findings: Infiltration of subcutaneous fat is seen about the right lower leg.  No focal fluid collection is identified.  All visualized musculature demonstrates atrophy without focal lesion or fluid collection.  The patient is status post right knee replacement and surgical repair of medial and lateral malleolar fractures.  No acute fracture is identified.  No focal bony lesion is seen.  Bones appear osteopenic.  IMPRESSION: Infiltration of subcutaneous fat compatible with cellulitis or dependent change.  Negative for abscess or CT evidence of osteomyelitis.   Original Report Authenticated By:  Holley Dexter, M.D.   Dg Chest Port 1 View  03/07/2013   *RADIOLOGY REPORT*  Clinical Data: Mental status.  Fever.  PORTABLE CHEST - 1 VIEW  Comparison: 06/17/2012.  Findings: 1206 hours.  There are low lung volumes with new patchy left lower lobe air space disease.  The previously demonstrated left upper  lobe infiltrate has resolved.  The heart size and mediastinal contours are stable.  There is diffuse aortic atherosclerosis.  IMPRESSION: New patchy left lower lobe atelectasis or infiltrate, possibly on the basis of aspiration.   Original Report Authenticated By: Carey Bullocks, M.D.   Dg Swallowing Func-speech Pathology  03/08/2013   Breck Coons [place], CCC-SLP     03/08/2013  4:04 PM Objective Swallowing Evaluation: Modified Barium Swallowing Study   Patient Details  Name: TIPPI MCCRAE MRN: [id number] Date of Birth: 07-Aug-1922  Today's Date: 03/08/2013 Time: 1015-1030 SLP Time Calculation (min): 15 min  Past Medical History:  Past Medical History  Diagnosis Date  . Atrial fibrillation   . GERD (gastroesophageal reflux disease)   . Hyperlipidemia   . Hypertension   . Hypothyroid   . Obesity   . Vaginal dysplasia 1980  . Rosacea   . Chest pain 11/2002    cardiac cath neg   . Back pain   . IBS (irritable bowel syndrome)     with diarrhea  . Asthma     as a child  . Adenomatous colon polyp 12/2007  . Hemorrhoids   . Vertigo   . Hiatal hernia 12/2007    EGD  . CHF (congestive heart failure)   . Complication of anesthesia     "dr said I should never be put to sleep again" (03/07/2013)  . Exertional shortness of breath   . Type II diabetes mellitus   . Iron deficiency anemia   . Arthritis     "all over my body" (03/07/2013)  . Vaginal cancer    Past Surgical History:  Past Surgical History  Procedure Laterality Date  . Cholecystectomy  2006  . Joint replacement      Rt TKR 1993 & Lt TKR 2001  . Cervical cone biopsy  1963    D&C  . Cystocele repair  1978    /rectocele  . Orif ankle fracture Right 2006  . Total knee  arthroplasty Right 1993  . Total knee arthroplasty Left 2001  . Cataract extraction w/ intraocular lens  implant, bilateral  Bilateral 2006  . Ankle fracture surgery Right 2011    fall/followed by rehab stay; "hand to have 9 pins put in it"  (03/07/2013)  . Cardiac catheterization  11/2002    [name] 12/11/2002 (03/07/2013)  . Appendectomy  1941    [name] 11/17/1999 (03/07/2013)  . Abdominal hysterectomy  1963    partial/notes 11/17/1999 (03/07/2013   HPI:  79 year old female presented to the ER with lethargy and altered  mental status. PMH of GERD, DM, HTN, IBS, Hiatal hernia, obesity,  and hypothyroid. Very poor historian. According to the ED notes  the patient has not been feeling well for the last couple of  days. Upon presentation the patient was also found to be in A.  fib with RVR with heart rate in the 140s. Workup in the ER  revealed an elevated lactic acid white count of greater than  25,000, she is being admitted for sepsis. CXR Impression  (03/07/13): New patchy left lower lobe atelectasis or infiltrate,  possibly on the basis of aspiration.  MBS recommended followiing  BSE.     Assessment / Plan / Recommendation Clinical Impression  Dysphagia Diagnosis: Mild pharyngeal phase dysphagia Clinical impression: Pt. exhibited min-mild pharyngeal residue as  a result of decreased laryngeal elevation leading to mild  pyriform sinus residue.  Pt. appears to have shallow pyriform  sinsuses therefore is at higher aspiration risk post  swallow from  residue.  Verbal cues to swallow twice was effective in clearing  residue.  No penetration or aspiration observed.  Esophagus  brielfy observed without abnormalities seen (no radiologist  present to confirm).  Recommend she initiate a regular diet  texture with thin liquids, straws ok and pills with water.  ST  will follow for safety and efficiency with po's.    Treatment Recommendation  Therapy as outlined in treatment plan below    Diet Recommendation Regular;Thin liquid   Liquid  Administration via: Straw;Cup Medication Administration: Whole meds with liquid Supervision: Patient able to self feed;Intermittent supervision  to cue for compensatory strategies Compensations: Small sips/bites;Check for pocketing;Multiple dry  swallows after each bite/sip Postural Changes and/or Swallow Maneuvers: Seated upright 90  degrees;Upright 30-60 min after meal    Other  Recommendations Oral Care Recommendations: Oral care BID   Follow Up Recommendations  None    Frequency and Duration min 2x/week  2 weeks   Pertinent Vitals/Pain none    SLP Swallow Goals Patient will utilize recommended strategies during swallow to  increase swallowing safety with: Supervision/safety      Reason for Referral Objectively evaluate swallowing function   Oral Phase Oral Preparation/Oral Phase Oral Phase: WFL   Pharyngeal Phase Pharyngeal Phase Pharyngeal Phase: Impaired Pharyngeal - Nectar Pharyngeal - Nectar Teaspoon: Delayed swallow  initiation;Premature spillage to valleculae Pharyngeal - Nectar Cup: Pharyngeal residue - pyriform  sinuses;Reduced laryngeal elevation;Delayed swallow  initiation;Premature spillage to valleculae Pharyngeal - Thin Pharyngeal - Thin Cup: Pharyngeal residue - pyriform  sinuses;Reduced laryngeal elevation Pharyngeal - Thin Straw: Pharyngeal residue - pyriform  sinuses;Reduced laryngeal elevation  Cervical Esophageal Phase    GO    Cervical Esophageal Phase Cervical Esophageal Phase: [facility]         Darrow Bussing.Ed CCC-SLP Pager [phone]  03/08/2013     PERTINENT LAB RESULTS: CBC:  Recent Labs  03/10/13 0615 03/11/13 0740  WBC 12.4* 9.9  HGB 12.4 12.5  HCT 37.4 37.1  PLT 223 220   CMET CMP     Component Value Date/Time   NA 140 03/11/2013 0740   K 3.9 03/11/2013 0740   CL 102 03/11/2013 0740   CO2 27 03/11/2013 0740   GLUCOSE 303* 03/11/2013 0740   BUN 18 03/11/2013 0740   CREATININE 0.95 03/11/2013 0740   CALCIUM 9.5 03/11/2013 0740   PROT 5.9* 03/08/2013 0920   ALBUMIN 2.4*  03/08/2013 0920   AST 12 03/08/2013 0920   ALT 9 03/08/2013 0920   ALKPHOS 59 03/08/2013 0920   BILITOT 0.9 03/08/2013 0920   GFRNONAA 52* 03/11/2013 0740   GFRAA 60* 03/11/2013 0740    GFR Estimated Creatinine Clearance: 47.6 ml/min (by C-G formula based on Cr of 0.95). No results found for this basename: LIPASE, AMYLASE,  in the last 72 hours No results found for this basename: CKTOTAL, CKMB, CKMBINDEX, TROPONINI,  in the last 72 hours No components found with this basename: POCBNP,  No results found for this basename: DDIMER,  in the last 72 hours No results found for this basename: HGBA1C,  in the last 72 hours No results found for this basename: CHOL, HDL, LDLCALC, TRIG, CHOLHDL, LDLDIRECT,  in the last 72 hours No results found for this basename: TSH, T4TOTAL, FREET3, T3FREE, THYROIDAB,  in the last 72 hours No results found for this basename: VITAMINB12, FOLATE, FERRITIN, TIBC, IRON, RETICCTPCT,  in the last 72 hours Coags:  Recent Labs  03/10/13 0615 03/11/13 0740  INR 2.15* 2.14*   Microbiology: Recent Results (from the past 240 hour(s))  CULTURE, BLOOD (ROUTINE X 2)     Status: None   Collection Time    03/07/13  1:25 PM      Result Value Range Status   Specimen Description BLOOD ARM RIGHT   Final   Special Requests BOTTLES DRAWN AEROBIC ONLY 9CC   Final   Culture  Setup Time     Final   Value: 03/08/2013 00:21     Performed at [facility]   Culture     Final   Value:        BLOOD CULTURE RECEIVED NO GROWTH TO DATE CULTURE WILL BE HELD FOR 5 DAYS BEFORE ISSUING A FINAL NEGATIVE REPORT     Performed at [facility]   Report Status PENDING   Incomplete  URINE CULTURE     Status: None   Collection Time    03/07/13  1:45 PM      Result Value Range Status   Specimen Description URINE, RANDOM   Final   Special Requests NONE   Final   Culture  Setup Time     Final   Value: 03/07/2013 15:13     Performed at [facility] Count     Final    Value: >=100,000 COLONIES/ML     Performed at [facility]   Culture     Final   Value: KLEBSIELLA PNEUMONIAE     Performed at [facility]   Report Status 03/10/2013 FINAL   Final   Organism ID, Bacteria KLEBSIELLA PNEUMONIAE   Final  CULTURE, BLOOD (ROUTINE X 2)     Status: None   Collection Time    03/07/13  2:25 PM      Result Value Range Status   Specimen Description BLOOD ARM LEFT   Final   Special Requests BOTTLES DRAWN AEROBIC AND ANAEROBIC 10CC   Final   Culture  Setup Time     Final   Value: 03/07/2013 21:56     Performed at [facility]   Culture     Final   Value:        BLOOD CULTURE RECEIVED NO GROWTH TO DATE CULTURE WILL BE HELD FOR 5 DAYS BEFORE ISSUING A FINAL NEGATIVE REPORT     Performed at [facility]   Report Status PENDING   Incomplete  MRSA PCR SCREENING     Status: None   Collection Time    03/07/13  5:22 PM      Result Value Range Status   MRSA by PCR NEGATIVE  NEGATIVE Final   Comment:            The GeneXpert MRSA Assay (FDA     approved for NASAL specimens     only), is one component of a     comprehensive MRSA colonization     surveillance program. It is not     intended to diagnose MRSA     infection nor to guide or     monitor treatment for     MRSA infections.     BRIEF HOSPITAL COURSE:  Sepsis:  The patient presents to the hospital with a heart rate of more than 90 and respiratory rate more than 20, leukocytosis with pyelonephritis, to lactic acid was slightly elevated at 2.72. Initially she was placed on empiric IV antibiotics including vancomycin and Zosyn. The urine culture comes back positive for  Klebsiella pneumoniae so antibiotics were switched to Ceftin, on discharge Keflex for 10 more days to complete a total of 2 weeks of antibiotics.  Acute Pyelonephritis:  Secondary to Klebsiella pneumoniae, as mentioned above she was on Zosyn, on discharge Keflex 500 mg 3 times a day to complete 14 days of  antibiotics.  Altered mental status: Patient presents to the hospital with lethargy and altered mental status, this is resolved prior to discharge. This is likely secondary to her sepsis/UTI picture.  Atrial fibrillation with RVR: At the time of admission to the hospital her rate was in the 140s, patient has chronic atrial fibrillation but controlled heart rate. The rapid ventricular response was secondary to her sepsis/pyelonephritis.  Bullous Pemphigoid: Continued Prednisone taper.  Patient to continue her home prescription as written.  She has an open lesion on her right knee and has been instructed to keep it clean and dry.    TODAY-DAY OF DISCHARGE:  Subjective:   [name]  has no fever/chills/abdominal pain/chest pain/SOB.  She feels much better and wants to go home today.  Objective:   Blood pressure 157/87, pulse 84, temperature 97.4 F (36.3 C), temperature source Oral, resp. rate 18, height 5\' 2"  (1.575 m), weight 109.2 kg (240 lb 11.9 oz), last menstrual period 09/01/1961, SpO2 96.00%.  Intake/Output Summary (Last 24 hours) at 03/11/13 0954 Last data filed at 03/10/13 1453  Gross per 24 hour  Intake    535 ml  Output      0 ml  Net    535 ml   Filed Weights   03/07/13 1705  Weight: 109.2 kg (240 lb 11.9 oz)    Exam General: Awake Alert, Oriented x 3, No new F.N deficits, Normal affect HEENT: PERRLA Neck: Supple Neck, No JVD  Pulm: Symmetrical Chest wall movement, Good air movement bilaterally, CTAB CV: Regular rate, irregular rhythym, no MGR Abd: NBS, SNT, No organomegaly appriciated, No rebound -guarding or rigidity. Rash noted under breasts and under pannus. Extremities: 2+ edema on right LE, trace edema on left LE  DISCHARGE CONDITION: Stable  DISPOSITION: Home with home health services   DISCHARGE INSTRUCTIONS:    Activity:  As tolerated with Full fall precautions, use walker & assistance as needed.  PT home health to follow.  Diet  recommendation: Diabetic Diet Heart Healthy diet      Total Time spent on discharge equals 45 minutes.  Signed: Gerrit Friends PA-S 03/11/2013 9:54 AM   Addendum  Patient seen and examined, chart and data base reviewed.  I agree with the above assessment and plan.  For full details please see Ms Gerrit Friends PA-S note.  I reviewed the above note and I addended it as needed.   Clint Lipps, MD [facility] Pager: [phone] 03/11/2013, 1:09 PM

## 2013-03-11 NOTE — Progress Notes (Addendum)
[name] Morning discharged Home per MD order.  Discharge instructions reviewed and discussed with the patient, all questions and concerns answered. Copy of instructions and scripts given to patient.    Medication List         acetaminophen 500 MG tablet  Commonly known as:  TYLENOL  Take 500 mg by mouth every 6 (six) hours as needed for pain. For pain     BD PEN NEEDLE NANO U/F 32G X 4 MM Misc  Generic drug:  Insulin Pen Needle  USE AS DIRECTED WITH INSULIN PEN     cephALEXin 500 MG capsule  Commonly known as:  KEFLEX  Take 1 capsule (500 mg total) by mouth every 8 (eight) hours.     glucose blood test strip  Commonly known as:  ONE TOUCH ULTRA TEST  Check sugars 2x a day     insulin NPH-regular (70-30) 100 UNIT/ML injection  Commonly known as:  HUMULIN 70/30  Inject under skin 50 units 30 min before breakfast and dinner. Pens please.     Insulin Syringe-Needle U-100 30G X 1/2" 0.5 ML Misc  Commonly known as:  B-D INS SYRINGE 0.5CC/30GX1/2"  Inject 2-3 x a day     levothyroxine 150 MCG tablet  Commonly known as:  SYNTHROID, LEVOTHROID  Take 1 tablet (150 mcg total) by mouth daily.     meclizine 25 MG tablet  Commonly known as:  ANTIVERT  Take 25 mg by mouth 3 (three) times daily as needed. For dizziness     metoprolol 50 MG tablet  Commonly known as:  LOPRESSOR  Take 0.5 tablets (25 mg total) by mouth 2 (two) times daily.     omeprazole 20 MG capsule  Commonly known as:  PRILOSEC  Take 1 capsule (20 mg total) by mouth daily.     potassium chloride SA 20 MEQ tablet  Commonly known as:  K-DUR,KLOR-CON  Take 1 tablet (20 mEq total) by mouth daily.     PREDNISONE PO  Take 4 tablets by mouth daily. Tapered dosing     silver sulfADIAZINE 1 % cream  Commonly known as:  SILVADENE  Apply topically daily.     simvastatin 40 MG tablet  Commonly known as:  ZOCOR  Take 1 tablet (40 mg total) by mouth at bedtime.     traMADol 50 MG tablet  Commonly known as:  ULTRAM   1-2 pills by mouth up to three times daily as needed for severe pain     warfarin 5 MG tablet  Commonly known as:  COUMADIN  Take 2.5-5 mg by mouth See admin instructions. Takes 5mg  on Monday & 2.5mg  on all other days        Patients skin is clean, dry with several bullous pemphigoid all over body, mostly scab over and one blister to right leg, allyvn dressing to right knee where blister had busted, no evidence of skin break down. IV site discontinued and catheter remains intact. Site without signs and symptoms of complications. Dressing and pressure applied.  Patient escorted to car by NT in a wheelchair,  no distress noted upon discharge.  Laural Blackwell, Cheryl Trice C 03/11/2013 4:06 PM

## 2013-03-11 NOTE — Progress Notes (Signed)
ANTICOAGULATION CONSULT NOTE - Follow Up Consult  Pharmacy Consult for warfarin Indication: atrial fibrillation  Allergies  Allergen Reactions  . Ciprofloxacin     Nausea Possibly rash and itching- but not sure   . Gabapentin Nausea And Vomiting  . Insulin Glargine     Itching   . Latex     Rash   . Metformin And Related     Severe diarrhea   . Metoclopramide Hcl     REACTION: unspecified  . Nsaids     REACTION: unspecified  . Promethazine Hcl     REACTION: unspecified    Patient Measurements: Height: 5\' 2"  (157.5 cm) Weight: 240 lb 11.9 oz (109.2 kg) IBW/kg (Calculated) : 50.1   Vital Signs: Temp: 97.4 F (36.3 C) (08/11 0656) Temp src: Oral (08/11 0656) BP: 170/105 mmHg (08/11 1008) Pulse Rate: 85 (08/11 1008)  Labs:  Recent Labs  03/09/13 0415 03/10/13 0615 03/11/13 0740  HGB 11.0* 12.4 12.5  HCT 34.1* 37.4 37.1  PLT 180 223 220  LABPROT 25.6* 23.3* 23.2*  INR 2.43* 2.15* 2.14*  CREATININE 1.03 0.95 0.95    Estimated Creatinine Clearance: 47.6 ml/min (by C-G formula based on Cr of 0.95).   Medications:  Scheduled:  . atorvastatin  20 mg Oral q1800  . cephALEXin  500 mg Oral Q8H  . insulin aspart  0-20 Units Subcutaneous TID WC  . insulin aspart  0-5 Units Subcutaneous QHS  . insulin aspart protamine- aspart  25 Units Subcutaneous BID WC  . levalbuterol  0.63 mg Nebulization BID  . levothyroxine  150 mcg Oral QAC breakfast  . metoprolol  25 mg Oral BID  . nystatin   Topical BID  . predniSONE  20 mg Oral Q breakfast  . Warfarin - Pharmacist Dosing Inpatient   Does not apply q1800    Assessment: 52 YOF remains therapeutic on warfarin with INR this morning 2.14. Home dosing is 2.5mg  all days except 5mg  on Mondays. No bleeding noted. CBC is WNL and stable. Patient likely to be discharged today.  Goal of Therapy:  INR 2-3 Monitor platelets by anticoagulation protocol: Yes   Plan:  1. Warfarin 5mg  po x1 tonight to continue with home  dosing 2. Daily PT/INR if not discharged today 3. INR follow up as an outpt in ~1 week after discharge  Juwan Vences D. Gemma Ruan, PharmD Clinical Pharmacist Pager: [phone] 03/11/2013 11:22 AM

## 2013-03-12 NOTE — Progress Notes (Deleted)
Bedside Swallow Evaluation  Late entry for 03/08/13  03/08/13 0800  SLP Visit Information  SLP Received On 03/08/13  SLP Time Calculation  SLP Start Time 0818  SLP Stop Time 0846  SLP Time Calculation (min) 28 min  General Information  HPI 79 year old female presented to the ER with lethargy and altered mental status. PMH of GERD, DM, HTN, IBS, Hiatal hernia, obesity, and hypothyroid. Very poor historian. According to the ED notes the patient has not been feeling well for the last couple of days. Upon presentation the patient was also found to be in A. fib with RVR with heart rate in the 140s. Workup in the ER revealed an elevated lactic acid white count of greater than 25,000, she is being admitted for sepsis. CXR Impression (03/07/13): New patchy left lower lobe atelectasis or infiltrate, possibly on the basis of aspiration.  Type of Study Bedside swallow evaluation  Diet Prior to this Study Regular;Thin liquids  Temperature Spikes Noted No  Respiratory Status Supplemental O2 delivered via (comment)  History of Recent Intubation No  Behavior/Cognition Alert;Hard of hearing  Oral Cavity - Dentition Adequate natural dentition  Self-Feeding Abilities Able to feed self  Patient Positioning Upright in bed  Baseline Vocal Quality Clear  Volitional Cough Strong  Volitional Swallow Able to elicit  Oral Assessment (Complete on admission/transfer/change in patient condition)  Does patient have any of the following "high risk" factors? None of the above  Does patient have any of the following "at risk" factors? Oxygen therapy - cannula, mask, simple oxygen devices  Oral Motor/Sensory Function  Overall Oral Motor/Sensory Function Appears within functional limits for tasks assessed  Thin Liquid  Thin Liquid Impaired  Presentation Cup  Pharyngeal  Phase Impairments Multiple swallows;Wet Vocal Quality;Throat Clearing - Delayed;Cough - Delayed;Suspected delayed Swallow  Nectar Thick Liquid  Nectar  Thick Liquid NT  Honey Thick Liquid  Honey Thick Liquid NT  Puree  Puree WFL  Presentation Spoon  Solid  Solid NT  SLP - End of Session  Patient left in bed;with call bell/phone within reach;with family/visitor present  Nurse Communication Aspiration precautions reviewed;Diet recommendation;Swallow strategies reviewed  Suspected Esophageal Findings  Suspected Esophageal Findings Belching  SLP Assessment  Clinical Impression Statement Pt presents with moderate oropharyngeal dysphagia characterized by suspected delay in swallow initiation and s/s of aspiration/penetration with thin liquids. Clinician provided oral care prior to po trials and edu to pt and family regarding safe swallowing strategies and precautions. During trials of thin liquids, pt with suspected aspiration/penetration demonstrated by cough and throat clear. Secondary to risk for aspiration PNA, recommend MBSS to objectively identify swallow function and least restrictive diet.  Risk for Aspiration Moderate  Other Related Risk Factors History of GERD  Swallow Evaluation Recommendations  Recommended Consults MBS  Diet Recommendations NPO  Oral Care Recommendations Oral care BID  Treatment Plan  Treatment Plan Recommendations Other (Comment) (Defer tx plan until after MBSS)  Interventions Aspiration precaution training;Patient/family education  Prognosis  Prognosis for Safe Diet Advancement Good  Individuals Consulted  Consulted and Agree with Results and Recommendations Patient;Family member/caregiver  Family Member Consulted Son  Report Sent to  Referring physician  SLP Evaluations  $ SLP Speech Visit 1 Procedure  SLP Evaluations  $BSS Swallow 1 Procedure  $Self Care/Home Management 8-22

## 2013-03-13 ENCOUNTER — Ambulatory Visit: Payer: Medicare Other | Admitting: Internal Medicine

## 2013-03-13 ENCOUNTER — Telehealth: Payer: Self-pay | Admitting: Family Medicine

## 2013-03-13 LAB — CULTURE, BLOOD (ROUTINE X 2): Culture: NO GROWTH

## 2013-03-13 NOTE — Telephone Encounter (Signed)
Pt would like for Dr. Elvera Lennox to review her glucose readings in her chart from her recent hospital admission (was d/c from [facility] 03/18/2013) and let her know when Dr. Elvera Lennox would like for her to make a follow up apptmt. Thank you.

## 2013-03-13 NOTE — Telephone Encounter (Signed)
Pt called in and says she was hospitalized last Thursday and was d/c on Monday, 03/11/2013.  She would like for you to review her INR readings and let her know when you need her to come in for her next INR check. Thank you.

## 2013-03-13 NOTE — Telephone Encounter (Signed)
Correction, discharge date was 03/11/2013

## 2013-03-13 NOTE — Telephone Encounter (Signed)
Please read note below and advise. Thank you.  

## 2013-03-13 NOTE — Telephone Encounter (Signed)
Reviewed sugars >> 200-300, but this is not necessarily representative of what she had at home, since she was sick and was on a different med regimen in the hospital. Her HbA1C has dropped by ~ 3% since 10/2012, which is excellent. She needs to resume home regimen and will see her as originally scheduled.

## 2013-03-13 NOTE — Telephone Encounter (Signed)
Left vm for pt to call office

## 2013-03-14 ENCOUNTER — Ambulatory Visit: Payer: Medicare Other

## 2013-03-14 ENCOUNTER — Telehealth: Payer: Self-pay | Admitting: *Deleted

## 2013-03-14 LAB — CULTURE, BLOOD (ROUTINE X 2): Culture: NO GROWTH

## 2013-03-14 NOTE — Telephone Encounter (Signed)
Appt scheduled for 8/25.

## 2013-03-14 NOTE — Telephone Encounter (Signed)
Called pt and advised her that Dr Elvera Lennox has reviewed her sugars >> 200-300, but this is not necessarily representative of what she had at home, since she was sick and was on a different med regimen in the hospital. Her HbA1C has dropped by ~ 3% since 10/2012, which is excellent. She needs to resume home regimen and will see her as originally scheduled. Pt states she is on prednisone at this time and she is not eating like she should. Advised pt to keep an eye on her glucose readings and if they go up, please call me and let me know what they are. Pt stated she understood. I also told her that I would get with Rose and get a follow up appt scheduled for her to see Dr Elvera Lennox.

## 2013-03-16 NOTE — ED Provider Notes (Signed)
Medical screening examination/treatment/procedure(s) were conducted as a shared visit with non-physician practitioner(s) and myself.  I personally evaluated the patient during the encounter  Pt with concern for sepsis  But no signs of significant septic shock, numerous possible etiologies. Admit for further evaluation  Charles B. Bernette Mayers, MD 03/16/13 1414

## 2013-03-20 ENCOUNTER — Telehealth: Payer: Self-pay | Admitting: *Deleted

## 2013-03-20 NOTE — Telephone Encounter (Signed)
Please schedule a follow up appt for pt, with Dr Elvera Lennox. Thank you.

## 2013-03-22 ENCOUNTER — Encounter: Payer: Self-pay | Admitting: Radiology

## 2013-03-25 ENCOUNTER — Ambulatory Visit: Payer: Medicare Other | Admitting: Family Medicine

## 2013-03-25 ENCOUNTER — Ambulatory Visit: Payer: Medicare Other

## 2013-04-01 DIAGNOSIS — M2550 Pain in unspecified joint: Secondary | ICD-10-CM

## 2013-04-01 DIAGNOSIS — IMO0001 Reserved for inherently not codable concepts without codable children: Secondary | ICD-10-CM

## 2013-04-01 DIAGNOSIS — I4891 Unspecified atrial fibrillation: Secondary | ICD-10-CM

## 2013-04-01 DIAGNOSIS — R269 Unspecified abnormalities of gait and mobility: Secondary | ICD-10-CM

## 2013-04-03 ENCOUNTER — Ambulatory Visit: Payer: Medicare Other | Admitting: Internal Medicine

## 2013-04-08 ENCOUNTER — Ambulatory Visit (INDEPENDENT_AMBULATORY_CARE_PROVIDER_SITE_OTHER): Payer: Medicare Other | Admitting: Family Medicine

## 2013-04-08 ENCOUNTER — Encounter: Payer: Self-pay | Admitting: Family Medicine

## 2013-04-08 VITALS — BP 124/80 | HR 80 | Temp 98.2°F | Ht 62.0 in

## 2013-04-08 DIAGNOSIS — N12 Tubulo-interstitial nephritis, not specified as acute or chronic: Secondary | ICD-10-CM | POA: Insufficient documentation

## 2013-04-08 DIAGNOSIS — Z7901 Long term (current) use of anticoagulants: Secondary | ICD-10-CM

## 2013-04-08 DIAGNOSIS — L120 Bullous pemphigoid: Secondary | ICD-10-CM | POA: Insufficient documentation

## 2013-04-08 DIAGNOSIS — Z5181 Encounter for therapeutic drug level monitoring: Secondary | ICD-10-CM

## 2013-04-08 DIAGNOSIS — I4891 Unspecified atrial fibrillation: Secondary | ICD-10-CM

## 2013-04-08 DIAGNOSIS — L129 Pemphigoid, unspecified: Secondary | ICD-10-CM

## 2013-04-08 DIAGNOSIS — B372 Candidiasis of skin and nail: Secondary | ICD-10-CM

## 2013-04-08 LAB — POCT URINALYSIS DIPSTICK
Glucose, UA: NEGATIVE
Spec Grav, UA: 1.02
Urobilinogen, UA: 0.2

## 2013-04-08 LAB — POCT UA - MICROSCOPIC ONLY

## 2013-04-08 MED ORDER — NYSTATIN 100000 UNIT/GM EX CREA: TOPICAL_CREAM | Freq: Two times a day (BID) | CUTANEOUS | Status: DC

## 2013-04-08 NOTE — Assessment & Plan Note (Signed)
Improving on prednisone  Pt is very frustrated with this Sent for last dermatology note  Reassured that she is improving

## 2013-04-08 NOTE — Patient Instructions (Addendum)
I'm glad you are doing better  Please give a urine sample on the way out  Skin lesions look a lot better  I think under your breasts you have a yeast infection- try the nystatin cream for that  (I will send it to your pharmacy) Keep areas under breasts as clean and dry as you can  Please stop up front to send for last note from Dr Terri Piedra

## 2013-04-08 NOTE — Assessment & Plan Note (Signed)
Will try nystatin cream inst to keep area clean and dry  Sent for note from Dr Terri Piedra

## 2013-04-08 NOTE — Progress Notes (Signed)
Subjective:    Patient ID: [name], female    DOB: 11-Jul-1923, 79 y.o.   MRN: [id number]  HPI Here for hosp f/u from 8/7 Was hosp for sepsis and klebsiella uti as well as bullous pemphigoid   She was tx with vanc and zosyn Blood cx neg mrsa test neg  Sent home with keflex for infx predinsone for the pemphigoid  She is still on the antibiotic   Had abn swallowing study in hosp Home PT  Has been going to Dr Terri Piedra -- saw him last week - put her on another round of prednisone  Has a lot of redness under left breast  Skin problems are getting worse and worse  He is trying to help her  The prednisone is making her feel crazy and making her sugar high   Skin is peeling on her right knee and there is some drainage (clear) She keeps it covered   Sugar levels are fairly high from the prednisone  She has no appetite and has a hard time eating   Patient Active Problem List   Diagnosis Date Noted  . Pyelonephritis 04/08/2013  . Bullous pemphigoid 04/08/2013  . Acute pyelonephritis 03/10/2013  . Blister 02/04/2013  . Candidal intertrigo 11/12/2012  . Ear discomfort 10/16/2012  . Left facial swelling 10/16/2012  . Renal insufficiency 07/17/2012  . Anemia 06/17/2012  . Metabolic encephalopathy 06/15/2012  . Atrial fibrillation with RVR 06/15/2012  . DM type 2 with diabetic peripheral neuropathy 04/10/2012  . Pain, chronic 12/07/2011  . Frequent falls 12/07/2011  . Benign positional vertigo 10/19/2011  . Dysphagia 10/04/2011  . B12 deficiency 02/18/2011  . Lump in the abdomen 11/24/2010  . UNSPECIFIED VITAMIN D DEFICIENCY 07/12/2010  . OSTEOPOROSIS 06/30/2010  . CHRONIC RHINITIS 01/13/2010  . ADENOMATOUS COLONIC POLYP 05/26/2008  . IRRITABLE BOWEL SYNDROME 09/03/2007  . INTERTRIGO 06/29/2007  . DIABETES MELLITUS, TYPE II 01/12/2007  . HYPERLIPIDEMIA 01/12/2007  . HYPERTENSION 01/12/2007  . Atrial fibrillation 01/12/2007  . GERD 01/12/2007  . HIATAL HERNIA  01/12/2007  . ROSACEA 01/12/2007   Past Medical History  Diagnosis Date  . Atrial fibrillation   . GERD (gastroesophageal reflux disease)   . Hyperlipidemia   . Hypertension   . Hypothyroid   . Obesity   . Vaginal dysplasia 1980  . Rosacea   . Chest pain 11/2002    cardiac cath neg   . Back pain   . IBS (irritable bowel syndrome)     with diarrhea  . Asthma     as a child  . Adenomatous colon polyp 12/2007  . Hemorrhoids   . Vertigo   . Hiatal hernia 12/2007    EGD  . CHF (congestive heart failure)   . Complication of anesthesia     "dr said I should never be put to sleep again" (03/07/2013)  . Exertional shortness of breath   . Type II diabetes mellitus   . Iron deficiency anemia   . Arthritis     "all over my body" (03/07/2013)  . Vaginal cancer    Past Surgical History  Procedure Laterality Date  . Cholecystectomy  2006  . Joint replacement      Rt TKR 1993 & Lt TKR 2001  . Cervical cone biopsy  1963    D&C  . Cystocele repair  1978    /rectocele  . Orif ankle fracture Right 2006  . Total knee arthroplasty Right 1993  . Total knee arthroplasty Left 2001  .  Cataract extraction w/ intraocular lens  implant, bilateral Bilateral 2006  . Ankle fracture surgery Right 2011    fall/followed by rehab stay; "hand to have 9 pins put in it" (03/07/2013)  . Cardiac catheterization  11/2002    [name] 12/11/2002 (03/07/2013)  . Appendectomy  1941    [name] 11/17/1999 (03/07/2013)  . Abdominal hysterectomy  1963    partial/notes 11/17/1999 (03/07/2013   History  Substance Use Topics  . Smoking status: Former Smoker -- 1.00 packs/day for 40 years    Types: Cigarettes  . Smokeless tobacco: Never Used     Comment: 03/07/2013 "quit smoking 35 years ago"  . Alcohol Use: No   Family History  Problem Relation Age of Onset  . Cancer Mother     leukemia  . Coronary artery disease Mother   . Dementia Mother   . Heart disease Father     CAD  . COPD Sister   . Heart disease Brother    . Diabetes Brother   . Kidney disease Son   . Colon cancer Brother    Allergies  Allergen Reactions  . Ciprofloxacin     Nausea Possibly rash and itching- but not sure   . Gabapentin Nausea And Vomiting  . Insulin Glargine     Itching   . Latex     Rash   . Metformin And Related     Severe diarrhea   . Metoclopramide Hcl     REACTION: unspecified  . Nsaids     REACTION: unspecified  . Promethazine Hcl     REACTION: unspecified   Current Outpatient Prescriptions on File Prior to Visit  Medication Sig Dispense Refill  . acetaminophen (TYLENOL) 500 MG tablet Take 500 mg by mouth every 6 (six) hours as needed for pain. For pain      . BD PEN NEEDLE NANO U/F 32G X 4 MM MISC USE AS DIRECTED WITH INSULIN PEN  100 each  1  . cephALEXin (KEFLEX) 500 MG capsule Take 1 capsule (500 mg total) by mouth every 8 (eight) hours.  30 capsule  0  . glucose blood (ONE TOUCH ULTRA TEST) test strip Check sugars 2x a day  200 each  11  . insulin NPH-regular (HUMULIN 70/30) (70-30) 100 UNIT/ML injection Inject under skin 50 units 30 min before breakfast and dinner. Pens please.  30 mL  5  . Insulin Syringe-Needle U-100 (B-D INS SYRINGE 0.5CC/30GX1/2") 30G X 1/2" 0.5 ML MISC Inject 2-3 x a day  100 each  11  . levothyroxine (SYNTHROID, LEVOTHROID) 150 MCG tablet Take 1 tablet (150 mcg total) by mouth daily.  90 tablet  1  . meclizine (ANTIVERT) 25 MG tablet Take 25 mg by mouth 3 (three) times daily as needed. For dizziness      . metoprolol (LOPRESSOR) 50 MG tablet Take 0.5 tablets (25 mg total) by mouth 2 (two) times daily.  90 tablet  1  . omeprazole (PRILOSEC) 20 MG capsule Take 1 capsule (20 mg total) by mouth daily.  90 capsule  1  . potassium chloride SA (K-DUR,KLOR-CON) 20 MEQ tablet Take 1 tablet (20 mEq total) by mouth daily.  90 tablet  1  . PREDNISONE PO Take 4 tablets by mouth daily. Tapered dosing      . silver sulfADIAZINE (SILVADENE) 1 % cream Apply topically daily.  50 g  0  .  simvastatin (ZOCOR) 40 MG tablet Take 1 tablet (40 mg total) by mouth at bedtime.  90 tablet  1  . traMADol (ULTRAM) 50 MG tablet 1-2 pills by mouth up to three times daily as needed for severe pain  540 tablet  3  . warfarin (COUMADIN) 5 MG tablet Take 2.5-5 mg by mouth See admin instructions. Takes 5mg  on Monday & 2.5mg  on all other days       No current facility-administered medications on file prior to visit.     Review of Systems Review of Systems  Constitutional: Negative for fever, appetite change, and unexpected weight change. pos for chronic fatigue and malaise Eyes: Negative for pain and visual disturbance.  Respiratory: Negative for cough and shortness of breath.   Cardiovascular: Negative for cp or palpitations    Gastrointestinal: Negative for nausea, diarrhea and constipation.  Genitourinary: Negative for urgency and frequency. neg for dysuria or hematuria  Skin: Negative for pallor or rash   MSK pos for chronic back pain that restricts mobility Neurological: Negative for weakness, light-headedness, numbness and headaches.  Hematological: Negative for adenopathy. Does not bruise/bleed easily.  Psychiatric/Behavioral: Negative for dysphoric mood. The patient is not nervous/anxious.         Objective:   Physical Exam  Constitutional: She appears well-developed and well-nourished. No distress.  obese and well appearing in wheelchair  HENT:  Head: Normocephalic and atraumatic.  Mouth/Throat: Oropharynx is clear and moist.  Eyes: Conjunctivae and EOM are normal. Pupils are equal, round, and reactive to light. Right eye exhibits no discharge. Left eye exhibits no discharge. No scleral icterus.  Neck: Normal range of motion. Neck supple. No JVD present. Carotid bruit is not present.  Cardiovascular: Normal rate and regular rhythm.   Pulmonary/Chest: Effort normal and breath sounds normal. No respiratory distress. She has no wheezes. She has no rales.  No crackles    Abdominal: Soft. Bowel sounds are normal. She exhibits no distension and no mass. There is no tenderness.  No cva tenderness   Musculoskeletal: She exhibits no edema and no tenderness.  Lymphadenopathy:    She has no cervical adenopathy.  Neurological: She is alert. She has normal reflexes. She exhibits normal muscle tone.  Skin: Skin is warm and dry. Rash noted. There is erythema. No pallor.  Erythematous rash under breasts - bright in color with scalloped edges and satellite lesions  Healing bullae on back  Denuded bullae on R knee that is healing without signs of infection  Psychiatric: She has a normal mood and affect.          Assessment & Plan:

## 2013-04-08 NOTE — Assessment & Plan Note (Signed)
Rev hosp records/ labs and studies in detail - pt had sepsis with MS change tx with IV abx and then keflex which she has finihed  ua pos today- will send for cx- unsure if contaminated  No urinary symtoms at all

## 2013-04-10 ENCOUNTER — Encounter: Payer: Self-pay | Admitting: Neurology

## 2013-04-12 ENCOUNTER — Telehealth: Payer: Self-pay | Admitting: Family Medicine

## 2013-04-12 LAB — URINE CULTURE

## 2013-04-12 MED ORDER — SULFAMETHOXAZOLE-TMP DS 800-160 MG PO TABS: 1.0000 | ORAL_TABLET | Freq: Two times a day (BID) | ORAL | Status: DC

## 2013-04-12 NOTE — Telephone Encounter (Signed)
Pt notified she has UTI and abx sent to pharmacy, coumadin clinic appt scheduled for 04/18/13

## 2013-04-12 NOTE — Telephone Encounter (Signed)
It looks like she has a uti again  Please let her know and call in septra  Let her know all medicines can affect her INR so she needs a coumadin clinic visit in about a week

## 2013-04-16 NOTE — ED Provider Notes (Signed)
Medical screening examination/treatment/procedure(s) were performed by non-physician practitioner and as supervising physician I was immediately available for consultation/collaboration.   Aradhya Shellenbarger B. Bernette Mayers, MD 04/16/13 [phone]

## 2013-04-18 ENCOUNTER — Ambulatory Visit (INDEPENDENT_AMBULATORY_CARE_PROVIDER_SITE_OTHER): Payer: Medicare Other | Admitting: Family Medicine

## 2013-04-18 DIAGNOSIS — Z7901 Long term (current) use of anticoagulants: Secondary | ICD-10-CM

## 2013-04-18 DIAGNOSIS — Z5181 Encounter for therapeutic drug level monitoring: Secondary | ICD-10-CM

## 2013-04-18 DIAGNOSIS — I4891 Unspecified atrial fibrillation: Secondary | ICD-10-CM

## 2013-04-18 LAB — POCT INR: INR: 3.5

## 2013-04-24 ENCOUNTER — Ambulatory Visit (INDEPENDENT_AMBULATORY_CARE_PROVIDER_SITE_OTHER): Payer: Medicare Other | Admitting: Neurology

## 2013-04-24 ENCOUNTER — Encounter: Payer: Self-pay | Admitting: Neurology

## 2013-04-24 VITALS — BP 94/54 | HR 73

## 2013-04-24 DIAGNOSIS — D518 Other vitamin B12 deficiency anemias: Secondary | ICD-10-CM

## 2013-04-24 DIAGNOSIS — R269 Unspecified abnormalities of gait and mobility: Secondary | ICD-10-CM

## 2013-04-24 DIAGNOSIS — Z8679 Personal history of other diseases of the circulatory system: Secondary | ICD-10-CM

## 2013-04-24 HISTORY — DX: Unspecified abnormalities of gait and mobility: R26.9

## 2013-04-24 HISTORY — DX: Personal history of other diseases of the circulatory system: Z86.79

## 2013-04-24 NOTE — Progress Notes (Signed)
Reason for visit: Gait disorder  [name] is an 79 y.o. female  History of present illness:  [name] is an 79 year old right-handed white female with a history of obesity, diabetes, and a gait disorder. The patient was last seen through this office in 2012 with a chronic issue associated with dizziness. The patient has atrial fibrillation, on Coumadin, and evidence of moderate to severe small vessel disease by CT of the brain. The patient has had falls off and on in the past. The patient indicates that she fell twice in July 2014. The patient hit the left side of the head, and she has had some left jaw discomfort, and pain around the left ear, possibly associated with a temporomandibular joint injury. The patient denies numbness of the feet, or burning or stinging of the feet. The patient has recently undergone a CT scan of brain in August of 2014 that once again showed stable relatively severe small vessel disease. The patient continues to have dizziness, but she gained some benefit from meclizine. The patient has had some chronic issues with urinary incontinence. The patient does note some problems with neck discomfort. The patient indicates that she is having some issues with being able to keep her head upright, and her head wants to flex over. The patient uses a walker for ambulation, but she may fall even while using a walker. The patient has recently undergone physical therapy for gait training. The patient returns to this office for an evaluation.  Past Medical History  Diagnosis Date  . Atrial fibrillation   . GERD (gastroesophageal reflux disease)   . Hyperlipidemia   . Hypertension   . Hypothyroid   . Obesity   . Vaginal dysplasia 1980  . Rosacea   . Chest pain 11/2002    cardiac cath neg   . Back pain   . IBS (irritable bowel syndrome)     with diarrhea  . Asthma     as a child  . Adenomatous colon polyp 12/2007  . Hemorrhoids   . Vertigo   . Hiatal hernia  12/2007    EGD  . CHF (congestive heart failure)   . Complication of anesthesia     "dr said I should never be put to sleep again" (03/07/2013)  . Exertional shortness of breath   . Type II diabetes mellitus   . Iron deficiency anemia   . Arthritis     "all over my body" (03/07/2013)  . Vaginal cancer   . History of cerebrovascular disease 04/24/2013  . Abnormality of gait 04/24/2013    Past Surgical History  Procedure Laterality Date  . Cholecystectomy  2006  . Joint replacement      Rt TKR 1993 & Lt TKR 2001  . Cervical cone biopsy  1963    D&C  . Cystocele repair  1978    /rectocele  . Orif ankle fracture Right 2006  . Total knee arthroplasty Right 1993  . Total knee arthroplasty Left 2001  . Cataract extraction w/ intraocular lens  implant, bilateral Bilateral 2006  . Ankle fracture surgery Right 2011    fall/followed by rehab stay; "hand to have 9 pins put in it" (03/07/2013)  . Cardiac catheterization  11/2002    [name] 12/11/2002 (03/07/2013)  . Appendectomy  1941    [name] 11/17/1999 (03/07/2013)  . Abdominal hysterectomy  1963    partial/notes 11/17/1999 (03/07/2013    Family History  Problem Relation Age of Onset  . Cancer Mother  leukemia  . Coronary artery disease Mother   . Dementia Mother   . Heart disease Father     CAD  . COPD Sister   . Heart disease Brother   . Diabetes Brother   . Kidney disease Son   . Colon cancer Brother     Social history:  reports that she has quit smoking. Her smoking use included Cigarettes. She has a 40 pack-year smoking history. She has never used smokeless tobacco. She reports that she does not drink alcohol or use illicit drugs.    Allergies  Allergen Reactions  . Ciprofloxacin     Nausea Possibly rash and itching- but not sure   . Gabapentin Nausea And Vomiting  . Insulin Glargine     Itching   . Latex     Rash   . Metformin And Related     Severe diarrhea   . Metoclopramide Hcl     REACTION: unspecified  .  Nsaids     REACTION: unspecified  . Promethazine Hcl     REACTION: unspecified    Medications:  Current Outpatient Prescriptions on File Prior to Visit  Medication Sig Dispense Refill  . acetaminophen (TYLENOL) 500 MG tablet Take 500 mg by mouth every 6 (six) hours as needed for pain. For pain      . BD PEN NEEDLE NANO U/F 32G X 4 MM MISC USE AS DIRECTED WITH INSULIN PEN  100 each  1  . fluticasone (CUTIVATE) 0.05 % cream Apply 0.05 application topically daily as needed.      Marland Kitchen glucose blood (ONE TOUCH ULTRA TEST) test strip Check sugars 2x a day  200 each  11  . insulin NPH-regular (HUMULIN 70/30) (70-30) 100 UNIT/ML injection Inject under skin 50 units 30 min before breakfast and dinner. Pens please.  30 mL  5  . Insulin Syringe-Needle U-100 (B-D INS SYRINGE 0.5CC/30GX1/2") 30G X 1/2" 0.5 ML MISC Inject 2-3 x a day  100 each  11  . levothyroxine (SYNTHROID, LEVOTHROID) 150 MCG tablet Take 1 tablet (150 mcg total) by mouth daily.  90 tablet  1  . meclizine (ANTIVERT) 25 MG tablet Take 25 mg by mouth 3 (three) times daily as needed. For dizziness      . metoprolol (LOPRESSOR) 50 MG tablet Take 0.5 tablets (25 mg total) by mouth 2 (two) times daily.  90 tablet  1  . nystatin cream (MYCOSTATIN) Apply topically 2 (two) times daily. To the red area under breasts  30 g  1  . omeprazole (PRILOSEC) 20 MG capsule Take 1 capsule (20 mg total) by mouth daily.  90 capsule  1  . potassium chloride SA (K-DUR,KLOR-CON) 20 MEQ tablet Take 1 tablet (20 mEq total) by mouth daily.  90 tablet  1  . silver sulfADIAZINE (SILVADENE) 1 % cream Apply topically daily.  50 g  0  . simvastatin (ZOCOR) 40 MG tablet Take 1 tablet (40 mg total) by mouth at bedtime.  90 tablet  1  . sulfamethoxazole-trimethoprim (BACTRIM DS) 800-160 MG per tablet Take 1 tablet by mouth 2 (two) times daily.  14 tablet  0  . traMADol (ULTRAM) 50 MG tablet 1-2 pills by mouth up to three times daily as needed for severe pain  540 tablet  3  .  triamcinolone cream (KENALOG) 0.1 % Apply 0.1 application topically daily as needed.      . warfarin (COUMADIN) 5 MG tablet Take 2.5-5 mg by mouth See admin instructions. Takes 5mg   on Monday & 2.5mg  on all other days       No current facility-administered medications on file prior to visit.    ROS:  Out of a complete 14 system review of symptoms, the patient complains only of the following symptoms, and all other reviewed systems are negative.  Weight gain, fatigue Swelling in the legs Hearing loss, spending sensations Rash, itching, bullous pemphigoid Blurred vision Shortness of breath, cough, wheezing, snoring Incontinence of bladder Easy bruising, easy bleeding Skin sensitivity Memory loss, confusion, headache, numbness, weakness, difficulty swallowing, vertigo   Blood pressure 94/54, pulse 73, weight 0 lb (0 kg), last menstrual period 09/01/1961.  Physical Exam  General: The patient is alert and cooperative at the time of the examination. The patient is markedly obese.  Skin: 2- 3+ edema below the knees is seen bilaterally.   Neurologic Exam  Cranial nerves: Facial symmetry is present. Speech is normal, no aphasia or dysarthria is noted. Extraocular movements are full. Visual fields are full.  Motor: The patient has good strength in all 4 extremities.  Sensory: The patient has no definite evidence of a stocking pattern pinprick sensory deficit in the legs, but position sense is impaired in both feet.  Coordination: The patient has good finger-nose-finger and heel-to-shin bilaterally.  Gait and station: The patient has a slightly wide-based gait, unsteady. The patient requires some assistance with walking. Tandem gait was not attempted. Romberg is unsteady. No drift is seen.  Reflexes: Deep tendon reflexes are symmetric, but are depressed.   Assessment/Plan:  1. Gait disorder, multifactorial  2. Chronic history dizziness  3. Diabetes  4. Small vessel  disease  The patient likely has a multifactorial gait disorder. On clinical examination, the patient has impaired position sensation in the feet, and has significant degenerative arthritis with bilateral total knee replacements. The patient has moderate to severe small vessel disease by CT of the brain, and a chronic history of dizziness and vertigo. All of these factors may combine to create an issue related to gait imbalance. The patient will be sent for further blood work today, and she will have CT evaluation of the cervical spine given the recent falls. The patient will need to be quite careful with ambulation, always using a walker. The patient is on Coumadin, and if the falls continue, the ambulation activity may need to be curtailed. The patient will followup in 4-6 months.  Marlan Palau MD 04/24/2013 8:41 PM  [facility] [address] [place], [place] [address]  Phone [phone] Fax [phone]

## 2013-04-25 ENCOUNTER — Telehealth: Payer: Self-pay | Admitting: *Deleted

## 2013-04-25 ENCOUNTER — Telehealth: Payer: Self-pay

## 2013-04-25 LAB — VITAMIN B12: Vitamin B-12: 351 pg/mL (ref 211–946)

## 2013-04-25 NOTE — Telephone Encounter (Signed)
I called patient and let her know that lab work was normal. Patient said, "Good" and thanked me.

## 2013-04-25 NOTE — Telephone Encounter (Signed)
Message copied by [facility] on Thu Apr 25, 2013  1:02 PM ------      Message from: Stephanie Acre      Created: Thu Apr 25, 2013 11:38 AM       Please call the patient. The blood work results are unremarkable. Thank you.            ----- Message -----         From: [facility] Lab Results In Interface         Sent: 04/25/2013   9:42 AM           To: York Spaniel, MD                   ------

## 2013-04-25 NOTE — Telephone Encounter (Signed)
Thanks for the update - make sure she goes ahead and schedules that appt

## 2013-04-25 NOTE — Telephone Encounter (Signed)
I advise pt we received dermatologist notes and if she isn't improving that she needs to f/u with derm. Pt just wanted to let you know that once she finished the prednisone Dr. Terri Piedra put her on that her sores came right back and now they are worse, she has them in her ear now. Dr. Terri Piedra is on vacation right now but pt said once he comes back she will call and schedule a f/u with him but just wanted to let Dr. Milinda Antis know what's going on with her

## 2013-04-26 ENCOUNTER — Telehealth: Payer: Self-pay | Admitting: *Deleted

## 2013-04-26 DIAGNOSIS — L538 Other specified erythematous conditions: Secondary | ICD-10-CM

## 2013-04-26 DIAGNOSIS — K625 Hemorrhage of anus and rectum: Secondary | ICD-10-CM | POA: Insufficient documentation

## 2013-04-26 DIAGNOSIS — L120 Bullous pemphigoid: Secondary | ICD-10-CM

## 2013-04-26 NOTE — Telephone Encounter (Signed)
Pt notified of Dr. Royden Purl comments and while I was on the phone with the pt Bonita Quin called her to schedule appt's I transferred call to [facility] to get appts scheduled

## 2013-04-26 NOTE — Telephone Encounter (Signed)
I think she needs to get into Dr Lupton's office and see the first avail provider (do not wait for him if it is too long until he comes back) Have them eval the sores but also the rash I px nystatin for and the vaginal itching if it still occurs  I will do an urgent referral for that I will also ref to Dr Russella Dar

## 2013-04-26 NOTE — Telephone Encounter (Signed)
Also if she feels she may have a uti again- please have someone bring in a urine to re culture-thanks

## 2013-04-26 NOTE — Telephone Encounter (Signed)
Pt called with several issues she wanted me to let Dr. Milinda Antis know what's going on Pt said that the nystatin cream that you prescribed her isn't working Pt said that the sores all over her body are getting worse especially the new sores in her ear, they are really painful (Dr. Terri Piedra still on vacation)  Pt said she is having a lot of rectal bleeding and isn't sure if it's coming from an internal hemorrhoid but it's a lot of blood Pt said it isn't time for a colonoscopy yet but not sure given her sxs if she needs to be referred back to Dr. Russella Dar  Pt said she is still having dysuria, and vaginal itching and burning a lot and none of her current crams she has is working, please advise

## 2013-04-30 ENCOUNTER — Telehealth: Payer: Self-pay | Admitting: Neurology

## 2013-04-30 ENCOUNTER — Ambulatory Visit
Admission: RE | Admit: 2013-04-30 | Discharge: 2013-04-30 | Disposition: A | Discharge status: Home / Self Care | Source: Ambulatory Visit | Attending: Neurology | Admitting: Neurology

## 2013-04-30 DIAGNOSIS — R269 Unspecified abnormalities of gait and mobility: Secondary | ICD-10-CM

## 2013-04-30 DIAGNOSIS — Z8679 Personal history of other diseases of the circulatory system: Secondary | ICD-10-CM

## 2013-04-30 NOTE — Telephone Encounter (Signed)
I called patient. The CT scan of the cervical spine shows multilevel spondylosis, no evidence of spinal stenosis or fracture following a fall. I discussed this with her.

## 2013-05-01 ENCOUNTER — Ambulatory Visit: Payer: Medicare Other | Admitting: Gastroenterology

## 2013-05-06 ENCOUNTER — Ambulatory Visit: Payer: Medicare Other

## 2013-05-06 ENCOUNTER — Ambulatory Visit (INDEPENDENT_AMBULATORY_CARE_PROVIDER_SITE_OTHER): Payer: Medicare Other | Admitting: Family Medicine

## 2013-05-06 DIAGNOSIS — Z5181 Encounter for therapeutic drug level monitoring: Secondary | ICD-10-CM

## 2013-05-06 DIAGNOSIS — Z7901 Long term (current) use of anticoagulants: Secondary | ICD-10-CM

## 2013-05-06 DIAGNOSIS — I4891 Unspecified atrial fibrillation: Secondary | ICD-10-CM

## 2013-05-06 LAB — POCT INR: INR: 3.1

## 2013-05-08 ENCOUNTER — Telehealth: Payer: Self-pay | Admitting: *Deleted

## 2013-05-08 NOTE — Telephone Encounter (Signed)
Pt has been seeing Dr. Terri Piedra for her skin problems, pt has a f/u appt with Dr. Elvera Lennox on 05/15/13 and then a f/u with Dr. Terri Piedra on 10/16. At pt's last OV with Dr. Terri Piedra he gave her orders for labs but she only gets labs done here, pt wanted to know if she brings the orders in with her at her f/u with Dr. Elvera Lennox can you put the orders in [facility] so she can get her blood drawn on 05/15/13 after her appt with Dr. Elvera Lennox so she only has to make one trip

## 2013-05-08 NOTE — Telephone Encounter (Signed)
[name]- are we allowed to do this ? - labs for another doctor

## 2013-05-09 NOTE — Telephone Encounter (Signed)
She has a hard time getting places so it would be helpful to do it here [name]- please have her bring the order to her visit and tell her the diagnosis codes must be on the written order

## 2013-05-09 NOTE — Telephone Encounter (Signed)
Pt notified and lab appt scheduled.

## 2013-05-09 NOTE — Telephone Encounter (Signed)
We can, but this is your call.  If we draw the labs we are then required to make sure the results get to the appropriate doctors.  Some doctors choose to do this here and some don't.  Totally up to you.

## 2013-05-14 ENCOUNTER — Encounter: Payer: Self-pay | Admitting: Radiology

## 2013-05-15 ENCOUNTER — Ambulatory Visit (INDEPENDENT_AMBULATORY_CARE_PROVIDER_SITE_OTHER): Payer: Medicare Other | Admitting: Internal Medicine

## 2013-05-15 ENCOUNTER — Ambulatory Visit: Payer: Medicare Other

## 2013-05-15 ENCOUNTER — Other Ambulatory Visit (INDEPENDENT_AMBULATORY_CARE_PROVIDER_SITE_OTHER): Payer: Medicare Other

## 2013-05-15 ENCOUNTER — Encounter: Payer: Self-pay | Admitting: Internal Medicine

## 2013-05-15 VITALS — BP 118/60 | HR 68 | Temp 97.5°F | Resp 12

## 2013-05-15 DIAGNOSIS — E1142 Type 2 diabetes mellitus with diabetic polyneuropathy: Secondary | ICD-10-CM

## 2013-05-15 DIAGNOSIS — Z79899 Other long term (current) drug therapy: Secondary | ICD-10-CM

## 2013-05-15 DIAGNOSIS — E1149 Type 2 diabetes mellitus with other diabetic neurological complication: Secondary | ICD-10-CM

## 2013-05-15 DIAGNOSIS — L259 Unspecified contact dermatitis, unspecified cause: Secondary | ICD-10-CM

## 2013-05-15 LAB — CBC WITH DIFFERENTIAL/PLATELET
Basophils Absolute: 0 10*3/uL (ref 0.0–0.1)
HCT: 34.5 % — ABNORMAL LOW (ref 36.0–46.0)
Lymphs Abs: 1.5 10*3/uL (ref 0.7–4.0)
MCHC: 33.2 g/dL (ref 30.0–36.0)
MCV: 93.8 fl (ref 78.0–100.0)
Monocytes Absolute: 1 10*3/uL (ref 0.1–1.0)
Neutro Abs: 9.9 10*3/uL — ABNORMAL HIGH (ref 1.4–7.7)
Platelets: 286 10*3/uL (ref 150.0–400.0)
RDW: 15.6 % — ABNORMAL HIGH (ref 11.5–14.6)

## 2013-05-15 LAB — COMPREHENSIVE METABOLIC PANEL
ALT: 15 U/L (ref 0–35)
AST: 16 U/L (ref 0–37)
Alkaline Phosphatase: 50 U/L (ref 39–117)
CO2: 30 mEq/L (ref 19–32)
Sodium: 141 mEq/L (ref 135–145)
Total Bilirubin: 0.6 mg/dL (ref 0.3–1.2)
Total Protein: 7 g/dL (ref 6.0–8.3)

## 2013-05-15 NOTE — Progress Notes (Signed)
Patient ID: [name], female   DOB: [birth year], 79 y.o.   MRN: [id number]  HPI: [name] is a 79 y.o.-year-old female, returning for f/u for DM2, dx 1988, insulin-dependent, uncontrolled, with complications (CKD, peripheral neuropathy). She is here with her husband. Last visit 3 mo ago.  She was recently dx with bullous pemphigoid ~2 mo ago - Dr Terri Piedra. She is on Metotrexate + Prednisone 20 mg daily (restarted 05/06/2013 - will cont. for 30 days), but still has lesions over her body + face.    Last hemoglobin A1c was: Lab Results  Component Value Date   HGBA1C 9.3* 03/07/2013   HGBA1C 11.7* 11/12/2012   HGBA1C 7.9* 06/15/2012   Pt is now on a regimen of: - 70/30 50 units bid - she tells me this is difficult for her to afford at least until the beginning of the year, when she is out of the doughnut hole  Was previously on:  - Levemir 32 units bid - 5 pens = 45$, but will approach the "gap", so she tells me she cannot really afford this - Regular insulin 12 units - takes it sparsely as she says she does not eat much, but usually bid. She developed severe diarrhea from metformin. She still has diarrhea after stopping Metformin, although not as severe.  She did not tolerate Lantus b/c rash.   She could not afford her Levemir.  She believed NovoLog made her itch >> seen by derm and a skin Bx showed drug allergic rxn.    Pt checks her sugars 2 a day and they are improved (brings a log, which reviewed together): - am: 200-400, largest 353 >> now 174-325 >> 116-211 >> 125-217 - before lunch: 200-249 >> 211-280 - before dinner: She was in the 300s before, now 97-212 >> 73-270, had 2 CBGs: 63 and 67. - 2h after dinner: 242-306 - bedtime: 167-344 >> 300 and 435  Lowest sugars 60s x 2 all when she skips lunch; she has hypoglycemia awareness at 70.   - Pt has chronic kidney disease, last BUN/creatinine was:  Lab Results  Component Value Date   BUN 18 03/11/2013   CREATININE 0.95  03/11/2013  Not on an ACEI. - last eye exam was within last year. No DR, reportedly.  - Denies numbness and tingling in her legs. - Latest lipid panel: Lab Results  Component Value Date   CHOL 133 11/12/2012   HDL 32.30* 11/12/2012   LDLCALC 76 11/12/2012   LDLDIRECT 160.8 09/22/2010   TRIG 122.0 11/12/2012   CHOLHDL 4 11/12/2012  She is on a statin.  PMH: She also has a history of hypothyroidism, HL, HTN, A fib (on coumadin), GERD, IBS, hiatal hernia, BPPV, anemia, cataracts - s/p extraction 2006, vit D and B12 def., osteoporosis-s/p ankle fx p fall - with ORIF 2011.  I reviewed pt's medications, allergies, PMH, social hx, family hx and no changes required, except as mentioned above - starting Prednisone and MTX for bullous pemphigoid.  ROS: Constitutional: + weight  gain,+ fatigue Eyes: + occ. blurry vision, no xerophthalmia ENT: no sore throat, no nodules palpated in throat, no dysphagia/no odynophagia, no hoarseness Cardiovascular: no CP/+ SOB/no palpitations/+ legs swelling Respiratory: no cough/+ SOB Gastrointestinal: no N/V/D/C Musculoskeletal: + muscle/+ joint aches and swelling Skin: + rash, + easy bruising, itching Neurological: no tremors/numbness/tingling/dizziness  PE: BP 118/60  Pulse 68  Temp(Src) 97.5 F (36.4 C) (Oral)  Resp 12  SpO2 95%  LMP 09/01/1961 Wt Readings from  Last 3 Encounters:  03/03/11 253 lb (114.76 kg)  03/07/13 240 lb 11.9 oz (109.2 kg)  12/05/12 238 lb (107.956 kg)  Refused being weighed today.  Constitutional: obese, in wheelchair, in NAD Eyes: PERRLA, EOMI, no exophthalmos ENT: moist mucous membranes, no thyromegaly, no cervical lymphadenopathy Cardiovascular: RRR, No MRG Respiratory: CTA B Gastrointestinal: abdomen soft, NT, ND, BS+ Musculoskeletal: no deformities, strength intact in all 4 Skin: moist, warm, + rash on face, arms, chest   ASSESSMENT: 1. DM2, insulin-dependent, uncontrolled, with complications - CKD - peripheral  neuropathy  PLAN:  1. Pt with long standing DM2, uncontrolled. Due to problems with finances, the only insulin that the patient could afford is the Humulin 70/30 - and even this insulin, being in large doses, is costly for her. She is now on 50 units twice a day  and she can drop her sugars in the middle of the day since she eats lunch inconsistently.At  last visit, I sent a prescription for pens to the pharmacy, and she was telling me that she could not draw insulin well in the syringe. The pens are expensive, so we'll need to switch to vials in the future. -  she is now injecting the insulin 30 minutes before breakfast and dinner, but she does not have consistent mealtimes - I advised her to do the following: Patient Instructions  Please let me know if you stop Prednisone as you will need less insulin. Keep the morning insulin dose at 50 units, but increase the evening dose to 56 units.  Please return in 2 months with your sugar log.  - I advised the patient to let me know when she comes off prednisone, in that case we will need to decrease her insulin doses - I will see her back in 2 mo with her sugar log - Will check hemoglobin A1c then

## 2013-05-15 NOTE — Patient Instructions (Signed)
Please let me know if you stop Prednisone as you will need less insulin. Keep the morning insulin dose at 50 units, but increase the evening dose to 56 units.  Please return in 2 months with your sugar log.

## 2013-05-20 ENCOUNTER — Telehealth: Payer: Self-pay | Admitting: *Deleted

## 2013-05-20 NOTE — Telephone Encounter (Signed)
Called pt to ask ?? regarding lab results, pt said she hasn't had any sxs of UTI, but did want you to know Dr. Dorita Sciara office did a wound cx on her knee and it came back positive for bacteria (pt didn't know what type) so pt said Dr. Terri Piedra put her on Cefalexin and she is taking it QID x14 days and he also put her on methotrexate and she is to take 7 tabs today then 6 tab next week then have labs re done (pt is also still on prednisone) , pt has a coumadin appt with Carlena Sax on 06/03/13 and will bring the lab orders from Dr. Dorita Sciara office so she can get her labs done here again

## 2013-05-20 NOTE — Telephone Encounter (Signed)
Thanks for the update - that explains the elevated wbc as well - I hope that she feels better soon

## 2013-05-22 ENCOUNTER — Other Ambulatory Visit: Payer: Self-pay | Admitting: *Deleted

## 2013-05-22 ENCOUNTER — Telehealth: Payer: Self-pay | Admitting: Family Medicine

## 2013-05-22 MED ORDER — "INSULIN SYRINGE-NEEDLE U-100 31G X 15/64"" 1 ML MISC": 1.0000 | Freq: Every morning | Status: DC

## 2013-05-22 NOTE — Telephone Encounter (Signed)
Pt said when saw Dr Elvera Lennox on 05/15/13 discussed changing Humulin 70/30 from pen to vial; pt wanted to wait to decide if wanted to change or not. Pt called back this morning requesting new rx for Humulin 70/30 for vial and syringes sent to [facility] pharmacy.Please advise.

## 2013-05-22 NOTE — Telephone Encounter (Signed)
Pt advise of Dr. Tower's comments and verbalized understanding  

## 2013-05-22 NOTE — Telephone Encounter (Signed)
Pt wants to know if Cephalexin will help with creatinine being elevated. Pt said she is not having any UTI symptoms, no burning or frequency of urine and no blood in urine. Pt has urinary incontinence for many years (not new symptom). Pt request cb to see how can lower creatinine. Pt understands will have creatinine lab recked 06/03/13.

## 2013-05-22 NOTE — Telephone Encounter (Signed)
She needs to drink much more water if possible - her cr goes up when she is dehydrated or has a uti  Also keep sugars in good control  Will continue to watch this and if no improvement she may need to see a kidney specialist Please let us know if any uti symptoms at all

## 2013-05-22 NOTE — Telephone Encounter (Signed)
Carollee Herter, can please you call it in? Syringes: best 31G, 8 mm.

## 2013-05-23 ENCOUNTER — Other Ambulatory Visit: Payer: Self-pay | Admitting: Family Medicine

## 2013-05-23 NOTE — Telephone Encounter (Signed)
Rout coumadin Rx to [facility]

## 2013-05-24 ENCOUNTER — Other Ambulatory Visit: Payer: Self-pay | Admitting: *Deleted

## 2013-05-24 ENCOUNTER — Other Ambulatory Visit: Payer: Self-pay | Admitting: Family Medicine

## 2013-05-24 MED ORDER — INSULIN NPH ISOPHANE & REGULAR (70-30) 100 UNIT/ML ~~LOC~~ SUSP: SUBCUTANEOUS | Status: DC

## 2013-05-24 NOTE — Telephone Encounter (Signed)
Fax refill request, no on current med list, please advise

## 2013-05-24 NOTE — Telephone Encounter (Signed)
Pt called back and on note for 05/22/13 request for Humulin 70/30 need to be changed from pen to vial and [facility] did not receive order to change to vial. Pt request cb when done. Pt said has pens left for 2 days and [facility] not open on Sundays; pt request change to be done today.

## 2013-05-24 NOTE — Telephone Encounter (Signed)
Please call the pt and ask about it --it is in her old med list - ? Is she taking and for what?--thanks

## 2013-05-27 ENCOUNTER — Ambulatory Visit (INDEPENDENT_AMBULATORY_CARE_PROVIDER_SITE_OTHER): Payer: Medicare Other | Admitting: Nurse Practitioner

## 2013-05-27 ENCOUNTER — Encounter: Payer: Self-pay | Admitting: Nurse Practitioner

## 2013-05-27 ENCOUNTER — Telehealth: Payer: Self-pay | Admitting: Gastroenterology

## 2013-05-27 VITALS — BP 138/80 | HR 67 | Ht 62.0 in

## 2013-05-27 DIAGNOSIS — K625 Hemorrhage of anus and rectum: Secondary | ICD-10-CM

## 2013-05-27 DIAGNOSIS — D126 Benign neoplasm of colon, unspecified: Secondary | ICD-10-CM

## 2013-05-27 MED ORDER — HYDROCORTISONE ACETATE 25 MG RE SUPP: 25.0000 mg | Freq: Two times a day (BID) | RECTAL | Status: DC

## 2013-05-27 NOTE — Telephone Encounter (Signed)
Pt said she has always taken lasix for her fluid retention, is it okay to refill, please advise   FYI: pt also wanted you to know she has had a lot of rectal bleeding but she has called her GI and I see the phone note in EPIC, she just wanted to make sure you are aware of it too

## 2013-05-27 NOTE — Telephone Encounter (Signed)
FYI: CMA from [facility] called because pt told them that she can't get her Rx (insulin) because she can't afford medication, I left 1 sample of the Humulin pen for pt to pick up because pt said over weekend her blood sugar was as high as 500, please advise pt on what to do

## 2013-05-27 NOTE — Telephone Encounter (Signed)
See note

## 2013-05-27 NOTE — Telephone Encounter (Signed)
I would rather she not take it right now in the face of her new pemphigoid issues and med- her kidney numbers are already slightly high and I do not want to stress kidneys  Make sure to avoid sodium as much as possible and elevate legs when she can Keep me updated please

## 2013-05-27 NOTE — Telephone Encounter (Signed)
[name],  Did we get any 70/30 insulin samples that she can use? We did not have them when pt was here for last visit.Marland KitchenMarland Kitchen

## 2013-05-27 NOTE — Telephone Encounter (Signed)
Patient with rectal bleeding that started on Saturday.  She reports several episodes of bright red blood.  "I think it's a hemorrhoid".  She is not passing blood independent of stool.  The blood is bright red.  She will come in today and see Willette Cluster RNP at 3:30

## 2013-05-27 NOTE — Patient Instructions (Signed)
We sent a prescription for the hydrocortisone suppositories to your pharmacy, [facility], [place], [place].  Please call us in a week and let us know if this has helped at all. Let us know if you are still having trouble with bleeding.  [facility] [facility] office has samples of the insulin for you.

## 2013-05-27 NOTE — Progress Notes (Signed)
HPI :  Patient is an 79 year old female with multiple medical problems on multiple medications including coumadin. She is known to Dr. Russella Dar for a history of adenomatous colon polyps . She had a multiple colon polyps, including a large cecal polyp removed in June 2009. A couple more adenomatous polyps were removed on follow up colonoscopy November 2009.   Patient comes in today for evaluation of rectal bleeding which started on Saturday two days ago. She had two more episodes of rectal bleeding yesterday but only a small amount of blood in her stool this am. She endorses rectal pain with defecation. No abdominal pain.   Past Medical History  Diagnosis Date  . Atrial fibrillation   . GERD (gastroesophageal reflux disease)   . Hyperlipidemia   . Hypertension   . Hypothyroid   . Obesity   . Vaginal dysplasia 1980  . Rosacea   . Chest pain 11/2002    cardiac cath neg   . Back pain   . IBS (irritable bowel syndrome)     with diarrhea  . Asthma     as a child  . Adenomatous colon polyp 12/2007  . Hemorrhoids   . Vertigo   . Hiatal hernia 12/2007    EGD  . CHF (congestive heart failure)   . Complication of anesthesia     "dr said I should never be put to sleep again" (03/07/2013)  . Exertional shortness of breath   . Type II diabetes mellitus   . Iron deficiency anemia   . Arthritis     "all over my body" (03/07/2013)  . Vaginal cancer   . History of cerebrovascular disease 04/24/2013  . Abnormality of gait 04/24/2013    Family History  Problem Relation Age of Onset  . Cancer Mother     leukemia  . Coronary artery disease Mother   . Dementia Mother   . Heart disease Father     CAD  . COPD Sister   . Heart disease Brother   . Diabetes Brother   . Kidney disease Son   . Colon cancer Brother    History  Substance Use Topics  . Smoking status: Former Smoker -- 1.00 packs/day for 40 years    Types: Cigarettes  . Smokeless tobacco: Never Used     Comment: 03/07/2013 "quit  smoking 35 years ago"  . Alcohol Use: No   Current Outpatient Prescriptions  Medication Sig Dispense Refill  . acetaminophen (TYLENOL) 500 MG tablet Take 500 mg by mouth every 6 (six) hours as needed for pain. For pain      . BD PEN NEEDLE NANO U/F 32G X 4 MM MISC USE AS DIRECTED WITH INSULIN PEN  100 each  1  . fluticasone (CUTIVATE) 0.05 % cream Apply 0.05 application topically daily as needed.      Marland Kitchen glucose blood (ONE TOUCH ULTRA TEST) test strip Check sugars 2x a day  200 each  11  . insulin NPH-regular (HUMULIN 70/30) (70-30) 100 UNIT/ML injection Inject under skin 50 units 30 min before breakfast and dinner. Pens please.  30 mL  5  . Insulin Syringe-Needle U-100 (BD INSULIN SYRINGE ULTRAFINE) 31G X 15/64" 1 ML MISC 1 each by Does not apply route every morning. Use 2 to 3 times daily as directed.  100 each  5  . levothyroxine (SYNTHROID, LEVOTHROID) 150 MCG tablet Take 1 tablet (150 mcg total) by mouth daily.  90 tablet  1  . lidocaine (XYLOCAINE) 5 %  ointment       . meclizine (ANTIVERT) 25 MG tablet Take 25 mg by mouth 3 (three) times daily as needed. For dizziness      . methotrexate (RHEUMATREX) 2.5 MG tablet       . metoprolol (LOPRESSOR) 50 MG tablet Take 0.5 tablets (25 mg total) by mouth 2 (two) times daily.  90 tablet  1  . nystatin cream (MYCOSTATIN) Apply topically 2 (two) times daily. To the red area under breasts  30 g  1  . omeprazole (PRILOSEC) 20 MG capsule Take 1 capsule (20 mg total) by mouth daily.  90 capsule  1  . potassium chloride SA (K-DUR,KLOR-CON) 20 MEQ tablet Take 1 tablet by mouth  daily  90 tablet  1  . predniSONE (DELTASONE) 20 MG tablet       . silver sulfADIAZINE (SILVADENE) 1 % cream Apply topically daily.  50 g  0  . simvastatin (ZOCOR) 40 MG tablet Take 1 tablet (40 mg total) by mouth at bedtime.  90 tablet  1  . sulfamethoxazole-trimethoprim (BACTRIM DS) 800-160 MG per tablet Take 1 tablet by mouth 2 (two) times daily.  14 tablet  0  . traMADol  (ULTRAM) 50 MG tablet 1-2 pills by mouth up to three times daily as needed for severe pain  540 tablet  3  . triamcinolone cream (KENALOG) 0.1 % Apply 0.1 application topically daily as needed.      . warfarin (COUMADIN) 5 MG tablet Take 2.5-5 mg by mouth See admin instructions. Takes 5mg  on Monday & 2.5mg  on all other days      . warfarin (COUMADIN) 5 MG tablet Take as directed by  [facility].  30 tablet  2   No current facility-administered medications for this visit.   Allergies  Allergen Reactions  . Ciprofloxacin     Nausea Possibly rash and itching- but not sure   . Gabapentin Nausea And Vomiting  . Insulin Glargine     Itching   . Latex     Rash   . Metformin And Related     Severe diarrhea   . Metoclopramide Hcl     REACTION: unspecified  . Nsaids     REACTION: unspecified  . Promethazine Hcl     REACTION: unspecified     Review of Systems: Positive for arthritis, back pain, fatigue, fever, hearing problems, itching, SOB, skin rash, swelling of feet/legs, excessive urination, urine leakage. All other systems reviewed and negative except where noted in HPI.   Physical Exam: BP 138/80  Pulse 67  Ht 5\' 2"  (1.575 m)  LMP 09/01/1961 Constitutional: Pleasant, obese female in a wheelchair in no acute distress. HEENT: Normocephalic and atraumatic. Conjunctivae are normal. No scleral icterus. Neck supple.  Cardiovascular: Normal rate, regular rhythm.  Pulmonary/chest: Effort normal and breath sounds normal. No wheezing, rales or rhonchi. Abdominal: Unable to adequately exam. Patient to debilitated to get on exam table. Bowel sounds active throughout.  Rectal: suboptimal exam. Patient couldn't get on exam table. Exam done with patient in standing position. Large external hemorrhoids with bright red blood on them. DRE suboptimal but bright red blood (scant amount) found in anal canal. Neurological: Alert and oriented to person place and time. Skin: Skin is warm and  dry. No rashes noted. Multiple scabs over body. Several bandages on legs / abdomen Psychiatric: Normal mood and affect. Behavior is normal.   ASSESSMENT AND PLAN: 40. 79 year old female with rectal bleeding. Bleeding likely secondary to  hemorrhoids seen on exam. Fissure not seen but cannot exclude the possibility.Patient has a history of multiple adenomatous colon polyps in 2009 so bleeding polyp or neoplasm not excluded. Doubt diverticular hemorrhage. Patient not good candidate for repeat colonoscopy given advanced age, multiple co-morbidities and physical deconditioning. Will treat hemorrhoids with steroid suppositories. Patient will call us in a week or so with condition update.   2. History of adenomatous colon polyps 2009. Again, patient not good candidate for repeat colonoscopy given advanced age, multiple co-morbidities and physical deconditioning.  3. Chronic anticoagulation, on coumadin. INR 05/06/13 was 3.1.

## 2013-05-28 NOTE — Telephone Encounter (Signed)
Tried to call pt and was unable to reach her, unable to lvm. Will bring Lantus and Novolog to [place] office for pt/pt's husband to pick up. Will try to call pt again in the morning to pick up, with instructions.

## 2013-05-28 NOTE — Telephone Encounter (Signed)
[name], if we cannot get any 70/30 insulin, we should have the patient back as soon as possible and give her Lantus and Humalog or NovoLog until we get any 70/30. A possible regimen is: - Lantus 35 units 2x a day - Humalog or NovoLog 20 units with breakfast and dinner

## 2013-05-28 NOTE — Telephone Encounter (Signed)
Pt notified of Dr. Royden Purl comments about stopping the lasix and voice understanding, Rx declined

## 2013-05-28 NOTE — Progress Notes (Signed)
Reviewed and agree with management plan.  Malcolm T. Stark, MD FACG 

## 2013-05-29 ENCOUNTER — Telehealth: Payer: Self-pay | Admitting: Nurse Practitioner

## 2013-05-29 NOTE — Telephone Encounter (Signed)
Unable to reach patient.

## 2013-05-29 NOTE — Telephone Encounter (Signed)
Unable to reach patient will try again later 

## 2013-05-29 NOTE — Telephone Encounter (Signed)
Spoke with pt and she is ok. No need for samples. Pt stated the last pen she had did not work and was able to get her refill and the pharmacist is replacing the defective pen. Pt is ok with medication for now. Be advised.

## 2013-05-30 NOTE — Telephone Encounter (Signed)
She can use another hemorrhoidal HC supp or HC cream if there is a less expensive alternative but she needs to treat her hemorrhoids. They will likely have recurrent symptoms.

## 2013-05-30 NOTE — Telephone Encounter (Signed)
Spoke with patient and the suppositories were $124 and she could not afford this. She states she is not bleeding at this time. Please, advise.

## 2013-05-31 ENCOUNTER — Encounter: Payer: Self-pay | Admitting: Family Medicine

## 2013-05-31 MED ORDER — HYDROCORTISONE ACE-PRAMOXINE 1-1 % RE FOAM: 1.0000 | Freq: Two times a day (BID) | RECTAL | Status: DC

## 2013-05-31 NOTE — Telephone Encounter (Signed)
Proctofoam Rx sent. Patient notified.

## 2013-06-03 ENCOUNTER — Other Ambulatory Visit (INDEPENDENT_AMBULATORY_CARE_PROVIDER_SITE_OTHER): Payer: Medicare Other

## 2013-06-03 ENCOUNTER — Ambulatory Visit (INDEPENDENT_AMBULATORY_CARE_PROVIDER_SITE_OTHER): Payer: Medicare Other | Admitting: Family Medicine

## 2013-06-03 DIAGNOSIS — L129 Pemphigoid, unspecified: Secondary | ICD-10-CM

## 2013-06-03 DIAGNOSIS — Z5181 Encounter for therapeutic drug level monitoring: Secondary | ICD-10-CM

## 2013-06-03 DIAGNOSIS — Z7901 Long term (current) use of anticoagulants: Secondary | ICD-10-CM

## 2013-06-03 DIAGNOSIS — I4891 Unspecified atrial fibrillation: Secondary | ICD-10-CM

## 2013-06-03 DIAGNOSIS — Z79899 Other long term (current) drug therapy: Secondary | ICD-10-CM

## 2013-06-03 LAB — LIPID PANEL
HDL: 39.6 mg/dL (ref >39.00)
LDL Cholesterol: 43 mg/dL (ref 0–99)
Total CHOL/HDL Ratio: 3
Triglycerides: 157 mg/dL — ABNORMAL HIGH (ref 0.0–149.0)
VLDL: 31.4 mg/dL (ref 0.0–40.0)

## 2013-06-03 LAB — CBC WITH DIFFERENTIAL/PLATELET
Basophils Absolute: 0 10*3/uL (ref 0.0–0.1)
Eosinophils Absolute: 0.2 10*3/uL (ref 0.0–0.7)
HCT: 32.1 % — ABNORMAL LOW (ref 36.0–46.0)
Hemoglobin: 10.5 g/dL — ABNORMAL LOW (ref 12.0–15.0)
Lymphs Abs: 1.7 10*3/uL (ref 0.7–4.0)
MCHC: 32.8 g/dL (ref 30.0–36.0)
MCV: 94.6 fl (ref 78.0–100.0)
Monocytes Absolute: 0.6 10*3/uL (ref 0.1–1.0)
Neutro Abs: 4.7 10*3/uL (ref 1.4–7.7)
Platelets: 249 10*3/uL (ref 150.0–400.0)
RDW: 16 % — ABNORMAL HIGH (ref 11.5–14.6)

## 2013-06-03 LAB — COMPREHENSIVE METABOLIC PANEL
ALT: 16 U/L (ref 0–35)
AST: 18 U/L (ref 0–37)
Alkaline Phosphatase: 49 U/L (ref 39–117)
Creatinine, Ser: 1.2 mg/dL (ref 0.4–1.2)
Total Bilirubin: 0.8 mg/dL (ref 0.3–1.2)

## 2013-06-03 LAB — POCT INR: INR: 5.6

## 2013-06-06 ENCOUNTER — Ambulatory Visit (INDEPENDENT_AMBULATORY_CARE_PROVIDER_SITE_OTHER): Payer: Medicare Other | Admitting: Family Medicine

## 2013-06-06 ENCOUNTER — Ambulatory Visit: Payer: Self-pay | Admitting: Podiatry

## 2013-06-06 ENCOUNTER — Other Ambulatory Visit: Payer: Self-pay

## 2013-06-06 DIAGNOSIS — Z5181 Encounter for therapeutic drug level monitoring: Secondary | ICD-10-CM

## 2013-06-06 DIAGNOSIS — Z7901 Long term (current) use of anticoagulants: Secondary | ICD-10-CM

## 2013-06-06 DIAGNOSIS — I4891 Unspecified atrial fibrillation: Secondary | ICD-10-CM

## 2013-06-06 LAB — POCT INR: INR: 2.5

## 2013-06-07 ENCOUNTER — Telehealth: Payer: Self-pay | Admitting: Family Medicine

## 2013-06-07 NOTE — Telephone Encounter (Signed)
Pt left voicemail asking if you received her labs from Monday and if so she wanted to know how her Creatinine was, please advise

## 2013-06-07 NOTE — Telephone Encounter (Signed)
Lab Results  Component Value Date   CREATININE 1.2 06/03/2013   much improved- that is reassuring

## 2013-06-07 NOTE — Telephone Encounter (Signed)
Pt.notified

## 2013-06-18 ENCOUNTER — Telehealth: Payer: Self-pay | Admitting: Internal Medicine

## 2013-06-19 ENCOUNTER — Telehealth: Payer: Self-pay | Admitting: *Deleted

## 2013-06-19 NOTE — Telephone Encounter (Signed)
Decrease to the 70/30 at night to 42 units.

## 2013-06-19 NOTE — Telephone Encounter (Signed)
Returned pt's call. She states she is having low bg at night. For the past 2 to 3 nights it has been in the 70's. She states she is eating a snack before bed. She is still in a lot of knee pain. She is not on the prednisone at this time. Pt checked her bg this am and it was 95. Please advise.

## 2013-06-19 NOTE — Telephone Encounter (Signed)
Called pt and advised her to decrease her 70/30 to 50 units (from 56 units), pt stated she has been doing 50 units at night. Please advise.

## 2013-06-19 NOTE — Telephone Encounter (Signed)
Please tell her to decrease the pm dose of 70/30 to 50 units (from 56). Call back with sugars in few days.

## 2013-06-20 NOTE — Telephone Encounter (Signed)
Called pt and advised her to decrease to the 70/30 at night to 42 units. Pt understood.

## 2013-07-04 ENCOUNTER — Telehealth: Payer: Self-pay | Admitting: *Deleted

## 2013-07-04 ENCOUNTER — Ambulatory Visit (INDEPENDENT_AMBULATORY_CARE_PROVIDER_SITE_OTHER): Payer: Medicare Other | Admitting: Family Medicine

## 2013-07-04 DIAGNOSIS — Z5181 Encounter for therapeutic drug level monitoring: Secondary | ICD-10-CM

## 2013-07-04 DIAGNOSIS — Z7901 Long term (current) use of anticoagulants: Secondary | ICD-10-CM

## 2013-07-04 DIAGNOSIS — I4891 Unspecified atrial fibrillation: Secondary | ICD-10-CM

## 2013-07-04 NOTE — Telephone Encounter (Signed)
Pt came in for a coumadin appt. and wanted me to let you know that since stopping her lasix that both her legs are swelling bad to the point it's hard to put shoes on because they are so swollen, pt wanted me to ask you what to do, please advise

## 2013-07-04 NOTE — Telephone Encounter (Signed)
Let's start back with lasix 40 mg - take 1/2 pill daily and if that is not enough, take 1 daily  This will make her urinate more- she is aware Make sure she is taking her K with this If she does not have any lasix send in 40 mg 1 po qd 30 with 5 ref   Check bmp please in about a mo

## 2013-07-05 MED ORDER — FUROSEMIDE 40 MG PO TABS: ORAL_TABLET | ORAL | Status: DC

## 2013-07-05 NOTE — Telephone Encounter (Signed)
Pt notified of Dr. Royden Purl advise, new Rx sent to pharmacy and lab appt scheduled

## 2013-07-08 ENCOUNTER — Other Ambulatory Visit (INDEPENDENT_AMBULATORY_CARE_PROVIDER_SITE_OTHER): Payer: Medicare Other

## 2013-07-08 DIAGNOSIS — Z79899 Other long term (current) drug therapy: Secondary | ICD-10-CM

## 2013-07-08 LAB — CBC WITH DIFFERENTIAL/PLATELET
Basophils Relative: 0.3 % (ref 0.0–3.0)
Eosinophils Relative: 1.6 % (ref 0.0–5.0)
Hemoglobin: 10.8 g/dL — ABNORMAL LOW (ref 12.0–15.0)
Lymphocytes Relative: 21.1 % (ref 12.0–46.0)
Lymphs Abs: 1.7 10*3/uL (ref 0.7–4.0)
MCV: 95.3 fl (ref 78.0–100.0)
Monocytes Absolute: 1 10*3/uL (ref 0.1–1.0)
Monocytes Relative: 12.6 % — ABNORMAL HIGH (ref 3.0–12.0)
Neutrophils Relative %: 64.4 % (ref 43.0–77.0)
Platelets: 343 10*3/uL (ref 150.0–400.0)
RBC: 3.49 Mil/uL — ABNORMAL LOW (ref 3.87–5.11)
RDW: 17.8 % — ABNORMAL HIGH (ref 11.5–14.6)
WBC: 7.9 10*3/uL (ref 4.5–10.5)

## 2013-07-08 LAB — COMPREHENSIVE METABOLIC PANEL
Albumin: 3.3 g/dL — ABNORMAL LOW (ref 3.5–5.2)
Alkaline Phosphatase: 82 U/L (ref 39–117)
BUN: 20 mg/dL (ref 6–23)
CO2: 23 mEq/L (ref 19–32)
GFR: 38.96 mL/min — ABNORMAL LOW (ref >60.00)
Glucose, Bld: 288 mg/dL — ABNORMAL HIGH (ref 70–99)
Potassium: 4.8 mEq/L (ref 3.5–5.1)
Total Bilirubin: 0.7 mg/dL (ref 0.3–1.2)
Total Protein: 7.2 g/dL (ref 6.0–8.3)

## 2013-07-15 ENCOUNTER — Ambulatory Visit: Payer: Self-pay | Admitting: Podiatry

## 2013-07-23 ENCOUNTER — Inpatient Hospital Stay (HOSPITAL_COMMUNITY)
Admission: EM | Admit: 2013-07-23 | Discharge: 2013-07-28 | DRG: 690 | Disposition: A | Discharge status: Home / Self Care | Payer: Medicare Other | Attending: Internal Medicine | Admitting: Internal Medicine

## 2013-07-23 ENCOUNTER — Encounter (HOSPITAL_COMMUNITY): Payer: Self-pay | Admitting: Emergency Medicine

## 2013-07-23 DIAGNOSIS — Z8673 Personal history of transient ischemic attack (TIA), and cerebral infarction without residual deficits: Secondary | ICD-10-CM

## 2013-07-23 DIAGNOSIS — Z8249 Family history of ischemic heart disease and other diseases of the circulatory system: Secondary | ICD-10-CM

## 2013-07-23 DIAGNOSIS — G9341 Metabolic encephalopathy: Secondary | ICD-10-CM

## 2013-07-23 DIAGNOSIS — E119 Type 2 diabetes mellitus without complications: Secondary | ICD-10-CM | POA: Diagnosis present

## 2013-07-23 DIAGNOSIS — R269 Unspecified abnormalities of gait and mobility: Secondary | ICD-10-CM

## 2013-07-23 DIAGNOSIS — I10 Essential (primary) hypertension: Secondary | ICD-10-CM | POA: Diagnosis present

## 2013-07-23 DIAGNOSIS — H8110 Benign paroxysmal vertigo, unspecified ear: Secondary | ICD-10-CM

## 2013-07-23 DIAGNOSIS — Z87440 Personal history of urinary (tract) infections: Secondary | ICD-10-CM

## 2013-07-23 DIAGNOSIS — N179 Acute kidney failure, unspecified: Secondary | ICD-10-CM | POA: Diagnosis present

## 2013-07-23 DIAGNOSIS — N289 Disorder of kidney and ureter, unspecified: Secondary | ICD-10-CM

## 2013-07-23 DIAGNOSIS — N10 Acute tubulo-interstitial nephritis: Secondary | ICD-10-CM

## 2013-07-23 DIAGNOSIS — K449 Diaphragmatic hernia without obstruction or gangrene: Secondary | ICD-10-CM

## 2013-07-23 DIAGNOSIS — D72829 Elevated white blood cell count, unspecified: Secondary | ICD-10-CM | POA: Diagnosis present

## 2013-07-23 DIAGNOSIS — Z806 Family history of leukemia: Secondary | ICD-10-CM

## 2013-07-23 DIAGNOSIS — N12 Tubulo-interstitial nephritis, not specified as acute or chronic: Secondary | ICD-10-CM

## 2013-07-23 DIAGNOSIS — L719 Rosacea, unspecified: Secondary | ICD-10-CM

## 2013-07-23 DIAGNOSIS — R1900 Intra-abdominal and pelvic swelling, mass and lump, unspecified site: Secondary | ICD-10-CM

## 2013-07-23 DIAGNOSIS — Z79899 Other long term (current) drug therapy: Secondary | ICD-10-CM

## 2013-07-23 DIAGNOSIS — N390 Urinary tract infection, site not specified: Principal | ICD-10-CM | POA: Diagnosis present

## 2013-07-23 DIAGNOSIS — Z96659 Presence of unspecified artificial knee joint: Secondary | ICD-10-CM

## 2013-07-23 DIAGNOSIS — E785 Hyperlipidemia, unspecified: Secondary | ICD-10-CM | POA: Diagnosis present

## 2013-07-23 DIAGNOSIS — K625 Hemorrhage of anus and rectum: Secondary | ICD-10-CM

## 2013-07-23 DIAGNOSIS — M810 Age-related osteoporosis without current pathological fracture: Secondary | ICD-10-CM

## 2013-07-23 DIAGNOSIS — H9202 Otalgia, left ear: Secondary | ICD-10-CM

## 2013-07-23 DIAGNOSIS — G8929 Other chronic pain: Secondary | ICD-10-CM

## 2013-07-23 DIAGNOSIS — R4182 Altered mental status, unspecified: Secondary | ICD-10-CM | POA: Diagnosis present

## 2013-07-23 DIAGNOSIS — L538 Other specified erythematous conditions: Secondary | ICD-10-CM

## 2013-07-23 DIAGNOSIS — B372 Candidiasis of skin and nail: Secondary | ICD-10-CM

## 2013-07-23 DIAGNOSIS — Z860100 Personal history of colon polyps, unspecified: Secondary | ICD-10-CM

## 2013-07-23 DIAGNOSIS — Z8679 Personal history of other diseases of the circulatory system: Secondary | ICD-10-CM

## 2013-07-23 DIAGNOSIS — J310 Chronic rhinitis: Secondary | ICD-10-CM

## 2013-07-23 DIAGNOSIS — L120 Bullous pemphigoid: Secondary | ICD-10-CM

## 2013-07-23 DIAGNOSIS — B9689 Other specified bacterial agents as the cause of diseases classified elsewhere: Secondary | ICD-10-CM | POA: Diagnosis present

## 2013-07-23 DIAGNOSIS — E1142 Type 2 diabetes mellitus with diabetic polyneuropathy: Secondary | ICD-10-CM

## 2013-07-23 DIAGNOSIS — Z87891 Personal history of nicotine dependence: Secondary | ICD-10-CM

## 2013-07-23 DIAGNOSIS — Z833 Family history of diabetes mellitus: Secondary | ICD-10-CM

## 2013-07-23 DIAGNOSIS — E559 Vitamin D deficiency, unspecified: Secondary | ICD-10-CM

## 2013-07-23 DIAGNOSIS — E876 Hypokalemia: Secondary | ICD-10-CM | POA: Diagnosis present

## 2013-07-23 DIAGNOSIS — K589 Irritable bowel syndrome without diarrhea: Secondary | ICD-10-CM

## 2013-07-23 DIAGNOSIS — Z7901 Long term (current) use of anticoagulants: Secondary | ICD-10-CM

## 2013-07-23 DIAGNOSIS — D649 Anemia, unspecified: Secondary | ICD-10-CM

## 2013-07-23 DIAGNOSIS — D126 Benign neoplasm of colon, unspecified: Secondary | ICD-10-CM

## 2013-07-23 DIAGNOSIS — R296 Repeated falls: Secondary | ICD-10-CM

## 2013-07-23 DIAGNOSIS — R220 Localized swelling, mass and lump, head: Secondary | ICD-10-CM

## 2013-07-23 DIAGNOSIS — Z800 Family history of malignant neoplasm of digestive organs: Secondary | ICD-10-CM

## 2013-07-23 DIAGNOSIS — I4891 Unspecified atrial fibrillation: Secondary | ICD-10-CM | POA: Diagnosis present

## 2013-07-23 DIAGNOSIS — T148XXA Other injury of unspecified body region, initial encounter: Secondary | ICD-10-CM

## 2013-07-23 DIAGNOSIS — E039 Hypothyroidism, unspecified: Secondary | ICD-10-CM | POA: Diagnosis present

## 2013-07-23 DIAGNOSIS — K219 Gastro-esophageal reflux disease without esophagitis: Secondary | ICD-10-CM

## 2013-07-23 DIAGNOSIS — Z794 Long term (current) use of insulin: Secondary | ICD-10-CM

## 2013-07-23 DIAGNOSIS — E538 Deficiency of other specified B group vitamins: Secondary | ICD-10-CM

## 2013-07-23 LAB — CBC WITH DIFFERENTIAL/PLATELET
Basophils Relative: 1 % (ref 0–1)
Eosinophils Absolute: 0.1 10*3/uL (ref 0.0–0.7)
HCT: 35.4 % — ABNORMAL LOW (ref 36.0–46.0)
Hemoglobin: 11.5 g/dL — ABNORMAL LOW (ref 12.0–15.0)
Lymphocytes Relative: 14 % (ref 12–46)
Lymphs Abs: 1.8 10*3/uL (ref 0.7–4.0)
MCH: 30.8 pg (ref 26.0–34.0)
MCV: 94.9 fL (ref 78.0–100.0)
Monocytes Absolute: 0.9 10*3/uL (ref 0.1–1.0)
Monocytes Relative: 7 % (ref 3–12)
Neutro Abs: 10.1 10*3/uL — ABNORMAL HIGH (ref 1.7–7.7)
Neutrophils Relative %: 78 % — ABNORMAL HIGH (ref 43–77)
Platelets: 319 10*3/uL (ref 150–400)
RBC: 3.73 MIL/uL — ABNORMAL LOW (ref 3.87–5.11)
WBC: 13.1 10*3/uL — ABNORMAL HIGH (ref 4.0–10.5)

## 2013-07-23 LAB — URINALYSIS, ROUTINE W REFLEX MICROSCOPIC
Glucose, UA: NEGATIVE mg/dL
Ketones, ur: NEGATIVE mg/dL
Protein, ur: NEGATIVE mg/dL
pH: 5.5 (ref 5.0–8.0)

## 2013-07-23 LAB — COMPREHENSIVE METABOLIC PANEL
ALT: 56 U/L — ABNORMAL HIGH (ref 0–35)
Albumin: 3.4 g/dL — ABNORMAL LOW (ref 3.5–5.2)
Alkaline Phosphatase: 125 U/L — ABNORMAL HIGH (ref 39–117)
BUN: 17 mg/dL (ref 6–23)
Chloride: 104 mEq/L (ref 96–112)
GFR calc Af Amer: 55 mL/min — ABNORMAL LOW (ref >90)
Glucose, Bld: 63 mg/dL — ABNORMAL LOW (ref 70–99)
Potassium: 3.6 mEq/L (ref 3.5–5.1)
Sodium: 140 mEq/L (ref 135–145)
Total Bilirubin: 2.7 mg/dL — ABNORMAL HIGH (ref 0.3–1.2)
Total Protein: 7.7 g/dL (ref 6.0–8.3)

## 2013-07-23 LAB — URINE MICROSCOPIC-ADD ON

## 2013-07-23 MED ORDER — FENTANYL CITRATE 0.05 MG/ML IJ SOLN
100.0000 ug | Freq: Once | INTRAMUSCULAR | Status: AC
  Administered 2013-07-23: 100 ug via INTRAVENOUS
  Filled 2013-07-23: qty 2

## 2013-07-23 MED ORDER — DEXTROSE-NACL 5-0.45 % IV SOLN
INTRAVENOUS | Status: DC
  Administered 2013-07-23 – 2013-07-24 (×2): via INTRAVENOUS

## 2013-07-23 MED ORDER — METOPROLOL TARTRATE 25 MG PO TABS
25.0000 mg | ORAL_TABLET | Freq: Two times a day (BID) | ORAL | Status: DC
  Administered 2013-07-24 – 2013-07-28 (×10): 25 mg via ORAL
  Filled 2013-07-23 (×11): qty 1

## 2013-07-23 MED ORDER — ONDANSETRON HCL 4 MG/2ML IJ SOLN
4.0000 mg | Freq: Once | INTRAMUSCULAR | Status: AC
  Administered 2013-07-23: 4 mg via INTRAVENOUS
  Filled 2013-07-23: qty 2

## 2013-07-23 MED ORDER — INSULIN ASPART 100 UNIT/ML ~~LOC~~ SOLN
0.0000 [IU] | SUBCUTANEOUS | Status: DC
  Administered 2013-07-24: 2 [IU] via SUBCUTANEOUS

## 2013-07-23 MED ORDER — FUROSEMIDE 40 MG PO TABS
40.0000 mg | ORAL_TABLET | Freq: Every day | ORAL | Status: DC
  Administered 2013-07-24 – 2013-07-25 (×2): 40 mg via ORAL
  Filled 2013-07-23 (×2): qty 1

## 2013-07-23 MED ORDER — PANTOPRAZOLE SODIUM 40 MG PO TBEC
40.0000 mg | DELAYED_RELEASE_TABLET | Freq: Every day | ORAL | Status: DC
  Administered 2013-07-24 – 2013-07-28 (×5): 40 mg via ORAL
  Filled 2013-07-23 (×5): qty 1

## 2013-07-23 MED ORDER — SIMVASTATIN 40 MG PO TABS
40.0000 mg | ORAL_TABLET | Freq: Every day | ORAL | Status: DC
  Administered 2013-07-24 – 2013-07-27 (×5): 40 mg via ORAL
  Filled 2013-07-23 (×6): qty 1

## 2013-07-23 MED ORDER — HYDROCORTISONE ACE-PRAMOXINE 1-1 % RE FOAM
1.0000 | Freq: Two times a day (BID) | RECTAL | Status: DC
  Administered 2013-07-24 – 2013-07-27 (×8): 1 via RECTAL
  Filled 2013-07-23 (×2): qty 10

## 2013-07-23 MED ORDER — POTASSIUM CHLORIDE CRYS ER 20 MEQ PO TBCR
20.0000 meq | EXTENDED_RELEASE_TABLET | Freq: Every day | ORAL | Status: DC
  Administered 2013-07-24 – 2013-07-28 (×5): 20 meq via ORAL
  Filled 2013-07-23 (×5): qty 1

## 2013-07-23 MED ORDER — DEXTROSE 5 % IV SOLN
1.0000 g | INTRAVENOUS | Status: DC
  Administered 2013-07-24 – 2013-07-27 (×5): 1 g via INTRAVENOUS
  Filled 2013-07-23 (×5): qty 10

## 2013-07-23 MED ORDER — DEXTROSE 5 % IV SOLN
160.0000 mg | Freq: Once | INTRAVENOUS | Status: DC
  Filled 2013-07-23: qty 10

## 2013-07-23 MED ORDER — HYDROCORTISONE ACETATE 25 MG RE SUPP
25.0000 mg | Freq: Two times a day (BID) | RECTAL | Status: DC
  Administered 2013-07-24 – 2013-07-28 (×10): 25 mg via RECTAL
  Filled 2013-07-23 (×12): qty 1

## 2013-07-23 MED ORDER — LEVOTHYROXINE SODIUM 150 MCG PO TABS
150.0000 ug | ORAL_TABLET | Freq: Every day | ORAL | Status: DC
  Administered 2013-07-24 – 2013-07-28 (×5): 150 ug via ORAL
  Filled 2013-07-23 (×6): qty 1

## 2013-07-23 MED ORDER — DEXTROSE 5 % IV SOLN: 1.0000 g | Freq: Once | INTRAVENOUS | Status: DC

## 2013-07-23 MED ORDER — ACETAMINOPHEN 500 MG PO TABS
500.0000 mg | ORAL_TABLET | Freq: Four times a day (QID) | ORAL | Status: DC | PRN
  Administered 2013-07-24: 500 mg via ORAL
  Filled 2013-07-23: qty 1

## 2013-07-23 NOTE — ED Notes (Signed)
Bed: WA25 Expected date: 07/23/13 Expected time: 7:33 PM Means of arrival: Ambulance Comments: 79 yo F  Confused, lethargic, poss UTI

## 2013-07-23 NOTE — H&P (Signed)
[facility] History and Physical  [name]:[id number] DOB: December 30, 1922 DOA: 07/23/2013  Referring physician: EDP PCP: [name], MD   Chief Complaint: AMS    HPI: [name] is a 79 y.o. female who presents to the ED with AMS.  She has been getting more lethargic and not getting out of bed over the past few days.  She is disoriented and states that it is July 4th 2003.  Per family this is not her baseline.  Per family she also has an extensive history of UTIs, delerium with UTIs, and they are suspicious that she may have another one.  Review of Systems: Unable to perform due to AMS.  Past Medical History  Diagnosis Date  . Atrial fibrillation   . GERD (gastroesophageal reflux disease)   . Hyperlipidemia   . Hypertension   . Hypothyroid   . Obesity   . Vaginal dysplasia 1980  . Rosacea   . Chest pain 11/2002    cardiac cath neg   . Back pain   . IBS (irritable bowel syndrome)     with diarrhea  . Asthma     as a child  . Adenomatous colon polyp 12/2007  . Hemorrhoids   . Vertigo   . Hiatal hernia 12/2007    EGD  . CHF (congestive heart failure)   . Complication of anesthesia     "dr said I should never be put to sleep again" (03/07/2013)  . Exertional shortness of breath   . Type II diabetes mellitus   . Iron deficiency anemia   . Arthritis     "all over my body" (03/07/2013)  . Vaginal cancer   . History of cerebrovascular disease 04/24/2013  . Abnormality of gait 04/24/2013   Past Surgical History  Procedure Laterality Date  . Cholecystectomy  2006  . Joint replacement      Rt TKR 1993 & Lt TKR 2001  . Cervical cone biopsy  1963    D&C  . Cystocele repair  1978    /rectocele  . Orif ankle fracture Right 2006  . Total knee arthroplasty Right 1993  . Total knee arthroplasty Left 2001  . Cataract extraction w/ intraocular lens  implant, bilateral Bilateral 2006  . Ankle fracture surgery Right 2011    fall/followed by rehab stay; "hand  to have 9 pins put in it" (03/07/2013)  . Cardiac catheterization  11/2002    [name] 12/11/2002 (03/07/2013)  . Appendectomy  1941    [name] 11/17/1999 (03/07/2013)  . Abdominal hysterectomy  1963    partial/notes 11/17/1999 (03/07/2013   Social History:  reports that she has quit smoking. Her smoking use included Cigarettes. She has a 40 pack-year smoking history. She has never used smokeless tobacco. She reports that she does not drink alcohol or use illicit drugs.  Allergies  Allergen Reactions  . Ciprofloxacin     Nausea Possibly rash and itching- but not sure   . Gabapentin Nausea And Vomiting  . Insulin Glargine     Itching   . Latex     Rash   . Metformin And Related     Severe diarrhea   . Metoclopramide Hcl     REACTION: unspecified  . Nsaids     REACTION: unspecified  . Promethazine Hcl     REACTION: unspecified    Family History  Problem Relation Age of Onset  . Cancer Mother     leukemia  . Coronary artery disease Mother   .  Dementia Mother   . Heart disease Father     CAD  . COPD Sister   . Heart disease Brother   . Diabetes Brother   . Kidney disease Son   . Colon cancer Brother      Prior to Admission medications   Medication Sig Start Date End Date Taking? Authorizing Provider  acetaminophen (TYLENOL) 500 MG tablet Take 500 mg by mouth every 6 (six) hours as needed for pain. For pain    Historical Provider, MD  BD PEN NEEDLE NANO U/F 32G X 4 MM MISC USE AS DIRECTED WITH INSULIN PEN 01/16/13   Judy Pimple, MD  furosemide (LASIX) 40 MG tablet Take 0.5 - 1 tablet by mouth every day 07/05/13   Judy Pimple, MD  glucose blood (ONE TOUCH ULTRA TEST) test strip Check sugars 2x a day 02/14/13   Carlus Pavlov, MD  hydrocortisone (ANUSOL-HC) 25 MG suppository Place 1 suppository (25 mg total) rectally every 12 (twelve) hours. 05/27/13   Meredith Pel, NP  hydrocortisone-pramoxine (PROCTOFOAM [facility]) rectal foam Place 1 applicator rectally 2 (two) times daily.  05/31/13   Meryl Dare, MD  insulin NPH-regular (HUMULIN 70/30) (70-30) 100 UNIT/ML injection Inject under skin 50 units 30 min before breakfast and dinner. Pens please. 05/24/13   Carlus Pavlov, MD  Insulin Syringe-Needle U-100 (BD INSULIN SYRINGE ULTRAFINE) 31G X 15/64" 1 ML MISC 1 each by Does not apply route every morning. Use 2 to 3 times daily as directed. 05/22/13   Carlus Pavlov, MD  levothyroxine (SYNTHROID, LEVOTHROID) 150 MCG tablet Take 1 tablet (150 mcg total) by mouth daily. 11/15/12   Judy Pimple, MD  metoprolol (LOPRESSOR) 50 MG tablet Take 0.5 tablets (25 mg total) by mouth 2 (two) times daily. 11/15/12   Judy Pimple, MD  omeprazole (PRILOSEC) 20 MG capsule Take 1 capsule (20 mg total) by mouth daily. 11/15/12   Judy Pimple, MD  potassium chloride SA (K-DUR,KLOR-CON) 20 MEQ tablet Take 1 tablet by mouth  daily 05/23/13   Judy Pimple, MD  simvastatin (ZOCOR) 40 MG tablet Take 1 tablet (40 mg total) by mouth at bedtime. 11/15/12 11/15/13  Judy Pimple, MD  traMADol (ULTRAM) 50 MG tablet 1-2 pills by mouth up to three times daily as needed for severe pain 01/10/13   Judy Pimple, MD  warfarin (COUMADIN) 5 MG tablet Take 2.5-5 mg by mouth See admin instructions. Takes 5mg  on Monday & 2.5mg  on all other days 02/19/13   Judy Pimple, MD  warfarin (COUMADIN) 5 MG tablet Take as directed by  [facility]. 05/23/13   Judy Pimple, MD   Physical Exam: Filed Vitals:   07/23/13 1956  BP: 154/119  Pulse: 74  Temp: 99.8 F (37.7 C)  Resp: 23    BP 154/119  Pulse 74  Temp(Src) 99.8 F (37.7 C) (Oral)  Resp 23  SpO2 96%  LMP 09/01/1961  General Appearance:    Alert, no distress, appears stated age not oriented  Head:    Normocephalic, atraumatic  Eyes:    PERRL, EOMI, sclera non-icteric        Nose:   Nares without drainage or epistaxis. Mucosa, turbinates normal  Throat:   Moist mucous membranes. Oropharynx without erythema or exudate.  Neck:   Supple. No  carotid bruits.  No thyromegaly.  No lymphadenopathy.   Back:     No CVA tenderness, no spinal tenderness  Lungs:  Clear to auscultation bilaterally, without wheezes, rhonchi or rales  Chest wall:    No tenderness to palpitation  Heart:    Regular rate and rhythm without murmurs, gallops, rubs  Abdomen:     Soft, non-tender, nondistended, normal bowel sounds, no organomegaly  Genitalia:    deferred  Rectal:    deferred  Extremities:   No clubbing, cyanosis or edema.  Pulses:   2+ and symmetric all extremities  Skin:   Skin color, texture, turgor normal, no rashes or lesions  Lymph nodes:   Cervical, supraclavicular, and axillary nodes normal  Neurologic:   CNII-XII intact. Normal strength, sensation and reflexes      throughout    Labs on Admission:  Basic Metabolic Panel:  Recent Labs Lab 07/23/13 2150  NA 140  K 3.6  CL 104  CO2 22  GLUCOSE 63*  BUN 17  CREATININE 1.02  CALCIUM 10.0   Liver Function Tests:  Recent Labs Lab 07/23/13 2150  AST 74*  ALT 56*  ALKPHOS 125*  BILITOT 2.7*  PROT 7.7  ALBUMIN 3.4*   No results found for this basename: LIPASE, AMYLASE,  in the last 168 hours No results found for this basename: AMMONIA,  in the last 168 hours CBC:  Recent Labs Lab 07/23/13 2150  WBC 13.1*  NEUTROABS 10.1*  HGB 11.5*  HCT 35.4*  MCV 94.9  PLT 319   Cardiac Enzymes: No results found for this basename: CKTOTAL, CKMB, CKMBINDEX, TROPONINI,  in the last 168 hours  BNP (last 3 results)  Recent Labs  03/07/13 1820  PROBNP 7836.0*   CBG: No results found for this basename: GLUCAP,  in the last 168 hours  Radiological Exams on Admission: No results found.  EKG: Independently reviewed.  Assessment/Plan Principal Problem:   UTI (lower urinary tract infection) Active Problems:   DIABETES MELLITUS, TYPE II   Atrial fibrillation   1. UTI complicated by delirium - patient also with long and recurrent history of the same in the past, UA  today is suggestive of UTI, patient also running mild temperature of 99.8 and leukocytosis 13.1.  Treating with rocephin and admitting. 2. DM2 - patient is actually hypoglycemic at this time with BGL 63, holding home insulin, putting patient on SSI q4h, and D5 1/2 NS at 75 cc/hr to try and stabilize BGL, holding off on basal insulin at this time though this may become required if she took a home basal and it wears off later this morning. 3. A.Fib - continue coumadin.    Code Status: Full Code  Family Communication: No family in room Disposition Plan: Admit to inpatient   Time spent: 70 min  GARDNER, JARED M. [facility] Pager [phone]  If 7AM-7PM, please contact the day team taking care of the patient [email or web address] Password [email or web address] 07/23/2013, 11:47 PM

## 2013-07-23 NOTE — ED Notes (Signed)
Per EMS, pt has been lethargic since yesterday. Family states pt has been laying in bed since yesterday. Family states pt has hx of UTI's. Pt is A&O X 3.

## 2013-07-23 NOTE — Progress Notes (Signed)
   CARE MANAGEMENT ED NOTE 07/23/2013  Patient:  [name], [name]   Account Number:  [id number]  Date Initiated:  07/23/2013  Documentation initiated by:  Radford Pax  Subjective/Objective Assessment:   Patient presents to Ed with increased confusion and lethargy.     Subjective/Objective Assessment Detail:     Action/Plan:   Action/Plan Detail:   Anticipated DC Date:       Status Recommendation to Physician:   Result of Recommendation:    Other ED Services  Consult Working Plan    DC Planning Services  Other    Choice offered to / List presented to:            Status of service:  Completed, signed off  ED Comments:   ED Comments Detail:  EDCM spoke to aptient and her son at bedside.  Patient reports she lives at home with her husband.  As per patient's son Loraine Leriche, patient's husband is doing, "fine." Patient no longer has [facility] services at home.  Patient reports she has a walker, shower chair and a wheelchair at home.  Patient's son Loraine Leriche reports he checks in on the patient once a day.  Thee patient also has two older sons. One son doesn't check in on her as often while the oldest calls her once a day.  Patient's son reports patient is usually able to wash, dress and feed herself without difficulty.  no further CM needs at this time.

## 2013-07-23 NOTE — ED Notes (Signed)
Leta Speller (son) [phone]

## 2013-07-23 NOTE — ED Provider Notes (Signed)
CSN: [id number]     Arrival date & time 07/23/13  1951 History   First MD Initiated Contact with Patient 07/23/13 2104     Chief Complaint  Patient presents with  . Altered Mental Status   (Consider location/radiation/quality/duration/timing/severity/associated sxs/prior Treatment) HPI Comments: Patient is an 79 year old female with history of atrial fibrillation, GERD, hyperlipidemia, hypertension, hypothyroid, obesity, CHF, asthma, diabetes, vaginal cancer who presents today with change in mental status. She has become more lethargic and not getting out of bed. Per the family pt has hx of UTIs. Despite the triage note, on my exam the patient is not oriented to place or time. Patient continues to states, "It is July the 4th, 2003".   The history is provided by the patient. No language interpreter was used.    Past Medical History  Diagnosis Date  . Atrial fibrillation   . GERD (gastroesophageal reflux disease)   . Hyperlipidemia   . Hypertension   . Hypothyroid   . Obesity   . Vaginal dysplasia 1980  . Rosacea   . Chest pain 11/2002    cardiac cath neg   . Back pain   . IBS (irritable bowel syndrome)     with diarrhea  . Asthma     as a child  . Adenomatous colon polyp 12/2007  . Hemorrhoids   . Vertigo   . Hiatal hernia 12/2007    EGD  . CHF (congestive heart failure)   . Complication of anesthesia     "dr said I should never be put to sleep again" (03/07/2013)  . Exertional shortness of breath   . Type II diabetes mellitus   . Iron deficiency anemia   . Arthritis     "all over my body" (03/07/2013)  . Vaginal cancer   . History of cerebrovascular disease 04/24/2013  . Abnormality of gait 04/24/2013   Past Surgical History  Procedure Laterality Date  . Cholecystectomy  2006  . Joint replacement      Rt TKR 1993 & Lt TKR 2001  . Cervical cone biopsy  1963    D&C  . Cystocele repair  1978    /rectocele  . Orif ankle fracture Right 2006  . Total knee arthroplasty  Right 1993  . Total knee arthroplasty Left 2001  . Cataract extraction w/ intraocular lens  implant, bilateral Bilateral 2006  . Ankle fracture surgery Right 2011    fall/followed by rehab stay; "hand to have 9 pins put in it" (03/07/2013)  . Cardiac catheterization  11/2002    [name] 12/11/2002 (03/07/2013)  . Appendectomy  1941    [name] 11/17/1999 (03/07/2013)  . Abdominal hysterectomy  1963    partial/notes 11/17/1999 (03/07/2013   Family History  Problem Relation Age of Onset  . Cancer Mother     leukemia  . Coronary artery disease Mother   . Dementia Mother   . Heart disease Father     CAD  . COPD Sister   . Heart disease Brother   . Diabetes Brother   . Kidney disease Son   . Colon cancer Brother    History  Substance Use Topics  . Smoking status: Former Smoker -- 1.00 packs/day for 40 years    Types: Cigarettes  . Smokeless tobacco: Never Used     Comment: 03/07/2013 "quit smoking 35 years ago"  . Alcohol Use: No   OB History   Grav Para Term Preterm Abortions TAB SAB Ect Mult Living  Review of Systems  Unable to perform ROS: Mental status change    Allergies  Ciprofloxacin; Gabapentin; Insulin glargine; Latex; Metformin and related; Metoclopramide hcl; Nsaids; and Promethazine hcl  Home Medications   Current Outpatient Rx  Name  Route  Sig  Dispense  Refill  . acetaminophen (TYLENOL) 500 MG tablet   Oral   Take 500 mg by mouth every 6 (six) hours as needed for pain. For pain         . BD PEN NEEDLE NANO U/F 32G X 4 MM MISC      USE AS DIRECTED WITH INSULIN PEN   100 each   1   . furosemide (LASIX) 40 MG tablet      Take 0.5 - 1 tablet by mouth every day   30 tablet   5   . glucose blood (ONE TOUCH ULTRA TEST) test strip      Check sugars 2x a day   200 each   11   . hydrocortisone (ANUSOL-HC) 25 MG suppository   Rectal   Place 1 suppository (25 mg total) rectally every 12 (twelve) hours.   10 suppository   1   .  hydrocortisone-pramoxine (PROCTOFOAM HC) rectal foam   Rectal   Place 1 applicator rectally 2 (two) times daily.   10 g   0   . insulin NPH-regular (HUMULIN 70/30) (70-30) 100 UNIT/ML injection      Inject under skin 50 units 30 min before breakfast and dinner. Pens please.   30 mL   5     VIALS   . Insulin Syringe-Needle U-100 (BD INSULIN SYRINGE ULTRAFINE) 31G X 15/64" 1 ML MISC   Does not apply   1 each by Does not apply route every morning. Use 2 to 3 times daily as directed.   100 each   5   . levothyroxine (SYNTHROID, LEVOTHROID) 150 MCG tablet   Oral   Take 1 tablet (150 mcg total) by mouth daily.   90 tablet   1   . metoprolol (LOPRESSOR) 50 MG tablet   Oral   Take 0.5 tablets (25 mg total) by mouth 2 (two) times daily.   90 tablet   1   . omeprazole (PRILOSEC) 20 MG capsule   Oral   Take 1 capsule (20 mg total) by mouth daily.   90 capsule   1   . potassium chloride SA (K-DUR,KLOR-CON) 20 MEQ tablet      Take 1 tablet by mouth  daily   90 tablet   1   . simvastatin (ZOCOR) 40 MG tablet   Oral   Take 1 tablet (40 mg total) by mouth at bedtime.   90 tablet   1   . traMADol (ULTRAM) 50 MG tablet      1-2 pills by mouth up to three times daily as needed for severe pain   540 tablet   3   . warfarin (COUMADIN) 5 MG tablet   Oral   Take 2.5-5 mg by mouth See admin instructions. Takes 5mg  on Monday & 2.5mg  on all other days         . warfarin (COUMADIN) 5 MG tablet      Take as directed by  [facility].   30 tablet   2    BP 154/119  Pulse 74  Temp(Src) 99.8 F (37.7 C) (Oral)  Resp 23  SpO2 96%  LMP 09/01/1961 Physical Exam  Nursing note and vitals reviewed. Constitutional: She appears  well-developed and well-nourished. No distress.  HENT:  Head: Normocephalic and atraumatic.  Right Ear: External ear normal.  Left Ear: External ear normal.  Nose: Nose normal.  Mouth/Throat: Oropharynx is clear and moist.  Eyes: Conjunctivae  are normal.  Neck: Normal range of motion.  Cardiovascular: Normal rate, regular rhythm and normal heart sounds.   Pulmonary/Chest: Effort normal and breath sounds normal. No stridor. No respiratory distress. She has no wheezes. She has no rales.  Abdominal: Soft. She exhibits no distension. There is generalized tenderness. There is no rigidity, no rebound and no guarding.  Musculoskeletal: Normal range of motion.  Neurological: She is alert. She has normal strength.  Oriented to person  Skin: Skin is warm and dry. She is not diaphoretic. No erythema.  Open areas of sloughed off skin on groin bilaterally. Area under left breast. No associated induration.   Psychiatric: She has a normal mood and affect. Her behavior is normal.    ED Course  Procedures (including critical care time) Labs Review Labs Reviewed  CBC WITH DIFFERENTIAL - Abnormal; Notable for the following:    WBC 13.1 (*)    RBC 3.73 (*)    Hemoglobin 11.5 (*)    HCT 35.4 (*)    RDW 16.0 (*)    Neutrophils Relative % 78 (*)    Neutro Abs 10.1 (*)    All other components within normal limits  COMPREHENSIVE METABOLIC PANEL - Abnormal; Notable for the following:    Glucose, Bld 63 (*)    Albumin 3.4 (*)    AST 74 (*)    ALT 56 (*)    Alkaline Phosphatase 125 (*)    Total Bilirubin 2.7 (*)    GFR calc non Af Amer 48 (*)    GFR calc Af Amer 55 (*)    All other components within normal limits  URINALYSIS, ROUTINE W REFLEX MICROSCOPIC - Abnormal; Notable for the following:    Color, Urine AMBER (*)    APPearance CLOUDY (*)    Hgb urine dipstick TRACE (*)    Bilirubin Urine SMALL (*)    Nitrite POSITIVE (*)    Leukocytes, UA MODERATE (*)    All other components within normal limits  URINE MICROSCOPIC-ADD ON - Abnormal; Notable for the following:    Bacteria, UA MANY (*)    All other components within normal limits  CULTURE, BLOOD (ROUTINE X 2)  CULTURE, BLOOD (ROUTINE X 2)  URINE CULTURE  CG4 I-STAT (LACTIC ACID)   POCT I-STAT TROPONIN I   Imaging Review No results found.  EKG Interpretation   None       MDM  No diagnosis found.  Patient presents to ED with worsening mental status. Pt found to have UTI. Patient given Rocephin. Pt admitted to medicine. Admission appreciated. Vital signs stable at this time. Patient / Family / Caregiver informed of clinical course, understand medical decision-making process, and agree with plan.     Mora Bellman, PA-C 07/23/13 2258

## 2013-07-24 ENCOUNTER — Other Ambulatory Visit: Payer: Self-pay | Admitting: Family Medicine

## 2013-07-24 ENCOUNTER — Encounter (HOSPITAL_COMMUNITY): Payer: Self-pay

## 2013-07-24 DIAGNOSIS — I10 Essential (primary) hypertension: Secondary | ICD-10-CM

## 2013-07-24 DIAGNOSIS — E1142 Type 2 diabetes mellitus with diabetic polyneuropathy: Secondary | ICD-10-CM

## 2013-07-24 DIAGNOSIS — N390 Urinary tract infection, site not specified: Principal | ICD-10-CM

## 2013-07-24 DIAGNOSIS — N10 Acute tubulo-interstitial nephritis: Secondary | ICD-10-CM

## 2013-07-24 DIAGNOSIS — E1149 Type 2 diabetes mellitus with other diabetic neurological complication: Secondary | ICD-10-CM

## 2013-07-24 DIAGNOSIS — R269 Unspecified abnormalities of gait and mobility: Secondary | ICD-10-CM

## 2013-07-24 LAB — GLUCOSE, CAPILLARY
Glucose-Capillary: 127 mg/dL — ABNORMAL HIGH (ref 70–99)
Glucose-Capillary: 122 mg/dL — ABNORMAL HIGH (ref 70–99)
Glucose-Capillary: 156 mg/dL — ABNORMAL HIGH (ref 70–99)
Glucose-Capillary: 306 mg/dL — ABNORMAL HIGH (ref 70–99)

## 2013-07-24 LAB — CBC
HCT: 32.8 % — ABNORMAL LOW (ref 36.0–46.0)
MCH: 30.6 pg (ref 26.0–34.0)
MCHC: 31.7 g/dL (ref 30.0–36.0)
MCV: 96.5 fL (ref 78.0–100.0)
RDW: 16 % — ABNORMAL HIGH (ref 11.5–15.5)

## 2013-07-24 LAB — BASIC METABOLIC PANEL
BUN: 16 mg/dL (ref 6–23)
CO2: 26 mEq/L (ref 19–32)
Calcium: 9 mg/dL (ref 8.4–10.5)
Creatinine, Ser: 1.08 mg/dL (ref 0.50–1.10)
GFR calc Af Amer: 52 mL/min — ABNORMAL LOW (ref >90)
GFR calc non Af Amer: 44 mL/min — ABNORMAL LOW (ref >90)
Glucose, Bld: 123 mg/dL — ABNORMAL HIGH (ref 70–99)
Sodium: 138 mEq/L (ref 135–145)

## 2013-07-24 LAB — PROTIME-INR
INR: 2.4 — ABNORMAL HIGH (ref 0.00–1.49)
Prothrombin Time: 25.4 seconds — ABNORMAL HIGH (ref 11.6–15.2)

## 2013-07-24 MED ORDER — INSULIN ASPART 100 UNIT/ML ~~LOC~~ SOLN
0.0000 [IU] | Freq: Three times a day (TID) | SUBCUTANEOUS | Status: DC
  Administered 2013-07-25: 7 [IU] via SUBCUTANEOUS
  Administered 2013-07-25: 9 [IU] via SUBCUTANEOUS
  Administered 2013-07-25 – 2013-07-26 (×2): 7 [IU] via SUBCUTANEOUS
  Administered 2013-07-26: 6 [IU] via SUBCUTANEOUS
  Administered 2013-07-27: 2 [IU] via SUBCUTANEOUS
  Administered 2013-07-27: 3 [IU] via SUBCUTANEOUS
  Administered 2013-07-27: 2 [IU] via SUBCUTANEOUS
  Administered 2013-07-28: 9 [IU] via SUBCUTANEOUS
  Administered 2013-07-28: 5 [IU] via SUBCUTANEOUS

## 2013-07-24 MED ORDER — WARFARIN - PHARMACIST DOSING INPATIENT: Freq: Every day | Status: DC

## 2013-07-24 MED ORDER — POTASSIUM CHLORIDE CRYS ER 20 MEQ PO TBCR
40.0000 meq | EXTENDED_RELEASE_TABLET | Freq: Once | ORAL | Status: AC
  Administered 2013-07-24: 40 meq via ORAL
  Filled 2013-07-24 (×2): qty 2

## 2013-07-24 MED ORDER — WARFARIN SODIUM 2.5 MG PO TABS
2.5000 mg | ORAL_TABLET | Freq: Once | ORAL | Status: AC
  Administered 2013-07-24: 2.5 mg via ORAL
  Filled 2013-07-24 (×2): qty 1

## 2013-07-24 MED ORDER — DIPHENHYDRAMINE HCL 25 MG PO CAPS
25.0000 mg | ORAL_CAPSULE | Freq: Four times a day (QID) | ORAL | Status: DC | PRN
  Administered 2013-07-25: 25 mg via ORAL
  Filled 2013-07-24: qty 1

## 2013-07-24 MED ORDER — GLUCERNA SHAKE PO LIQD
237.0000 mL | ORAL | Status: DC
  Administered 2013-07-24 – 2013-07-26 (×2): 237 mL via ORAL
  Filled 2013-07-24 (×5): qty 237

## 2013-07-24 MED ORDER — ONDANSETRON HCL 4 MG/2ML IJ SOLN
4.0000 mg | Freq: Four times a day (QID) | INTRAMUSCULAR | Status: DC | PRN
  Administered 2013-07-25 (×2): 4 mg via INTRAVENOUS
  Filled 2013-07-24 (×2): qty 2

## 2013-07-24 NOTE — Progress Notes (Signed)
Utilization review completed.  

## 2013-07-24 NOTE — Progress Notes (Signed)
CSW received referral that pt may potentially need placement.  CSW spoke with [facility] who had attempted to speak with pt at bedside, but per [facility] report pt kept falling asleep and was unable to participate in assessment at this time. RNCM contacted pt son via telephone to discuss possible disposition needs between home health and potential SNF depending on pt progress. Per RNCM, pt son plans for pt to return home with home health services at this time and felt that pt would clear and progress to be able to return home.   PT/OT evaluations ordered and please consult CSW if social work needs arise.  No social work needs identified at this time.  Jacklynn Lewis, MSW, LCSW Clinical Social Work Coverage for [employer] [phone]

## 2013-07-24 NOTE — ED Provider Notes (Signed)
Medical screening examination/treatment/procedure(s) were performed by non-physician practitioner and as supervising physician I was immediately available for consultation/collaboration.  Shanna Cisco, MD 07/24/13 1058

## 2013-07-24 NOTE — Progress Notes (Signed)
ANTICOAGULATION CONSULT NOTE  Pharmacy Consult for Warfarin Indication: atrial fibrillation  Allergies  Allergen Reactions  . Ciprofloxacin     Nausea Possibly rash and itching- but not sure   . Gabapentin Nausea And Vomiting  . Insulin Glargine     Itching   . Latex     Rash   . Metformin And Related     Severe diarrhea   . Metoclopramide Hcl     REACTION: unspecified  . Nsaids     REACTION: unspecified  . Promethazine Hcl     REACTION: unspecified    Patient Measurements: Weight: 254 lb 1.6 oz (115.259 kg)   Vital Signs: Temp: 97.5 F (36.4 C) (12/24 0600) Temp src: Oral (12/24 0600) BP: 142/74 mmHg (12/24 0600) Pulse Rate: 78 (12/24 0600)  Labs:  Recent Labs  07/23/13 2150 07/24/13 0540  HGB 11.5* 10.4*  HCT 35.4* 32.8*  PLT 319 267  LABPROT  --  25.4*  INR  --  2.40*  CREATININE 1.02 1.08    The CrCl is unknown because both a height and weight (above a minimum accepted value) are required for this calculation.   Medical History: Past Medical History  Diagnosis Date  . Atrial fibrillation   . GERD (gastroesophageal reflux disease)   . Hyperlipidemia   . Hypertension   . Hypothyroid   . Obesity   . Vaginal dysplasia 1980  . Rosacea   . Chest pain 11/2002    cardiac cath neg   . Back pain   . IBS (irritable bowel syndrome)     with diarrhea  . Asthma     as a child  . Adenomatous colon polyp 12/2007  . Hemorrhoids   . Vertigo   . Hiatal hernia 12/2007    EGD  . CHF (congestive heart failure)   . Complication of anesthesia     "dr said I should never be put to sleep again" (03/07/2013)  . Exertional shortness of breath   . Type II diabetes mellitus   . Iron deficiency anemia   . Arthritis     "all over my body" (03/07/2013)  . Vaginal cancer   . History of cerebrovascular disease 04/24/2013  . Abnormality of gait 04/24/2013    Medications:  Scheduled:  . cefTRIAXone (ROCEPHIN) IVPB 1 gram/50 mL D5W  1 g Intravenous Q24H  .  furosemide  40 mg Oral Daily  . hydrocortisone  25 mg Rectal Q12H  . hydrocortisone-pramoxine  1 applicator Rectal BID  . insulin aspart  0-9 Units Subcutaneous Q4H  . levothyroxine  150 mcg Oral Daily  . metoprolol  25 mg Oral BID  . pantoprazole  40 mg Oral Daily  . potassium chloride SA  20 mEq Oral Daily  . simvastatin  40 mg Oral QHS   Infusions:  . dextrose 5 % and 0.45% NaCl 75 mL/hr at 07/23/13 2345    Assessment: 79 yo F on chronic warfarin therapy for atrial fibrillation, admitted with altered mental status and UTI.  Patient is followed by [facility];  Home warfarin dosage reported as 2.5 mg daily except 5 mg on Mondays.  INR therapeutic today  Concomitant ceftriaxone for UTI noted  Goal of Therapy:  INR 2-3    Plan:  1. Warfarin 2.5 mg PO x 1 today as per patient's usual regimen. 2. PT/INR daily while inpatient.   Elie Goody, PharmD, BCPS Pager: [phone] 07/24/2013  8:43 AM

## 2013-07-24 NOTE — Evaluation (Signed)
Physical Therapy Evaluation Patient Details Name: [name] MRN: [id number] DOB: 04/13/1923 Today's Date: 07/24/2013 Time: 4098-1191 PT Time Calculation (min): 21 min  PT Assessment / Plan / Recommendation History of Present Illness  79 y.o. female who presented to the ED with AMS. She has been getting more lethargic and not getting out of bed over the past few days. Per family she also has an extensive history of UTIs, delerium with UTI.  Clinical Impression  Pt will benefit from PT to address deficits below;    PT Assessment  Patient needs continued PT services    Follow Up Recommendations  Home health PT (vs SNF but last time pt & family didn't want SNF)    Does the patient have the potential to tolerate intense rehabilitation      Barriers to Discharge        Equipment Recommendations  None recommended by PT    Recommendations for Other Services     Frequency Min 3X/week    Precautions / Restrictions Precautions Precautions: Fall   Pertinent Vitals/Pain C/o knee pain but declines requesting meds      Mobility  Bed Mobility Bed Mobility: Supine to Sit Supine to Sit: HOB elevated;4: Min assist Details for Bed Mobility Assistance: assist with trunk Transfers Transfers: Sit to Stand;Stand to Sit Sit to Stand: 4: Min assist Stand to Sit: 4: Min assist Details for Transfer Assistance: pt pulls up on walker at her baseline Ambulation/Gait Ambulation/Gait Assistance: 4: Min assist Ambulation Distance (Feet): 12 Feet Assistive device: Rolling walker Ambulation/Gait Assistance Details: cues for  posture and RW safety Gait Pattern: Step-to pattern;Trunk flexed    Exercises     PT Diagnosis: Difficulty walking;Generalized weakness  PT Problem List: Decreased strength;Decreased activity tolerance;Decreased balance;Decreased mobility;Decreased knowledge of use of DME PT Treatment Interventions: Gait training;Functional mobility training;Therapeutic  activities;Therapeutic exercise;Patient/family education;DME instruction     PT Goals(Current goals can be found in the care plan section) Acute Rehab PT Goals Patient Stated Goal: does not state PT Goal Formulation: With patient Time For Goal Achievement: 07/31/13 Potential to Achieve Goals: Good  Visit Information  Last PT Received On: 07/24/13 Assistance Needed: +2 History of Present Illness: 79 y.o. female who presented to the ED with AMS. She has been getting more lethargic and not getting out of bed over the past few days. Per family she also has an extensive history of UTIs, delerium with UTI.       Prior Functioning  Home Living Family/patient expects to be discharged to:: Unsure Living Arrangements: Spouse/significant other Available Help at Discharge: Family Type of Home: House Home Access: Ramped entrance Home Layout: One level Home Equipment: Environmental consultant - 2 wheels Additional Comments: last adm 4 mos ago pt went home as she and sone were reluctant to go to SNF Prior Function Level of Independence: Independent with assistive device(s) Comments: amb with RW at baseline;  Communication Communication: HOH    Cognition  Cognition Arousal/Alertness: Lethargic Behavior During Therapy: WFL for tasks assessed/performed Overall Cognitive Status: Within Functional Limits for tasks assessed (pt adm with AMS due to UTI)    Extremity/Trunk Assessment Upper Extremity Assessment Upper Extremity Assessment: Generalized weakness Lower Extremity Assessment Lower Extremity Assessment: Generalized weakness   Balance    End of Session PT - End of Session Equipment Utilized During Treatment: Gait belt Activity Tolerance: Patient tolerated treatment well Patient left: in chair;with call bell/phone within reach Nurse Communication: Mobility status  GP     [facility] 07/24/2013, 3:20 PM

## 2013-07-24 NOTE — Progress Notes (Signed)
Patient ID: [name], female   DOB: 05/04/1923, 79 y.o.   MRN: [id number] [facility] PROGRESS NOTE  [name] WUJ:[id number] DOB: 1923-01-10 DOA: 07/23/2013 PCP: [name], MD  Brief narrative: 79 y.o. female who presented to the ED with AMS. She has been getting more lethargic and not getting out of bed over the past few days. Per family she also has an extensive history of UTIs, delerium with UTI.  Principal Problem:   UTI (lower urinary tract infection) - continue Rocephin day #2 - stop IVF and encourage PO intake - follow up on urine culture  Active Problems:   DIABETES MELLITUS, TYPE II - continue SSI until pt's oral intake improves at which point we can start home medical regimen    Atrial fibrillation - rate controlled - continue Coumadin per pharmacy  - continue Metoprolol    Hypokalemia - mild, will supplement and repeat BMP in AM   HLD - continue statin   Hypothyroidism - continue Synthroid    Leukocytosis - secondary to UTI - now resolved  Consultants:  None  Procedures/Studies:  None  Antibiotics:  Rocephin 12/23 -->  Code Status: Full Family Communication: Pt and son at bedside Disposition Plan: Home when medically stable  HPI/Subjective: No events overnight.   Objective: Filed Vitals:   07/23/13 1956 07/23/13 2330 07/24/13 0600  BP: 154/119 114/51 142/74  Pulse: 74 83 78  Temp: 99.8 F (37.7 C) 98.3 F (36.8 C) 97.5 F (36.4 C)  TempSrc: Oral Oral Oral  Resp: 23 20 20   Weight:  115.259 kg (254 lb 1.6 oz) 115.259 kg (254 lb 1.6 oz)  SpO2: 96% 97% 97%   No intake or output data in the 24 hours ending 07/24/13 0946  Exam:   General:  Pt is alert, follows commands appropriately, not in acute distress  Cardiovascular: Irregular rate and rhythm,  no rubs, no gallops  Respiratory: Clear to auscultation bilaterally, no wheezing, no crackles, no rhonchi  Abdomen: Soft, non tender, non distended, bowel sounds  present, no guarding  Extremities: No edema, pulses DP and PT palpable bilaterally  Neuro: Grossly nonfocal  Data Reviewed: Basic Metabolic Panel:  Recent Labs Lab 07/23/13 2150 07/24/13 0540  NA 140 138  K 3.6 3.4*  CL 104 103  CO2 22 26  GLUCOSE 63* 123*  BUN 17 16  CREATININE 1.02 1.08  CALCIUM 10.0 9.0   Liver Function Tests:  Recent Labs Lab 07/23/13 2150  AST 74*  ALT 56*  ALKPHOS 125*  BILITOT 2.7*  PROT 7.7  ALBUMIN 3.4*   CBC:  Recent Labs Lab 07/23/13 2150 07/24/13 0540  WBC 13.1* 8.5  NEUTROABS 10.1*  --   HGB 11.5* 10.4*  HCT 35.4* 32.8*  MCV 94.9 96.5  PLT 319 267   CBG:  Recent Labs Lab 07/24/13 0021 07/24/13 0059 07/24/13 0451 07/24/13 0733  GLUCAP 53* 84 127* 122*   Scheduled Meds: . cefTRIAXone  1 g Intravenous Q24H  . furosemide  40 mg Oral Daily  . hydrocortisone  25 mg Rectal Q12H  . hydrocortisone-pramoxine  1 applicator Rectal BID  . insulin aspart  0-9 Units Subcutaneous Q4H  . levothyroxine  150 mcg Oral Daily  . metoprolol  25 mg Oral BID  . pantoprazole  40 mg Oral Daily  . potassium chloride SA  20 mEq Oral Daily  . simvastatin  40 mg Oral QHS  . warfarin  2.5 mg Oral ONCE-1800   Continuous Infusions: . dextrose 5 %  and 0.45% NaCl 75 mL/hr at 07/23/13 2345    Debbora Presto, MD  [facility] Pager [phone]  If 7PM-7AM, please contact night-coverage [email or web address] Password [facility] 07/24/2013, 9:46 AM   LOS: 1 day

## 2013-07-24 NOTE — Progress Notes (Signed)
INITIAL NUTRITION ASSESSMENT  DOCUMENTATION CODES Per approved criteria  -Morbid Obesity   INTERVENTION: Continue CHO MOD diet when alert Glucerna once daily  NUTRITION DIAGNOSIS: Inadequate oral intake related to decreased alertness as evidenced by observation.   Goal: Intake of meals to meet >75% of estimated needs.  Monitor:  Intake, labs, weight  Reason for Assessment: low braden  79 y.o. female  Admitting Dx: UTI (lower urinary tract infection)  ASSESSMENT: Patient admitted with AMS secondary to UTI.  Lethargic and unable to answer anything but basic questions.  States that her diet was not healthy at home.  Stated that she was on Coumadin and modified her diet because of this.  Unable to state her UBW.  Current intake decreased secondary to lethargy.  Height: Ht Readings from Last 1 Encounters:  05/27/13 5\' 2"  (1.575 m)    Weight: Wt Readings from Last 1 Encounters:  07/24/13 254 lb 1.6 oz (115.259 kg)    Ideal Body Weight: 110 lbs  % Ideal Body Weight: 231  Wt Readings from Last 10 Encounters:  07/24/13 254 lb 1.6 oz (115.259 kg)  03/03/11 253 lb (114.76 kg)  03/07/13 240 lb 11.9 oz (109.2 kg)  12/05/12 238 lb (107.956 kg)  09/27/12 249 lb (112.946 kg)  07/17/12 254 lb 12 oz (115.554 kg)  06/21/12 248 lb 0.3 oz (112.5 kg)  10/20/11 237 lb 8 oz (107.729 kg)  03/21/11 250 lb 12 oz (113.739 kg)  11/24/10 248 lb 4 oz (112.605 kg)    Usual Body Weight: 253  % Usual Body Weight: 100  BMI:  Body mass index is 46.46 kg/(m^2).  Estimated Nutritional Needs: Kcal: 1600-1700 Protein: 100-125 gm protein Fluid: >2L  Skin: wnl  Diet Order: Carb Control  EDUCATION NEEDS: -No education needs identified at this time  No intake or output data in the 24 hours ending 07/24/13 1152   Labs:   Recent Labs Lab 07/23/13 2150 07/24/13 0540  NA 140 138  K 3.6 3.4*  CL 104 103  CO2 22 26  BUN 17 16  CREATININE 1.02 1.08  CALCIUM 10.0 9.0  GLUCOSE 63*  123*    CBG (last 3)   Recent Labs  07/24/13 0059 07/24/13 0451 07/24/13 0733  GLUCAP 84 127* 122*    Scheduled Meds: . cefTRIAXone (ROCEPHIN) IVPB 1 gram/50 mL D5W  1 g Intravenous Q24H  . furosemide  40 mg Oral Daily  . hydrocortisone  25 mg Rectal Q12H  . hydrocortisone-pramoxine  1 applicator Rectal BID  . insulin aspart  0-9 Units Subcutaneous Q4H  . levothyroxine  150 mcg Oral Daily  . metoprolol  25 mg Oral BID  . pantoprazole  40 mg Oral Daily  . potassium chloride SA  20 mEq Oral Daily  . simvastatin  40 mg Oral QHS  . warfarin  2.5 mg Oral ONCE-1800  . Warfarin - Pharmacist Dosing Inpatient   Does not apply q1800    Continuous Infusions: . dextrose 5 % and 0.45% NaCl 75 mL/hr at 07/23/13 2345    Past Medical History  Diagnosis Date  . Atrial fibrillation   . GERD (gastroesophageal reflux disease)   . Hyperlipidemia   . Hypertension   . Hypothyroid   . Obesity   . Vaginal dysplasia 1980  . Rosacea   . Chest pain 11/2002    cardiac cath neg   . Back pain   . IBS (irritable bowel syndrome)     with diarrhea  . Asthma  as a child  . Adenomatous colon polyp 12/2007  . Hemorrhoids   . Vertigo   . Hiatal hernia 12/2007    EGD  . CHF (congestive heart failure)   . Complication of anesthesia     "dr said I should never be put to sleep again" (03/07/2013)  . Exertional shortness of breath   . Type II diabetes mellitus   . Iron deficiency anemia   . Arthritis     "all over my body" (03/07/2013)  . Vaginal cancer   . History of cerebrovascular disease 04/24/2013  . Abnormality of gait 04/24/2013    Past Surgical History  Procedure Laterality Date  . Cholecystectomy  2006  . Joint replacement      Rt TKR 1993 & Lt TKR 2001  . Cervical cone biopsy  1963    D&C  . Cystocele repair  1978    /rectocele  . Orif ankle fracture Right 2006  . Total knee arthroplasty Right 1993  . Total knee arthroplasty Left 2001  . Cataract extraction w/ intraocular  lens  implant, bilateral Bilateral 2006  . Ankle fracture surgery Right 2011    fall/followed by rehab stay; "hand to have 9 pins put in it" (03/07/2013)  . Cardiac catheterization  11/2002    [name] 12/11/2002 (03/07/2013)  . Appendectomy  1941    [name] 11/17/1999 (03/07/2013)  . Abdominal hysterectomy  1963    partial/notes 11/17/1999 (03/07/2013    Oran Rein, RD, LDN Clinical Inpatient Dietitian Pager:  [phone] Weekend and after hours pager:  [phone]

## 2013-07-24 NOTE — Care Management Note (Signed)
CM spoke with patient at bedside who was too lethargic to participate in consult. Pt provided Cm with permission to contact son Cherina Dhillon at [phone]. Per pt's son pt has had this change in mental status with a previous UTI. Per son pt usually clears up when UTI is resolved. Per pt's son pt would prefer to discharge home with home health services if recommended. Per son patient has used [facility] in the past. Will continue to follow.     [name] Marrian Bells,MSN,RN [phone]

## 2013-07-24 NOTE — Progress Notes (Signed)
ANTICOAGULATION CONSULT NOTE - Initial Consult  Pharmacy Consult for Warfarin Indication: atrial fibrillation  Allergies  Allergen Reactions  . Ciprofloxacin     Nausea Possibly rash and itching- but not sure   . Gabapentin Nausea And Vomiting  . Insulin Glargine     Itching   . Latex     Rash   . Metformin And Related     Severe diarrhea   . Metoclopramide Hcl     REACTION: unspecified  . Nsaids     REACTION: unspecified  . Promethazine Hcl     REACTION: unspecified    Patient Measurements: Weight: 254 lb 1.6 oz (115.259 kg)   Vital Signs: Temp: 98.3 F (36.8 C) (12/23 2330) Temp src: Oral (12/23 2330) BP: 114/51 mmHg (12/23 2330) Pulse Rate: 83 (12/23 2330)  Labs:  Recent Labs  07/23/13 2150 07/24/13 0540  HGB 11.5* 10.4*  HCT 35.4* 32.8*  PLT 319 267  LABPROT  --  25.4*  INR  --  2.40*  CREATININE 1.02  --     The CrCl is unknown because both a height and weight (above a minimum accepted value) are required for this calculation.   Medical History: Past Medical History  Diagnosis Date  . Atrial fibrillation   . GERD (gastroesophageal reflux disease)   . Hyperlipidemia   . Hypertension   . Hypothyroid   . Obesity   . Vaginal dysplasia 1980  . Rosacea   . Chest pain 11/2002    cardiac cath neg   . Back pain   . IBS (irritable bowel syndrome)     with diarrhea  . Asthma     as a child  . Adenomatous colon polyp 12/2007  . Hemorrhoids   . Vertigo   . Hiatal hernia 12/2007    EGD  . CHF (congestive heart failure)   . Complication of anesthesia     "dr said I should never be put to sleep again" (03/07/2013)  . Exertional shortness of breath   . Type II diabetes mellitus   . Iron deficiency anemia   . Arthritis     "all over my body" (03/07/2013)  . Vaginal cancer   . History of cerebrovascular disease 04/24/2013  . Abnormality of gait 04/24/2013    Medications:  Scheduled:  . cefTRIAXone (ROCEPHIN) IVPB 1 gram/50 mL D5W  1 g  Intravenous Q24H  . furosemide  40 mg Oral Daily  . hydrocortisone  25 mg Rectal Q12H  . hydrocortisone-pramoxine  1 applicator Rectal BID  . insulin aspart  0-9 Units Subcutaneous Q4H  . levothyroxine  150 mcg Oral Daily  . metoprolol  25 mg Oral BID  . pantoprazole  40 mg Oral Daily  . potassium chloride SA  20 mEq Oral Daily  . simvastatin  40 mg Oral QHS   Infusions:  . dextrose 5 % and 0.45% NaCl      Assessment: 79 yo admitted with mental status changes on chronic warfarin for A-fib. Goal of Therapy:  INR 2-3    Plan:   Daily PT/INR  Education  Susanne Greenhouse R 07/24/2013,6:37 AM

## 2013-07-24 NOTE — Progress Notes (Signed)
Pt arrived to floor  Via stretcher room 1505. VS taken and no complications. Pt lethargic and incoherent, alert to self only. Will continue  To monitor thru shift. Initial assessment complete

## 2013-07-24 NOTE — Progress Notes (Signed)
Pts CBG 53, PT arousable. 8 oz OJ given . 15 MIN recheck 84.

## 2013-07-24 NOTE — Progress Notes (Signed)
Cbg 306 was taken after pt ate a serving of french fries and a sweet potato.

## 2013-07-25 LAB — COMPREHENSIVE METABOLIC PANEL
AST: 25 U/L (ref 0–37)
Albumin: 2.8 g/dL — ABNORMAL LOW (ref 3.5–5.2)
Alkaline Phosphatase: 121 U/L — ABNORMAL HIGH (ref 39–117)
CO2: 25 mEq/L (ref 19–32)
Calcium: 8.8 mg/dL (ref 8.4–10.5)
Chloride: 99 mEq/L (ref 96–112)
Creatinine, Ser: 1.19 mg/dL — ABNORMAL HIGH (ref 0.50–1.10)
GFR calc Af Amer: 46 mL/min — ABNORMAL LOW (ref >90)
GFR calc non Af Amer: 40 mL/min — ABNORMAL LOW (ref >90)
Potassium: 3.8 mEq/L (ref 3.5–5.1)
Total Protein: 6.6 g/dL (ref 6.0–8.3)

## 2013-07-25 LAB — URINE CULTURE: Colony Count: >=100000

## 2013-07-25 LAB — PROTIME-INR
INR: 2.29 — ABNORMAL HIGH (ref 0.00–1.49)
Prothrombin Time: 24.5 seconds — ABNORMAL HIGH (ref 11.6–15.2)

## 2013-07-25 LAB — CBC
HCT: 32.1 % — ABNORMAL LOW (ref 36.0–46.0)
Hemoglobin: 10.1 g/dL — ABNORMAL LOW (ref 12.0–15.0)
MCH: 30.5 pg (ref 26.0–34.0)
RBC: 3.31 MIL/uL — ABNORMAL LOW (ref 3.87–5.11)

## 2013-07-25 LAB — GLUCOSE, CAPILLARY
Glucose-Capillary: 305 mg/dL — ABNORMAL HIGH (ref 70–99)
Glucose-Capillary: 359 mg/dL — ABNORMAL HIGH (ref 70–99)
Glucose-Capillary: 299 mg/dL — ABNORMAL HIGH (ref 70–99)

## 2013-07-25 MED ORDER — WARFARIN SODIUM 2.5 MG PO TABS
2.5000 mg | ORAL_TABLET | Freq: Once | ORAL | Status: AC
  Administered 2013-07-25: 2.5 mg via ORAL
  Filled 2013-07-25: qty 1

## 2013-07-25 MED ORDER — INSULIN ASPART PROT & ASPART (70-30 MIX) 100 UNIT/ML ~~LOC~~ SUSP
30.0000 [IU] | Freq: Two times a day (BID) | SUBCUTANEOUS | Status: DC
  Administered 2013-07-25 – 2013-07-28 (×6): 30 [IU] via SUBCUTANEOUS
  Filled 2013-07-25: qty 10

## 2013-07-25 MED ORDER — FUROSEMIDE 20 MG PO TABS
20.0000 mg | ORAL_TABLET | Freq: Every day | ORAL | Status: DC
  Administered 2013-07-26 – 2013-07-28 (×3): 20 mg via ORAL
  Filled 2013-07-25 (×3): qty 1

## 2013-07-25 NOTE — Progress Notes (Signed)
Patient ID: [name], female   DOB: 05-01-1923, 79 y.o.   MRN: [id number]  [facility] PROGRESS NOTE  Jarissa Sheriff Callender WUJ:[id number] DOB: 1922-08-27 DOA: 07/23/2013 PCP: [name], MD  Brief narrative:  79 y.o. female who presented to the ED with AMS. She has been getting more lethargic and not getting out of bed over the past few days. Per family she also has an extensive history of UTIs, delerium with UTI.   Principal Problem:  UTI (lower urinary tract infection)  - continue Rocephin day #3 - urine culture with gram neg rods but final report pending  - follow up on urine culture  Active Problems:  DIABETES MELLITUS, TYPE II  - continue SSI  - start home medical regimen as CBG's are in 300's  Atrial fibrillation  - rate controlled  - continue Coumadin per pharmacy  - continue Metoprolol  Hypokalemia  - secondary to Lasix - supplemented and within normal limits this AM  HLD - continue statin  Hypothyroidism  - continue Synthroid  Leukocytosis  - secondary to UTI  - now resolved  Acute renal failure - secondary to Lasix - will lower the dose form 40 mg to 20 mg PO QD - repeat BMP in AM  Consultants:  None Procedures/Studies:  None Antibiotics:  Rocephin 12/23 -->  Code Status: Full  Family Communication: Pt and son at bedside  Disposition Plan: Home when medically stable   HPI/Subjective: No events overnight.   Objective: Filed Vitals:   07/23/13 2330 07/24/13 0600 07/24/13 1500 07/25/13 0600  BP: 114/51 142/74 134/66 121/69  Pulse: 83 78 82 80  Temp: 98.3 F (36.8 C) 97.5 F (36.4 C) 98 F (36.7 C) 98.2 F (36.8 C)  TempSrc: Oral Oral Oral Oral  Resp: 20 20 20 20   Weight: 115.259 kg (254 lb 1.6 oz) 115.259 kg (254 lb 1.6 oz)    SpO2: 97% 97% 95% 95%    Intake/Output Summary (Last 24 hours) at 07/25/13 1151 Last data filed at 07/25/13 0848  Gross per 24 hour  Intake    240 ml  Output   2225 ml  Net  -1985 ml     Exam:   General:  Pt is alert, follows commands appropriately, not in acute distress  Cardiovascular: Regular rate and rhythm, S1/S2, no murmurs, no rubs, no gallops  Respiratory: Clear to auscultation bilaterally, no wheezing, no crackles, no rhonchi  Abdomen: Soft, non tender, non distended, bowel sounds present, no guarding  Extremities: No edema, pulses DP and PT palpable bilaterally  Neuro: Grossly nonfocal  Data Reviewed: Basic Metabolic Panel:  Recent Labs Lab 07/23/13 2150 07/24/13 0540 07/25/13 0505  NA 140 138 135  K 3.6 3.4* 3.8  CL 104 103 99  CO2 22 26 25   GLUCOSE 63* 123* 345*  BUN 17 16 17   CREATININE 1.02 1.08 1.19*  CALCIUM 10.0 9.0 8.8   Liver Function Tests:  Recent Labs Lab 07/23/13 2150 07/25/13 0505  AST 74* 25  ALT 56* 33  ALKPHOS 125* 121*  BILITOT 2.7* 0.7  PROT 7.7 6.6  ALBUMIN 3.4* 2.8*   CBC:  Recent Labs Lab 07/23/13 2150 07/24/13 0540 07/25/13 0505  WBC 13.1* 8.5 7.1  NEUTROABS 10.1*  --   --   HGB 11.5* 10.4* 10.1*  HCT 35.4* 32.8* 32.1*  MCV 94.9 96.5 97.0  PLT 319 267 222   CBG:  Recent Labs Lab 07/24/13 0059 07/24/13 0451 07/24/13 0733 07/24/13 1139 07/24/13 1652  GLUCAP  84 127* 122* 156* 306*    Recent Results (from the past 240 hour(s))  CULTURE, BLOOD (ROUTINE X 2)     Status: None   Collection Time    07/23/13  9:45 PM      Result Value Range Status   Specimen Description BLOOD RIGHT HAND   Final   Special Requests BOTTLES DRAWN AEROBIC ONLY   Final   Culture  Setup Time     Final   Value: 07/24/2013 00:52     Performed at [facility]   Culture     Final   Value:        BLOOD CULTURE RECEIVED NO GROWTH TO DATE CULTURE WILL BE HELD FOR 5 DAYS BEFORE ISSUING A FINAL NEGATIVE REPORT     Performed at [facility]   Report Status PENDING   Incomplete  CULTURE, BLOOD (ROUTINE X 2)     Status: None   Collection Time    07/23/13  9:50 PM      Result Value Range Status    Specimen Description BLOOD RIGHT WRIST   Final   Special Requests BOTTLES DRAWN AEROBIC ONLY   Final   Culture  Setup Time     Final   Value: 07/24/2013 00:52     Performed at [facility]   Culture     Final   Value:        BLOOD CULTURE RECEIVED NO GROWTH TO DATE CULTURE WILL BE HELD FOR 5 DAYS BEFORE ISSUING A FINAL NEGATIVE REPORT     Performed at [facility]   Report Status PENDING   Incomplete  URINE CULTURE     Status: None   Collection Time    07/23/13  9:59 PM      Result Value Range Status   Specimen Description URINE, CATHETERIZED   Final   Special Requests NONE   Final   Culture  Setup Time     Final   Value: 07/24/2013 01:58     Performed at [facility] Count     Final   Value: >=100,000 COLONIES/ML     Performed at [facility]   Culture     Final   Value: GRAM NEGATIVE RODS     Performed at [facility]   Report Status PENDING   Incomplete     Scheduled Meds: . cefTRIAXone (ROCEPHIN) IVPB 1 gram/50 mL D5W  1 g Intravenous Q24H  . feeding supplement (GLUCERNA SHAKE)  237 mL Oral Q24H  . furosemide  40 mg Oral Daily  . hydrocortisone  25 mg Rectal Q12H  . hydrocortisone-pramoxine  1 applicator Rectal BID  . insulin aspart  0-9 Units Subcutaneous TID WC  . levothyroxine  150 mcg Oral Daily  . metoprolol  25 mg Oral BID  . pantoprazole  40 mg Oral Daily  . potassium chloride SA  20 mEq Oral Daily  . simvastatin  40 mg Oral QHS  . Warfarin - Pharmacist Dosing Inpatient   Does not apply q1800   Continuous Infusions:   Debbora Presto, MD  [facility] Pager [phone]  If 7PM-7AM, please contact night-coverage [email or web address] Password [facility] 07/25/2013, 11:51 AM   LOS: 2 days

## 2013-07-25 NOTE — Progress Notes (Signed)
ANTICOAGULATION CONSULT NOTE - Follow Up Consult  Pharmacy Consult for Warfarin Indication: atrial fibrillation  Allergies  Allergen Reactions  . Ciprofloxacin     Nausea Possibly rash and itching- but not sure   . Gabapentin Nausea And Vomiting  . Insulin Glargine     Itching   . Latex     Rash   . Metformin And Related     Severe diarrhea   . Metoclopramide Hcl     REACTION: unspecified  . Nsaids     REACTION: unspecified  . Promethazine Hcl     REACTION: unspecified    Patient Measurements: Weight: 254 lb 1.6 oz (115.259 kg)  Vital Signs: Temp: 98.2 F (36.8 C) (12/25 0600) Temp src: Oral (12/25 0600) BP: 121/69 mmHg (12/25 0600) Pulse Rate: 80 (12/25 0600)  Labs:  Recent Labs  07/23/13 2150 07/24/13 0540 07/25/13 0505  HGB 11.5* 10.4* 10.1*  HCT 35.4* 32.8* 32.1*  PLT 319 267 222  LABPROT  --  25.4* 24.5*  INR  --  2.40* 2.29*  CREATININE 1.02 1.08 1.19*    The CrCl is unknown because both a height and weight (above a minimum accepted value) are required for this calculation.   Medications:  Scheduled:  . cefTRIAXone (ROCEPHIN) IVPB 1 gram/50 mL D5W  1 g Intravenous Q24H  . feeding supplement (GLUCERNA SHAKE)  237 mL Oral Q24H  . [START ON 07/26/2013] furosemide  20 mg Oral Daily  . hydrocortisone  25 mg Rectal Q12H  . hydrocortisone-pramoxine  1 applicator Rectal BID  . insulin aspart  0-9 Units Subcutaneous TID WC  . insulin aspart protamine- aspart  30 Units Subcutaneous BID WC  . levothyroxine  150 mcg Oral Daily  . metoprolol  25 mg Oral BID  . pantoprazole  40 mg Oral Daily  . potassium chloride SA  20 mEq Oral Daily  . simvastatin  40 mg Oral QHS  . Warfarin - Pharmacist Dosing Inpatient   Does not apply q1800   Infusions:    Assessment: 79 yo F on chronic warfarin therapy for atrial fibrillation, admitted with altered mental status and UTI. Patient is followed by [facility]; Home warfarin dosage reported as 2.5  mg daily except 5 mg on Mondays.  INR therapeutic today  CBC low but stable, no bleeding/complications reported.  Goal of Therapy:  INR 2-3   Plan:   Warfarin 2.5 mg PO x 1 today as per patient's usual regimen.  PT/INR daily while inpatient.  Loralee Pacas, PharmD, BCPS Pager: [phone] 07/25/2013,11:57 AM

## 2013-07-26 LAB — BASIC METABOLIC PANEL
BUN: 16 mg/dL (ref 6–23)
Calcium: 8.9 mg/dL (ref 8.4–10.5)
Chloride: 101 mEq/L (ref 96–112)
Creatinine, Ser: 1.09 mg/dL (ref 0.50–1.10)
GFR calc Af Amer: 51 mL/min — ABNORMAL LOW (ref >90)
GFR calc non Af Amer: 44 mL/min — ABNORMAL LOW (ref >90)
Glucose, Bld: 258 mg/dL — ABNORMAL HIGH (ref 70–99)

## 2013-07-26 LAB — CBC
HCT: 33.6 % — ABNORMAL LOW (ref 36.0–46.0)
MCH: 30.3 pg (ref 26.0–34.0)
MCHC: 31.3 g/dL (ref 30.0–36.0)
MCV: 97.1 fL (ref 78.0–100.0)
Platelets: 226 10*3/uL (ref 150–400)
RBC: 3.46 MIL/uL — ABNORMAL LOW (ref 3.87–5.11)
RDW: 16.4 % — ABNORMAL HIGH (ref 11.5–15.5)

## 2013-07-26 LAB — GLUCOSE, CAPILLARY
Glucose-Capillary: 210 mg/dL — ABNORMAL HIGH (ref 70–99)
Glucose-Capillary: 302 mg/dL — ABNORMAL HIGH (ref 70–99)

## 2013-07-26 MED ORDER — WARFARIN SODIUM 4 MG PO TABS
4.0000 mg | ORAL_TABLET | Freq: Once | ORAL | Status: AC
  Administered 2013-07-26: 4 mg via ORAL
  Filled 2013-07-26: qty 1

## 2013-07-26 NOTE — Progress Notes (Signed)
Clinical Social Work Department BRIEF PSYCHOSOCIAL ASSESSMENT 07/26/2013  Patient:  [name], [name]     Account Number:  [id number]     Admit date:  07/23/2013  Clinical Social Worker:  Dennison Bulla  Date/Time:  07/26/2013 01:00 PM  Referred by:  Physician  Date Referred:  07/26/2013 Referred for  SNF Placement   Other Referral:   Interview type:  Patient Other interview type:    PSYCHOSOCIAL DATA Living Status:  FAMILY Admitted from facility:   Level of care:   Primary support name:  Junior Primary support relationship to patient:  SPOUSE Degree of support available:   Adequate    CURRENT CONCERNS Current Concerns  Post-Acute Placement   Other Concerns:    SOCIAL WORK ASSESSMENT / PLAN CSW received referral to assist with DC planning. CSW reviewed chart and met with patient at bedside. CSW introduced myself and explained role.    Patient reports that she lives at home with husband. Patient reports that husband assists as needed and that she prefers to return home at DC. Patient reports that she uses a walker and wheelchair at home but that she can manage returning home. CSW offered SNF placement and explained rehab but patient refuses placement. Patient reports no safety concerns with returning home and reports family is agreeable to plans.    CSW is signing off but available if needed.   Assessment/plan status:  No Further Intervention Required Other assessment/ plan:   Information/referral to community resources:   SNF information    PATIENT'S/FAMILY'S RESPONSE TO PLAN OF CARE: Patient alert and oriented. Patient engaged in assessment but refused placement at this time. Patient thanked CSW for time but reports no needs.       Unk Lightning, LCSW (Coverage for [employer])

## 2013-07-26 NOTE — Progress Notes (Signed)
ANTICOAGULATION CONSULT NOTE - Follow Up Consult  Pharmacy Consult for Warfarin Indication: atrial fibrillation  Allergies  Allergen Reactions  . Ciprofloxacin     Nausea Possibly rash and itching- but not sure   . Gabapentin Nausea And Vomiting  . Insulin Glargine     Itching   . Latex     Rash   . Metformin And Related     Severe diarrhea   . Metoclopramide Hcl     REACTION: unspecified  . Nsaids     REACTION: unspecified  . Promethazine Hcl     REACTION: unspecified    Patient Measurements: Weight: 254 lb 1.6 oz (115.259 kg)  Vital Signs: Temp: 98.2 F (36.8 C) (12/26 0659) Temp src: Oral (12/26 0659) BP: 142/69 mmHg (12/26 0659) Pulse Rate: 71 (12/26 0659)  Labs:  Recent Labs  07/24/13 0540 07/25/13 0505 07/26/13 0535  HGB 10.4* 10.1* 10.5*  HCT 32.8* 32.1* 33.6*  PLT 267 222 226  LABPROT 25.4* 24.5* 19.9*  INR 2.40* 2.29* 1.75*  CREATININE 1.08 1.19* 1.09    The CrCl is unknown because both a height and weight (above a minimum accepted value) are required for this calculation.   Medications:  Scheduled:  . cefTRIAXone (ROCEPHIN) IVPB 1 gram/50 mL D5W  1 g Intravenous Q24H  . feeding supplement (GLUCERNA SHAKE)  237 mL Oral Q24H  . furosemide  20 mg Oral Daily  . hydrocortisone  25 mg Rectal Q12H  . hydrocortisone-pramoxine  1 applicator Rectal BID  . insulin aspart  0-9 Units Subcutaneous TID WC  . insulin aspart protamine- aspart  30 Units Subcutaneous BID WC  . levothyroxine  150 mcg Oral Daily  . metoprolol  25 mg Oral BID  . pantoprazole  40 mg Oral Daily  . potassium chloride SA  20 mEq Oral Daily  . simvastatin  40 mg Oral QHS  . Warfarin - Pharmacist Dosing Inpatient   Does not apply q1800   Infusions:    Assessment: 79 yo F on chronic warfarin therapy for atrial fibrillation, admitted with altered mental status and UTI. Patient is followed by [facility]; Home warfarin dosage reported as 2.5 mg daily except 5 mg  on Mondays.  INR subtherapeutic 1.75 today.  Inpatient warfarin doses administered 12/24 - 12/25:  2.5 mg, 2.5mg .  Hgb/Hct and Pltc stable.  No bleeding reported in chart.  Goal of Therapy:  INR 2-3   Plan:   Boost with warfarin 4 mg PO x1 today only.  PT/INR daily while inpatient.  Elie Goody, PharmD, BCPS Pager: [phone] 07/26/2013  8:22 AM

## 2013-07-26 NOTE — Progress Notes (Signed)
Physical Therapy Treatment Patient Details Name: [name] MRN: [id number] DOB: 26-Jan-1923 Today's Date: 07/26/2013 Time: 4098-1191 PT Time Calculation (min): 26 min  PT Assessment / Plan / Recommendation  History of Present Illness 79 y.o. female who presented to the ED with AMS. She has been getting more lethargic and not getting out of bed over the past few days. Per family she also has an extensive history of UTIs, delerium with UTI.   PT Comments   Pt AxO x 3 and wide awake.  Assisted pt OOB to amb to BR.  Pt was able to get herself up OOB but was unable to stand upright.  Pt required <25% VC's for safety with turns and increased time to amb from bed to BR 12 feet nearly took 3 min.  Assisted with hygiene as pt was unable to reach area.  Amb from BR to recliner.  HIGH FALL RISK due to poor posture and pt tends to let go of her walker and reach out to steady self on furniture.    Follow Up Recommendations  Home health PT (family declines SNF)     Does the patient have the potential to tolerate intense rehabilitation     Barriers to Discharge        Equipment Recommendations       Recommendations for Other Services    Frequency Min 3X/week   Progress towards PT Goals Progress towards PT goals: Progressing toward goals  Plan      Precautions / Restrictions Precautions Precautions: Fall Precaution Comments: Pt unable to stand/amb upright    Pertinent Vitals/Pain No c/o pain    Mobility  Bed Mobility Bed Mobility: Supine to Sit Supine to Sit: HOB elevated;4: Min assist Details for Bed Mobility Assistance: assist with trunk plus increased time Transfers Transfers: Sit to Stand;Stand to Sit Sit to Stand: 5: Supervision;4: Min guard;From bed;From toilet Stand to Sit: 5: Supervision;4: Min guard;To toilet;To chair/3-in-1 Details for Transfer Assistance: pt pulls up on walker at her baseline and leans way far forward to shift her body weight  forward Ambulation/Gait Ambulation/Gait Assistance: 5: Supervision;4: Min guard Ambulation Distance (Feet): 24 Feet (12 feet x 2) Assistive device: Rolling walker Ambulation/Gait Assistance Details: increased time with wide BOS and poor forward flex posture nearly 65 degrees.  Gait Pattern: Step-to pattern;Trunk flexed Gait velocity: decreased     PT Goals (current goals can now be found in the care plan section)    Visit Information  Last PT Received On: 07/26/13 Assistance Needed: +1 History of Present Illness: 78 y.o. female who presented to the ED with AMS. She has been getting more lethargic and not getting out of bed over the past few days. Per family she also has an extensive history of UTIs, delerium with UTI.    Subjective Data      Cognition       Balance     End of Session PT - End of Session Equipment Utilized During Treatment: Gait belt Activity Tolerance: Patient tolerated treatment well Patient left: in chair;with call bell/phone within reach   Felecia Shelling  PTA [facility]  Acute  Rehab Pager      [phone]

## 2013-07-26 NOTE — Progress Notes (Signed)
Patient ID: [name], female   DOB: 07/02/1923, 79 y.o.   MRN: [id number]  [facility] PROGRESS NOTE  [name] WUJ:[id number] DOB: 12-25-1922 DOA: 07/23/2013 PCP: [name], MD  Brief narrative:  79 y.o. female who presented to the ED with AMS. She has been getting more lethargic and not getting out of bed over the past few days. Per family she also has an extensive history of UTIs, delerium with UTI.   Principal Problem:  UTI (lower urinary tract infection)  - continue Rocephin day #4 - urine culture with E. Coli sensitive to Rocephin and Nitrofurantoin  - will continue Rocephin today and transition to oral ABX in AM Active Problems:  DIABETES MELLITUS, TYPE II  - continue SSI  - start home medical regimen as CBG's are in 300's  Atrial fibrillation  - rate controlled  - continue Coumadin per pharmacy  - continue Metoprolol  Hypokalemia  - secondary to Lasix  - supplemented and within normal limits this AM  HLD - continue statin  Hypothyroidism  - continue Synthroid  Leukocytosis  - secondary to UTI  - now resolved  Acute renal failure  - secondary to Lasix  - lowered the dose form 40 mg to 20 mg PO QD  - Cr is WNL this AM  Consultants:  None Procedures/Studies:  None Antibiotics:  Rocephin 12/23 -->  Code Status: Full  Family Communication: Pt and son at bedside  Disposition Plan: Home when medically stable   HPI/Subjective: No events overnight.   Objective: Filed Vitals:   07/24/13 1500 07/25/13 0600 07/25/13 1517 07/26/13 0659  BP: 134/66 121/69 145/86 142/69  Pulse: 82 80 84 71  Temp: 98 F (36.7 C) 98.2 F (36.8 C) 98.7 F (37.1 C) 98.2 F (36.8 C)  TempSrc: Oral Oral Oral Oral  Resp: 20 20 18 20   Weight:      SpO2: 95% 95% 98% 96%    Intake/Output Summary (Last 24 hours) at 07/26/13 0843 Last data filed at 07/26/13 0640  Gross per 24 hour  Intake    360 ml  Output   1450 ml  Net  -1090 ml    Exam:   General:   Pt is alert, follows commands appropriately, not in acute distress  Cardiovascular: Regular rate and rhythm, S1/S2, no murmurs, no rubs, no gallops  Respiratory: Clear to auscultation bilaterally, no wheezing, no crackles, no rhonchi  Abdomen: Soft, non tender, non distended, bowel sounds present, no guarding  Extremities: No edema, pulses DP and PT palpable bilaterally  Neuro: Grossly nonfocal  Data Reviewed: Basic Metabolic Panel:  Recent Labs Lab 07/23/13 2150 07/24/13 0540 07/25/13 0505 07/26/13 0535  NA 140 138 135 134*  K 3.6 3.4* 3.8 3.6  CL 104 103 99 101  CO2 22 26 25 23   GLUCOSE 63* 123* 345* 258*  BUN 17 16 17 16   CREATININE 1.02 1.08 1.19* 1.09  CALCIUM 10.0 9.0 8.8 8.9   Liver Function Tests:  Recent Labs Lab 07/23/13 2150 07/25/13 0505  AST 74* 25  ALT 56* 33  ALKPHOS 125* 121*  BILITOT 2.7* 0.7  PROT 7.7 6.6  ALBUMIN 3.4* 2.8*  CBC:  Recent Labs Lab 07/23/13 2150 07/24/13 0540 07/25/13 0505 07/26/13 0535  WBC 13.1* 8.5 7.1 7.6  NEUTROABS 10.1*  --   --   --   HGB 11.5* 10.4* 10.1* 10.5*  HCT 35.4* 32.8* 32.1* 33.6*  MCV 94.9 96.5 97.0 97.1  PLT 319 267 222 226  CBG:  Recent Labs Lab 07/25/13 0734 07/25/13 1202 07/25/13 1643 07/25/13 2121 07/26/13 0742  GLUCAP 316* 305* 359* 299* 210*    Recent Results (from the past 240 hour(s))  CULTURE, BLOOD (ROUTINE X 2)     Status: None   Collection Time    07/23/13  9:45 PM      Result Value Range Status   Specimen Description BLOOD RIGHT HAND   Final   Special Requests BOTTLES DRAWN AEROBIC ONLY   Final   Culture  Setup Time     Final   Value: 07/24/2013 00:52     Performed at [facility]   Culture     Final   Value:        BLOOD CULTURE RECEIVED NO GROWTH TO DATE CULTURE WILL BE HELD FOR 5 DAYS BEFORE ISSUING A FINAL NEGATIVE REPORT     Performed at [facility]   Report Status PENDING   Incomplete  CULTURE, BLOOD (ROUTINE X 2)     Status: None    Collection Time    07/23/13  9:50 PM      Result Value Range Status   Specimen Description BLOOD RIGHT WRIST   Final   Special Requests BOTTLES DRAWN AEROBIC ONLY   Final   Culture  Setup Time     Final   Value: 07/24/2013 00:52     Performed at [facility]   Culture     Final   Value:        BLOOD CULTURE RECEIVED NO GROWTH TO DATE CULTURE WILL BE HELD FOR 5 DAYS BEFORE ISSUING A FINAL NEGATIVE REPORT     Performed at [facility]   Report Status PENDING   Incomplete  URINE CULTURE     Status: None   Collection Time    07/23/13  9:59 PM      Result Value Range Status   Specimen Description URINE, CATHETERIZED   Final   Special Requests NONE   Final   Culture  Setup Time     Final   Value: 07/24/2013 01:58     Performed at [facility] Count     Final   Value: >=100,000 COLONIES/ML     Performed at [facility]   Culture     Final   Value: CITROBACTER KOSERI     Performed at [facility]   Report Status 07/25/2013 FINAL   Final   Organism ID, Bacteria CITROBACTER KOSERI   Final     Scheduled Meds: . cefTRIAXone (ROCEPHIN) IVPB 1 gram/50 mL D5W  1 g Intravenous Q24H  . feeding supplement (GLUCERNA SHAKE)  237 mL Oral Q24H  . furosemide  20 mg Oral Daily  . hydrocortisone  25 mg Rectal Q12H  . hydrocortisone-pramoxine  1 applicator Rectal BID  . insulin aspart  0-9 Units Subcutaneous TID WC  . insulin aspart protamine- aspart  30 Units Subcutaneous BID WC  . levothyroxine  150 mcg Oral Daily  . metoprolol  25 mg Oral BID  . pantoprazole  40 mg Oral Daily  . potassium chloride SA  20 mEq Oral Daily  . simvastatin  40 mg Oral QHS  . warfarin  4 mg Oral ONCE-1800  . Warfarin - Pharmacist Dosing Inpatient   Does not apply q1800   Continuous Infusions:  Debbora Presto, MD  [facility] Pager [phone]  If 7PM-7AM, please contact night-coverage [email or web address] Password [facility] 07/26/2013, 8:43 AM  LOS: 3 days

## 2013-07-27 LAB — BASIC METABOLIC PANEL
BUN: 14 mg/dL (ref 6–23)
Chloride: 99 mEq/L (ref 96–112)
Creatinine, Ser: 1.1 mg/dL (ref 0.50–1.10)
GFR calc Af Amer: 50 mL/min — ABNORMAL LOW (ref >90)
GFR calc non Af Amer: 44 mL/min — ABNORMAL LOW (ref >90)
Glucose, Bld: 196 mg/dL — ABNORMAL HIGH (ref 70–99)

## 2013-07-27 LAB — GLUCOSE, CAPILLARY
Glucose-Capillary: 216 mg/dL — ABNORMAL HIGH (ref 70–99)
Glucose-Capillary: 267 mg/dL — ABNORMAL HIGH (ref 70–99)

## 2013-07-27 LAB — CBC
HCT: 35.6 % — ABNORMAL LOW (ref 36.0–46.0)
MCHC: 31.5 g/dL (ref 30.0–36.0)
MCV: 96.5 fL (ref 78.0–100.0)
RDW: 16.4 % — ABNORMAL HIGH (ref 11.5–15.5)

## 2013-07-27 MED ORDER — CEFUROXIME AXETIL 250 MG PO TABS
250.0000 mg | ORAL_TABLET | Freq: Two times a day (BID) | ORAL | Status: DC
  Administered 2013-07-27 – 2013-07-28 (×2): 250 mg via ORAL
  Filled 2013-07-27 (×4): qty 1

## 2013-07-27 MED ORDER — WARFARIN SODIUM 4 MG PO TABS
4.0000 mg | ORAL_TABLET | Freq: Once | ORAL | Status: AC
  Administered 2013-07-27: 4 mg via ORAL
  Filled 2013-07-27: qty 1

## 2013-07-27 MED ORDER — NITROFURANTOIN MACROCRYSTAL 100 MG PO CAPS: 100.0000 mg | ORAL_CAPSULE | Freq: Two times a day (BID) | ORAL | Status: DC

## 2013-07-27 NOTE — Progress Notes (Signed)
ANTICOAGULATION CONSULT NOTE - Follow Up Consult  Pharmacy Consult for Warfarin Indication: atrial fibrillation  Allergies  Allergen Reactions  . Ciprofloxacin     Nausea Possibly rash and itching- but not sure   . Gabapentin Nausea And Vomiting  . Insulin Glargine     Itching   . Latex     Rash   . Metformin And Related     Severe diarrhea   . Metoclopramide Hcl     REACTION: unspecified  . Nsaids     REACTION: unspecified  . Promethazine Hcl     REACTION: unspecified    Patient Measurements: Weight: 254 lb 1.6 oz (115.259 kg)  Vital Signs: Temp: 97.9 F (36.6 C) (12/27 0500) Temp src: Oral (12/27 0500) BP: 135/64 mmHg (12/27 0500) Pulse Rate: 84 (12/27 0500)  Labs:  Recent Labs  07/25/13 0505 07/26/13 0535 07/27/13 0538  HGB 10.1* 10.5* 11.2*  HCT 32.1* 33.6* 35.6*  PLT 222 226 238  LABPROT 24.5* 19.9* 18.9*  INR 2.29* 1.75* 1.63*  CREATININE 1.19* 1.09 1.10    The CrCl is unknown because both a height and weight (above a minimum accepted value) are required for this calculation.   Medications:  Scheduled:  . cefTRIAXone (ROCEPHIN) IVPB 1 gram/50 mL D5W  1 g Intravenous Q24H  . feeding supplement (GLUCERNA SHAKE)  237 mL Oral Q24H  . furosemide  20 mg Oral Daily  . hydrocortisone  25 mg Rectal Q12H  . hydrocortisone-pramoxine  1 applicator Rectal BID  . insulin aspart  0-9 Units Subcutaneous TID WC  . insulin aspart protamine- aspart  30 Units Subcutaneous BID WC  . levothyroxine  150 mcg Oral Daily  . metoprolol  25 mg Oral BID  . pantoprazole  40 mg Oral Daily  . potassium chloride SA  20 mEq Oral Daily  . simvastatin  40 mg Oral QHS  . Warfarin - Pharmacist Dosing Inpatient   Does not apply q1800   Infusions:    Assessment: 79 yo F on chronic warfarin therapy for atrial fibrillation, admitted with altered mental status and UTI. Patient is followed by [facility]; Home warfarin dosage reported as 2.5 mg daily except 5 mg  on Mondays.  INR subtherapeutic 1.63 and falling.  Inpatient warfarin doses administered 12/24 - 12/26:  2.5 mg, 2.5mg , 4mg   Hgb/Hct and Pltc stable.  No bleeding reported in chart.  Goal of Therapy:  INR 2-3   Plan:   Repeat boosted warfarin 4 mg PO x1 today   PT/INR daily while inpatient.  Loralee Pacas, PharmD, BCPS Pager: [phone]  07/27/2013  8:38 AM

## 2013-07-27 NOTE — Progress Notes (Signed)
Patient ID: [name], female   DOB: 1922/11/28, 79 y.o.   MRN: [id number]  [facility] PROGRESS NOTE  [name] WUJ:[id number] DOB: January 10, 1923 DOA: 07/23/2013 PCP: [name], MD  Brief narrative:  80 y.o. female who presented to the ED with AMS. She has been getting more lethargic and not getting out of bed over the past few days. Per family she also has an extensive history of UTIs, delerium with UTI.   Principal Problem:  UTI (lower urinary tract infection)  - continue Rocephin day #5 - urine culture with citrobacter present sensitive to Rocephin and Nitrofurantoin  - will transition to oral ABX today  Active Problems:  DIABETES MELLITUS, TYPE II  - continue SSI  - start home medical regimen as CBG's are in 300's  Atrial fibrillation  - rate controlled  - continue Coumadin per pharmacy  - continue Metoprolol  Hypokalemia  - secondary to Lasix  - supplemented and within normal limits this AM  HLD - continue statin  Hypothyroidism  - continue Synthroid  Leukocytosis  - secondary to UTI  - now resolved as WBC is WNL Acute renal failure  - secondary to Lasix  - lowered the dose form 40 mg to 20 mg PO QD  - Cr is WNL this AM   Consultants:  None Procedures/Studies:  None Antibiotics:  Rocephin 12/23 --> 12/27 Nitrofurantoin 12/27 -->  Code Status: Full  Family Communication: Pt and son at bedside  Disposition Plan: Home in AM   HPI/Subjective: No events overnight.   Objective: Filed Vitals:   07/26/13 0659 07/26/13 1618 07/26/13 2054 07/27/13 0500  BP: 142/69 129/78 134/79 135/64  Pulse: 71 77 78 84  Temp: 98.2 F (36.8 C) 97.4 F (36.3 C) 97.8 F (36.6 C) 97.9 F (36.6 C)  TempSrc: Oral Oral Oral Oral  Resp: 20 18 18 20   Weight:      SpO2: 96% 97% 96% 97%    Intake/Output Summary (Last 24 hours) at 07/27/13 1314 Last data filed at 07/27/13 1043  Gross per 24 hour  Intake    690 ml  Output   3150 ml  Net  -2460 ml     Exam:   General:  Pt is alert, follows commands appropriately, not in acute distress  Cardiovascular: Regular rate and rhythm, S1/S2, no murmurs, no rubs, no gallops  Respiratory: Clear to auscultation bilaterally, no wheezing, no crackles, no rhonchi  Abdomen: Soft, non tender, non distended, bowel sounds present, no guarding  Extremities: No edema, pulses DP and PT palpable bilaterally  Neuro: Grossly nonfocal  Data Reviewed: Basic Metabolic Panel:  Recent Labs Lab 07/23/13 2150 07/24/13 0540 07/25/13 0505 07/26/13 0535 07/27/13 0538  NA 140 138 135 134* 137  K 3.6 3.4* 3.8 3.6 3.5  CL 104 103 99 101 99  CO2 22 26 25 23 28   GLUCOSE 63* 123* 345* 258* 196*  BUN 17 16 17 16 14   CREATININE 1.02 1.08 1.19* 1.09 1.10  CALCIUM 10.0 9.0 8.8 8.9 9.1   Liver Function Tests:  Recent Labs Lab 07/23/13 2150 07/25/13 0505  AST 74* 25  ALT 56* 33  ALKPHOS 125* 121*  BILITOT 2.7* 0.7  PROT 7.7 6.6  ALBUMIN 3.4* 2.8*   CBC:  Recent Labs Lab 07/23/13 2150 07/24/13 0540 07/25/13 0505 07/26/13 0535 07/27/13 0538  WBC 13.1* 8.5 7.1 7.6 8.3  NEUTROABS 10.1*  --   --   --   --   HGB 11.5* 10.4* 10.1*  10.5* 11.2*  HCT 35.4* 32.8* 32.1* 33.6* 35.6*  MCV 94.9 96.5 97.0 97.1 96.5  PLT 319 267 222 226 238   CBG:  Recent Labs Lab 07/26/13 1133 07/26/13 1653 07/26/13 2144 07/27/13 0650 07/27/13 1119  GLUCAP 302* 301* 246* 190* 168*    Recent Results (from the past 240 hour(s))  CULTURE, BLOOD (ROUTINE X 2)     Status: None   Collection Time    07/23/13  9:45 PM      Result Value Range Status   Specimen Description BLOOD RIGHT HAND   Final   Special Requests BOTTLES DRAWN AEROBIC ONLY   Final   Culture  Setup Time     Final   Value: 07/24/2013 00:52     Performed at [facility]   Culture     Final   Value:        BLOOD CULTURE RECEIVED NO GROWTH TO DATE CULTURE WILL BE HELD FOR 5 DAYS BEFORE ISSUING A FINAL NEGATIVE REPORT     Performed at  [facility]   Report Status PENDING   Incomplete  CULTURE, BLOOD (ROUTINE X 2)     Status: None   Collection Time    07/23/13  9:50 PM      Result Value Range Status   Specimen Description BLOOD RIGHT WRIST   Final   Special Requests BOTTLES DRAWN AEROBIC ONLY   Final   Culture  Setup Time     Final   Value: 07/24/2013 00:52     Performed at [facility]   Culture     Final   Value:        BLOOD CULTURE RECEIVED NO GROWTH TO DATE CULTURE WILL BE HELD FOR 5 DAYS BEFORE ISSUING A FINAL NEGATIVE REPORT     Performed at [facility]   Report Status PENDING   Incomplete  URINE CULTURE     Status: None   Collection Time    07/23/13  9:59 PM      Result Value Range Status   Specimen Description URINE, CATHETERIZED   Final   Special Requests NONE   Final   Culture  Setup Time     Final   Value: 07/24/2013 01:58     Performed at [facility] Count     Final   Value: >=100,000 COLONIES/ML     Performed at [facility]   Culture     Final   Value: CITROBACTER KOSERI     Performed at [facility]   Report Status 07/25/2013 FINAL   Final   Organism ID, Bacteria CITROBACTER KOSERI   Final     Scheduled Meds: . cefTRIAXone (ROCEPHIN) IVPB 1 gram/50 mL D5W  1 g Intravenous Q24H  . feeding supplement (GLUCERNA SHAKE)  237 mL Oral Q24H  . furosemide  20 mg Oral Daily  . hydrocortisone  25 mg Rectal Q12H  . hydrocortisone-pramoxine  1 applicator Rectal BID  . insulin aspart  0-9 Units Subcutaneous TID WC  . insulin aspart protamine- aspart  30 Units Subcutaneous BID WC  . levothyroxine  150 mcg Oral Daily  . metoprolol  25 mg Oral BID  . pantoprazole  40 mg Oral Daily  . potassium chloride SA  20 mEq Oral Daily  . simvastatin  40 mg Oral QHS  . Warfarin - Pharmacist Dosing Inpatient   Does not apply q1800   Continuous Infusions:   MAGICK-MYERS, ISKRA,  MD  [facility] Pager [phone]  If 7PM-7AM, please contact  night-coverage [email or web address] Password [email or web address] 07/27/2013, 1:14 PM   LOS: 4 days

## 2013-07-28 LAB — BASIC METABOLIC PANEL
BUN: 18 mg/dL (ref 6–23)
Calcium: 9.4 mg/dL (ref 8.4–10.5)
Creatinine, Ser: 1.14 mg/dL — ABNORMAL HIGH (ref 0.50–1.10)
GFR calc Af Amer: 48 mL/min — ABNORMAL LOW (ref >90)
GFR calc non Af Amer: 42 mL/min — ABNORMAL LOW (ref >90)
Glucose, Bld: 324 mg/dL — ABNORMAL HIGH (ref 70–99)
Potassium: 4.6 mEq/L (ref 3.5–5.1)
Sodium: 136 mEq/L (ref 135–145)

## 2013-07-28 LAB — CBC
Hemoglobin: 11.8 g/dL — ABNORMAL LOW (ref 12.0–15.0)
MCHC: 31.8 g/dL (ref 30.0–36.0)
MCV: 96.1 fL (ref 78.0–100.0)
Platelets: 263 10*3/uL (ref 150–400)
RBC: 3.86 MIL/uL — ABNORMAL LOW (ref 3.87–5.11)
RDW: 16.3 % — ABNORMAL HIGH (ref 11.5–15.5)

## 2013-07-28 LAB — GLUCOSE, CAPILLARY: Glucose-Capillary: 292 mg/dL — ABNORMAL HIGH (ref 70–99)

## 2013-07-28 LAB — GLUCOSE, RANDOM: Glucose, Bld: 452 mg/dL — ABNORMAL HIGH (ref 70–99)

## 2013-07-28 LAB — PROTIME-INR
INR: 1.69 — ABNORMAL HIGH (ref 0.00–1.49)
Prothrombin Time: 19.4 seconds — ABNORMAL HIGH (ref 11.6–15.2)

## 2013-07-28 MED ORDER — CEFUROXIME AXETIL 250 MG PO TABS: 250.0000 mg | ORAL_TABLET | Freq: Two times a day (BID) | ORAL | Status: DC

## 2013-07-28 MED ORDER — WARFARIN SODIUM 5 MG PO TABS
5.0000 mg | ORAL_TABLET | Freq: Once | ORAL | Status: AC
  Administered 2013-07-28: 5 mg via ORAL
  Filled 2013-07-28: qty 1

## 2013-07-28 NOTE — Discharge Summary (Signed)
Physician Discharge Summary  [name] [facility] ZOX:[id number] DOB: [birth year] DOA: 07/23/2013  PCP: [name], MD  Admit date: 07/23/2013 Discharge date: 07/28/2013  Recommendations for Outpatient Follow-up:  1. Pt will need to follow up with PCP in 2-3 weeks post discharge 2. Please obtain BMP to evaluate electrolytes and kidney function 3. Please also check CBC to evaluate Hg and Hct levels 4. Please note the patient was discharged on cefuroxime to complete therapy for UTI  Discharge Diagnoses: UTI secondary to Citrobacter Principal Problem:   UTI (lower urinary tract infection) Active Problems:   DIABETES MELLITUS, TYPE II   Atrial fibrillation  Discharge Condition: Stable  Diet recommendation: Heart healthy diet discussed in details   Brief narrative:  79 y.o. female who presented to the ED with AMS. She has been getting more lethargic and not getting out of bed over the past few days. Per family she also has an extensive history of UTIs, delerium with UTI.  Principal Problem:  UTI (lower urinary tract infection)  - continue Rocephin day #5 and transitioned to cefuroxime to complete therapy for UTI - urine culture with citrobacter present sensitive to Rocephin and Nitrofurantoin  Active Problems:  DIABETES MELLITUS, TYPE II  - continue SSI inpatient - Upon discharge we'll continue home medical regimen Atrial fibrillation  - rate controlled  - continue Coumadin per pharmacy  - continue Metoprolol  Hypokalemia  - secondary to Lasix  - supplemented and within normal limits this AM  HLD - continue statin  Hypothyroidism  - continue Synthroid  Leukocytosis  - secondary to UTI  - now resolved as WBC is WNL  Acute renal failure  - secondary to Lasix  - lowered the dose form 40 mg to 20 mg PO QD  - Creatinine will have to be checked in an outpatient setting to ensure if stable and within normal  Consultants:  None Procedures/Studies:  None Antibiotics:   Rocephin 12/23 --> 12/27  Cefuroxime upon discharge  Code Status: Full  Family Communication: Pt and son at bedside    Discharge Exam: Filed Vitals:   07/28/13 1402  BP: 124/64  Pulse: 93  Temp: 97.4 F (36.3 C)  Resp: 20   Filed Vitals:   07/27/13 2100 07/28/13 0500 07/28/13 0915 07/28/13 1402  BP: 136/72 134/75 134/75 124/64  Pulse: 95 80 80 93  Temp: 98 F (36.7 C) 97.7 F (36.5 C)  97.4 F (36.3 C)  TempSrc: Oral Oral  Oral  Resp: 18 20  20   Weight:      SpO2: 98% 98%  96%    General: Pt is alert, follows commands appropriately, not in acute distress Cardiovascular: Regular rate and rhythm, S1/S2 +, no murmurs, no rubs, no gallops Respiratory: Clear to auscultation bilaterally, no wheezing, no crackles, no rhonchi Abdominal: Soft, non tender, non distended, bowel sounds +, no guarding Extremities: no edema, no cyanosis, pulses palpable bilaterally DP and PT Neuro: Grossly nonfocal  Discharge Instructions  Discharge Orders   Future Appointments Provider Department Dept Phone   08/02/2013 2:45 PM [facility] [employer] [phone]   08/05/2013 2:30 PM [facility] [place] [phone]   08/21/2013 12:15 PM Carlus Pavlov, MD [facility] [place] [phone]   11/01/2013 2:30 PM York Spaniel, MD [facility] [phone]   Future Orders Complete By Expires   Diet - low sodium heart healthy  As directed    Increase activity slowly  As directed  Medication List         acetaminophen 500 MG tablet  Commonly known as:  TYLENOL  Take 500 mg by mouth every 6 (six) hours as needed for pain. For pain     BD PEN NEEDLE NANO U/F 32G X 4 MM Misc  Generic drug:  Insulin Pen Needle  USE AS DIRECTED WITH INSULIN PEN     cefUROXime 250 MG tablet  Commonly known as:  CEFTIN  Take 1 tablet (250 mg total) by mouth 2 (two) times daily with a meal.     furosemide 40  MG tablet  Commonly known as:  LASIX  Take 0.5 - 1 tablet by mouth every day     glucose blood test strip  Commonly known as:  ONE TOUCH ULTRA TEST  Check sugars 2x a day     hydrocortisone 25 MG suppository  Commonly known as:  ANUSOL-HC  Place 1 suppository (25 mg total) rectally every 12 (twelve) hours.     hydrocortisone-pramoxine rectal foam  Commonly known as:  PROCTOFOAM HC  Place 1 applicator rectally 2 (two) times daily.     insulin NPH-regular (70-30) 100 UNIT/ML injection  Commonly known as:  HUMULIN 70/30  Inject under skin 50 units 30 min before breakfast and dinner. Pens please.     Insulin Syringe-Needle U-100 31G X 15/64" 1 ML Misc  Commonly known as:  BD INSULIN SYRINGE ULTRAFINE  1 each by Does not apply route every morning. Use 2 to 3 times daily as directed.     levothyroxine 150 MCG tablet  Commonly known as:  SYNTHROID, LEVOTHROID  Take 1 tablet (150 mcg total) by mouth daily.     metoprolol 50 MG tablet  Commonly known as:  LOPRESSOR  Take 0.5 tablets (25 mg total) by mouth 2 (two) times daily.     omeprazole 20 MG capsule  Commonly known as:  PRILOSEC  Take 1 capsule (20 mg total) by mouth daily.     potassium chloride SA 20 MEQ tablet  Commonly known as:  K-DUR,KLOR-CON  Take 1 tablet by mouth  daily     simvastatin 40 MG tablet  Commonly known as:  ZOCOR  Take 1 tablet (40 mg total) by mouth at bedtime.     traMADol 50 MG tablet  Commonly known as:  ULTRAM  1-2 pills by mouth up to three times daily as needed for severe pain     warfarin 5 MG tablet  Commonly known as:  COUMADIN  Take 2.5-5 mg by mouth See admin instructions. Takes 5mg  on Monday & 2.5mg  on all other days     warfarin 5 MG tablet  Commonly known as:  COUMADIN  Take as directed by  Coumadin Clinic.           Follow-up Information   Follow up with [name], MD.   Specialties:  Family Medicine, Radiology   Contact information:   [address] [place] [address]  [place] [place] [address] [phone]       Follow up with Debbora Presto, MD. (As needed if symptoms worsen call my cell phone [phone])    Specialty:  Internal Medicine   Contact information:   [address] [place] [address] [phone]        The results of significant diagnostics from this hospitalization (including imaging, microbiology, ancillary and laboratory) are listed below for reference.     Microbiology: Recent Results (from the past 240 hour(s))  CULTURE, BLOOD (ROUTINE X 2)     Status: None   Collection Time    07/23/13  9:45 PM      Result Value Range Status   Specimen Description BLOOD RIGHT HAND   Final   Special Requests BOTTLES DRAWN AEROBIC ONLY   Final   Culture  Setup Time     Final   Value: 07/24/2013 00:52     Performed at [facility]   Culture     Final   Value:        BLOOD CULTURE RECEIVED NO GROWTH TO DATE CULTURE WILL BE HELD FOR 5 DAYS BEFORE ISSUING A FINAL NEGATIVE REPORT     Performed at [facility]   Report Status PENDING   Incomplete  CULTURE, BLOOD (ROUTINE X 2)     Status: None   Collection Time    07/23/13  9:50 PM      Result Value Range Status   Specimen Description BLOOD RIGHT WRIST   Final   Special Requests BOTTLES DRAWN AEROBIC ONLY   Final   Culture  Setup Time     Final   Value: 07/24/2013 00:52     Performed at [facility]   Culture     Final   Value:        BLOOD CULTURE RECEIVED NO GROWTH TO DATE CULTURE WILL BE HELD FOR 5 DAYS BEFORE ISSUING A FINAL NEGATIVE REPORT     Performed at [facility]   Report Status PENDING   Incomplete  URINE CULTURE     Status: None   Collection Time    07/23/13  9:59 PM      Result Value Range Status   Specimen Description URINE, CATHETERIZED   Final   Special Requests NONE   Final   Culture  Setup Time     Final   Value: 07/24/2013 01:58     Performed at [facility] Count     Final    Value: >=100,000 COLONIES/ML     Performed at [facility]   Culture     Final   Value: CITROBACTER KOSERI     Performed at [facility]   Report Status 07/25/2013 FINAL   Final   Organism ID, Bacteria CITROBACTER KOSERI   Final     Labs: Basic Metabolic Panel:  Recent Labs Lab 07/24/13 0540 07/25/13 0505 07/26/13 0535 07/27/13 0538 07/28/13 0610 07/28/13 1245  NA 138 135 134* 137 136  --   K 3.4* 3.8 3.6 3.5 4.6  --   CL 103 99 101 99 97  --   CO2 26 25 23 28 30   --   GLUCOSE 123* 345* 258* 196* 324* 452*  BUN 16 17 16 14 18   --   CREATININE 1.08 1.19* 1.09 1.10 1.14*  --   CALCIUM 9.0 8.8 8.9 9.1 9.4  --    Liver Function Tests:  Recent Labs Lab 07/23/13 2150 07/25/13 0505  AST 74* 25  ALT 56* 33  ALKPHOS 125* 121*  BILITOT 2.7* 0.7  PROT 7.7 6.6  ALBUMIN 3.4* 2.8*   CBC:  Recent Labs Lab 07/23/13 2150 07/24/13 0540 07/25/13 0505 07/26/13 0535 07/27/13 0538 07/28/13 0610  WBC 13.1* 8.5 7.1 7.6 8.3 10.1  NEUTROABS 10.1*  --   --   --   --   --   HGB 11.5* 10.4* 10.1* 10.5* 11.2* 11.8*  HCT 35.4* 32.8* 32.1*  33.6* 35.6* 37.1  MCV 94.9 96.5 97.0 97.1 96.5 96.1  PLT 319 267 222 226 238 263   BNP: BNP (last 3 results)  Recent Labs  03/07/13 1820  PROBNP 7836.0*   CBG:  Recent Labs Lab 07/27/13 1119 07/27/13 1555 07/27/13 2111 07/28/13 0707 07/28/13 1121  GLUCAP 168* 216* 267* 292* 414*    SIGNED: Time coordinating discharge: Over 30 minutes  Debbora Presto, MD  [facility] 07/28/2013, 2:47 PM Pager [phone]  If 7PM-7AM, please contact night-coverage [email or web address] Password [email or web address]

## 2013-07-28 NOTE — Progress Notes (Signed)
Discharge instructions given to pt and her son. Questions answered. Pt states she lives with her husband.

## 2013-07-28 NOTE — Evaluation (Signed)
Occupational Therapy Evaluation Patient Details Name: [name] MRN: [id number] DOB: 1922-12-01 Today's Date: 07/28/2013 Time: 4098-1191 OT Time Calculation (min): 33 min  OT Assessment / Plan / Recommendation History of present illness 79 y.o. female who presented to the ED with AMS. She has been getting more lethargic and not getting out of bed over the past few days. Per family she also has an extensive history of UTIs, delerium with UTI.   Clinical Impression   Pt's mental status much improved from admission.  She presents with generalized weakness and moves very slowly, but this may be baseline.  Pt demonstrates difficulty reaching for LB ADL and pericare.  Will follow to ADL training in preparation for return home with husband.    OT Assessment  Patient needs continued OT Services    Follow Up Recommendations  Home health OT;Supervision/Assistance - 24 hour    Barriers to Discharge      Equipment Recommendations  None recommended by OT    Recommendations for Other Services    Frequency  Min 2X/week    Precautions / Restrictions Precautions Precautions: Fall Precaution Comments: Pt unable to stand/amb upright Restrictions Weight Bearing Restrictions: No   Pertinent Vitals/Pain VSS, no pain    ADL  Eating/Feeding: Independent Where Assessed - Eating/Feeding: Chair Grooming: Wash/dry hands;Min guard Where Assessed - Grooming: Unsupported standing Upper Body Bathing: Min guard Where Assessed - Upper Body Bathing: Unsupported sitting Lower Body Bathing: Maximal assistance Where Assessed - Lower Body Bathing: Unsupported sitting;Supported sit to stand Upper Body Dressing: Set up Where Assessed - Upper Body Dressing: Unsupported sitting Lower Body Dressing: Maximal assistance Where Assessed - Lower Body Dressing: Unsupported sitting;Supported sit to stand Toilet Transfer: Minimal assistance Toilet Transfer Method: Sit to stand Toileting - Clothing Manipulation  and Hygiene: +1 Total assistance Where Assessed - Glass blower/designer Manipulation and Hygiene: Standing Equipment Used: Gait belt;Rolling walker Transfers/Ambulation Related to ADLs: min to mod assist, pt walks very slowly with flexed posture, assisted to guide walker, attempts to release walker and hold sink/furniture ADL Comments: Pt unable to donn socks from chair, typically is able to pull her leg up onto her bed at home and reach her feet.  Talked to pt about tub transfer bench, but pt not open to idea, states there are multiple grab bars around tub and stepping over wall of tub was not a problem PTA.  Pt required assist for pericare after toileting today (BM).  Foley still in place.    OT Diagnosis: Generalized weakness  OT Problem List: Decreased strength;Decreased activity tolerance;Impaired balance (sitting and/or standing);Decreased knowledge of use of DME or AE;Obesity OT Treatment Interventions: Self-care/ADL training;DME and/or AE instruction;Patient/family education   OT Goals(Current goals can be found in the care plan section) Acute Rehab OT Goals Patient Stated Goal: home with husband OT Goal Formulation: With patient Time For Goal Achievement: 08/04/13 Potential to Achieve Goals: Good ADL Goals Pt Will Perform Grooming: with supervision;standing (3 activities) Pt Will Perform Lower Body Bathing: with supervision;sit to/from stand;with min assist Pt Will Perform Lower Body Dressing: sit to/from stand;with min assist Pt Will Transfer to Toilet: with supervision;ambulating;regular height toilet;grab bars Pt Will Perform Toileting - Clothing Manipulation and hygiene: with supervision;sit to/from stand Pt Will Perform Tub/Shower Transfer: Tub transfer;with min guard assist;ambulating;shower seat;rolling walker  Visit Information  Last OT Received On: 07/28/13 Assistance Needed: +1 History of Present Illness: 79 y.o. female who presented to the ED with AMS. She has been getting  more lethargic and not  getting out of bed over the past few days. Per family she also has an extensive history of UTIs, delerium with UTI.       Prior Functioning     Home Living Family/patient expects to be discharged to:: Private residence Living Arrangements: Spouse/significant other Available Help at Discharge: Family;Available 24 hours/day Type of Home: House Home Access: Ramped entrance Home Layout: One level Home Equipment: Walker - 2 wheels;Wheelchair - manual;Grab bars - toilet;Grab bars - tub/shower;Shower seat;Bedside commode (does not use BSC) Additional Comments: pt is not open to SNF Prior Function Level of Independence: Independent with assistive device(s) Comments: amb with RW at baseline, husband sometimes supervised tub transfer Communication Communication: HOH Dominant Hand: Right         Vision/Perception Vision - History Baseline Vision: Wears glasses all the time Patient Visual Report: No change from baseline   Cognition  Cognition Arousal/Alertness: Awake/alert Behavior During Therapy: WFL for tasks assessed/performed Overall Cognitive Status: Within Functional Limits for tasks assessed    Extremity/Trunk Assessment Upper Extremity Assessment Upper Extremity Assessment: Overall WFL for tasks assessed (hx of arthritis in shoulders) Lower Extremity Assessment Lower Extremity Assessment: Defer to PT evaluation     Mobility Bed Mobility Bed Mobility: Not assessed (pt received up in bed) Transfers Transfers: Sit to Stand;Stand to Sit Sit to Stand: With upper extremity assist;From chair/3-in-1;From toilet;4: Min guard Stand to Sit: 4: Min guard;With upper extremity assist;To chair/3-in-1;To toilet Details for Transfer Assistance: Pt is used to pulling up on walker or grab bars at baseline.     Exercise     Balance     End of Session OT - End of Session Activity Tolerance: Patient tolerated treatment well Patient left: in chair;with call  bell/phone within reach  GO     Evern Bio 07/28/2013, 10:05 AM [phone]

## 2013-07-28 NOTE — Progress Notes (Signed)
ANTICOAGULATION CONSULT NOTE - Follow Up Consult  Pharmacy Consult for Warfarin Indication: atrial fibrillation  Allergies  Allergen Reactions  . Ciprofloxacin     Nausea Possibly rash and itching- but not sure   . Gabapentin Nausea And Vomiting  . Insulin Glargine     Itching   . Latex     Rash   . Metformin And Related     Severe diarrhea   . Metoclopramide Hcl     REACTION: unspecified  . Nsaids     REACTION: unspecified  . Promethazine Hcl     REACTION: unspecified    Patient Measurements: Weight: 254 lb 1.6 oz (115.259 kg)  Vital Signs: Temp: 97.7 F (36.5 C) (12/28 0500) Temp src: Oral (12/28 0500) BP: 134/75 mmHg (12/28 0500) Pulse Rate: 80 (12/28 0500)  Labs:  Recent Labs  07/26/13 0535 07/27/13 0538 07/28/13 0610  HGB 10.5* 11.2* 11.8*  HCT 33.6* 35.6* 37.1  PLT 226 238 263  LABPROT 19.9* 18.9* 19.4*  INR 1.75* 1.63* 1.69*  CREATININE 1.09 1.10  --     The CrCl is unknown because both a height and weight (above a minimum accepted value) are required for this calculation.   Medications:  Scheduled:  . cefUROXime  250 mg Oral BID WC  . feeding supplement (GLUCERNA SHAKE)  237 mL Oral Q24H  . furosemide  20 mg Oral Daily  . hydrocortisone  25 mg Rectal Q12H  . hydrocortisone-pramoxine  1 applicator Rectal BID  . insulin aspart  0-9 Units Subcutaneous TID WC  . insulin aspart protamine- aspart  30 Units Subcutaneous BID WC  . levothyroxine  150 mcg Oral Daily  . metoprolol  25 mg Oral BID  . pantoprazole  40 mg Oral Daily  . potassium chloride SA  20 mEq Oral Daily  . simvastatin  40 mg Oral QHS  . Warfarin - Pharmacist Dosing Inpatient   Does not apply q1800   Inpatient warfarin doses administered 12/24 - 12/27:  2.5 mg, 2.5mg , 4mg , 4mg    Assessment: 79 yo F on chronic warfarin therapy for atrial fibrillation, admitted with altered mental status and UTI. Patient is followed by [facility]; Home warfarin dosage  reported as 2.5 mg daily except 5 mg on Mondays. INR therapeutic on admit 2.4  INR subtherapeutic x 3 days (1.69).    Hgb/Hct and Pltc stable.  No bleeding reported in chart.  Goal of Therapy:  INR 2-3   Plan:   Warfarin 5 mg PO x1 today at 12 noon  PT/INR daily while inpatient.  Loralee Pacas, PharmD, BCPS Pager: [phone]  07/28/2013  7:15 AM

## 2013-07-30 LAB — CULTURE, BLOOD (ROUTINE X 2): Culture: NO GROWTH

## 2013-08-02 ENCOUNTER — Other Ambulatory Visit: Payer: Medicare Other

## 2013-08-02 ENCOUNTER — Ambulatory Visit: Payer: Medicare Other

## 2013-08-05 ENCOUNTER — Ambulatory Visit (INDEPENDENT_AMBULATORY_CARE_PROVIDER_SITE_OTHER)

## 2013-08-05 ENCOUNTER — Ambulatory Visit (INDEPENDENT_AMBULATORY_CARE_PROVIDER_SITE_OTHER): Payer: Medicare Other | Admitting: Family Medicine

## 2013-08-05 ENCOUNTER — Encounter: Payer: Self-pay | Admitting: Family Medicine

## 2013-08-05 ENCOUNTER — Ambulatory Visit: Payer: Medicare Other

## 2013-08-05 VITALS — BP 110/70 | HR 75 | Temp 97.7°F

## 2013-08-05 DIAGNOSIS — N289 Disorder of kidney and ureter, unspecified: Secondary | ICD-10-CM

## 2013-08-05 DIAGNOSIS — R197 Diarrhea, unspecified: Secondary | ICD-10-CM

## 2013-08-05 DIAGNOSIS — N390 Urinary tract infection, site not specified: Secondary | ICD-10-CM

## 2013-08-05 DIAGNOSIS — I4891 Unspecified atrial fibrillation: Secondary | ICD-10-CM

## 2013-08-05 DIAGNOSIS — D649 Anemia, unspecified: Secondary | ICD-10-CM

## 2013-08-05 DIAGNOSIS — Z5181 Encounter for therapeutic drug level monitoring: Secondary | ICD-10-CM

## 2013-08-05 DIAGNOSIS — I10 Essential (primary) hypertension: Secondary | ICD-10-CM

## 2013-08-05 DIAGNOSIS — Z7901 Long term (current) use of anticoagulants: Secondary | ICD-10-CM

## 2013-08-05 LAB — BASIC METABOLIC PANEL
BUN: 21 mg/dL (ref 6–23)
CO2: 23 mEq/L (ref 19–32)
Calcium: 7.9 mg/dL — ABNORMAL LOW (ref 8.4–10.5)
Chloride: 106 mEq/L (ref 96–112)
Creatinine, Ser: 1.4 mg/dL — ABNORMAL HIGH (ref 0.4–1.2)
GFR: 38.63 mL/min — AB (ref >60.00)
Glucose, Bld: 308 mg/dL — ABNORMAL HIGH (ref 70–99)
Potassium: 3.6 mEq/L (ref 3.5–5.1)
SODIUM: 140 meq/L (ref 135–145)

## 2013-08-05 LAB — CBC WITH DIFFERENTIAL/PLATELET
Basophils Absolute: 0.1 10*3/uL (ref 0.0–0.1)
Basophils Relative: 1.3 % (ref 0.0–3.0)
Eosinophils Absolute: 0.2 10*3/uL (ref 0.0–0.7)
Eosinophils Relative: 3.4 % (ref 0.0–5.0)
HCT: 33.7 % — ABNORMAL LOW (ref 36.0–46.0)
HEMOGLOBIN: 10.9 g/dL — AB (ref 12.0–15.0)
LYMPHS PCT: 20.8 % (ref 12.0–46.0)
Lymphs Abs: 1.2 10*3/uL (ref 0.7–4.0)
MCHC: 32.3 g/dL (ref 30.0–36.0)
MCV: 92.6 fl (ref 78.0–100.0)
MONOS PCT: 7.4 % (ref 3.0–12.0)
Monocytes Absolute: 0.4 10*3/uL (ref 0.1–1.0)
NEUTROS ABS: 4 10*3/uL (ref 1.4–7.7)
Neutrophils Relative %: 67.1 % (ref 43.0–77.0)
Platelets: 343 10*3/uL (ref 150.0–400.0)
RBC: 3.64 Mil/uL — AB (ref 3.87–5.11)
RDW: 18.3 % — ABNORMAL HIGH (ref 11.5–14.6)
WBC: 6 10*3/uL (ref 4.5–10.5)

## 2013-08-05 NOTE — Patient Instructions (Addendum)
Labs today for blood count and kidney function Also a stool test for C difficile since you have diarrhea  Also doing protime today through blood draw   We will update you when labs return

## 2013-08-05 NOTE — Progress Notes (Signed)
Pre-visit discussion using our clinic review tool. No additional management support is needed unless otherwise documented below in the visit note.  

## 2013-08-05 NOTE — Assessment & Plan Note (Signed)
S/p abx and hosp stay Check c diff and tx if necessary  Pt does have IBS and frequent diarrhea baseline anyway

## 2013-08-05 NOTE — Progress Notes (Signed)
Subjective:    Patient ID: [name], female    DOB: 1922/12/11, 80 y.o.   MRN: [id number]  HPI Here for hosp f/u hospt at [facility] from 12/23 to 12/28- transitional call made after d/c uti with delerium tx with rocephin and then cefuroxime - (citrobacter uti also sens to macrobid) Had to be transported by EMS  Renal insuf worsened in hosp  Her lasix dose was cut to 20 from 40  Also K was supplemented    Chemistry      Component Value Date/Time   NA 136 07/28/2013 0610   K 4.6 07/28/2013 0610   CL 97 07/28/2013 0610   CO2 30 07/28/2013 0610   BUN 18 07/28/2013 0610   CREATININE 1.14* 07/28/2013 0610      Component Value Date/Time   CALCIUM 9.4 07/28/2013 0610   ALKPHOS 121* 07/25/2013 0505   AST 25 07/25/2013 0505   ALT 33 07/25/2013 0505   BILITOT 0.7 07/25/2013 0505      Lab Results  Component Value Date   WBC 10.1 07/28/2013   HGB 11.8* 07/28/2013   HCT 37.1 07/28/2013   MCV 96.1 07/28/2013   PLT 263 07/28/2013    Pt states she is still quite lethargic - wants to stay in bed all day  No more urinary symptoms - no burning or blood in urine  No more confusion  Finished 5 d of ceftin  Does not drink enough -water - "she tries as hard as she can"   She finished her antibiotic - her stool changed from yellow to grey  Stool is still very loose  No cramping   Patient Active Problem List   Diagnosis Date Noted  . UTI (lower urinary tract infection) 07/23/2013  . Rectal bleeding 04/26/2013  . History of cerebrovascular disease 04/24/2013  . Abnormality of gait 04/24/2013  . Pyelonephritis 04/08/2013  . Bullous pemphigoid 04/08/2013  . Acute pyelonephritis 03/10/2013  . Blister 02/04/2013  . Candidal intertrigo 11/12/2012  . Ear discomfort 10/16/2012  . Left facial swelling 10/16/2012  . Renal insufficiency 07/17/2012  . Anemia 06/17/2012  . Metabolic encephalopathy 62/95/2841  . Atrial fibrillation with RVR 06/15/2012  . DM type 2 with diabetic  peripheral neuropathy 04/10/2012  . Pain, chronic 12/07/2011  . Frequent falls 12/07/2011  . Benign positional vertigo 10/19/2011  . Dysphagia 10/04/2011  . B12 deficiency 02/18/2011  . Lump in the abdomen 11/24/2010  . UNSPECIFIED VITAMIN D DEFICIENCY 07/12/2010  . OSTEOPOROSIS 06/30/2010  . CHRONIC RHINITIS 01/13/2010  . ADENOMATOUS COLONIC POLYP 05/26/2008  . IRRITABLE BOWEL SYNDROME 09/03/2007  . INTERTRIGO 06/29/2007  . DIABETES MELLITUS, TYPE II 01/12/2007  . HYPERLIPIDEMIA 01/12/2007  . HYPERTENSION 01/12/2007  . Atrial fibrillation 01/12/2007  . GERD 01/12/2007  . HIATAL HERNIA 01/12/2007  . ROSACEA 01/12/2007   Past Medical History  Diagnosis Date  . Atrial fibrillation   . GERD (gastroesophageal reflux disease)   . Hyperlipidemia   . Hypertension   . Hypothyroid   . Obesity   . Vaginal dysplasia 1980  . Rosacea   . Chest pain 11/2002    cardiac cath neg   . Back pain   . IBS (irritable bowel syndrome)     with diarrhea  . Asthma     as a child  . Adenomatous colon polyp 12/2007  . Hemorrhoids   . Vertigo   . Hiatal hernia 12/2007    EGD  . CHF (congestive heart failure)   . Complication of  anesthesia     "dr said I should never be put to sleep again" (03/07/2013)  . Exertional shortness of breath   . Type II diabetes mellitus   . Iron deficiency anemia   . Arthritis     "all over my body" (03/07/2013)  . Vaginal cancer   . History of cerebrovascular disease 04/24/2013  . Abnormality of gait 04/24/2013   Past Surgical History  Procedure Laterality Date  . Cholecystectomy  2006  . Joint replacement      Rt TKR [place] 2001  . Cervical cone biopsy  1963    D&C  . Cystocele repair  1978    /rectocele  . Orif ankle fracture Right 2006  . Total knee arthroplasty Right 1993  . Total knee arthroplasty Left 2001  . Cataract extraction w/ intraocular lens  implant, bilateral Bilateral 2006  . Ankle fracture surgery Right 2011    fall/followed by  rehab stay; "hand to have 9 pins put in it" (03/07/2013)  . Cardiac catheterization  11/2002    Archie Endo 12/11/2002 (03/07/2013)  . Appendectomy  1941    Archie Endo 11/17/1999 (03/07/2013)  . Abdominal hysterectomy  1963    partial/notes 11/17/1999 (03/07/2013   History  Substance Use Topics  . Smoking status: Former Smoker -- 1.00 packs/day for 40 years    Types: Cigarettes  . Smokeless tobacco: Never Used     Comment: 03/07/2013 "quit smoking 35 years ago"  . Alcohol Use: No   Family History  Problem Relation Age of Onset  . Cancer Mother     leukemia  . Coronary artery disease Mother   . Dementia Mother   . Heart disease Father     CAD  . COPD Sister   . Heart disease Brother   . Diabetes Brother   . Kidney disease Son   . Colon cancer Brother    Allergies  Allergen Reactions  . Ciprofloxacin     Nausea Possibly rash and itching- but not sure   . Gabapentin Nausea And Vomiting  . Insulin Glargine     Itching   . Latex     Rash   . Metformin And Related     Severe diarrhea   . Metoclopramide Hcl     REACTION: unspecified  . Nsaids     REACTION: unspecified  . Promethazine Hcl     REACTION: unspecified   Current Outpatient Prescriptions on File Prior to Visit  Medication Sig Dispense Refill  . acetaminophen (TYLENOL) 500 MG tablet Take 500 mg by mouth every 6 (six) hours as needed for pain. For pain      . BD PEN NEEDLE NANO U/F 32G X 4 MM MISC USE AS DIRECTED WITH INSULIN PEN  100 each  1  . cefUROXime (CEFTIN) 250 MG tablet Take 1 tablet (250 mg total) by mouth 2 (two) times daily with a meal.  10 tablet  0  . furosemide (LASIX) 40 MG tablet Take 0.5 - 1 tablet by mouth every day  30 tablet  5  . glucose blood (ONE TOUCH ULTRA TEST) test strip Check sugars 2x a day  200 each  11  . hydrocortisone (ANUSOL-HC) 25 MG suppository Place 1 suppository (25 mg total) rectally every 12 (twelve) hours.  10 suppository  1  . hydrocortisone-pramoxine (PROCTOFOAM HC) rectal foam Place 1  applicator rectally 2 (two) times daily.  10 g  0  . insulin NPH-regular (HUMULIN 70/30) (70-30) 100 UNIT/ML injection Inject under  skin 50 units 30 min before breakfast and dinner. Pens please.  30 mL  5  . Insulin Syringe-Needle U-100 (BD INSULIN SYRINGE ULTRAFINE) 31G X 15/64" 1 ML MISC 1 each by Does not apply route every morning. Use 2 to 3 times daily as directed.  100 each  5  . levothyroxine (SYNTHROID, LEVOTHROID) 150 MCG tablet Take 1 tablet (150 mcg total) by mouth daily.  90 tablet  1  . metoprolol (LOPRESSOR) 50 MG tablet Take 0.5 tablets (25 mg total) by mouth 2 (two) times daily.  90 tablet  1  . omeprazole (PRILOSEC) 20 MG capsule Take 1 capsule (20 mg total) by mouth daily.  90 capsule  1  . potassium chloride SA (K-DUR,KLOR-CON) 20 MEQ tablet Take 1 tablet by mouth  daily  90 tablet  1  . simvastatin (ZOCOR) 40 MG tablet Take 1 tablet (40 mg total) by mouth at bedtime.  90 tablet  1  . traMADol (ULTRAM) 50 MG tablet 1-2 pills by mouth up to three times daily as needed for severe pain  540 tablet  3  . warfarin (COUMADIN) 5 MG tablet Take 2.5-5 mg by mouth See admin instructions. Takes 5mg  on Monday & 2.5mg  on all other days      . warfarin (COUMADIN) 5 MG tablet Take as directed by  Coumadin Clinic.  30 tablet  2   No current facility-administered medications on file prior to visit.     Review of Systems Review of Systems  Constitutional: Negative for fever, appetite change,  and unexpected weight change. pos for fatigue  Eyes: Negative for pain and visual disturbance.  Respiratory: Negative for cough and shortness of breath.   Cardiovascular: Negative for cp or palpitations    Gastrointestinal: Negative for abd pain /blood in stool or dark stool Genitourinary: Negative for urgency and frequency. pos for baseline urinary incontinence  Skin: Negative for pallor and pos rash from pemphigoid  MSK pos for chronic OA pain   Neurological: Negative for weakness, light-headedness,  numbness and headaches.  Hematological: Negative for adenopathy. Does not bruise/bleed easily.  Psychiatric/Behavioral: Negative for dysphoric mood. The patient is  nervous/anxious.         Objective:   Physical Exam  Constitutional: She appears well-developed and well-nourished. No distress.  obese and frail  appearing   Wheelchair bound   HENT:  Head: Normocephalic and atraumatic.  Mouth/Throat: Oropharynx is clear and moist.  Eyes: Conjunctivae and EOM are normal. Pupils are equal, round, and reactive to light. Right eye exhibits no discharge. Left eye exhibits no discharge. No scleral icterus.  Neck: Normal range of motion. Neck supple. No JVD present. Carotid bruit is not present. No thyromegaly present.  Cardiovascular: Normal rate and intact distal pulses.  Exam reveals no gallop.   Pulmonary/Chest: Effort normal and breath sounds normal. No respiratory distress. She has no wheezes. She has no rales.  Abdominal: Soft. Bowel sounds are normal. She exhibits no distension and no mass. There is tenderness. There is no rebound and no guarding.  Mild bilat LQ tenderness -no rebound or guarding   Musculoskeletal: She exhibits no edema.  Lymphadenopathy:    She has no cervical adenopathy.  Neurological: She is alert. She has normal reflexes.  Skin: Skin is warm and dry. No erythema. No pallor.  Some excoriations on arms and back along with healing pemphigoid lesions  Psychiatric: Her mood appears anxious.          Assessment & Plan:

## 2013-08-05 NOTE — Assessment & Plan Note (Signed)
Clinically improved- s/p cephalosporin Rev hosp records in detail  Will continue to follow

## 2013-08-05 NOTE — Assessment & Plan Note (Signed)
INR today.

## 2013-08-05 NOTE — Assessment & Plan Note (Signed)
Lab today  S/p uti with dehydration  Enc fluids -pt says she cannot force herself to drink more - husband offered to keep working on it with her

## 2013-08-05 NOTE — Assessment & Plan Note (Signed)
bp stable  BP: 110/70 mmHg   bp in fair control at this time  No changes needed Disc lifstyle change with low sodium diet and exercise

## 2013-08-05 NOTE — Assessment & Plan Note (Signed)
Mild in hosp  Re check today Suspect rel to chronic disease

## 2013-08-06 ENCOUNTER — Telehealth: Payer: Self-pay | Admitting: Family Medicine

## 2013-08-06 ENCOUNTER — Inpatient Hospital Stay (HOSPITAL_COMMUNITY)
Admission: EM | Admit: 2013-08-06 | Discharge: 2013-08-19 | DRG: 435 | Disposition: A | Discharge status: Hospice | Payer: Medicare Other | Attending: Internal Medicine | Admitting: Internal Medicine

## 2013-08-06 ENCOUNTER — Encounter (HOSPITAL_COMMUNITY): Payer: Self-pay | Admitting: Emergency Medicine

## 2013-08-06 DIAGNOSIS — Z87891 Personal history of nicotine dependence: Secondary | ICD-10-CM

## 2013-08-06 DIAGNOSIS — Z96659 Presence of unspecified artificial knee joint: Secondary | ICD-10-CM

## 2013-08-06 DIAGNOSIS — Z7189 Other specified counseling: Secondary | ICD-10-CM

## 2013-08-06 DIAGNOSIS — Z6841 Body Mass Index (BMI) 40.0 and over, adult: Secondary | ICD-10-CM

## 2013-08-06 DIAGNOSIS — R296 Repeated falls: Secondary | ICD-10-CM

## 2013-08-06 DIAGNOSIS — L719 Rosacea, unspecified: Secondary | ICD-10-CM | POA: Diagnosis present

## 2013-08-06 DIAGNOSIS — I10 Essential (primary) hypertension: Secondary | ICD-10-CM | POA: Diagnosis present

## 2013-08-06 DIAGNOSIS — Z515 Encounter for palliative care: Secondary | ICD-10-CM

## 2013-08-06 DIAGNOSIS — I679 Cerebrovascular disease, unspecified: Secondary | ICD-10-CM | POA: Diagnosis present

## 2013-08-06 DIAGNOSIS — C7889 Secondary malignant neoplasm of other digestive organs: Secondary | ICD-10-CM | POA: Diagnosis present

## 2013-08-06 DIAGNOSIS — N179 Acute kidney failure, unspecified: Secondary | ICD-10-CM | POA: Diagnosis present

## 2013-08-06 DIAGNOSIS — Z8249 Family history of ischemic heart disease and other diseases of the circulatory system: Secondary | ICD-10-CM

## 2013-08-06 DIAGNOSIS — E1142 Type 2 diabetes mellitus with diabetic polyneuropathy: Secondary | ICD-10-CM | POA: Diagnosis present

## 2013-08-06 DIAGNOSIS — R932 Abnormal findings on diagnostic imaging of liver and biliary tract: Secondary | ICD-10-CM

## 2013-08-06 DIAGNOSIS — C259 Malignant neoplasm of pancreas, unspecified: Principal | ICD-10-CM | POA: Diagnosis present

## 2013-08-06 DIAGNOSIS — J9600 Acute respiratory failure, unspecified whether with hypoxia or hypercapnia: Secondary | ICD-10-CM | POA: Diagnosis not present

## 2013-08-06 DIAGNOSIS — R791 Abnormal coagulation profile: Secondary | ICD-10-CM | POA: Diagnosis present

## 2013-08-06 DIAGNOSIS — D509 Iron deficiency anemia, unspecified: Secondary | ICD-10-CM | POA: Diagnosis present

## 2013-08-06 DIAGNOSIS — R945 Abnormal results of liver function studies: Secondary | ICD-10-CM

## 2013-08-06 DIAGNOSIS — I272 Pulmonary hypertension, unspecified: Secondary | ICD-10-CM | POA: Diagnosis present

## 2013-08-06 DIAGNOSIS — E876 Hypokalemia: Secondary | ICD-10-CM | POA: Diagnosis not present

## 2013-08-06 DIAGNOSIS — I509 Heart failure, unspecified: Secondary | ICD-10-CM | POA: Diagnosis present

## 2013-08-06 DIAGNOSIS — Z8544 Personal history of malignant neoplasm of other female genital organs: Secondary | ICD-10-CM

## 2013-08-06 DIAGNOSIS — Z860100 Personal history of colon polyps, unspecified: Secondary | ICD-10-CM

## 2013-08-06 DIAGNOSIS — H8110 Benign paroxysmal vertigo, unspecified ear: Secondary | ICD-10-CM

## 2013-08-06 DIAGNOSIS — K219 Gastro-esophageal reflux disease without esophagitis: Secondary | ICD-10-CM | POA: Diagnosis present

## 2013-08-06 DIAGNOSIS — E119 Type 2 diabetes mellitus without complications: Secondary | ICD-10-CM

## 2013-08-06 DIAGNOSIS — D649 Anemia, unspecified: Secondary | ICD-10-CM

## 2013-08-06 DIAGNOSIS — Z79899 Other long term (current) drug therapy: Secondary | ICD-10-CM

## 2013-08-06 DIAGNOSIS — K589 Irritable bowel syndrome without diarrhea: Secondary | ICD-10-CM | POA: Diagnosis present

## 2013-08-06 DIAGNOSIS — I4891 Unspecified atrial fibrillation: Secondary | ICD-10-CM | POA: Diagnosis present

## 2013-08-06 DIAGNOSIS — I129 Hypertensive chronic kidney disease with stage 1 through stage 4 chronic kidney disease, or unspecified chronic kidney disease: Secondary | ICD-10-CM | POA: Diagnosis present

## 2013-08-06 DIAGNOSIS — Z806 Family history of leukemia: Secondary | ICD-10-CM

## 2013-08-06 DIAGNOSIS — K8689 Other specified diseases of pancreas: Secondary | ICD-10-CM

## 2013-08-06 DIAGNOSIS — N289 Disorder of kidney and ureter, unspecified: Secondary | ICD-10-CM

## 2013-08-06 DIAGNOSIS — N189 Chronic kidney disease, unspecified: Secondary | ICD-10-CM | POA: Diagnosis present

## 2013-08-06 DIAGNOSIS — C7800 Secondary malignant neoplasm of unspecified lung: Secondary | ICD-10-CM | POA: Diagnosis present

## 2013-08-06 DIAGNOSIS — E039 Hypothyroidism, unspecified: Secondary | ICD-10-CM | POA: Diagnosis present

## 2013-08-06 DIAGNOSIS — Z66 Do not resuscitate: Secondary | ICD-10-CM | POA: Diagnosis not present

## 2013-08-06 DIAGNOSIS — Z800 Family history of malignant neoplasm of digestive organs: Secondary | ICD-10-CM

## 2013-08-06 DIAGNOSIS — I2789 Other specified pulmonary heart diseases: Secondary | ICD-10-CM | POA: Diagnosis present

## 2013-08-06 DIAGNOSIS — J9602 Acute respiratory failure with hypercapnia: Secondary | ICD-10-CM | POA: Diagnosis not present

## 2013-08-06 DIAGNOSIS — R7989 Other specified abnormal findings of blood chemistry: Secondary | ICD-10-CM

## 2013-08-06 DIAGNOSIS — Z8679 Personal history of other diseases of the circulatory system: Secondary | ICD-10-CM

## 2013-08-06 DIAGNOSIS — L129 Pemphigoid, unspecified: Secondary | ICD-10-CM | POA: Diagnosis present

## 2013-08-06 DIAGNOSIS — E785 Hyperlipidemia, unspecified: Secondary | ICD-10-CM | POA: Diagnosis present

## 2013-08-06 DIAGNOSIS — B370 Candidal stomatitis: Secondary | ICD-10-CM | POA: Diagnosis not present

## 2013-08-06 DIAGNOSIS — Z794 Long term (current) use of insulin: Secondary | ICD-10-CM

## 2013-08-06 DIAGNOSIS — J45909 Unspecified asthma, uncomplicated: Secondary | ICD-10-CM | POA: Diagnosis present

## 2013-08-06 DIAGNOSIS — E1149 Type 2 diabetes mellitus with other diabetic neurological complication: Secondary | ICD-10-CM

## 2013-08-06 DIAGNOSIS — L120 Bullous pemphigoid: Secondary | ICD-10-CM

## 2013-08-06 DIAGNOSIS — Z8673 Personal history of transient ischemic attack (TIA), and cerebral infarction without residual deficits: Secondary | ICD-10-CM

## 2013-08-06 DIAGNOSIS — B372 Candidiasis of skin and nail: Secondary | ICD-10-CM | POA: Diagnosis not present

## 2013-08-06 DIAGNOSIS — Z833 Family history of diabetes mellitus: Secondary | ICD-10-CM

## 2013-08-06 DIAGNOSIS — Z7901 Long term (current) use of anticoagulants: Secondary | ICD-10-CM

## 2013-08-06 DIAGNOSIS — T502X5A Adverse effect of carbonic-anhydrase inhibitors, benzothiadiazides and other diuretics, initial encounter: Secondary | ICD-10-CM | POA: Diagnosis not present

## 2013-08-06 DIAGNOSIS — T45515A Adverse effect of anticoagulants, initial encounter: Secondary | ICD-10-CM | POA: Diagnosis present

## 2013-08-06 DIAGNOSIS — K831 Obstruction of bile duct: Secondary | ICD-10-CM

## 2013-08-06 HISTORY — DX: Nausea with vomiting, unspecified: R11.2

## 2013-08-06 HISTORY — DX: Other specified postprocedural states: Z98.890

## 2013-08-06 LAB — BASIC METABOLIC PANEL
BUN: 22 mg/dL (ref 6–23)
CO2: 23 meq/L (ref 19–32)
CREATININE: 1.26 mg/dL — AB (ref 0.50–1.10)
Calcium: 8.4 mg/dL (ref 8.4–10.5)
Chloride: 102 mEq/L (ref 96–112)
GFR calc non Af Amer: 37 mL/min — ABNORMAL LOW (ref >90)
GFR, EST AFRICAN AMERICAN: 43 mL/min — AB (ref >90)
GLUCOSE: 282 mg/dL — AB (ref 70–99)
Potassium: 3.7 mEq/L (ref 3.7–5.3)
Sodium: 140 mEq/L (ref 137–147)

## 2013-08-06 LAB — HEMOGLOBIN A1C
HEMOGLOBIN A1C: 8.3 % — AB (ref <5.7)
Mean Plasma Glucose: 192 mg/dL — ABNORMAL HIGH (ref <117)

## 2013-08-06 LAB — CBC WITH DIFFERENTIAL/PLATELET
BASOS PCT: 2 % — AB (ref 0–1)
Basophils Absolute: 0.1 10*3/uL (ref 0.0–0.1)
Eosinophils Absolute: 0.3 10*3/uL (ref 0.0–0.7)
Eosinophils Relative: 5 % (ref 0–5)
HEMATOCRIT: 34.4 % — AB (ref 36.0–46.0)
HEMOGLOBIN: 11.4 g/dL — AB (ref 12.0–15.0)
LYMPHS ABS: 1 10*3/uL (ref 0.7–4.0)
Lymphocytes Relative: 15 % (ref 12–46)
MCH: 30.3 pg (ref 26.0–34.0)
MCHC: 33.1 g/dL (ref 30.0–36.0)
MCV: 91.5 fL (ref 78.0–100.0)
MONO ABS: 0.2 10*3/uL (ref 0.1–1.0)
Monocytes Relative: 3 % (ref 3–12)
Neutro Abs: 5.1 10*3/uL (ref 1.7–7.7)
Neutrophils Relative %: 75 % (ref 43–77)
Platelets: 366 10*3/uL (ref 150–400)
RBC: 3.76 MIL/uL — AB (ref 3.87–5.11)
RDW: 16.6 % — ABNORMAL HIGH (ref 11.5–15.5)
WBC: 6.8 10*3/uL (ref 4.0–10.5)

## 2013-08-06 LAB — PROTIME-INR
INR: 15.4 ratio — AB (ref 0.8–1.0)
INR: 7.72 (ref 0.00–1.49)
Prothrombin Time: 154.2 s (ref 10.2–12.4)
Prothrombin Time: 61.9 seconds — ABNORMAL HIGH (ref 11.6–15.2)

## 2013-08-06 LAB — GLUCOSE, CAPILLARY: GLUCOSE-CAPILLARY: 233 mg/dL — AB (ref 70–99)

## 2013-08-06 LAB — APTT: APTT: 95 s — AB (ref 24–37)

## 2013-08-06 MED ORDER — FUROSEMIDE 20 MG PO TABS
20.0000 mg | ORAL_TABLET | Freq: Every morning | ORAL | Status: DC
  Administered 2013-08-07 – 2013-08-09 (×3): 20 mg via ORAL
  Filled 2013-08-06 (×4): qty 1

## 2013-08-06 MED ORDER — POTASSIUM CHLORIDE CRYS ER 20 MEQ PO TBCR
20.0000 meq | EXTENDED_RELEASE_TABLET | Freq: Every morning | ORAL | Status: DC
Start: 2013-08-07 — End: 2013-08-12
  Administered 2013-08-07 – 2013-08-11 (×5): 20 meq via ORAL
  Filled 2013-08-06 (×6): qty 1

## 2013-08-06 MED ORDER — PANTOPRAZOLE SODIUM 40 MG PO TBEC
40.0000 mg | DELAYED_RELEASE_TABLET | Freq: Every day | ORAL | Status: DC
  Administered 2013-08-06 – 2013-08-19 (×14): 40 mg via ORAL
  Filled 2013-08-06 (×14): qty 1

## 2013-08-06 MED ORDER — TRAMADOL HCL 50 MG PO TABS
50.0000 mg | ORAL_TABLET | Freq: Three times a day (TID) | ORAL | Status: DC | PRN
  Administered 2013-08-16: 100 mg via ORAL
  Filled 2013-08-06: qty 2

## 2013-08-06 MED ORDER — ACETAMINOPHEN 650 MG RE SUPP: 650.0000 mg | Freq: Four times a day (QID) | RECTAL | Status: DC | PRN

## 2013-08-06 MED ORDER — PHYTONADIONE 5 MG PO TABS: 5.0000 mg | ORAL_TABLET | Freq: Once | ORAL | Status: DC

## 2013-08-06 MED ORDER — INSULIN ASPART 100 UNIT/ML ~~LOC~~ SOLN
0.0000 [IU] | SUBCUTANEOUS | Status: DC
  Administered 2013-08-06: 5 [IU] via SUBCUTANEOUS
  Administered 2013-08-07 (×2): 8 [IU] via SUBCUTANEOUS
  Administered 2013-08-07: 11 [IU] via SUBCUTANEOUS
  Administered 2013-08-07: 3 [IU] via SUBCUTANEOUS
  Administered 2013-08-07: 5 [IU] via SUBCUTANEOUS
  Administered 2013-08-07: 15 [IU] via SUBCUTANEOUS
  Administered 2013-08-08: 5 [IU] via SUBCUTANEOUS
  Administered 2013-08-08: 8 [IU] via SUBCUTANEOUS
  Administered 2013-08-08: 3 [IU] via SUBCUTANEOUS
  Administered 2013-08-08 (×2): 8 [IU] via SUBCUTANEOUS
  Administered 2013-08-08: 3 [IU] via SUBCUTANEOUS
  Administered 2013-08-09: 2 [IU] via SUBCUTANEOUS
  Administered 2013-08-09 (×2): 5 [IU] via SUBCUTANEOUS
  Administered 2013-08-09: 8 [IU] via SUBCUTANEOUS
  Administered 2013-08-09: 11 [IU] via SUBCUTANEOUS
  Administered 2013-08-09: 3 [IU] via SUBCUTANEOUS
  Administered 2013-08-10 (×2): 5 [IU] via SUBCUTANEOUS
  Administered 2013-08-10 – 2013-08-11 (×4): 8 [IU] via SUBCUTANEOUS
  Administered 2013-08-11 (×2): 3 [IU] via SUBCUTANEOUS
  Administered 2013-08-11: 11 [IU] via SUBCUTANEOUS
  Administered 2013-08-11: 5 [IU] via SUBCUTANEOUS
  Administered 2013-08-12: 11 [IU] via SUBCUTANEOUS
  Administered 2013-08-12: 15 [IU] via SUBCUTANEOUS
  Administered 2013-08-12: 5 [IU] via SUBCUTANEOUS

## 2013-08-06 MED ORDER — DIPHENHYDRAMINE HCL 25 MG PO CAPS
25.0000 mg | ORAL_CAPSULE | Freq: Four times a day (QID) | ORAL | Status: DC | PRN
  Administered 2013-08-16: 25 mg via ORAL
  Filled 2013-08-06: qty 1

## 2013-08-06 MED ORDER — PHYTONADIONE 5 MG PO TABS
2.5000 mg | ORAL_TABLET | Freq: Once | ORAL | Status: AC
  Administered 2013-08-06: 2.5 mg via ORAL
  Filled 2013-08-06: qty 1

## 2013-08-06 MED ORDER — SODIUM CHLORIDE 0.9 % IV SOLN
INTRAVENOUS | Status: DC
  Administered 2013-08-06 – 2013-08-10 (×5): via INTRAVENOUS
  Administered 2013-08-10: 20 mL/h via INTRAVENOUS
  Administered 2013-08-14: 13:00:00 via INTRAVENOUS

## 2013-08-06 MED ORDER — OXYCODONE HCL 5 MG PO TABS
5.0000 mg | ORAL_TABLET | ORAL | Status: DC | PRN
  Administered 2013-08-15: 5 mg via ORAL
  Filled 2013-08-06 (×2): qty 1

## 2013-08-06 MED ORDER — DIPHENHYDRAMINE HCL 50 MG/ML IJ SOLN: 25.0000 mg | Freq: Four times a day (QID) | INTRAMUSCULAR | Status: DC | PRN

## 2013-08-06 MED ORDER — LEVOTHYROXINE SODIUM 150 MCG PO TABS
150.0000 ug | ORAL_TABLET | Freq: Every day | ORAL | Status: DC
  Administered 2013-08-07 – 2013-08-19 (×13): 150 ug via ORAL
  Filled 2013-08-06 (×15): qty 1

## 2013-08-06 MED ORDER — ACETAMINOPHEN 500 MG PO TABS: 500.0000 mg | ORAL_TABLET | Freq: Four times a day (QID) | ORAL | Status: DC | PRN

## 2013-08-06 MED ORDER — FLUOCINONIDE 0.05 % EX CREA
1.0000 "application " | TOPICAL_CREAM | Freq: Two times a day (BID) | CUTANEOUS | Status: DC | PRN
  Administered 2013-08-19: 1 via TOPICAL
  Filled 2013-08-06 (×2): qty 30

## 2013-08-06 MED ORDER — SILVER SULFADIAZINE 1 % EX CREA
1.0000 "application " | TOPICAL_CREAM | Freq: Every day | CUTANEOUS | Status: DC | PRN
  Filled 2013-08-06 (×2): qty 85

## 2013-08-06 MED ORDER — LOPERAMIDE HCL 2 MG PO CAPS: 2.0000 mg | ORAL_CAPSULE | Freq: Four times a day (QID) | ORAL | Status: DC | PRN

## 2013-08-06 MED ORDER — SIMVASTATIN 40 MG PO TABS
40.0000 mg | ORAL_TABLET | Freq: Every day | ORAL | Status: DC
  Administered 2013-08-06 – 2013-08-08 (×3): 40 mg via ORAL
  Filled 2013-08-06 (×4): qty 1

## 2013-08-06 MED ORDER — HYDROCORTISONE 1 % EX CREA
1.0000 "application " | TOPICAL_CREAM | Freq: Two times a day (BID) | CUTANEOUS | Status: DC | PRN
  Filled 2013-08-06: qty 28

## 2013-08-06 MED ORDER — ACETAMINOPHEN 325 MG PO TABS: 650.0000 mg | ORAL_TABLET | Freq: Four times a day (QID) | ORAL | Status: DC | PRN

## 2013-08-06 MED ORDER — ONDANSETRON HCL 4 MG/2ML IJ SOLN
4.0000 mg | Freq: Three times a day (TID) | INTRAMUSCULAR | Status: AC | PRN
Start: 2013-08-06 — End: 2013-08-07

## 2013-08-06 MED ORDER — METOPROLOL TARTRATE 25 MG PO TABS
25.0000 mg | ORAL_TABLET | Freq: Two times a day (BID) | ORAL | Status: DC
  Administered 2013-08-06 – 2013-08-16 (×20): 25 mg via ORAL
  Filled 2013-08-06 (×22): qty 1

## 2013-08-06 MED ORDER — HYDROXYZINE HCL 10 MG PO TABS
10.0000 mg | ORAL_TABLET | Freq: Every evening | ORAL | Status: DC | PRN
  Filled 2013-08-06: qty 3

## 2013-08-06 NOTE — Telephone Encounter (Signed)
Pt INR results 15.4 via blood draw in lab yesterday.  Pt was called by Dr. Gilford Rile last night and told that she needed to be seen at the hospital.  Pt called this morning and said she was not going to the hospital and would rather come back to our office.  I told pt that it was extremely dangerous for her INR to be that high and that she could hemorrhage or bleed out without being able to stop it at that level.  I advised her that this was an emergent situation and that Dr. Glori Bickers advised her to the ED as well.  Instructed pt not to take Coumadin until she was seen by ED.  She argued with me for about 10 minutes and then said she would talk to her husband about it.

## 2013-08-06 NOTE — Telephone Encounter (Signed)
It looks like she did finally go to ER per epic-pending records

## 2013-08-06 NOTE — ED Notes (Signed)
Bed: SH68 Expected date:  Expected time:  Means of arrival:  Comments: EMS unresponsive

## 2013-08-06 NOTE — Progress Notes (Signed)
   CARE MANAGEMENT ED NOTE 08/06/2013  Patient:  [name], [name]   Account Number:  [id number]  Date Initiated:  08/06/2013  Documentation initiated by:  Livia Snellen  Subjective/Objective Assessment:   Patient presents to Ed with abnormal labs.  Patient on coumadin at home.     Subjective/Objective Assessment Detail:   Patient with recent admission 12/23- 12/28 for UTI.  INR this admission was 15.4.     Action/Plan:   Action/Plan Detail:   Anticipated DC Date:       Status Recommendation to Physician:   Result of Recommendation:    Other ED Services  Consult [place]  Other    Choice offered to / List presented to:            Status of service:  Completed, signed off  ED Comments:   ED Comments Detail:  EDCM spoke to patient at bedside.  Patient reports she lives at home with her husband.  She reports she supposed to have services with [facility], but she is unsure of what services she has and no one has been out to see her yet. Patient has a walker, cane, wheelchair, bsc (doesn't use) and a shower chair at home.  Patient reports she goes to the [facility] in [place] to have her labs drawn fro her coumadin.  Patient has three sons.  One son lives 10-15 minuets away from her, one in [place] and one son in [place].  EDCM placed text to Erasmo Downer, transition specilaist of [facility] to let her know patient was being admitted.  No further CM needs at this time.

## 2013-08-06 NOTE — ED Provider Notes (Signed)
CSN: [id number]     Arrival date & time 08/06/13  1017 History   First MD Initiated Contact with Patient 08/06/13 1028     Chief Complaint  Patient presents with  . Abnormal Lab   (Consider location/radiation/quality/duration/timing/severity/associated sxs/prior Treatment) HPI Comments: PT with hx of afib, CHF, iddm comes in with cc of abnormal labs. Pt is on coumadin, and her inr came back over 15. She deneis any falls, no bloody stools or bloody urine. Pt was taking antibiotics for a UTI, but has been off of it for a week now. No hx of similar episodes in the past.  The history is provided by the patient.    Past Medical History  Diagnosis Date  . Atrial fibrillation   . GERD (gastroesophageal reflux disease)   . Hyperlipidemia   . Hypertension   . Hypothyroid   . Obesity   . Vaginal dysplasia 1980  . Rosacea   . Chest pain 11/2002    cardiac cath neg   . Back pain   . IBS (irritable bowel syndrome)     with diarrhea  . Asthma     as a child  . Adenomatous colon polyp 12/2007  . Hemorrhoids   . Vertigo   . Hiatal hernia 12/2007    EGD  . CHF (congestive heart failure)   . Complication of anesthesia     "dr said I should never be put to sleep again" (03/07/2013)  . Exertional shortness of breath   . Type II diabetes mellitus   . Iron deficiency anemia   . Arthritis     "all over my body" (03/07/2013)  . Vaginal cancer   . History of cerebrovascular disease 04/24/2013  . Abnormality of gait 04/24/2013   Past Surgical History  Procedure Laterality Date  . Cholecystectomy  2006  . Joint replacement      Rt TKR [place] 2001  . Cervical cone biopsy  1963    D&C  . Cystocele repair  1978    /rectocele  . Orif ankle fracture Right 2006  . Total knee arthroplasty Right 1993  . Total knee arthroplasty Left 2001  . Cataract extraction w/ intraocular lens  implant, bilateral Bilateral 2006  . Ankle fracture surgery Right 2011    fall/followed by rehab stay; "hand to  have 9 pins put in it" (03/07/2013)  . Cardiac catheterization  11/2002    Archie Endo 12/11/2002 (03/07/2013)  . Appendectomy  1941    Archie Endo 11/17/1999 (03/07/2013)  . Abdominal hysterectomy  1963    partial/notes 11/17/1999 (03/07/2013   Family History  Problem Relation Age of Onset  . Cancer Mother     leukemia  . Coronary artery disease Mother   . Dementia Mother   . Heart disease Father     CAD  . COPD Sister   . Heart disease Brother   . Diabetes Brother   . Kidney disease Son   . Colon cancer Brother    History  Substance Use Topics  . Smoking status: Former Smoker -- 1.00 packs/day for 40 years    Types: Cigarettes  . Smokeless tobacco: Never Used     Comment: 03/07/2013 "quit smoking 35 years ago"  . Alcohol Use: No   OB History   Grav Para Term Preterm Abortions TAB SAB Ect Mult Living                 Review of Systems  Constitutional: Negative for activity change.  HENT:  Negative for nosebleeds.   Respiratory: Negative for shortness of breath.   Cardiovascular: Negative for chest pain.  Gastrointestinal: Negative for blood in stool and anal bleeding.  Genitourinary: Negative for hematuria.  Skin: Negative for wound.  Hematological: Bruises/bleeds easily.    Allergies  Ciprofloxacin; Gabapentin; Insulin glargine; Latex; Metformin and related; Metoclopramide hcl; Nsaids; and Promethazine hcl  Home Medications   Current Outpatient Rx  Name  Route  Sig  Dispense  Refill  . acetaminophen (TYLENOL) 500 MG tablet   Oral   Take 500 mg by mouth every 6 (six) hours as needed for mild pain.          . BD PEN NEEDLE NANO U/F 32G X 4 MM MISC      USE AS DIRECTED WITH INSULIN PEN   100 each   1   . cefUROXime (CEFTIN) 250 MG tablet   Oral   Take 1 tablet (250 mg total) by mouth 2 (two) times daily with a meal.   10 tablet   0   . fluocinonide cream (LIDEX) 0.05 %   Topical   Apply 1 application topically 2 (two) times daily as needed (For sores on body.).           Marland Kitchen furosemide (LASIX) 40 MG tablet   Oral   Take 20 mg by mouth every morning.         Marland Kitchen glucose blood (ONE TOUCH ULTRA TEST) test strip      Check sugars 2x a day   200 each   11   . hydrocortisone cream 1 %   Topical   Apply 1 application topically 2 (two) times daily as needed for itching.         . hydrOXYzine (ATARAX/VISTARIL) 10 MG tablet   Oral   Take 10-30 mg by mouth at bedtime as needed for itching.         . Insulin Isophane & Regular (HUMULIN 70/30 [place])   Subcutaneous   Inject 42-50 Units into the skin 2 (two) times daily before a meal. She takes 50 units before breakfast and 42 units before dinner.         . Insulin Syringe-Needle U-100 (BD INSULIN SYRINGE ULTRAFINE) 31G X 15/64" 1 ML MISC   Does not apply   1 each by Does not apply route every morning. Use 2 to 3 times daily as directed.   100 each   5   . levothyroxine (SYNTHROID, LEVOTHROID) 150 MCG tablet   Oral   Take 150 mcg by mouth daily before breakfast.         . loperamide (ANTI-DIARRHEAL) 2 MG capsule   Oral   Take 2-4 mg by mouth 4 (four) times daily as needed for diarrhea or loose stools.         . methotrexate 2.5 MG tablet   Oral   Take 15 mg by mouth every Friday.          . metoprolol (LOPRESSOR) 50 MG tablet   Oral   Take 25 mg by mouth 2 (two) times daily.         Marland Kitchen omeprazole (PRILOSEC) 20 MG capsule   Oral   Take 20 mg by mouth at bedtime.         . potassium chloride SA (K-DUR,KLOR-CON) 20 MEQ tablet   Oral   Take 20 mEq by mouth every morning.         . silver sulfADIAZINE (SILVADENE) 1 % cream  Topical   Apply 1 application topically daily as needed (Applies to body.).          Marland Kitchen simvastatin (ZOCOR) 40 MG tablet   Oral   Take 1 tablet (40 mg total) by mouth at bedtime.   90 tablet   1   . traMADol (ULTRAM) 50 MG tablet   Oral   Take 50-100 mg by mouth 3 (three) times daily as needed for severe pain.          Marland Kitchen warfarin (COUMADIN) 5 MG  tablet   Oral   Take 2.5-5 mg by mouth at bedtime. She takes one tablet on Monday and half a tablet the rest of the week.          BP 113/66  Pulse 95  Temp(Src) 98 F (36.7 C) (Oral)  Resp 20  SpO2 99%  LMP 09/01/1961 Physical Exam  Nursing note and vitals reviewed. Constitutional: She is oriented to person, place, and time. She appears well-developed.  HENT:  Head: Normocephalic and atraumatic.  Eyes: Conjunctivae are normal.  Neck: Neck supple.  Cardiovascular: Normal rate.   Pulmonary/Chest: Effort normal. No respiratory distress.  Abdominal: Soft. She exhibits no distension. There is no tenderness.  Neurological: She is alert and oriented to person, place, and time.    ED Course  Procedures (including critical care time) Labs Review Labs Reviewed  CBC WITH DIFFERENTIAL - Abnormal; Notable for the following:    RBC 3.76 (*)    Hemoglobin 11.4 (*)    HCT 34.4 (*)    RDW 16.6 (*)    Basophils Relative 2 (*)    All other components within normal limits  BASIC METABOLIC PANEL - Abnormal; Notable for the following:    Glucose, Bld 282 (*)    Creatinine, Ser 1.26 (*)    GFR calc non Af Amer 37 (*)    GFR calc Af Amer 43 (*)    All other components within normal limits  PROTIME-INR - Abnormal; Notable for the following:    Prothrombin Time 61.9 (*)    INR 7.72 (*)    All other components within normal limits  APTT - Abnormal; Notable for the following:    aPTT 95 (*)    All other components within normal limits   Imaging Review No results found.  EKG Interpretation   None       MDM  No diagnosis found.  Pt comes in with elevated inr. Titus Dubin it was > 15, today it is 7.72. No active bleeding. Will admit for observation and med optimization. Not sure right now why she had her inr get dangerously high.  Varney Biles, MD 08/06/13 1423

## 2013-08-06 NOTE — ED Notes (Signed)
Pt's sonRevonda Humphrey [phone]

## 2013-08-06 NOTE — Telephone Encounter (Signed)
I see she did go in epic-waiting on notes-thanks!

## 2013-08-06 NOTE — ED Notes (Addendum)
Pt was sent by PCP due to dangerously thin blood. Pt was told to come last night but did not come. Levels were drew yesterday and came back last night. Pt states she feels fine but son states pt is very weak.

## 2013-08-06 NOTE — H&P (Signed)
[facility] History and Physical  [name] FBX:[id number] DOB: 12-Mar-1923 DOA: 08/06/2013  Referring physician:  PCP: Loura Pardon, MD  Specialists:   Chief Complaint: Supratherapeutic INR  HPI: [name] is a 80 y.o. [facility] PMHx atrial fibrillation (on Coumadin), HLD, HTN, hypothyroid, irritable bowel syndrome, asthma, CHF, chronic renal insufficiency, diabetes type 2 (uncontrolled) 03/07/2013 hemoglobin A1c= 9.3, Hx CVA, iron deficiency anemia. She was recently admitted on 07/23/2013 for altered mental status secondary to UTI treated with ceftriaxone.   Review of Systems: The patient denies anorexia, fever, weight loss,, vision loss, decreased hearing, hoarseness, chest pain, syncope, dyspnea on exertion, peripheral edema, balance deficits, hemoptysis, abdominal pain, melena, hematochezia, severe indigestion/heartburn, hematuria, incontinence, genital sores, muscle weakness, suspicious, transient blindness, difficulty walking, depression, unusual weight change, abnormal bleeding, enlarged lymph nodes, angioedema, and breast masses.    TRAVEL HISTORY: none   Procedure 06/18/2012 echocardiogram - Left ventricle: The cavity size was normal. Systolic function was normal.  -LVEF= 60% to 65%.  - Aortic valve: Valve mobility was restricted. There was mild stenosis by velocities.  - Mitral valve: Calcified annulus. - Right ventricle: The cavity size was mildly dilated. - Right atrium: The atrium was moderately dilated. - Pulmonary arteries: Systolic pressure was moderately increased. PA peak pressure: 68mm Hg (S).    Antibiotics    Past Medical History  Diagnosis Date  . Atrial fibrillation   . GERD (gastroesophageal reflux disease)   . Hyperlipidemia   . Hypertension   . Hypothyroid   . Obesity   . Vaginal dysplasia 1980  . Rosacea   . Chest pain 11/2002    cardiac cath neg   . Back pain   . IBS (irritable bowel syndrome)     with diarrhea  . Asthma      as a child  . Adenomatous colon polyp 12/2007  . Hemorrhoids   . Vertigo   . Hiatal hernia 12/2007    EGD  . CHF (congestive heart failure)   . Complication of anesthesia     "dr said I should never be put to sleep again" (03/07/2013)  . Exertional shortness of breath   . Type II diabetes mellitus   . Iron deficiency anemia   . Arthritis     "all over my body" (03/07/2013)  . Vaginal cancer   . History of cerebrovascular disease 04/24/2013  . Abnormality of gait 04/24/2013   Past Surgical History  Procedure Laterality Date  . Cholecystectomy  2006  . Joint replacement      Rt TKR [place] 2001  . Cervical cone biopsy  1963    D&C  . Cystocele repair  1978    /rectocele  . Orif ankle fracture Right 2006  . Total knee arthroplasty Right 1993  . Total knee arthroplasty Left 2001  . Cataract extraction w/ intraocular lens  implant, bilateral Bilateral 2006  . Ankle fracture surgery Right 2011    fall/followed by rehab stay; "hand to have 9 pins put in it" (03/07/2013)  . Cardiac catheterization  11/2002    Archie Endo 12/11/2002 (03/07/2013)  . Appendectomy  1941    Archie Endo 11/17/1999 (03/07/2013)  . Abdominal hysterectomy  1963    partial/notes 11/17/1999 (03/07/2013   Social History:  reports that she has quit smoking. Her smoking use included Cigarettes. She has a 40 pack-year smoking history. She has never used smokeless tobacco. She reports that she does not drink alcohol or use illicit drugs. where does patient live--home, ALF,  SNF? At home alone Can patient participate in ADLs? Yes  Allergies  Allergen Reactions  . Ciprofloxacin     Nausea Possibly rash and itching- but not sure   . Gabapentin Nausea And Vomiting  . Insulin Glargine     Itching   . Latex     Rash   . Metformin And Related     Severe diarrhea   . Metoclopramide Hcl Nausea And Vomiting  . Nsaids Nausea And Vomiting  . Promethazine Hcl Other (See Comments)    Reaction unknown    Family History  Problem  Relation Age of Onset  . Cancer Mother     leukemia  . Coronary artery disease Mother   . Dementia Mother   . Heart disease Father     CAD  . COPD Sister   . Heart disease Brother   . Diabetes Brother   . Kidney disease Son   . Colon cancer Brother      Prior to Admission medications   Medication Sig Start Date End Date Taking? Authorizing Provider  acetaminophen (TYLENOL) 500 MG tablet Take 500 mg by mouth every 6 (six) hours as needed for mild pain.    Yes Historical Provider, MD  BD PEN NEEDLE NANO U/F 32G X 4 MM MISC USE AS DIRECTED WITH INSULIN PEN 01/16/13  Yes Abner Greenspan, MD  cefUROXime (CEFTIN) 250 MG tablet Take 1 tablet (250 mg total) by mouth 2 (two) times daily with a meal. 07/28/13  Yes Theodis Blaze, MD  fluocinonide cream (LIDEX) [id number] % Apply 1 application topically 2 (two) times daily as needed (For sores on body.).    Yes Historical Provider, MD  furosemide (LASIX) 40 MG tablet Take 20 mg by mouth every morning.   Yes Historical Provider, MD  glucose blood (ONE TOUCH ULTRA TEST) test strip Check sugars 2x a day 02/14/13  Yes Philemon Kingdom, MD  hydrocortisone cream 1 % Apply 1 application topically 2 (two) times daily as needed for itching.   Yes Historical Provider, MD  hydrOXYzine (ATARAX/VISTARIL) 10 MG tablet Take 10-30 mg by mouth at bedtime as needed for itching.   Yes Historical Provider, MD  Insulin Isophane & Regular (HUMULIN 70/30 [place]) Inject 42-50 Units into the skin 2 (two) times daily before a meal. She takes 50 units before breakfast and 42 units before dinner.   Yes Historical Provider, MD  Insulin Syringe-Needle U-100 (BD INSULIN SYRINGE ULTRAFINE) 31G X 15/64" 1 ML MISC 1 each by Does not apply route every morning. Use 2 to 3 times daily as directed. 05/22/13  Yes Philemon Kingdom, MD  levothyroxine (SYNTHROID, LEVOTHROID) 150 MCG tablet Take 150 mcg by mouth daily before breakfast.   Yes Historical Provider, MD  loperamide (ANTI-DIARRHEAL) 2 MG capsule  Take 2-4 mg by mouth 4 (four) times daily as needed for diarrhea or loose stools.   Yes Historical Provider, MD  methotrexate 2.5 MG tablet Take 15 mg by mouth every Friday.    Yes Historical Provider, MD  metoprolol (LOPRESSOR) 50 MG tablet Take 25 mg by mouth 2 (two) times daily.   Yes Historical Provider, MD  omeprazole (PRILOSEC) 20 MG capsule Take 20 mg by mouth at bedtime.   Yes Historical Provider, MD  potassium chloride SA (K-DUR,KLOR-CON) 20 MEQ tablet Take 20 mEq by mouth every morning.   Yes Historical Provider, MD  silver sulfADIAZINE (SILVADENE) 1 % cream Apply 1 application topically daily as needed (Applies to body.).    Yes  Historical Provider, MD  simvastatin (ZOCOR) 40 MG tablet Take 1 tablet (40 mg total) by mouth at bedtime. 11/15/12 11/15/13 Yes Abner Greenspan, MD  traMADol (ULTRAM) 50 MG tablet Take 50-100 mg by mouth 3 (three) times daily as needed for severe pain.    Yes Historical Provider, MD  warfarin (COUMADIN) 5 MG tablet Take 2.5-5 mg by mouth at bedtime. She takes one tablet on Monday and half a tablet the rest of the week. 02/19/13  Yes Abner Greenspan, MD   Physical Exam: Filed Vitals:   08/06/13 1031  BP: 113/66  Pulse: 95  Temp: 98 F (36.7 C)  TempSrc: Oral  Resp: 20  SpO2: 99%     General:  A./O. x4, NAD  Eyes: He was equal reactive to light and accommodation  Neck: Negative JVD, negative adenopathy  Cardiovascular: Irregular irregular with a rate, negative murmurs rubs or gallops, DP/PT pulse one plus  Respiratory: Positive diffuse expiratory wheezing mild  Abdomen: Elderly obese, soft, nontender, plus bowel  Skin: Patient on this and not covered with lesions in different states of healing (pemphigoid which patient has been scratching) negative sign of infection  Musculoskeletal: A lateral pedal edema 1-2+ to mid shin  Neurologic: Cranial nerve II through XII intact, extremity strength 5/5, sensation intact throughout  Labs on Admission:   Basic Metabolic Panel:  Recent Labs Lab 08/05/13 1318 08/06/13 1146  NA 140 140  K 3.6 3.7  CL 106 102  CO2 23 23  GLUCOSE 308* 282*  BUN 21 22  CREATININE 1.4* 1.26*  CALCIUM 7.9* 8.4   Liver Function Tests: No results found for this basename: AST, ALT, ALKPHOS, BILITOT, PROT, ALBUMIN,  in the last 168 hours No results found for this basename: LIPASE, AMYLASE,  in the last 168 hours No results found for this basename: AMMONIA,  in the last 168 hours CBC:  Recent Labs Lab 08/05/13 1318 08/06/13 1146  WBC 6.0 6.8  NEUTROABS 4.0 5.1  HGB 10.9* 11.4*  HCT 33.7* 34.4*  MCV 92.6 91.5  PLT 343.0 366   Cardiac Enzymes: No results found for this basename: CKTOTAL, CKMB, CKMBINDEX, TROPONINI,  in the last 168 hours  BNP (last 3 results)  Recent Labs  03/07/13 1820  PROBNP 7836.0*   CBG: No results found for this basename: GLUCAP,  in the last 168 hours  Radiological Exams on Admission: No results found.  EKG: N./A  Assessment/Plan Active Problems:   HYPERLIPIDEMIA   HYPERTENSION   Atrial fibrillation   Irritable bowel syndrome   Rosacea   DM type 2 with diabetic peripheral neuropathy   Anemia   History of cerebrovascular disease   Supratherapeutic INR   Pulmonary hypertension  A-Fib on chronic anticoagulation -Current INR 7.7, patient received 2.5 mg vitamin K  Supratherapeutic INR -See A. fib  HTN -Slightly above ADA guidelines however given patient's age would not attend to lower further -Patient not on ACE/ARB. secondary to poor renal function  HLD -Obtain lipid panel -Continue home meds for now  Diabetes type 2 with peripheral neuropathy -Obtain hemoglobin A1c -Manage CBG with moderate SSI while in the hospital -Make adjustments to patient's medication prior to discharge  Bullous pemphigoid -Continue home medication  Renal insufficiency -Avoid all nephrotoxic medication    Code Status: Full Family Communication: No family  available Disposition Plan: Resolution of supratherapeutic state  Time spent: 60 minutes WOODS, Geraldo Docker [facility] Pager [phone]  If 7PM-7AM, please contact night-coverage [email or web address] Password [facility] 08/06/2013,  3:53 PM

## 2013-08-06 NOTE — Telephone Encounter (Signed)
[facility] Triage Call Report Triage Record Num: [id number] Operator: Buford Dresser Patient Name: [name] Call Date & Time: 08/05/2013 6:14:09PM Patient Phone: [phone] PCP: Wynelle Fanny. Tower Patient Gender: Female PCP Fax : Patient DOB: August 23, 1922 Practice Name: [facility] Reason for Call: Caller: [name] from [place]; PCP: Loura Pardon [facility]); CB#: [id number]; Santiago Glad calling from [employer] regarding a PT/INR ordered by [employer], Roque Lias [facility]). Critical PT/INR 154/15.4--has been repeated and verified. Called Dr Gilford Rile to report critical lab--she advised to send pt to ED. Called pt and advised her to go to ED but she adamantly refused. She stated that she just got out of the hospital and refuses to go back, she states that she feels ok and seems to be lucid. Advised pt to NOT take Coumadin tonight and call office in am to follow-up. Contacted Dr Gilford Rile to inform her that pt refused to go to ED. Protocol(s) Used: PCP Calls, No Triage (Adult) Recommended Outcome per Protocol: Call Provider within 24 Hours Override Outcome if Used in Protocol: Call Provider Immediately RN Reason for Override Outcome: Physician Orders. Reason for Outcome: Lab calling with test results Care Advice: ~ 08/05/2013 6:40:57PM Page 1 of 1 [email or web address]

## 2013-08-07 DIAGNOSIS — E119 Type 2 diabetes mellitus without complications: Secondary | ICD-10-CM

## 2013-08-07 LAB — COMPREHENSIVE METABOLIC PANEL
ALT: 47 U/L — ABNORMAL HIGH (ref 0–35)
AST: 64 U/L — AB (ref 0–37)
Albumin: 2.5 g/dL — ABNORMAL LOW (ref 3.5–5.2)
Alkaline Phosphatase: 272 U/L — ABNORMAL HIGH (ref 39–117)
BUN: 21 mg/dL (ref 6–23)
CALCIUM: 8 mg/dL — AB (ref 8.4–10.5)
CO2: 21 mEq/L (ref 19–32)
Chloride: 107 mEq/L (ref 96–112)
Creatinine, Ser: 1.33 mg/dL — ABNORMAL HIGH (ref 0.50–1.10)
GFR calc Af Amer: 40 mL/min — ABNORMAL LOW (ref >90)
GFR calc non Af Amer: 35 mL/min — ABNORMAL LOW (ref >90)
Glucose, Bld: 220 mg/dL — ABNORMAL HIGH (ref 70–99)
Potassium: 3.4 mEq/L — ABNORMAL LOW (ref 3.7–5.3)
SODIUM: 143 meq/L (ref 137–147)
Total Bilirubin: 6.8 mg/dL — ABNORMAL HIGH (ref 0.3–1.2)
Total Protein: 6.4 g/dL (ref 6.0–8.3)

## 2013-08-07 LAB — CBC WITH DIFFERENTIAL/PLATELET
Basophils Absolute: 0.1 10*3/uL (ref 0.0–0.1)
Basophils Relative: 2 % — ABNORMAL HIGH (ref 0–1)
EOS ABS: 0.4 10*3/uL (ref 0.0–0.7)
EOS PCT: 6 % — AB (ref 0–5)
HCT: 31.4 % — ABNORMAL LOW (ref 36.0–46.0)
HEMOGLOBIN: 10.4 g/dL — AB (ref 12.0–15.0)
Lymphocytes Relative: 22 % (ref 12–46)
Lymphs Abs: 1.4 10*3/uL (ref 0.7–4.0)
MCH: 30.2 pg (ref 26.0–34.0)
MCHC: 33.1 g/dL (ref 30.0–36.0)
MCV: 91.3 fL (ref 78.0–100.0)
Monocytes Absolute: 0.4 10*3/uL (ref 0.1–1.0)
Monocytes Relative: 6 % (ref 3–12)
Neutro Abs: 4.1 10*3/uL (ref 1.7–7.7)
Neutrophils Relative %: 64 % (ref 43–77)
Platelets: 348 10*3/uL (ref 150–400)
RBC: 3.44 MIL/uL — AB (ref 3.87–5.11)
RDW: 16.7 % — ABNORMAL HIGH (ref 11.5–15.5)
WBC: 6.3 10*3/uL (ref 4.0–10.5)

## 2013-08-07 LAB — GLUCOSE, CAPILLARY
GLUCOSE-CAPILLARY: 238 mg/dL — AB (ref 70–99)
GLUCOSE-CAPILLARY: 250 mg/dL — AB (ref 70–99)
GLUCOSE-CAPILLARY: 285 mg/dL — AB (ref 70–99)
GLUCOSE-CAPILLARY: 322 mg/dL — AB (ref 70–99)
Glucose-Capillary: 215 mg/dL — ABNORMAL HIGH (ref 70–99)
Glucose-Capillary: 185 mg/dL — ABNORMAL HIGH (ref 70–99)
Glucose-Capillary: 355 mg/dL — ABNORMAL HIGH (ref 70–99)

## 2013-08-07 LAB — MAGNESIUM: Magnesium: 1 mg/dL — ABNORMAL LOW (ref 1.5–2.5)

## 2013-08-07 LAB — PROTIME-INR
INR: 8.72 — AB (ref 0.00–1.49)
Prothrombin Time: 67.9 seconds — ABNORMAL HIGH (ref 11.6–15.2)

## 2013-08-07 MED ORDER — WARFARIN - PHARMACIST DOSING INPATIENT: Freq: Every day | Status: DC

## 2013-08-07 NOTE — Progress Notes (Addendum)
ANTICOAGULATION CONSULT NOTE - Initial Consult  Pharmacy Consult for Coumdin Indication: atrial fibrillation  Allergies  Allergen Reactions  . Ciprofloxacin     Nausea Possibly rash and itching- but not sure   . Gabapentin Nausea And Vomiting  . Insulin Glargine     Itching   . Latex     Rash   . Metformin And Related     Severe diarrhea   . Metoclopramide Hcl Nausea And Vomiting  . Nsaids Nausea And Vomiting  . Promethazine Hcl Other (See Comments)    Reaction unknown    Patient Measurements: Height: 5\' 2"  (157.5 cm) Weight: 244 lb 8 oz (110.904 kg) IBW/kg (Calculated) : 50.1  Vital Signs: Temp: 98 F (36.7 C) (01/07 0520) Temp src: Oral (01/07 0520) BP: 130/67 mmHg (01/07 0520) Pulse Rate: 84 (01/07 0520)  Labs:  Recent Labs  08/05/13 1318 08/06/13 1146 08/07/13 0545 08/07/13 1000  HGB 10.9* 11.4* 10.4*  --   HCT 33.7* 34.4* 31.4*  --   PLT 343.0 366 348  --   APTT  --  95*  --   --   LABPROT 154.2 Repeated and verified X2.* 61.9*  --  67.9*  INR 15.4* 7.72*  --  8.72*  CREATININE 1.4* 1.26* 1.33*  --     Estimated Creatinine Clearance: 34.3 ml/min (by C-G formula based on Cr of 1.33).   Medical History: Past Medical History  Diagnosis Date  . Atrial fibrillation   . GERD (gastroesophageal reflux disease)   . Hyperlipidemia   . Hypertension   . Hypothyroid   . Obesity   . Vaginal dysplasia 1980  . Rosacea   . Chest pain 11/2002    cardiac cath neg   . Back pain   . IBS (irritable bowel syndrome)     with diarrhea  . Asthma     as a child  . Adenomatous colon polyp 12/2007  . Hemorrhoids   . Vertigo   . Hiatal hernia 12/2007    EGD  . CHF (congestive heart failure)   . Complication of anesthesia     "dr said I should never be put to sleep again" (03/07/2013)  . Exertional shortness of breath   . Type II diabetes mellitus   . Iron deficiency anemia   . Arthritis     "all over my body" (03/07/2013)  . Vaginal cancer   . History of  cerebrovascular disease 04/24/2013  . Abnormality of gait 04/24/2013    Medications:  Prescriptions prior to admission  Medication Sig Dispense Refill  . acetaminophen (TYLENOL) 500 MG tablet Take 500 mg by mouth every 6 (six) hours as needed for mild pain.       . BD PEN NEEDLE NANO U/F 32G X 4 MM MISC USE AS DIRECTED WITH INSULIN PEN  100 each  1  . cefUROXime (CEFTIN) 250 MG tablet Take 1 tablet (250 mg total) by mouth 2 (two) times daily with a meal.  10 tablet  0  . fluocinonide cream (LIDEX) 0.25 % Apply 1 application topically 2 (two) times daily as needed (For sores on body.).       Marland Kitchen furosemide (LASIX) 40 MG tablet Take 20 mg by mouth every morning.      Marland Kitchen glucose blood (ONE TOUCH ULTRA TEST) test strip Check sugars 2x a day  200 each  11  . hydrocortisone cream 1 % Apply 1 application topically 2 (two) times daily as needed for itching.      Marland Kitchen  hydrOXYzine (ATARAX/VISTARIL) 10 MG tablet Take 10-30 mg by mouth at bedtime as needed for itching.      . Insulin Isophane & Regular (HUMULIN 70/30 [place]) Inject 42-50 Units into the skin 2 (two) times daily before a meal. She takes 50 units before breakfast and 42 units before dinner.      . Insulin Syringe-Needle U-100 (BD INSULIN SYRINGE ULTRAFINE) 31G X 15/64" 1 ML MISC 1 each by Does not apply route every morning. Use 2 to 3 times daily as directed.  100 each  5  . levothyroxine (SYNTHROID, LEVOTHROID) 150 MCG tablet Take 150 mcg by mouth daily before breakfast.      . loperamide (ANTI-DIARRHEAL) 2 MG capsule Take 2-4 mg by mouth 4 (four) times daily as needed for diarrhea or loose stools.      . methotrexate 2.5 MG tablet Take 15 mg by mouth every Friday.       . metoprolol (LOPRESSOR) 50 MG tablet Take 25 mg by mouth 2 (two) times daily.      Marland Kitchen omeprazole (PRILOSEC) 20 MG capsule Take 20 mg by mouth at bedtime.      . potassium chloride SA (K-DUR,KLOR-CON) 20 MEQ tablet Take 20 mEq by mouth every morning.      . silver sulfADIAZINE  (SILVADENE) 1 % cream Apply 1 application topically daily as needed (Applies to body.).       Marland Kitchen simvastatin (ZOCOR) 40 MG tablet Take 1 tablet (40 mg total) by mouth at bedtime.  90 tablet  1  . traMADol (ULTRAM) 50 MG tablet Take 50-100 mg by mouth 3 (three) times daily as needed for severe pain.       Marland Kitchen warfarin (COUMADIN) 5 MG tablet Take 2.5-5 mg by mouth at bedtime. She takes one tablet on Monday and half a tablet the rest of the week.        Assessment: 80 yo F on chronic Coumadin for Afib, CKD, and recent hospitalization for UTI tx with CTX-->cefuroxime, admitted 1/6 from PCP office d/t supratherapeutic INR.  Vit.K 2.5mg  PO given on 1/6 but INR is slightly higher this AM - no further vit.K ordered by MD.   CBC stable. No bleeding reported/documented.  Goal of Therapy:  INR 2-3 Monitor platelets by anticoagulation protocol: Yes   Plan:   Cont to hold Coumadin. Resume when appropriate to maintain INR in goal range.  Check PT/INR daily.  Romeo Rabon, PharmD, pager [phone]. 08/07/2013,10:53 AM.

## 2013-08-07 NOTE — Progress Notes (Signed)
Received a call from the lab with INR of 8.72, MD paged and notified. No new orders at this time. Will continue to monitor.

## 2013-08-07 NOTE — Progress Notes (Signed)
[facility] PROGRESS NOTE  [name] EZM:[id number] DOB: February 04, 1923 DOA: 08/06/2013 PCP: Loura Pardon, MD  Assessment/Plan: A-Fib on chronic anticoagulation  -Presented with INR of over 15 s/p 2.5 mg vitamin K, with INR down to 7.7 initially, now back up to over 8. - No signs of active bleeding - Pharmacy consulted  - presently rate controlled Supratherapeutic INR  -See above HTN  -Stable -Cont current regimen -Patient not on ACE/ARB. secondary to poor renal function  HLD  -Obtain lipid panel  -Continue home meds for now  Diabetes type 2 with peripheral neuropathy  -hemoglobin A1c of 8.3 -Manage CBG with moderate SSI while in the hospital  -Make adjustments to patient's medication prior to discharge  Bullous pemphigoid  -Continue home medication  Renal insufficiency  -Avoid all nephrotoxic medication  Code Status: Full Family Communication: Pt in room (indicate person spoken with, relationship, and if by phone, the number) Disposition Plan: Pending   HPI/Subjective: No acute events. Eager to go home  Objective: Filed Vitals:   08/06/13 1720 08/06/13 2200 08/07/13 0520 08/07/13 1058  BP: 149/74 147/74 130/67 158/84  Pulse: 84 85 84 75  Temp: 98.2 F (36.8 C) 97.5 F (36.4 C) 98 F (36.7 C) 98.4 F (36.9 C)  TempSrc: Oral Oral Oral   Resp: 22 20 18 18   Height: 5\' 2"  (1.575 m)     Weight: 110.904 kg (244 lb 8 oz)     SpO2: 96% 98% 96%     Intake/Output Summary (Last 24 hours) at 08/07/13 1141 Last data filed at 08/07/13 0900  Gross per 24 hour  Intake   1560 ml  Output    300 ml  Net   1260 ml   Filed Weights   08/06/13 1720  Weight: 110.904 kg (244 lb 8 oz)    Exam:   General:  Awake, in nad  Cardiovascular: regular, s1, s2  Respiratory: normal resp effort, no wheezing  Abdomen: soft, nondistended  Musculoskeletal: perfused, no clubbing   Data Reviewed: Basic Metabolic Panel:  Recent Labs Lab 08/05/13 1318 08/06/13 1146  08/07/13 0545  NA 140 140 143  K 3.6 3.7 3.4*  CL 106 102 107  CO2 23 23 21   GLUCOSE 308* 282* 220*  BUN 21 22 21   CREATININE 1.4* 1.26* 1.33*  CALCIUM 7.9* 8.4 8.0*  MG  --   --  1.0*   Liver Function Tests:  Recent Labs Lab 08/07/13 0545  AST 64*  ALT 47*  ALKPHOS 272*  BILITOT 6.8*  PROT 6.4  ALBUMIN 2.5*   No results found for this basename: LIPASE, AMYLASE,  in the last 168 hours No results found for this basename: AMMONIA,  in the last 168 hours CBC:  Recent Labs Lab 08/05/13 1318 08/06/13 1146 08/07/13 0545  WBC 6.0 6.8 6.3  NEUTROABS 4.0 5.1 4.1  HGB 10.9* 11.4* 10.4*  HCT 33.7* 34.4* 31.4*  MCV 92.6 91.5 91.3  PLT 343.0 366 348   Cardiac Enzymes: No results found for this basename: CKTOTAL, CKMB, CKMBINDEX, TROPONINI,  in the last 168 hours BNP (last 3 results)  Recent Labs  03/07/13 1820  PROBNP 7836.0*   CBG:  Recent Labs Lab 08/06/13 2029 08/07/13 0039 08/07/13 0409 08/07/13 0749  GLUCAP 233* 250* 215* 185*    No results found for this or any previous visit (from the past 240 hour(s)).   Studies: No results found.  Scheduled Meds: . furosemide  20 mg Oral q morning - 10a  .  insulin aspart  0-15 Units Subcutaneous Q4H  . levothyroxine  150 mcg Oral QAC breakfast  . metoprolol  25 mg Oral BID  . pantoprazole  40 mg Oral Daily  . potassium chloride SA  20 mEq Oral q morning - 10a  . simvastatin  40 mg Oral QHS  . Warfarin - Pharmacist Dosing Inpatient   Does not apply q1800   Continuous Infusions: . sodium chloride 100 mL/hr at 08/07/13 0419    Active Problems:   HYPERLIPIDEMIA   HYPERTENSION   Atrial fibrillation   Irritable bowel syndrome   Rosacea   DM type 2 with diabetic peripheral neuropathy   Anemia   History of cerebrovascular disease   Supratherapeutic INR   Pulmonary hypertension   Time spent: 32min   Eldean Klatt, [facility] Pager [phone]. If 7PM-7AM, please contact night-coverage at  [email or web address], password [facility] 08/07/2013, 11:41 AM  LOS: 1 day

## 2013-08-07 NOTE — Addendum Note (Signed)
Addended by: Ellamae Sia on: 08/07/2013 05:40 PM   Modules accepted: Orders

## 2013-08-07 NOTE — Progress Notes (Signed)
Inpatient Diabetes Program Recommendations  AACE/ADA: New Consensus Statement on Inpatient Glycemic Control (2013)  Target Ranges:  Prepandial:   less than 140 mg/dL      Peak postprandial:   less than 180 mg/dL (1-2 hours)      Critically ill patients:  140 - 180 mg/dL   Reason for Visit: Hyperglycemia   Results for [name], [name] (MRN [id number]) as of 08/07/2013 13:26  Ref. Range 08/06/2013 20:29 08/07/2013 00:39 08/07/2013 04:09 08/07/2013 07:49 08/07/2013 12:20  Glucose-Capillary Latest Range: 70-99 mg/dL 233 (H) 250 (H) 215 (H) 185 (H) 285 (H)  Results for [name], [name] (MRN [id number]) as of 08/07/2013 13:26  Ref. Range 08/06/2013 18:34  Hemoglobin A1C Latest Range: <5.7 % 8.3 (H)    On 70/30 50 units in am and 42 units in pm.  Needs basal insulin.  Recommendations: Add Lantus 20 units QHS. Add meal coverage insulin - Novolog 4 units tidwc if pt eats >50% meal.  Thank you. Lorenda Peck, RD, LDN, CDE Inpatient Diabetes Coordinator [phone]

## 2013-08-07 NOTE — Progress Notes (Signed)
UR completed 

## 2013-08-08 ENCOUNTER — Telehealth: Payer: Self-pay

## 2013-08-08 LAB — COMPREHENSIVE METABOLIC PANEL
ALBUMIN: 2.6 g/dL — AB (ref 3.5–5.2)
ALT: 46 U/L — ABNORMAL HIGH (ref 0–35)
AST: 61 U/L — ABNORMAL HIGH (ref 0–37)
Alkaline Phosphatase: 302 U/L — ABNORMAL HIGH (ref 39–117)
BUN: 20 mg/dL (ref 6–23)
CO2: 22 mEq/L (ref 19–32)
CREATININE: 1.25 mg/dL — AB (ref 0.50–1.10)
Calcium: 7.9 mg/dL — ABNORMAL LOW (ref 8.4–10.5)
Chloride: 105 mEq/L (ref 96–112)
GFR calc non Af Amer: 37 mL/min — ABNORMAL LOW (ref >90)
GFR, EST AFRICAN AMERICAN: 43 mL/min — AB (ref >90)
GLUCOSE: 200 mg/dL — AB (ref 70–99)
Potassium: 3.3 mEq/L — ABNORMAL LOW (ref 3.7–5.3)
Sodium: 141 mEq/L (ref 137–147)
TOTAL PROTEIN: 6.8 g/dL (ref 6.0–8.3)
Total Bilirubin: 8.2 mg/dL — ABNORMAL HIGH (ref 0.3–1.2)

## 2013-08-08 LAB — CBC WITH DIFFERENTIAL/PLATELET
BASOS ABS: 0.1 10*3/uL (ref 0.0–0.1)
BASOS PCT: 2 % — AB (ref 0–1)
Eosinophils Absolute: 0.3 10*3/uL (ref 0.0–0.7)
Eosinophils Relative: 5 % (ref 0–5)
HCT: 33.7 % — ABNORMAL LOW (ref 36.0–46.0)
HEMOGLOBIN: 10.7 g/dL — AB (ref 12.0–15.0)
Lymphocytes Relative: 19 % (ref 12–46)
Lymphs Abs: 1.2 10*3/uL (ref 0.7–4.0)
MCH: 29.7 pg (ref 26.0–34.0)
MCHC: 31.8 g/dL (ref 30.0–36.0)
MCV: 93.6 fL (ref 78.0–100.0)
MONOS PCT: 10 % (ref 3–12)
Monocytes Absolute: 0.6 10*3/uL (ref 0.1–1.0)
NEUTROS ABS: 4.4 10*3/uL (ref 1.7–7.7)
NEUTROS PCT: 65 % (ref 43–77)
Platelets: 334 10*3/uL (ref 150–400)
RBC: 3.6 MIL/uL — ABNORMAL LOW (ref 3.87–5.11)
RDW: 16.8 % — AB (ref 11.5–15.5)
WBC: 6.7 10*3/uL (ref 4.0–10.5)

## 2013-08-08 LAB — PROTIME-INR
INR: 8.43 — AB (ref 0.00–1.49)
Prothrombin Time: 66.2 seconds — ABNORMAL HIGH (ref 11.6–15.2)

## 2013-08-08 LAB — GLUCOSE, CAPILLARY
GLUCOSE-CAPILLARY: 299 mg/dL — AB (ref 70–99)
Glucose-Capillary: 180 mg/dL — ABNORMAL HIGH (ref 70–99)
Glucose-Capillary: 187 mg/dL — ABNORMAL HIGH (ref 70–99)
Glucose-Capillary: 290 mg/dL — ABNORMAL HIGH (ref 70–99)
Glucose-Capillary: 282 mg/dL — ABNORMAL HIGH (ref 70–99)

## 2013-08-08 LAB — MAGNESIUM: MAGNESIUM: 1 mg/dL — AB (ref 1.5–2.5)

## 2013-08-08 NOTE — Progress Notes (Signed)
[facility] PROGRESS NOTE  [name] IOE:[id number] DOB: 04-29-1923 DOA: 08/06/2013 PCP: Loura Pardon, MD  Assessment/Plan: A-Fib on chronic anticoagulation  -Presented with INR of over 15 s/p 2.5 mg vitamin K, with INR down to 7.7 initially, now back up to 8.8. - No signs of active bleeding - cont to hold coumadin; INR 8.4 this AM - Pharmacy consulted  - presently rate controlled Supratherapeutic INR  -Per above HTN  -Stable -Cont current regimen -Patient not on ACE/ARB. secondary to poor renal function  HLD  -Obtain lipid panel  -Continue home meds for now  Diabetes type 2 with peripheral neuropathy  -hemoglobin A1c of 8.3 -Manage CBG with moderate SSI while in the hospital  -Make adjustments to patient's medication prior to discharge  Bullous pemphigoid  -Continue home medication  Renal insufficiency  -Avoid all nephrotoxic medication  Code Status: Full Family Communication: Pt in room (indicate person spoken with, relationship, and if by phone, the number) Disposition Plan: Pending   HPI/Subjective: No acute events. Eager to go home  Objective: Filed Vitals:   08/07/13 1357 08/07/13 2135 08/07/13 2204 08/08/13 0517  BP: 147/82 145/80 135/56 100/56  Pulse: 75 80 77 77  Temp: 97.6 F (36.4 C)  98.6 F (37 C) 98.4 F (36.9 C)  TempSrc: Oral  Oral Oral  Resp: 18  18 20   Height:      Weight:      SpO2: 98%  100% 97%    Intake/Output Summary (Last 24 hours) at 08/08/13 0932 Last data filed at 08/08/13 0644  Gross per 24 hour  Intake 2871.66 ml  Output      0 ml  Net 2871.66 ml   Filed Weights   08/06/13 1720  Weight: 110.904 kg (244 lb 8 oz)    Exam:   General:  Awake, in nad  Cardiovascular: regular, s1, s2  Respiratory: normal resp effort, no wheezing  Abdomen: soft, nondistended  Musculoskeletal: perfused, no clubbing   Data Reviewed: Basic Metabolic Panel:  Recent Labs Lab 08/05/13 1318 08/06/13 1146 08/07/13 0545  08/08/13 0605  NA 140 140 143 141  K 3.6 3.7 3.4* 3.3*  CL 106 102 107 105  CO2 23 23 21 22   GLUCOSE 308* 282* 220* 200*  BUN 21 22 21 20   CREATININE 1.4* 1.26* 1.33* 1.25*  CALCIUM 7.9* 8.4 8.0* 7.9*  MG  --   --  1.0* 1.0*   Liver Function Tests:  Recent Labs Lab 08/07/13 0545 08/08/13 0605  AST 64* 61*  ALT 47* 46*  ALKPHOS 272* 302*  BILITOT 6.8* 8.2*  PROT 6.4 6.8  ALBUMIN 2.5* 2.6*   No results found for this basename: LIPASE, AMYLASE,  in the last 168 hours No results found for this basename: AMMONIA,  in the last 168 hours CBC:  Recent Labs Lab 08/05/13 1318 08/06/13 1146 08/07/13 0545 08/08/13 0605  WBC 6.0 6.8 6.3 6.7  NEUTROABS 4.0 5.1 4.1 4.4  HGB 10.9* 11.4* 10.4* 10.7*  HCT 33.7* 34.4* 31.4* 33.7*  MCV 92.6 91.5 91.3 93.6  PLT 343.0 366 348 334   Cardiac Enzymes: No results found for this basename: CKTOTAL, CKMB, CKMBINDEX, TROPONINI,  in the last 168 hours BNP (last 3 results)  Recent Labs  03/07/13 1820  PROBNP 7836.0*   CBG:  Recent Labs Lab 08/07/13 1612 08/07/13 2020 08/07/13 2355 08/08/13 0431 08/08/13 0713  GLUCAP 355* 322* 238* 180* 187*    No results found for this or any previous visit (from  the past 240 hour(s)).   Studies: No results found.  Scheduled Meds: . furosemide  20 mg Oral q morning - 10a  . insulin aspart  0-15 Units Subcutaneous Q4H  . levothyroxine  150 mcg Oral QAC breakfast  . metoprolol  25 mg Oral BID  . pantoprazole  40 mg Oral Daily  . potassium chloride SA  20 mEq Oral q morning - 10a  . simvastatin  40 mg Oral QHS  . Warfarin - Pharmacist Dosing Inpatient   Does not apply q1800   Continuous Infusions: . sodium chloride 100 mL/hr at 08/07/13 0419    Active Problems:   HYPERLIPIDEMIA   HYPERTENSION   Atrial fibrillation   Irritable bowel syndrome   Rosacea   DM type 2 with diabetic peripheral neuropathy   Anemia   History of cerebrovascular disease   Supratherapeutic INR   Pulmonary  hypertension   Time spent: 74min   CHIU, [facility] Pager [phone]. If 7PM-7AM, please contact night-coverage at [email or web address], password [facility] 08/08/2013, 9:32 AM  LOS: 2 days

## 2013-08-08 NOTE — Progress Notes (Addendum)
ANTICOAGULATION CONSULT NOTE - Follow-up Consult  Pharmacy Consult for Coumadin Indication: atrial fibrillation  Allergies  Allergen Reactions  . Ciprofloxacin     Nausea Possibly rash and itching- but not sure   . Gabapentin Nausea And Vomiting  . Insulin Glargine     Itching   . Latex     Rash   . Metformin And Related     Severe diarrhea   . Metoclopramide Hcl Nausea And Vomiting  . Nsaids Nausea And Vomiting  . Promethazine Hcl Other (See Comments)    Reaction unknown    Patient Measurements: Height: 5\' 2"  (157.5 cm) Weight: 244 lb 8 oz (110.904 kg) IBW/kg (Calculated) : 50.1  Vital Signs: Temp: 98.4 F (36.9 C) (01/08 0517) Temp src: Oral (01/08 0517) BP: 100/56 mmHg (01/08 0517) Pulse Rate: 77 (01/08 0517)  Labs:  Recent Labs  08/06/13 1146 08/07/13 0545 08/07/13 1000 08/08/13 0605  HGB 11.4* 10.4*  --  10.7*  HCT 34.4* 31.4*  --  33.7*  PLT 366 348  --  334  APTT 95*  --   --   --   LABPROT 61.9*  --  67.9* 66.2*  INR 7.72*  --  8.72* 8.43*  CREATININE 1.26* 1.33*  --  1.25*    Estimated Creatinine Clearance: 36.5 ml/min (by C-G formula based on Cr of 1.25).   Medical History: Past Medical History  Diagnosis Date  . Atrial fibrillation   . GERD (gastroesophageal reflux disease)   . Hyperlipidemia   . Hypertension   . Hypothyroid   . Obesity   . Vaginal dysplasia 1980  . Rosacea   . Chest pain 11/2002    cardiac cath neg   . Back pain   . IBS (irritable bowel syndrome)     with diarrhea  . Asthma     as a child  . Adenomatous colon polyp 12/2007  . Hemorrhoids   . Vertigo   . Hiatal hernia 12/2007    EGD  . CHF (congestive heart failure)   . Complication of anesthesia     "dr said I should never be put to sleep again" (03/07/2013)  . Exertional shortness of breath   . Type II diabetes mellitus   . Iron deficiency anemia   . Arthritis     "all over my body" (03/07/2013)  . Vaginal cancer   . History of cerebrovascular disease  04/24/2013  . Abnormality of gait 04/24/2013    Medications:  Prescriptions prior to admission  Medication Sig Dispense Refill  . acetaminophen (TYLENOL) 500 MG tablet Take 500 mg by mouth every 6 (six) hours as needed for mild pain.       . BD PEN NEEDLE NANO U/F 32G X 4 MM MISC USE AS DIRECTED WITH INSULIN PEN  100 each  1  . cefUROXime (CEFTIN) 250 MG tablet Take 1 tablet (250 mg total) by mouth 2 (two) times daily with a meal.  10 tablet  0  . fluocinonide cream (LIDEX) 9.37 % Apply 1 application topically 2 (two) times daily as needed (For sores on body.).       Marland Kitchen furosemide (LASIX) 40 MG tablet Take 20 mg by mouth every morning.      Marland Kitchen glucose blood (ONE TOUCH ULTRA TEST) test strip Check sugars 2x a day  200 each  11  . hydrocortisone cream 1 % Apply 1 application topically 2 (two) times daily as needed for itching.      . hydrOXYzine (ATARAX/VISTARIL)  10 MG tablet Take 10-30 mg by mouth at bedtime as needed for itching.      . Insulin Isophane & Regular (HUMULIN 70/30 [place]) Inject 42-50 Units into the skin 2 (two) times daily before a meal. She takes 50 units before breakfast and 42 units before dinner.      . Insulin Syringe-Needle U-100 (BD INSULIN SYRINGE ULTRAFINE) 31G X 15/64" 1 ML MISC 1 each by Does not apply route every morning. Use 2 to 3 times daily as directed.  100 each  5  . levothyroxine (SYNTHROID, LEVOTHROID) 150 MCG tablet Take 150 mcg by mouth daily before breakfast.      . loperamide (ANTI-DIARRHEAL) 2 MG capsule Take 2-4 mg by mouth 4 (four) times daily as needed for diarrhea or loose stools.      . methotrexate 2.5 MG tablet Take 15 mg by mouth every Friday.       . metoprolol (LOPRESSOR) 50 MG tablet Take 25 mg by mouth 2 (two) times daily.      Marland Kitchen omeprazole (PRILOSEC) 20 MG capsule Take 20 mg by mouth at bedtime.      . potassium chloride SA (K-DUR,KLOR-CON) 20 MEQ tablet Take 20 mEq by mouth every morning.      . silver sulfADIAZINE (SILVADENE) 1 % cream Apply 1  application topically daily as needed (Applies to body.).       Marland Kitchen simvastatin (ZOCOR) 40 MG tablet Take 1 tablet (40 mg total) by mouth at bedtime.  90 tablet  1  . traMADol (ULTRAM) 50 MG tablet Take 50-100 mg by mouth 3 (three) times daily as needed for severe pain.       Marland Kitchen warfarin (COUMADIN) 5 MG tablet Take 2.5-5 mg by mouth at bedtime. She takes one tablet on Monday and half a tablet the rest of the week.        Assessment: 80 yo F on chronic Coumadin for Afib; PMH also includes CKD and recent hospitalization for UTI tx with CTX-->cefuroxime, admitted 1/6 from PCP office d/t supratherapeutic INR (15.4 on admission)  Vit.K 2.5mg  PO given on 1/6. INR subsequently improved to 7.72 but then rose above 8 and has remained in that range despite withholding warfarin, elimination of potentially interacting medications, and documentation of adequate dietary intake.  Bilirubin elevation noted (8.2 today).  Etiology uncertain, but suspect hepatic dysfunction may explain the recent dramatic change in INR response.  Hgb and Hct stable, pltc WNL.  No overt bleeding reported.  Goal of Therapy:  INR 2-3    Plan:   Cont to hold warfarin.  Will defer to MD on potential need for further vitamin K doses.  Check PT/INR daily while inpatient.  Clayburn Pert, PharmD, BCPS Pager: [phone] 08/08/2013  7:59 AM

## 2013-08-08 NOTE — Telephone Encounter (Signed)
Pt left message with Morey Hummingbird wanting to let Dr Glori Bickers know that she was admitted to [facility] and having a difficult time getting INR below 8.

## 2013-08-08 NOTE — Telephone Encounter (Signed)
Thanks - I have followed her notes in epic  I will make sure [name] knows as well

## 2013-08-09 ENCOUNTER — Observation Stay (HOSPITAL_COMMUNITY): Payer: Medicare Other

## 2013-08-09 DIAGNOSIS — D649 Anemia, unspecified: Secondary | ICD-10-CM

## 2013-08-09 LAB — COMPREHENSIVE METABOLIC PANEL
ALBUMIN: 2.5 g/dL — AB (ref 3.5–5.2)
ALK PHOS: 317 U/L — AB (ref 39–117)
ALT: 44 U/L — AB (ref 0–35)
AST: 68 U/L — AB (ref 0–37)
BUN: 18 mg/dL (ref 6–23)
CO2: 22 mEq/L (ref 19–32)
CREATININE: 1.22 mg/dL — AB (ref 0.50–1.10)
Calcium: 7.8 mg/dL — ABNORMAL LOW (ref 8.4–10.5)
Chloride: 102 mEq/L (ref 96–112)
GFR calc Af Amer: 44 mL/min — ABNORMAL LOW (ref >90)
GFR, EST NON AFRICAN AMERICAN: 38 mL/min — AB (ref >90)
Glucose, Bld: 178 mg/dL — ABNORMAL HIGH (ref 70–99)
POTASSIUM: 3.4 meq/L — AB (ref 3.7–5.3)
SODIUM: 138 meq/L (ref 137–147)
Total Bilirubin: 9.7 mg/dL — ABNORMAL HIGH (ref 0.3–1.2)
Total Protein: 6.7 g/dL (ref 6.0–8.3)

## 2013-08-09 LAB — CBC WITH DIFFERENTIAL/PLATELET
BASOS ABS: 0.1 10*3/uL (ref 0.0–0.1)
Basophils Relative: 1 % (ref 0–1)
EOS ABS: 0.3 10*3/uL (ref 0.0–0.7)
EOS PCT: 5 % (ref 0–5)
HEMATOCRIT: 32.1 % — AB (ref 36.0–46.0)
Hemoglobin: 10.6 g/dL — ABNORMAL LOW (ref 12.0–15.0)
LYMPHS PCT: 17 % (ref 12–46)
Lymphs Abs: 1.1 10*3/uL (ref 0.7–4.0)
MCH: 29.9 pg (ref 26.0–34.0)
MCHC: 33 g/dL (ref 30.0–36.0)
MCV: 90.4 fL (ref 78.0–100.0)
MONO ABS: 0.8 10*3/uL (ref 0.1–1.0)
Monocytes Relative: 13 % — ABNORMAL HIGH (ref 3–12)
Neutro Abs: 4 10*3/uL (ref 1.7–7.7)
Neutrophils Relative %: 63 % (ref 43–77)
PLATELETS: 318 10*3/uL (ref 150–400)
RBC: 3.55 MIL/uL — ABNORMAL LOW (ref 3.87–5.11)
RDW: 17 % — AB (ref 11.5–15.5)
WBC: 6.3 10*3/uL (ref 4.0–10.5)

## 2013-08-09 LAB — PROTIME-INR
INR: 6.46 (ref 0.00–1.49)
PROTHROMBIN TIME: 54 s — AB (ref 11.6–15.2)

## 2013-08-09 LAB — GLUCOSE, CAPILLARY
GLUCOSE-CAPILLARY: 215 mg/dL — AB (ref 70–99)
GLUCOSE-CAPILLARY: 313 mg/dL — AB (ref 70–99)
Glucose-Capillary: 164 mg/dL — ABNORMAL HIGH (ref 70–99)
Glucose-Capillary: 166 mg/dL — ABNORMAL HIGH (ref 70–99)
Glucose-Capillary: 232 mg/dL — ABNORMAL HIGH (ref 70–99)
Glucose-Capillary: 294 mg/dL — ABNORMAL HIGH (ref 70–99)

## 2013-08-09 LAB — MAGNESIUM: Magnesium: 0.9 mg/dL — CL (ref 1.5–2.5)

## 2013-08-09 LAB — HEPATITIS PANEL, ACUTE
HCV Ab: NEGATIVE
HEP B C IGM: NONREACTIVE
Hep A IgM: NONREACTIVE
Hepatitis B Surface Ag: NEGATIVE

## 2013-08-09 LAB — ACETAMINOPHEN LEVEL

## 2013-08-09 MED ORDER — HYDROXYZINE HCL 50 MG/ML IM SOLN
25.0000 mg | Freq: Four times a day (QID) | INTRAMUSCULAR | Status: DC | PRN
  Filled 2013-08-09: qty 0.5

## 2013-08-09 MED ORDER — LORAZEPAM 2 MG/ML IJ SOLN
1.0000 mg | INTRAMUSCULAR | Status: DC | PRN
  Administered 2013-08-09: 1 mg via INTRAVENOUS
  Filled 2013-08-09: qty 1

## 2013-08-09 MED ORDER — FUROSEMIDE 10 MG/ML IJ SOLN
60.0000 mg | Freq: Every day | INTRAMUSCULAR | Status: DC
  Administered 2013-08-09 – 2013-08-13 (×5): 60 mg via INTRAVENOUS
  Filled 2013-08-09 (×5): qty 6

## 2013-08-09 MED ORDER — MAGNESIUM SULFATE 40 MG/ML IJ SOLN
2.0000 g | Freq: Once | INTRAMUSCULAR | Status: AC
Start: 2013-08-09 — End: 2013-08-09
  Administered 2013-08-09: 2 g via INTRAVENOUS
  Filled 2013-08-09: qty 50

## 2013-08-09 MED ORDER — DIPHENHYDRAMINE HCL 50 MG/ML IJ SOLN: 25.0000 mg | Freq: Four times a day (QID) | INTRAMUSCULAR | Status: DC | PRN

## 2013-08-09 NOTE — Progress Notes (Signed)
Inpatient Diabetes Program Recommendations  AACE/ADA: New Consensus Statement on Inpatient Glycemic Control (2013)  Target Ranges:  Prepandial:   less than 140 mg/dL      Peak postprandial:   less than 180 mg/dL (1-2 hours)      Critically ill patients:  140 - 180 mg/dL   Reason for Visit: Hyperglycemia  Pt eating 95% meal.  Results for LOYE, REININGER (MRN [id number]) as of 08/09/2013 09:28  Ref. Range 08/08/2013 11:29 08/08/2013 16:41 08/08/2013 19:57 08/09/2013 00:19 08/09/2013 04:40 08/09/2013 07:11  Glucose-Capillary Latest Range: 70-99 mg/dL 290 (H) 299 (H) 282 (H) 232 (H) 164 (H) 166 (H)      Results for ELAYJAH, CHANEY (MRN [id number]) as of 08/09/2013 09:28  Ref. Range 08/09/2013 05:30  Sodium Latest Range: 137-147 mEq/L 138  Potassium Latest Range: 3.7-5.3 mEq/L 3.4 (L)  Chloride Latest Range: 96-112 mEq/L 102  CO2 Latest Range: 19-32 mEq/L 22  BUN Latest Range: 6-23 mg/dL 18  Creatinine Latest Range: 0.50-1.10 mg/dL 1.22 (H)  Calcium Latest Range: 8.4-10.5 mg/dL 7.8 (L)  GFR calc non Af Amer Latest Range: >90 mL/min 38 (L)  GFR calc Af Amer Latest Range: >90 mL/min 44 (L)  Glucose Latest Range: 70-99 mg/dL 178 (H)   Consider restarting half of 70/30 home insulin.  Thank you. Lorenda Peck, RD, LDN, CDE Inpatient Diabetes Coordinator [phone]

## 2013-08-09 NOTE — Progress Notes (Signed)
Per Thurmond Butts in the lab, specimen for Acetaminophen is too icteric, will need to send specimen off to [facility] for testing. Lab will be held as pending until resulted.

## 2013-08-09 NOTE — Progress Notes (Signed)
CRITICAL VALUE ALERT  Critical value received:  Mag 0.9  And  INR 6.46  Date of notification:  08/09/13  Time of notification:  0800  Critical value read back:yes  Nurse who received alert:  Maryan Puls  MD notified (1st page):    Time of first page:    MD notified (2nd page):  Time of second page:  Responding MD:    Time MD responded:

## 2013-08-09 NOTE — Telephone Encounter (Signed)
Noted, thank you

## 2013-08-09 NOTE — Telephone Encounter (Signed)
Yes- if the hospital physician does not order PT for her after hospitalization I will  I hope she is starting to feel better

## 2013-08-09 NOTE — Progress Notes (Signed)
[facility] PROGRESS NOTE  KATERIA CUTRONA FKC:[id number] DOB: [birth year] DOA: 08/06/2013 PCP: Loura Pardon, MD  Assessment/Plan: A-Fib on chronic anticoagulation  -Presented with INR of over 15 s/p 2.5 mg vitamin K, with INR down to 7.7 initially, now trending down - No signs of active bleeding - cont to hold coumadin - Pharmacy consulted  - presently rate controlled Supratherapeutic INR  -Per above HTN  -Stable -Cont current regimen -Patient not on ACE/ARB. secondary to poor renal function  HLD  -Obtain lipid panel  -Continue home meds for now  Diabetes type 2 with peripheral neuropathy  -hemoglobin A1c of 8.3 -Manage CBG with moderate SSI while in the hospital  -Make adjustments to patient's medication prior to discharge  Bullous pemphigoid  -Continue home medication  Renal insufficiency  -Avoid all nephrotoxic medication Elevated LFT's - Liver US this AM demonstrated intrahepatic and common bile ductdilitation, cannot rule out CBD stone or mass - MRCP pending per radiology recs - Pending results of MRCP, may consider GI consult - Pt admits to taking multiple tabs of tylenol prior to admit - Will check acetaminophen level - Avoid nephrotoxic drugs  Code Status: Full Family Communication: Pt in room (indicate person spoken with, relationship, and if by phone, the number) Disposition Plan: Pending   HPI/Subjective: No acute events. Eager to go home. Complains of itching  Objective: Filed Vitals:   08/08/13 1315 08/08/13 2058 08/09/13 0653 08/09/13 1417  BP: 95/66 148/74 144/83 126/74  Pulse: 75 79 80 79  Temp: 98.3 F (36.8 C) 98.1 F (36.7 C) 97.7 F (36.5 C) 98.1 F (36.7 C)  TempSrc: Oral Oral Oral Oral  Resp: 18 22 20 18   Height:      Weight:      SpO2: 98% 94% 97% 98%    Intake/Output Summary (Last 24 hours) at 08/09/13 1425 Last data filed at 08/09/13 1100  Gross per 24 hour  Intake 1711.99 ml  Output      0 ml  Net 1711.99 ml   Filed  Weights   08/06/13 1720  Weight: 110.904 kg (244 lb 8 oz)    Exam:   General:  Awake, in nad  Cardiovascular: regular, s1, s2  Respiratory: normal resp effort, no wheezing  Abdomen: soft, nondistended  Musculoskeletal: perfused, no clubbing   Skin: Jaundiced  Data Reviewed: Basic Metabolic Panel:  Recent Labs Lab 08/05/13 1318 08/06/13 1146 08/07/13 0545 08/08/13 0605 08/09/13 0530  NA 140 140 143 141 138  K 3.6 3.7 3.4* 3.3* 3.4*  CL 106 102 107 105 102  CO2 23 23 21 22 22   GLUCOSE 308* 282* 220* 200* 178*  BUN 21 22 21 20 18   CREATININE 1.4* 1.26* 1.33* 1.25* 1.22*  CALCIUM 7.9* 8.4 8.0* 7.9* 7.8*  MG  --   --  1.0* 1.0* 0.9*   Liver Function Tests:  Recent Labs Lab 08/07/13 0545 08/08/13 0605 08/09/13 0530  AST 64* 61* 68*  ALT 47* 46* 44*  ALKPHOS 272* 302* 317*  BILITOT 6.8* 8.2* 9.7*  PROT 6.4 6.8 6.7  ALBUMIN 2.5* 2.6* 2.5*   No results found for this basename: LIPASE, AMYLASE,  in the last 168 hours No results found for this basename: AMMONIA,  in the last 168 hours CBC:  Recent Labs Lab 08/05/13 1318 08/06/13 1146 08/07/13 0545 08/08/13 0605 08/09/13 0530  WBC 6.0 6.8 6.3 6.7 6.3  NEUTROABS 4.0 5.1 4.1 4.4 4.0  HGB 10.9* 11.4* 10.4* 10.7* 10.6*  HCT 33.7* 34.4* 31.4*  33.7* 32.1*  MCV 92.6 91.5 91.3 93.6 90.4  PLT 343.0 366 348 334 318   Cardiac Enzymes: No results found for this basename: CKTOTAL, CKMB, CKMBINDEX, TROPONINI,  in the last 168 hours BNP (last 3 results)  Recent Labs  03/07/13 1820  PROBNP 7836.0*   CBG:  Recent Labs Lab 08/08/13 1957 08/09/13 0019 08/09/13 0440 08/09/13 0711 08/09/13 1100  GLUCAP 282* 232* 164* 166* 215*    No results found for this or any previous visit (from the past 240 hour(s)).   Studies: US Abdomen Limited  08/09/2013   CLINICAL DATA:  Elevated LFTs.  EXAM: US ABDOMEN LIMITED - RIGHT UPPER QUADRANT  COMPARISON:  None.  FINDINGS: Gallbladder  Previous cholecystectomy.  Common  bile duct  Diameter: 18.4 mm.  Liver:  No focal lesion identified.  Intrahepatic ductal dilatation noted.  IMPRESSION: 1. Intrahepatic and common bile duct dilatation. Cannot rule out distal common bile duct stone or mass. Consider further evaluation with MRCP.   Electronically Signed   By: Kerby Moors M.D.   On: 08/09/2013 10:49    Scheduled Meds: . furosemide  20 mg Oral q morning - 10a  . insulin aspart  0-15 Units Subcutaneous Q4H  . levothyroxine  150 mcg Oral QAC breakfast  . metoprolol  25 mg Oral BID  . pantoprazole  40 mg Oral Daily  . potassium chloride SA  20 mEq Oral q morning - 10a  . Warfarin - Pharmacist Dosing Inpatient   Does not apply q1800   Continuous Infusions: . sodium chloride 100 mL/hr at 08/09/13 0800    Active Problems:   HYPERLIPIDEMIA   HYPERTENSION   Atrial fibrillation   Irritable bowel syndrome   Rosacea   DM type 2 with diabetic peripheral neuropathy   Anemia   History of cerebrovascular disease   Supratherapeutic INR   Pulmonary hypertension   Time spent: 74min   Jaylynn Siefert, [facility] Pager [phone]. If 7PM-7AM, please contact night-coverage at [email or web address], password [facility] 08/09/2013, 2:25 PM  LOS: 3 days

## 2013-08-09 NOTE — Telephone Encounter (Signed)
Pt left voicemail requesting a copy of labs we did to be mailed to her, done  Pt also wanted me to ask Dr. Glori Bickers if she is going to have therapy after her hospital stay, please advise

## 2013-08-09 NOTE — Progress Notes (Signed)
ANTICOAGULATION CONSULT NOTE - Follow-up Consult  Pharmacy Consult for Coumadin Indication: atrial fibrillation  Allergies  Allergen Reactions  . Ciprofloxacin     Nausea Possibly rash and itching- but not sure   . Gabapentin Nausea And Vomiting  . Insulin Glargine     Itching   . Latex     Rash   . Metformin And Related     Severe diarrhea   . Metoclopramide Hcl Nausea And Vomiting  . Nsaids Nausea And Vomiting  . Promethazine Hcl Other (See Comments)    Reaction unknown    Patient Measurements: Height: 5\' 2"  (157.5 cm) Weight: 244 lb 8 oz (110.904 kg) IBW/kg (Calculated) : 50.1  Vital Signs: Temp: 97.7 F (36.5 C) (01/09 0653) Temp src: Oral (01/09 0653) BP: 144/83 mmHg (01/09 0653) Pulse Rate: 80 (01/09 0653)  Labs:  Recent Labs  08/06/13 1146 08/07/13 0545 08/07/13 1000 08/08/13 0605 08/09/13 0530  HGB 11.4* 10.4*  --  10.7* 10.6*  HCT 34.4* 31.4*  --  33.7* 32.1*  PLT 366 348  --  334 318  APTT 95*  --   --   --   --   LABPROT 61.9*  --  67.9* 66.2* 54.0*  INR 7.72*  --  8.72* 8.43* 6.46*  CREATININE 1.26* 1.33*  --  1.25* 1.22*    Estimated Creatinine Clearance: 37.4 ml/min (by C-G formula based on Cr of 1.22).   Medical History: Past Medical History  Diagnosis Date  . Atrial fibrillation   . GERD (gastroesophageal reflux disease)   . Hyperlipidemia   . Hypertension   . Hypothyroid   . Obesity   . Vaginal dysplasia 1980  . Rosacea   . Chest pain 11/2002    cardiac cath neg   . Back pain   . IBS (irritable bowel syndrome)     with diarrhea  . Asthma     as a child  . Adenomatous colon polyp 12/2007  . Hemorrhoids   . Vertigo   . Hiatal hernia 12/2007    EGD  . CHF (congestive heart failure)   . Complication of anesthesia     "dr said I should never be put to sleep again" (03/07/2013)  . Exertional shortness of breath   . Type II diabetes mellitus   . Iron deficiency anemia   . Arthritis     "all over my body" (03/07/2013)  .  Vaginal cancer   . History of cerebrovascular disease 04/24/2013  . Abnormality of gait 04/24/2013    Assessment: 80 yo F on chronic Coumadin for Afib; PMH also includes CKD and recent hospitalization for UTI tx with CTX-->cefuroxime, admitted 1/6 from PCP office d/t supratherapeutic INR (15.4 on admission)  Vit.K 2.5mg  PO given on 1/6. INR subsequently improved to 7.72 but then rose above 8 and has remained in that range despite withholding warfarin, elimination of potentially interacting medications, and documentation of adequate dietary intake.  Bilirubin remains elevated. Etiology uncertain, but suspect hepatic dysfunction may explain the recent dramatic change in INR response. Hepatitis panel ordered.  Hgb and Hct stable, pltc WNL.  No overt bleeding reported.  Goal of Therapy:  INR 2-3   Plan:   Cont to hold warfarin.  Will defer to MD on potential need for further vitamin K doses.  Check PT/INR daily while inpatient.  Romeo Rabon, PharmD, pager [phone]. 08/09/2013,8:38 AM.

## 2013-08-10 ENCOUNTER — Encounter (HOSPITAL_COMMUNITY): Payer: Self-pay | Admitting: Radiology

## 2013-08-10 ENCOUNTER — Inpatient Hospital Stay (HOSPITAL_COMMUNITY): Payer: Medicare Other

## 2013-08-10 DIAGNOSIS — R932 Abnormal findings on diagnostic imaging of liver and biliary tract: Secondary | ICD-10-CM

## 2013-08-10 DIAGNOSIS — H8110 Benign paroxysmal vertigo, unspecified ear: Secondary | ICD-10-CM

## 2013-08-10 DIAGNOSIS — R7989 Other specified abnormal findings of blood chemistry: Secondary | ICD-10-CM

## 2013-08-10 DIAGNOSIS — R791 Abnormal coagulation profile: Secondary | ICD-10-CM

## 2013-08-10 LAB — BLOOD GAS, ARTERIAL
Acid-Base Excess: 1.8 mmol/L (ref 0.0–2.0)
Bicarbonate: 25.5 mEq/L — ABNORMAL HIGH (ref 20.0–24.0)
Drawn by: 40530
FIO2: 0.21 %
O2 Saturation: 93.3 %
PATIENT TEMPERATURE: 98.6
PH ART: 7.44 (ref 7.350–7.450)
TCO2: 22.8 mmol/L (ref 0–100)
pCO2 arterial: 38.2 mmHg (ref 35.0–45.0)
pO2, Arterial: 66.6 mmHg — ABNORMAL LOW (ref 80.0–100.0)

## 2013-08-10 LAB — GLUCOSE, CAPILLARY
GLUCOSE-CAPILLARY: 214 mg/dL — AB (ref 70–99)
GLUCOSE-CAPILLARY: 267 mg/dL — AB (ref 70–99)
Glucose-Capillary: 242 mg/dL — ABNORMAL HIGH (ref 70–99)
Glucose-Capillary: 255 mg/dL — ABNORMAL HIGH (ref 70–99)
Glucose-Capillary: 288 mg/dL — ABNORMAL HIGH (ref 70–99)

## 2013-08-10 LAB — COMPREHENSIVE METABOLIC PANEL
ALBUMIN: 2.7 g/dL — AB (ref 3.5–5.2)
ALT: 46 U/L — ABNORMAL HIGH (ref 0–35)
AST: 70 U/L — ABNORMAL HIGH (ref 0–37)
Alkaline Phosphatase: 372 U/L — ABNORMAL HIGH (ref 39–117)
BUN: 17 mg/dL (ref 6–23)
CALCIUM: 7.7 mg/dL — AB (ref 8.4–10.5)
CO2: 25 mEq/L (ref 19–32)
CREATININE: 1.31 mg/dL — AB (ref 0.50–1.10)
Chloride: 97 mEq/L (ref 96–112)
GFR calc non Af Amer: 35 mL/min — ABNORMAL LOW (ref >90)
GFR, EST AFRICAN AMERICAN: 41 mL/min — AB (ref >90)
GLUCOSE: 256 mg/dL — AB (ref 70–99)
POTASSIUM: 2.8 meq/L — AB (ref 3.7–5.3)
Sodium: 139 mEq/L (ref 137–147)
TOTAL PROTEIN: 7.1 g/dL (ref 6.0–8.3)
Total Bilirubin: 12.4 mg/dL — ABNORMAL HIGH (ref 0.3–1.2)

## 2013-08-10 LAB — CBC WITH DIFFERENTIAL/PLATELET
BASOS ABS: 0.1 10*3/uL (ref 0.0–0.1)
Basophils Relative: 1 % (ref 0–1)
EOS ABS: 0.3 10*3/uL (ref 0.0–0.7)
Eosinophils Relative: 4 % (ref 0–5)
HEMATOCRIT: 35 % — AB (ref 36.0–46.0)
HEMOGLOBIN: 11.7 g/dL — AB (ref 12.0–15.0)
LYMPHS ABS: 1.3 10*3/uL (ref 0.7–4.0)
Lymphocytes Relative: 22 % (ref 12–46)
MCH: 30.7 pg (ref 26.0–34.0)
MCHC: 33.4 g/dL (ref 30.0–36.0)
MCV: 91.9 fL (ref 78.0–100.0)
Monocytes Absolute: 1 10*3/uL (ref 0.1–1.0)
Monocytes Relative: 17 % — ABNORMAL HIGH (ref 3–12)
Neutro Abs: 3.3 10*3/uL (ref 1.7–7.7)
Neutrophils Relative %: 55 % (ref 43–77)
PLATELETS: 306 10*3/uL (ref 150–400)
RBC: 3.81 MIL/uL — AB (ref 3.87–5.11)
RDW: 17.4 % — AB (ref 11.5–15.5)
WBC: 5.9 10*3/uL (ref 4.0–10.5)

## 2013-08-10 LAB — PROTIME-INR
INR: 5.87 (ref 0.00–1.49)
Prothrombin Time: 50.2 seconds — ABNORMAL HIGH (ref 11.6–15.2)

## 2013-08-10 LAB — MAGNESIUM: Magnesium: 1.3 mg/dL — ABNORMAL LOW (ref 1.5–2.5)

## 2013-08-10 LAB — AMMONIA

## 2013-08-10 MED ORDER — IOHEXOL 300 MG/ML  SOLN
100.0000 mL | Freq: Once | INTRAMUSCULAR | Status: AC | PRN
  Administered 2013-08-10: 100 mL via INTRAVENOUS

## 2013-08-10 MED ORDER — POTASSIUM CHLORIDE CRYS ER 20 MEQ PO TBCR
30.0000 meq | EXTENDED_RELEASE_TABLET | ORAL | Status: AC
  Administered 2013-08-10 (×2): 30 meq via ORAL
  Filled 2013-08-10 (×2): qty 1

## 2013-08-10 MED ORDER — IPRATROPIUM BROMIDE 0.02 % IN SOLN
0.5000 mg | RESPIRATORY_TRACT | Status: DC | PRN
  Administered 2013-08-10: 0.5 mg via RESPIRATORY_TRACT
  Filled 2013-08-10: qty 2.5

## 2013-08-10 MED ORDER — LEVALBUTEROL HCL 0.63 MG/3ML IN NEBU
0.6300 mg | INHALATION_SOLUTION | Freq: Four times a day (QID) | RESPIRATORY_TRACT | Status: DC | PRN
  Administered 2013-08-10: 0.63 mg via RESPIRATORY_TRACT
  Filled 2013-08-10: qty 3

## 2013-08-10 MED ORDER — FUROSEMIDE 10 MG/ML IJ SOLN
40.0000 mg | Freq: Once | INTRAMUSCULAR | Status: DC
  Filled 2013-08-10: qty 4

## 2013-08-10 NOTE — Progress Notes (Signed)
Pt increased wheezing, o2 sat 05 percent RA. Respiratory at bedside.   Walden Field made aware.  New orders given

## 2013-08-10 NOTE — Progress Notes (Signed)
CRITICAL VALUE ALERT  Critical value received:  Potassium 2.8  Date of notification:  08/10/2013  Time of notification:  0647  Critical value read back:yes  Nurse who received alert:  Lannie Fields  MD notified (1st page):  Walden Field  Time of first page:  0645  MD notified (2nd page):  Time of second page:  Responding MD:    Time MD responded:

## 2013-08-10 NOTE — Progress Notes (Signed)
[facility] PROGRESS NOTE  [name] ZOX:[id number] DOB: [birth year] DOA: 08/06/2013 PCP: Loura Pardon, MD  Assessment/Plan: A-Fib on chronic anticoagulation  -Presented with INR of over 15 s/p 2.5 mg vitamin K, with INR down to 7.7 initially, now trending down off coumadin - No signs of active bleeding - cont to hold coumadin - Pharmacy consulted  - presently rate controlled - INR likely elevated secondary to abnormal liver function below Supratherapeutic INR  -Per above HTN  -Stable -Cont current regimen -Patient not on ACE/ARB. secondary to poor renal function  HLD  -Obtain lipid panel  -d/c'd statin secondary to elevated LFT's Diabetes type 2 with peripheral neuropathy  -hemoglobin A1c of 8.3 -Manage CBG with moderate SSI while in the hospital  -Make adjustments to patient's medication prior to discharge  Bullous pemphigoid  -Continue home medication  Renal insufficiency  -Avoid all nephrotoxic medication Elevated LFT's - Liver US on 08/09/13 demonstrated intrahepatic and common bile ductdilitation, cannot rule out CBD stone or mass - MRCP was ordered, however per staff, pt was apparently unable to tolerate procedure - Will consult GI - Pt admited to taking multiple tabs of tylenol prior to admit - Acetaminophen level <15 - Cont to avoid hepatotoxic drugs Hypokalemia - Likely secondary to diuretics that were recently increased - Replaced overnight  Code Status: Full Family Communication: Pt in room (indicate person spoken with, relationship, and if by phone, the number) Disposition Plan: Pending   HPI/Subjective: No acute events. Remains eager to go home.  Objective: Filed Vitals:   08/09/13 0653 08/09/13 1417 08/09/13 2100 08/10/13 0149  BP: 144/83 126/74 143/57   Pulse: 80 79 79   Temp: 97.7 F (36.5 C) 98.1 F (36.7 C) 98.3 F (36.8 C)   TempSrc: Oral Oral Oral   Resp: 20 18 20    Height:      Weight:      SpO2: 97% 98% 99% 94%     Intake/Output Summary (Last 24 hours) at 08/10/13 0843 Last data filed at 08/09/13 1645  Gross per 24 hour  Intake    500 ml  Output      0 ml  Net    500 ml   Filed Weights   08/06/13 1720  Weight: 110.904 kg (244 lb 8 oz)    Exam:   General:  Awake, in nad  Cardiovascular: regular, s1, s2  Respiratory: normal resp effort, no wheezing  Abdomen: soft, nondistended  Musculoskeletal: perfused, no clubbing   Skin: Jaundiced  Data Reviewed: Basic Metabolic Panel:  Recent Labs Lab 08/06/13 1146 08/07/13 0545 08/08/13 0605 08/09/13 0530 08/10/13 0603  NA 140 143 141 138 139  K 3.7 3.4* 3.3* 3.4* 2.8*  CL 102 107 105 102 97  CO2 23 21 22 22 25   GLUCOSE 282* 220* 200* 178* 256*  BUN 22 21 20 18 17   CREATININE 1.26* 1.33* 1.25* 1.22* 1.31*  CALCIUM 8.4 8.0* 7.9* 7.8* 7.7*  MG  --  1.0* 1.0* 0.9* 1.3*   Liver Function Tests:  Recent Labs Lab 08/07/13 0545 08/08/13 0605 08/09/13 0530 08/10/13 0603  AST 64* 61* 68* 70*  ALT 47* 46* 44* 46*  ALKPHOS 272* 302* 317* 372*  BILITOT 6.8* 8.2* 9.7* 12.4*  PROT 6.4 6.8 6.7 7.1  ALBUMIN 2.5* 2.6* 2.5* 2.7*   No results found for this basename: LIPASE, AMYLASE,  in the last 168 hours No results found for this basename: AMMONIA,  in the last 168 hours CBC:  Recent Labs Lab  08/06/13 1146 08/07/13 0545 08/08/13 0605 08/09/13 0530 08/10/13 0603  WBC 6.8 6.3 6.7 6.3 5.9  NEUTROABS 5.1 4.1 4.4 4.0 3.3  HGB 11.4* 10.4* 10.7* 10.6* 11.7*  HCT 34.4* 31.4* 33.7* 32.1* 35.0*  MCV 91.5 91.3 93.6 90.4 91.9  PLT 366 348 334 318 306   Cardiac Enzymes: No results found for this basename: CKTOTAL, CKMB, CKMBINDEX, TROPONINI,  in the last 168 hours BNP (last 3 results)  Recent Labs  03/07/13 1820  PROBNP 7836.0*   CBG:  Recent Labs Lab 08/09/13 1100 08/09/13 1616 08/09/13 2009 08/09/13 2340 08/10/13 0351  GLUCAP 215* 313* 294* 255* 242*    No results found for this or any previous visit (from the  past 240 hour(s)).   Studies: US Abdomen Limited  08/09/2013   CLINICAL DATA:  Elevated LFTs.  EXAM: US ABDOMEN LIMITED - RIGHT UPPER QUADRANT  COMPARISON:  None.  FINDINGS: Gallbladder  Previous cholecystectomy.  Common bile duct  Diameter: 18.4 mm.  Liver:  No focal lesion identified.  Intrahepatic ductal dilatation noted.  IMPRESSION: 1. Intrahepatic and common bile duct dilatation. Cannot rule out distal common bile duct stone or mass. Consider further evaluation with MRCP.   Electronically Signed   By: Kerby Moors M.D.   On: 08/09/2013 10:49   Dg Chest Port 1 View  08/09/2013   CLINICAL DATA:  Shortness of breath  EXAM: PORTABLE CHEST - 1 VIEW  COMPARISON:  03/07/2013  FINDINGS: Low lung volumes. Cardiac silhouette is enlarged. There is prominence of the interstitial markings, there is prominence of the interstitial markings and mild peribronchial cuffing. No focal regions of consolidation or focal infiltrates identified. Degenerative changes appreciated within the right and left shoulders.  IMPRESSION: Pulmonary vascular congestion/mild edema. No focal regions of consolidation or focal infiltrates. The findings within the lung parenchyma are accentuated by the low lung volumes.   Electronically Signed   By: Margaree Mackintosh M.D.   On: 08/09/2013 15:55    Scheduled Meds: . furosemide  60 mg Intravenous Daily  . insulin aspart  0-15 Units Subcutaneous Q4H  . levothyroxine  150 mcg Oral QAC breakfast  . metoprolol  25 mg Oral BID  . pantoprazole  40 mg Oral Daily  . potassium chloride  30 mEq Oral Q4H  . potassium chloride SA  20 mEq Oral q morning - 10a  . Warfarin - Pharmacist Dosing Inpatient   Does not apply q1800   Continuous Infusions: . sodium chloride 100 mL/hr at 08/09/13 0800    Active Problems:   HYPERLIPIDEMIA   HYPERTENSION   Atrial fibrillation   Irritable bowel syndrome   Rosacea   DM type 2 with diabetic peripheral neuropathy   Anemia   History of cerebrovascular  disease   Supratherapeutic INR   Pulmonary hypertension   Time spent: 22min   [name], [facility] Pager [phone]. If 7PM-7AM, please contact night-coverage at [email or web address], password [facility] 08/10/2013, 8:43 AM  LOS: 4 days

## 2013-08-10 NOTE — Progress Notes (Signed)
ANTICOAGULATION CONSULT NOTE - Follow-up Consult  Pharmacy Consult for Coumadin Indication: atrial fibrillation  Allergies  Allergen Reactions  . Ciprofloxacin     Nausea Possibly rash and itching- but not sure   . Gabapentin Nausea And Vomiting  . Insulin Glargine     Itching   . Latex     Rash   . Metformin And Related     Severe diarrhea   . Metoclopramide Hcl Nausea And Vomiting  . Nsaids Nausea And Vomiting  . Promethazine Hcl Other (See Comments)    Reaction unknown    Patient Measurements: Height: 5\' 2"  (157.5 cm) Weight: 244 lb 8 oz (110.904 kg) IBW/kg (Calculated) : 50.1  Vital Signs: Temp: 98.3 F (36.8 C) (01/09 2100) Temp src: Oral (01/09 2100) BP: 143/57 mmHg (01/09 2100) Pulse Rate: 79 (01/09 2100)  Labs:  Recent Labs  08/08/13 0605 08/09/13 0530 08/10/13 0603  HGB 10.7* 10.6* 11.7*  HCT 33.7* 32.1* 35.0*  PLT 334 318 306  LABPROT 66.2* 54.0* 50.2*  INR 8.43* 6.46* 5.87*  CREATININE 1.25* 1.22* 1.31*    Estimated Creatinine Clearance: 34.9 ml/min (by C-G formula based on Cr of 1.31).   Medical History: Past Medical History  Diagnosis Date  . Atrial fibrillation   . GERD (gastroesophageal reflux disease)   . Hyperlipidemia   . Hypertension   . Hypothyroid   . Obesity   . Vaginal dysplasia 1980  . Rosacea   . Chest pain 11/2002    cardiac cath neg   . Back pain   . IBS (irritable bowel syndrome)     with diarrhea  . Asthma     as a child  . Adenomatous colon polyp 12/2007  . Hemorrhoids   . Vertigo   . Hiatal hernia 12/2007    EGD  . CHF (congestive heart failure)   . Complication of anesthesia     "dr said I should never be put to sleep again" (03/07/2013)  . Exertional shortness of breath   . Type II diabetes mellitus   . Iron deficiency anemia   . Arthritis     "all over my body" (03/07/2013)  . Vaginal cancer   . History of cerebrovascular disease 04/24/2013  . Abnormality of gait 04/24/2013    Assessment: 80 yo F  on chronic Coumadin for Afib; PMH also includes CKD and recent hospitalization for UTI tx with CTX-->cefuroxime, admitted 1/6 from PCP office d/t supratherapeutic INR (15.4 on admission)  INR still supratherapeutic but trending down since 1/8.  Hgb and Hct stable, pltc WNL.  No overt bleeding reported.   Bilirubin elevated and rising. Dilated bile duct. Suspected stone or mass. MRCP is planned. Probably explains the recent dramatic change in INR response.  Hepatitis panel was negative.  Patient reports multiple Tylenol doses PTA but Tylenol levels were undetectable on 1/9.  Goal of Therapy:  INR 2-3   Plan:   Cont to hold warfarin. Resume when appropriate to keep INR between 2-3.  Check PT/INR daily.  Romeo Rabon, PharmD, pager [phone]. 08/10/2013,8:41 AM.

## 2013-08-10 NOTE — Progress Notes (Signed)
CRITICAL VALUE ALERT  Critical value received:  INR 5.87  Date of notification:  08/10/2013  Time of notification:  0638  Critical value read back:yes  Nurse who received alert:  Janett Labella RN  MD notified (1st page):  Walden Field  Time of first page:  [phone]  MD notified (2nd page):  Time of second page:  Responding MD:    Time MD responded:

## 2013-08-10 NOTE — Consult Note (Signed)
Referring Provider: No ref. provider found Primary Care Physician:  Loura Pardon, MD Primary Gastroenterologist:  Dr. Fuller Plan  Reason for Consultation:  Biliary obstruction  HPI: [name] is a 80 y.o. female with PMH of atrial fibrillation (on Coumadin), HLD, HTN, hypothyroid, irritable bowel syndrome, asthma, CHF, chronic renal insufficiency, diabetes type 2 (uncontrolled), Hx CVA, and iron deficiency anemia. She was admitted on 1/6 due to Atrial fibrillation and supratherapeutic INR of 15.4.  It was also noted that her LFT's were elevated as well and they have continued to increase during her stay.  Today total bili was 12.4, ALP 372, AST 70, and ALT 46.  Viral hepatitis panel was negative.  MRI abdomen/MRCP was ordered and apparently shows biliary obstruction, but final results have not been reported.  Ultrasound on 1/9 showed intrahepatic and common bile duct dilatation.  Could not rule out distal common duct stone or mass.  INR is still 5.87 today.  She is not febrile and does not have a leukocytosis.  No abdominal pain. She is not on antibiotics.  No nausea or vomiting.  She is s/p cholecystectomy.   Past Medical History  Diagnosis Date  . Atrial fibrillation   . GERD (gastroesophageal reflux disease)   . Hyperlipidemia   . Hypertension   . Hypothyroid   . Obesity   . Vaginal dysplasia 1980  . Rosacea   . Chest pain 11/2002    cardiac cath neg   . Back pain   . IBS (irritable bowel syndrome)     with diarrhea  . Asthma     as a child  . Adenomatous colon polyp 12/2007  . Hemorrhoids   . Vertigo   . Hiatal hernia 12/2007    EGD  . CHF (congestive heart failure)   . Complication of anesthesia     "dr said I should never be put to sleep again" (03/07/2013)  . Exertional shortness of breath   . Type II diabetes mellitus   . Iron deficiency anemia   . Arthritis     "all over my body" (03/07/2013)  . Vaginal cancer   . History of cerebrovascular disease 04/24/2013  .  Abnormality of gait 04/24/2013    Past Surgical History  Procedure Laterality Date  . Cholecystectomy  2006  . Joint replacement      Rt TKR [place] 2001  . Cervical cone biopsy  1963    D&C  . Cystocele repair  1978    /rectocele  . Orif ankle fracture Right 2006  . Total knee arthroplasty Right 1993  . Total knee arthroplasty Left 2001  . Cataract extraction w/ intraocular lens  implant, bilateral Bilateral 2006  . Ankle fracture surgery Right 2011    fall/followed by rehab stay; "hand to have 9 pins put in it" (03/07/2013)  . Cardiac catheterization  11/2002    Archie Endo 12/11/2002 (03/07/2013)  . Appendectomy  1941    Archie Endo 11/17/1999 (03/07/2013)  . Abdominal hysterectomy  1963    partial/notes 11/17/1999 (03/07/2013    Prior to Admission medications   Medication Sig Start Date End Date Taking? Authorizing Provider  acetaminophen (TYLENOL) 500 MG tablet Take 500 mg by mouth every 6 (six) hours as needed for mild pain.    Yes Historical Provider, MD  BD PEN NEEDLE NANO U/F 32G X 4 MM MISC USE AS DIRECTED WITH INSULIN PEN 01/16/13  Yes Abner Greenspan, MD  cefUROXime (CEFTIN) 250 MG tablet Take 1 tablet (250 mg  total) by mouth 2 (two) times daily with a meal. 07/28/13  Yes Theodis Blaze, MD  fluocinonide cream (LIDEX) 6.21 % Apply 1 application topically 2 (two) times daily as needed (For sores on body.).    Yes Historical Provider, MD  furosemide (LASIX) 40 MG tablet Take 20 mg by mouth every morning.   Yes Historical Provider, MD  glucose blood (ONE TOUCH ULTRA TEST) test strip Check sugars 2x a day 02/14/13  Yes Philemon Kingdom, MD  hydrocortisone cream 1 % Apply 1 application topically 2 (two) times daily as needed for itching.   Yes Historical Provider, MD  hydrOXYzine (ATARAX/VISTARIL) 10 MG tablet Take 10-30 mg by mouth at bedtime as needed for itching.   Yes Historical Provider, MD  Insulin Isophane & Regular (HUMULIN 70/30 [place]) Inject 42-50 Units into the skin 2 (two) times daily  before a meal. She takes 50 units before breakfast and 42 units before dinner.   Yes Historical Provider, MD  Insulin Syringe-Needle U-100 (BD INSULIN SYRINGE ULTRAFINE) 31G X 15/64" 1 ML MISC 1 each by Does not apply route every morning. Use 2 to 3 times daily as directed. 05/22/13  Yes Philemon Kingdom, MD  levothyroxine (SYNTHROID, LEVOTHROID) 150 MCG tablet Take 150 mcg by mouth daily before breakfast.   Yes Historical Provider, MD  loperamide (ANTI-DIARRHEAL) 2 MG capsule Take 2-4 mg by mouth 4 (four) times daily as needed for diarrhea or loose stools.   Yes Historical Provider, MD  methotrexate 2.5 MG tablet Take 15 mg by mouth every Friday.    Yes Historical Provider, MD  metoprolol (LOPRESSOR) 50 MG tablet Take 25 mg by mouth 2 (two) times daily.   Yes Historical Provider, MD  omeprazole (PRILOSEC) 20 MG capsule Take 20 mg by mouth at bedtime.   Yes Historical Provider, MD  potassium chloride SA (K-DUR,KLOR-CON) 20 MEQ tablet Take 20 mEq by mouth every morning.   Yes Historical Provider, MD  silver sulfADIAZINE (SILVADENE) 1 % cream Apply 1 application topically daily as needed (Applies to body.).    Yes Historical Provider, MD  simvastatin (ZOCOR) 40 MG tablet Take 1 tablet (40 mg total) by mouth at bedtime. 11/15/12 11/15/13 Yes Abner Greenspan, MD  traMADol (ULTRAM) 50 MG tablet Take 50-100 mg by mouth 3 (three) times daily as needed for severe pain.    Yes Historical Provider, MD  warfarin (COUMADIN) 5 MG tablet Take 2.5-5 mg by mouth at bedtime. She takes one tablet on Monday and half a tablet the rest of the week. 02/19/13  Yes Abner Greenspan, MD    Current Facility-Administered Medications  Medication Dose Route Frequency Provider Last Rate Last Dose  . 0.9 %  sodium chloride infusion   Intravenous Continuous Allie Bossier, MD 100 mL/hr at 08/09/13 0800    . diphenhydrAMINE (BENADRYL) capsule 25 mg  25 mg Oral Q6H PRN Allie Bossier, MD       Or  . diphenhydrAMINE (BENADRYL) injection 25  mg  25 mg Intramuscular Q6H PRN Allie Bossier, MD      . diphenhydrAMINE (BENADRYL) injection 25 mg  25 mg Intravenous Q6H PRN Donne Hazel, MD      . fluocinonide cream (LIDEX) 3.08 % 1 application  1 application Topical BID PRN Allie Bossier, MD      . furosemide (LASIX) injection 60 mg  60 mg Intravenous Daily Donne Hazel, MD   60 mg at 08/10/13 0918  . hydrocortisone cream 1 %  1 application  1 application Topical BID PRN Allie Bossier, MD      . insulin aspart (novoLOG) injection 0-15 Units  0-15 Units Subcutaneous Q4H Allie Bossier, MD   5 Units at 08/10/13 0845  . ipratropium (ATROVENT) nebulizer solution 0.5 mg  0.5 mg Nebulization Q4H PRN Ritta Slot, NP   0.5 mg at 08/10/13 0149  . levalbuterol (XOPENEX) nebulizer solution 0.63 mg  0.63 mg Nebulization Q6H PRN Ritta Slot, NP   0.63 mg at 08/10/13 0149  . levothyroxine (SYNTHROID, LEVOTHROID) tablet 150 mcg  150 mcg Oral QAC breakfast Allie Bossier, MD   150 mcg at 08/10/13 0845  . loperamide (IMODIUM) capsule 2-4 mg  2-4 mg Oral QID PRN Allie Bossier, MD      . LORazepam (ATIVAN) injection 1 mg  1 mg Intravenous PRN Donne Hazel, MD   1 mg at 08/09/13 2134  . metoprolol tartrate (LOPRESSOR) tablet 25 mg  25 mg Oral BID Allie Bossier, MD   25 mg at 08/10/13 [id number]  . oxyCODONE (Oxy IR/ROXICODONE) immediate release tablet 5 mg  5 mg Oral Q4H PRN Allie Bossier, MD      . pantoprazole (PROTONIX) EC tablet 40 mg  40 mg Oral Daily Allie Bossier, MD   40 mg at 08/10/13 [id number]  . potassium chloride (K-DUR,KLOR-CON) CR tablet 30 mEq  30 mEq Oral Q4H Ritta Slot, NP   30 mEq at 08/10/13 0845  . potassium chloride SA (K-DUR,KLOR-CON) CR tablet 20 mEq  20 mEq Oral q morning - 10a Allie Bossier, MD   20 mEq at 08/10/13 [phone]  . silver sulfADIAZINE (SILVADENE) 1 % cream 1 application  1 application Topical Daily PRN Allie Bossier, MD      . traMADol Veatrice Bourbon) tablet 50-100 mg  50-100 mg Oral TID PRN Allie Bossier, MD      . Warfarin -  Pharmacist Dosing Inpatient   Does not apply [id number] Donne Hazel, MD        Allergies as of 08/06/2013 - Review Complete 08/06/2013  Allergen Reaction Noted  . Ciprofloxacin  08/15/2012  . Gabapentin Nausea And Vomiting 02/04/2013  . Insulin glargine  06/06/2011  . Latex  06/06/2011  . Metformin and related  06/06/2011  . Metoclopramide hcl Nausea And Vomiting 09/27/2006  . Nsaids Nausea And Vomiting 09/27/2006  . Promethazine hcl Other (See Comments) 09/27/2006    Family History  Problem Relation Age of Onset  . Cancer Mother     leukemia  . Coronary artery disease Mother   . Dementia Mother   . Heart disease Father     CAD  . COPD Sister   . Heart disease Brother   . Diabetes Brother   . Kidney disease Son   . Colon cancer Brother     History   Social History  . Marital Status: Married    Spouse Name: N/A    Number of Children: 3  . Years of Education: HS   Occupational History  . Retired     Marketing executive Work   . Retired - office work    Social History Main Topics  . Smoking status: Former Smoker -- 1.00 packs/day for 40 years    Types: Cigarettes  . Smokeless tobacco: Never Used     Comment: 03/07/2013 "quit smoking 35 years ago"  . Alcohol Use: No  . Drug Use: No  . Sexual Activity: Not Currently  Other Topics Concern  . Not on file   Social History Narrative  . No narrative on file    Review of Systems: Ten point ROS is O/W negative except as mentioned in HPI.  Physical Exam: Vital signs in last 24 hours: Temp:  [98.1 F (36.7 C)-98.3 F (36.8 C)] 98.3 F (36.8 C) (01/09 2100) Pulse Rate:  [79] 79 (01/09 2100) Resp:  [18-20] 20 (01/09 2100) BP: (126-152)/(57-84) 152/84 mmHg (01/10 0918) SpO2:  [94 %-99 %] 94 % (01/10 0149) Last BM Date: 08/07/13 General:  Alert, Well-developed, well-nourished, pleasant and cooperative in NAD Head:  Normocephalic and atraumatic. Eyes:  Scleral icterus present. Ears:  Normal auditory acuity. Mouth:  No  deformity or lesions.   Lungs:  Wheezing heard B/L. Heart:  Irregular.  No murmurs noted. Abdomen:  Soft, non-distended.  BS present.  Non-tender.   Rectal:  Deferred  Msk:  Symmetrical without gross deformities. Pulses:  Normal pulses noted. Extremities:  Without clubbing or edema. Neurologic:  Alert and  oriented x4;  grossly normal neurologically. Skin:  Intact without significant lesions or rashes.  Jaundiced.   Psych:  Alert and cooperative. Normal mood and affect.  Intake/Output from previous day: 01/09 0701 - 01/10 0700 In: 500 [P.O.:100; I.V.:400] Out: -   Lab Results:  Recent Labs  08/08/13 0605 08/09/13 0530 08/10/13 0603  WBC 6.7 6.3 5.9  HGB 10.7* 10.6* 11.7*  HCT 33.7* 32.1* 35.0*  PLT 334 318 306   BMET  Recent Labs  08/08/13 0605 08/09/13 0530 08/10/13 0603  NA 141 138 139  K 3.3* 3.4* 2.8*  CL 105 102 97  CO2 22 22 25   GLUCOSE 200* 178* 256*  BUN 20 18 17   CREATININE 1.25* 1.22* 1.31*  CALCIUM 7.9* 7.8* 7.7*   LFT  Recent Labs  08/10/13 0603  PROT 7.1  ALBUMIN 2.7*  AST 70*  ALT 46*  ALKPHOS 372*  BILITOT 12.4*   PT/INR  Recent Labs  08/09/13 0530 08/10/13 0603  LABPROT 54.0* 50.2*  INR 6.46* 5.87*   Hepatitis Panel  Recent Labs  08/09/13 0909  HEPBSAG NEGATIVE  HCVAB NEGATIVE  HEPAIGM NON REACTIVE  HEPBIGM NON REACTIVE    Studies/Results: US Abdomen Limited  08/09/2013   CLINICAL DATA:  Elevated LFTs.  EXAM: US ABDOMEN LIMITED - RIGHT UPPER QUADRANT  COMPARISON:  None.  FINDINGS: Gallbladder  Previous cholecystectomy.  Common bile duct  Diameter: 18.4 mm.  Liver:  No focal lesion identified.  Intrahepatic ductal dilatation noted.  IMPRESSION: 1. Intrahepatic and common bile duct dilatation. Cannot rule out distal common bile duct stone or mass. Consider further evaluation with MRCP.   Electronically Signed   By: Kerby Moors M.D.   On: 08/09/2013 10:49   Dg Chest Port 1 View  08/09/2013   CLINICAL DATA:  Shortness of  breath  EXAM: PORTABLE CHEST - 1 VIEW  COMPARISON:  03/07/2013  FINDINGS: Low lung volumes. Cardiac silhouette is enlarged. There is prominence of the interstitial markings, there is prominence of the interstitial markings and mild peribronchial cuffing. No focal regions of consolidation or focal infiltrates identified. Degenerative changes appreciated within the right and left shoulders.  IMPRESSION: Pulmonary vascular congestion/mild edema. No focal regions of consolidation or focal infiltrates. The findings within the lung parenchyma are accentuated by the low lung volumes.   Electronically Signed   By: Margaree Mackintosh M.D.   On: 08/09/2013 15:55    IMPRESSION:  -Painless obstructive jaundice:  Awaiting MRI/MRCP results to  show source of obstruction.  She is afebrile and does not have a leukocytosis.   -Supratherapeutic INR:  INR 5.87 today. -Atrial fibrillation:  On coumadin. -Asthma -CHF -DM type 2 uncontrolled  PLAN: -Will need ERCP for biliary decompression.  ? Timing.  INR needs to be 1.5 or less.   -? If we should place her on antibiotics now or just give her a dose on-call prior to ERCP.   ZEHR, JESSICA D.  08/10/2013, 11:04 AM  Pager number [phone]   ________________________________________________________________________  [facility] MD note:  I personally examined the patient, reviewed the data and agree with the assessment and plan described above. She is morbidly obese, pretty frail appearing overall.  Obstructive jaundice.  Normally MRCP is always done with MRI abdomen as well however she was not able to tolerate the full exam and so we do not have parenchymal evaluation of the pancreas. MRCP images do suggest mass in head of pancreas.  More cross section imaging will be helpful to best characterize mass, involvement of significant vessels.  I will order CT scan IV and po contrast, pancreatic protocol. She will need ERCP when coags allow. No sign of cholangitis clinically, will  hold off on abx until procedures.    Owens Loffler, MD [facility] Pager [phone]

## 2013-08-10 NOTE — Progress Notes (Signed)
Pr went for MRI.   Per radiology, pt unable to cooperate during procedure due to pt falling asleep.  Radiology will try again on Monday

## 2013-08-11 DIAGNOSIS — K869 Disease of pancreas, unspecified: Secondary | ICD-10-CM

## 2013-08-11 LAB — CBC WITH DIFFERENTIAL/PLATELET
BASOS ABS: 0.1 10*3/uL (ref 0.0–0.1)
Basophils Relative: 1 % (ref 0–1)
Eosinophils Absolute: 0.4 10*3/uL (ref 0.0–0.7)
Eosinophils Relative: 5 % (ref 0–5)
HEMATOCRIT: 34.3 % — AB (ref 36.0–46.0)
HEMOGLOBIN: 11.6 g/dL — AB (ref 12.0–15.0)
LYMPHS ABS: 1.3 10*3/uL (ref 0.7–4.0)
LYMPHS PCT: 18 % (ref 12–46)
MCH: 30.1 pg (ref 26.0–34.0)
MCHC: 33.8 g/dL (ref 30.0–36.0)
MCV: 88.9 fL (ref 78.0–100.0)
MONO ABS: 1.2 10*3/uL — AB (ref 0.1–1.0)
Monocytes Relative: 18 % — ABNORMAL HIGH (ref 3–12)
NEUTROS ABS: 4.1 10*3/uL (ref 1.7–7.7)
Neutrophils Relative %: 58 % (ref 43–77)
Platelets: 340 10*3/uL (ref 150–400)
RBC: 3.86 MIL/uL — AB (ref 3.87–5.11)
RDW: 17.9 % — ABNORMAL HIGH (ref 11.5–15.5)
WBC: 7.1 10*3/uL (ref 4.0–10.5)

## 2013-08-11 LAB — COMPREHENSIVE METABOLIC PANEL
ALBUMIN: 2.5 g/dL — AB (ref 3.5–5.2)
ALT: 44 U/L — ABNORMAL HIGH (ref 0–35)
AST: 71 U/L — ABNORMAL HIGH (ref 0–37)
Alkaline Phosphatase: 356 U/L — ABNORMAL HIGH (ref 39–117)
BILIRUBIN TOTAL: 13.2 mg/dL — AB (ref 0.3–1.2)
BUN: 18 mg/dL (ref 6–23)
CALCIUM: 7.7 mg/dL — AB (ref 8.4–10.5)
CO2: 26 mEq/L (ref 19–32)
CREATININE: 1.34 mg/dL — AB (ref 0.50–1.10)
Chloride: 98 mEq/L (ref 96–112)
GFR calc Af Amer: 40 mL/min — ABNORMAL LOW (ref >90)
GFR calc non Af Amer: 34 mL/min — ABNORMAL LOW (ref >90)
Glucose, Bld: 234 mg/dL — ABNORMAL HIGH (ref 70–99)
Potassium: 3.2 mEq/L — ABNORMAL LOW (ref 3.7–5.3)
Sodium: 138 mEq/L (ref 137–147)
TOTAL PROTEIN: 6.9 g/dL (ref 6.0–8.3)

## 2013-08-11 LAB — GLUCOSE, CAPILLARY
GLUCOSE-CAPILLARY: 200 mg/dL — AB (ref 70–99)
GLUCOSE-CAPILLARY: 251 mg/dL — AB (ref 70–99)
GLUCOSE-CAPILLARY: 253 mg/dL — AB (ref 70–99)
GLUCOSE-CAPILLARY: 329 mg/dL — AB (ref 70–99)
Glucose-Capillary: 191 mg/dL — ABNORMAL HIGH (ref 70–99)
Glucose-Capillary: 239 mg/dL — ABNORMAL HIGH (ref 70–99)
Glucose-Capillary: 288 mg/dL — ABNORMAL HIGH (ref 70–99)

## 2013-08-11 LAB — MAGNESIUM: MAGNESIUM: 1.3 mg/dL — AB (ref 1.5–2.5)

## 2013-08-11 LAB — PROTIME-INR
INR: 5.95 (ref 0.00–1.49)
Prothrombin Time: 50.7 seconds — ABNORMAL HIGH (ref 11.6–15.2)

## 2013-08-11 MED ORDER — PHYTONADIONE 5 MG PO TABS
10.0000 mg | ORAL_TABLET | Freq: Once | ORAL | Status: AC
  Administered 2013-08-11: 10 mg via ORAL
  Filled 2013-08-11: qty 2

## 2013-08-11 NOTE — Progress Notes (Signed)
ANTICOAGULATION CONSULT NOTE - Follow-up Consult  Pharmacy Consult for Coumadin Indication: atrial fibrillation  Allergies  Allergen Reactions  . Ciprofloxacin     Nausea Possibly rash and itching- but not sure   . Gabapentin Nausea And Vomiting  . Insulin Glargine     Itching   . Latex     Rash   . Metformin And Related     Severe diarrhea   . Metoclopramide Hcl Nausea And Vomiting  . Nsaids Nausea And Vomiting  . Promethazine Hcl Other (See Comments)    Reaction unknown    Patient Measurements: Height: 5\' 2"  (157.5 cm) Weight: 244 lb 8 oz (110.904 kg) IBW/kg (Calculated) : 50.1  Vital Signs: Temp: 98.1 F (36.7 C) (01/11 0556) Temp src: Oral (01/11 0556) BP: 151/74 mmHg (01/11 0556) Pulse Rate: 83 (01/11 0556)  Labs:  Recent Labs  08/09/13 0530 08/10/13 0603 08/11/13 0616  HGB 10.6* 11.7* 11.6*  HCT 32.1* 35.0* 34.3*  PLT 318 306 340  LABPROT 54.0* 50.2* 50.7*  INR 6.46* 5.87* 5.95*  CREATININE 1.22* 1.31* 1.34*    Estimated Creatinine Clearance: 34.1 ml/min (by C-G formula based on Cr of 1.34).  Assessment: 80 yo F on chronic Coumadin for Afib; PMH also includes CKD and recent hospitalization for UTI tx with CTX-->cefuroxime, admitted 1/6 from PCP office d/t supratherapeutic INR (15.4 on admission)  INR still supratherapeutic, up slightly from yesterday.  Hgb and Hct stable, pltc WNL.  No overt bleeding reported.   Bilirubin elevated and rising. Dilated bile duct. Suspected stone or mass. MRCP is planned. Probably explains the recent dramatic change in INR response.  Hepatitis panel was negative.  Patient reports multiple Tylenol doses PTA but Tylenol levels were undetectable on 1/9.  Goal of Therapy:  INR 2-3   Plan:   Cont to hold warfarin.  Defer to MD for any Vit. K orders.  Check PT/INR daily.  Romeo Rabon, PharmD, pager [phone]. 08/11/2013,9:23 AM.

## 2013-08-11 NOTE — Progress Notes (Signed)
[facility] Progress Note  Subjective:  Sleepy this AM.  Family was not present when I saw her but they are aware of the mass.  No abdominal pain, nausea, or vomiting.  Objective:  Vital signs in last 24 hours: Temp:  [97.9 F (36.6 C)-98.5 F (36.9 C)] 98.1 F (36.7 C) (01/11 0556) Pulse Rate:  [83-87] 83 (01/11 0556) Resp:  [18-20] 18 (01/11 0556) BP: (131-151)/(54-74) 151/74 mmHg (01/11 0556) SpO2:  [97 %-100 %] 97 % (01/11 0556) FiO2 (%):  [2 %] 2 % (01/10 1421) Last BM Date: 08/07/13 General:  Alert, Well-developed, in NAD; jaundiced. Heart:  Irregular. Pulm:  Wheezing noted B/L. Abdomen:  Soft, obese, non-distended.  BS present.  Non-tender.  Extremities:  Without edema. Neurologic:  Alert and  oriented x4;  grossly normal neurologically. Psych:  Alert and cooperative. Normal mood and affect.  Intake/Output from previous day: 01/10 0701 - 01/11 0700 In: 360 [P.O.:360] Out: -   Lab Results:  Recent Labs  08/09/13 0530 08/10/13 0603 08/11/13 0616  WBC 6.3 5.9 7.1  HGB 10.6* 11.7* 11.6*  HCT 32.1* 35.0* 34.3*  PLT 318 306 340   BMET  Recent Labs  08/09/13 0530 08/10/13 0603 08/11/13 0616  NA 138 139 138  K 3.4* 2.8* 3.2*  CL 102 97 98  CO2 22 25 26   GLUCOSE 178* 256* 234*  BUN 18 17 18   CREATININE 1.22* 1.31* 1.34*  CALCIUM 7.8* 7.7* 7.7*   LFT  Recent Labs  08/11/13 0616  PROT 6.9  ALBUMIN 2.5*  AST 71*  ALT 44*  ALKPHOS 356*  BILITOT 13.2*   PT/INR  Recent Labs  08/10/13 0603 08/11/13 0616  LABPROT 50.2* 50.7*  INR 5.87* 5.95*   Hepatitis Panel  Recent Labs  08/09/13 0909  HEPBSAG NEGATIVE  HCVAB NEGATIVE  HEPAIGM NON REACTIVE  HEPBIGM NON REACTIVE    Ct Abdomen Pelvis W Wo Contrast  08/10/2013   CLINICAL DATA:  Obstructive jaundice, suspected pancreatic mass on MRCP  EXAM: CT ABDOMEN AND PELVIS WITHOUT AND WITH CONTRAST  TECHNIQUE: Multidetector CT imaging of the abdomen and pelvis was performed without  contrast material in one or both body regions, followed by contrast material(s) and further sections in one or both body regions.  CONTRAST:  188mL OMNIPAQUE IOHEXOL 300 MG/ML  SOLN  COMPARISON:  MRI/MRCP dated 08/09/2013  FINDINGS: Scattered in nodularity in the visualized bilateral lungs, measuring up to 9 mm in the lingula (series 9/image 1) and 6 mm in the right lower lobe (series 9/image 10).  Cardiomegaly.  Liver is notable for moderate intrahepatic ductal dilatation.  Spleen and adrenal glands are within normal limits.  2.5 x 3.1 x 2.2 cm hypoenhancing mass in the pancreatic head (series 3/image 54), highly suspicious for pancreatic neoplasm such as adenocarcinoma. Associated atrophy of the pancreatic body/tail with dilatation of the main pancreatic duct measuring up to 7 mm.  Mass does not definitely involve the celiac artery and SMA. However, the portosplenic confluence and SMV is involved, with collateralization/distal reconstitution of the portal vein (series 5/image 29). The mass may tether/involve the distal gastric antrum/duodenal bulb (series 5/image 34).  Status post cholecystectomy. Dilated common duct, measuring up to 2.0 cm (series 5/image 30), with abrupt cut off at the level of the pancreatic mass.  Malrotated right kidney. Left kidney is within normal limits. No renal calculi or hydronephrosis.  No evidence of bowel obstruction.  Prior appendectomy.  Atherosclerotic calcifications of the abdominal aorta and branch vessels.  No abdominopelvic ascites.  10 mm short axis portacaval node (series 3/image 46), at the upper limits of normal.  Status post hysterectomy.  Bilateral ovaries are unremarkable.  Bladder is within normal limits.  Degenerative changes of the visualized thoracolumbar spine. Grade 1 spondylolisthesis at L5-S1.  IMPRESSION: 3.1 cm hypoenhancing mass in the pancreatic head, as described above, highly suspicious for pancreatic neoplasm such as adenocarcinoma.  Mass may  tether/involve the distal gastric antrum/duodenal bulb. Mass involves the portosplenic confluence and SMV. Collateralization/distal reconstitution of the portal vein.  Secondary intrahepatic/extrahepatic ductal dilatation. Common duct measures up to 2.0 cm and abruptly tapers at the level of the mass.  Scattered pulmonary nodules in the visualized lungs, measuring up to 9 mm. Metastatic disease is not excluded.   Electronically Signed   By: Julian Hy M.D.   On: 08/10/2013 21:36   Mr Abdomen Mrcp Wo Cm  08/10/2013   CLINICAL DATA:  Biliary obstruction, elevated LFTs.  EXAM: MRI ABDOMEN WITHOUT  (INCLUDING MRCP)  TECHNIQUE: Multiplanar multisequence MR imaging of the abdomen was performed. Heavily T2-weighted images of the biliary and pancreatic ducts were obtained, and three-dimensional MRCP images were rendered by post processing.  COMPARISON:  Abdominal ultrasound dated 08/09/2013  FINDINGS: Patient was claustrophobic and had been given Ativan, but was uncooperative throughout the study, with significant motion degradation. Study was subsequently aborted and best images were provided for interpretation.  Axial T2 imaging is severely motion degraded and essentially nondiagnostic.  Coronal MRCP imaging demonstrates dilatation of the proximal common duct up to 1.8 cm (series 4/ image 38). Abrupt narrowing/stricture of the mid common duct with shouldering (series 4/image 34). Notably, the distal common duct appears relatively normal, measuring 5-6 mm and is smoothly tapering at the ampulla.  Associated mild to moderate intrahepatic ductal dilatation.  Main pancreatic duct is mildly dilated, measuring 5-6 mm (series 4/image 65), and also abruptly tapers in the region of the pancreatic head/uncinate process.  Notably, the intrahepatic ductal dilatation is new from prior CT chest dated 06/16/2012.  Although not directly visualized, these findings are worrisome for an obstructing mass at the level of the  pancreatic head/uncinate process.  IMPRESSION: Severely motion degraded imaging. Study was aborted prematurely due to difficulty with patient cooperation.  Intrahepatic/extrahepatic ductal dilatation with abrupt narrowing/stricture of the mid common duct. Mild dilatation of the pancreatic duct with abrupt tapering in the region of the pancreatic head/uncinate process.  Although not directly visualized, these findings are worrisome for an obstructing mass at the level of the pancreatic head/uncinate process. Given difficulty with patient cooperation, consider CT abdomen with/without contrast (pancreatic protocol) for further evaluation.  These results were called by telephone at the time of interpretation on 08/10/2013 at 1:20 PM to Dr. Oretha Caprice, who verbally acknowledged these results.   Electronically Signed   By: Julian Hy M.D.   On: 08/10/2013 14:05   Mr 3d Recon At Scanner  08/10/2013   CLINICAL DATA:  Biliary obstruction, elevated LFTs.  EXAM: MRI ABDOMEN WITHOUT  (INCLUDING MRCP)  TECHNIQUE: Multiplanar multisequence MR imaging of the abdomen was performed. Heavily T2-weighted images of the biliary and pancreatic ducts were obtained, and three-dimensional MRCP images were rendered by post processing.  COMPARISON:  Abdominal ultrasound dated 08/09/2013  FINDINGS: Patient was claustrophobic and had been given Ativan, but was uncooperative throughout the study, with significant motion degradation. Study was subsequently aborted and best images were provided for interpretation.  Axial T2 imaging is severely motion degraded and essentially nondiagnostic.  Coronal MRCP imaging demonstrates dilatation of the proximal common duct up to 1.8 cm (series 4/ image 38). Abrupt narrowing/stricture of the mid common duct with shouldering (series 4/image 34). Notably, the distal common duct appears relatively normal, measuring 5-6 mm and is smoothly tapering at the ampulla.  Associated mild to moderate intrahepatic  ductal dilatation.  Main pancreatic duct is mildly dilated, measuring 5-6 mm (series 4/image 65), and also abruptly tapers in the region of the pancreatic head/uncinate process.  Notably, the intrahepatic ductal dilatation is new from prior CT chest dated 06/16/2012.  Although not directly visualized, these findings are worrisome for an obstructing mass at the level of the pancreatic head/uncinate process.  IMPRESSION: Severely motion degraded imaging. Study was aborted prematurely due to difficulty with patient cooperation.  Intrahepatic/extrahepatic ductal dilatation with abrupt narrowing/stricture of the mid common duct. Mild dilatation of the pancreatic duct with abrupt tapering in the region of the pancreatic head/uncinate process.  Although not directly visualized, these findings are worrisome for an obstructing mass at the level of the pancreatic head/uncinate process. Given difficulty with patient cooperation, consider CT abdomen with/without contrast (pancreatic protocol) for further evaluation.  These results were called by telephone at the time of interpretation on 08/10/2013 at 1:20 PM to Dr. Oretha Caprice, who verbally acknowledged these results.   Electronically Signed   By: Julian Hy M.D.   On: 08/10/2013 14:05   Dg Chest Port 1 View  08/09/2013   CLINICAL DATA:  Shortness of breath  EXAM: PORTABLE CHEST - 1 VIEW  COMPARISON:  03/07/2013  FINDINGS: Low lung volumes. Cardiac silhouette is enlarged. There is prominence of the interstitial markings, there is prominence of the interstitial markings and mild peribronchial cuffing. No focal regions of consolidation or focal infiltrates identified. Degenerative changes appreciated within the right and left shoulders.  IMPRESSION: Pulmonary vascular congestion/mild edema. No focal regions of consolidation or focal infiltrates. The findings within the lung parenchyma are accentuated by the low lung volumes.   Electronically Signed   By: Margaree Mackintosh  M.D.   On: 08/09/2013 15:55    Assessment / Plan: -3.1 cm inoperable pancreatic head mass highly suspicious for adenocarcinoma -Painless obstructive jaundice secondary to above.  Total bili 13.2 today. -Supratherapeutic INR: INR 5.95 today.  -Atrial fibrillation: On coumadin.  -Asthma  -CHF  -DM type 2 uncontrolled   -Will need ERCP for biliary decompression/stent placment.  INR needs to be 1.5 or less.  Will give Vitamin K 10 mg PO today.  -No signs of cholangitis so will hold off on anti-biotics until procedure.    LOS: 5 days   ZEHR, JESSICA D.  08/11/2013, 10:55 AM  Pager number [phone]   ________________________________________________________________________  Velora Heckler [facility] MD note:  I personally examined the patient, reviewed the data and agree with the assessment and plan described above.  Likely pancreatic adenocarcinoma, inoperable based on portal vein encasement, recannalization.  She will need ERCP with biliary brushing, stenting. This can be with permanent stent.  Giving Vit K.  Will make NPO after MN for potential ERCP tomorrow.   Owens Loffler, MD [facility] Pager [phone]

## 2013-08-11 NOTE — Progress Notes (Signed)
[facility] PROGRESS NOTE  [name] [id number] DOB: 06-Dec-1922 DOA: 08/06/2013 PCP: Loura Pardon, MD  Assessment/Plan: A-Fib on chronic anticoagulation  -Presented with INR of over 15 s/p 2.5 mg vitamin K, with INR down to 7.7 initially, now trending down off coumadin - No signs of active bleeding - cont to hold coumadin - Pharmacy consulted  - presently rate controlled - INR likely elevated secondary to abnormal liver function - OK to give vitamin K if procedure warranted Supratherapeutic INR  -Per above HTN  -Stable -Cont current regimen -Patient not on ACE/ARB. secondary to poor renal function  HLD  -Obtain lipid panel  -d/c'd statin secondary to elevated LFT's Diabetes type 2 with peripheral neuropathy  -hemoglobin A1c of 8.3 -Manage CBG with moderate SSI while in the hospital  -Make adjustments to patient's medication prior to discharge  Bullous pemphigoid  -Continue home medication  Renal insufficiency  -Avoid all nephrotoxic medication Elevated LFT's - Liver US on 08/09/13 demonstrated intrahepatic and common bile ductdilitation, cannot rule out CBD stone or mass - MRCP was ordered, however per staff, pt was apparently unable to tolerate procedure - Appreciate GI input - Possible ERCP when/if INR better - OK to reverse coumadin if needed - Cont to avoid hepatotoxic drugs Mass at Pancreatic Head - noted on 08/10/13 CT abd - GI following - Possible ERCP - Patient and son made aware of CT findings of pancreatic head mass Hypokalemia - Likely secondary to diuretics that were recently increased - Replaced  Code Status: Full Family Communication: Pt and her son in room  Disposition Plan: Pending   HPI/Subjective: No significant events noted. Remains eager to go home.  Objective: Filed Vitals:   08/10/13 0918 08/10/13 1421 08/10/13 2122 08/11/13 0556  BP: 152/84 135/62 131/54 151/74  Pulse:  87 86 83  Temp:  98.5 F (36.9 C) 97.9 F (36.6 C)  98.1 F (36.7 C)  TempSrc:  Oral Oral Oral  Resp:  20 18 18   Height:      Weight:      SpO2:  100% 99% 97%    Intake/Output Summary (Last 24 hours) at 08/11/13 0858 Last data filed at 08/10/13 1815  Gross per 24 hour  Intake    360 ml  Output      0 ml  Net    360 ml   Filed Weights   08/06/13 1720  Weight: 110.904 kg (244 lb 8 oz)    Exam:   General:  Awake, in nad  Cardiovascular: regular, s1, s2  Respiratory: normal resp effort, no wheezing  Abdomen: soft, nondistended  Musculoskeletal: perfused, no clubbing   Skin: Jaundiced  Data Reviewed: Basic Metabolic Panel:  Recent Labs Lab 08/07/13 0545 08/08/13 0605 08/09/13 0530 08/10/13 0603 08/11/13 0616  NA 143 141 138 139 138  K 3.4* 3.3* 3.4* 2.8* 3.2*  CL 107 105 102 97 98  CO2 21 22 22 25 26   GLUCOSE 220* 200* 178* 256* 234*  BUN 21 20 18 17 18   CREATININE 1.33* 1.25* 1.22* 1.31* 1.34*  CALCIUM 8.0* 7.9* 7.8* 7.7* 7.7*  MG 1.0* 1.0* 0.9* 1.3* 1.3*   Liver Function Tests:  Recent Labs Lab 08/07/13 0545 08/08/13 0605 08/09/13 0530 08/10/13 0603 08/11/13 0616  AST 64* 61* 68* 70* 71*  ALT 47* 46* 44* 46* 44*  ALKPHOS 272* 302* 317* 372* 356*  BILITOT 6.8* 8.2* 9.7* 12.4* 13.2*  PROT 6.4 6.8 6.7 7.1 6.9  ALBUMIN 2.5* 2.6* 2.5* 2.7* 2.5*  No results found for this basename: LIPASE, AMYLASE,  in the last 168 hours  Recent Labs Lab 08/10/13 0843  AMMONIA RESULTS UNAVAILABLE DUE TO INTERFERING SUBSTANCE   CBC:  Recent Labs Lab 08/07/13 0545 08/08/13 0605 08/09/13 0530 08/10/13 0603 08/11/13 0616  WBC 6.3 6.7 6.3 5.9 7.1  NEUTROABS 4.1 4.4 4.0 3.3 4.1  HGB 10.4* 10.7* 10.6* 11.7* 11.6*  HCT 31.4* 33.7* 32.1* 35.0* 34.3*  MCV 91.3 93.6 90.4 91.9 88.9  PLT 348 334 318 306 340   Cardiac Enzymes: No results found for this basename: CKTOTAL, CKMB, CKMBINDEX, TROPONINI,  in the last 168 hours BNP (last 3 results)  Recent Labs  03/07/13 1820  PROBNP 7836.0*   CBG:  Recent  Labs Lab 08/10/13 1623 08/10/13 2107 08/11/13 0014 08/11/13 0418 08/11/13 0736  GLUCAP 267* 191* 253* 239* 200*    No results found for this or any previous visit (from the past 240 hour(s)).   Studies: Ct Abdomen Pelvis W Wo Contrast  08/10/2013   CLINICAL DATA:  Obstructive jaundice, suspected pancreatic mass on MRCP  EXAM: CT ABDOMEN AND PELVIS WITHOUT AND WITH CONTRAST  TECHNIQUE: Multidetector CT imaging of the abdomen and pelvis was performed without contrast material in one or both body regions, followed by contrast material(s) and further sections in one or both body regions.  CONTRAST:  184mL OMNIPAQUE IOHEXOL 300 MG/ML  SOLN  COMPARISON:  MRI/MRCP dated 08/09/2013  FINDINGS: Scattered in nodularity in the visualized bilateral lungs, measuring up to 9 mm in the lingula (series 9/image 1) and 6 mm in the right lower lobe (series 9/image 10).  Cardiomegaly.  Liver is notable for moderate intrahepatic ductal dilatation.  Spleen and adrenal glands are within normal limits.  2.5 x 3.1 x 2.2 cm hypoenhancing mass in the pancreatic head (series 3/image 54), highly suspicious for pancreatic neoplasm such as adenocarcinoma. Associated atrophy of the pancreatic body/tail with dilatation of the main pancreatic duct measuring up to 7 mm.  Mass does not definitely involve the celiac artery and SMA. However, the portosplenic confluence and SMV is involved, with collateralization/distal reconstitution of the portal vein (series 5/image 29). The mass may tether/involve the distal gastric antrum/duodenal bulb (series 5/image 34).  Status post cholecystectomy. Dilated common duct, measuring up to 2.0 cm (series 5/image 30), with abrupt cut off at the level of the pancreatic mass.  Malrotated right kidney. Left kidney is within normal limits. No renal calculi or hydronephrosis.  No evidence of bowel obstruction.  Prior appendectomy.  Atherosclerotic calcifications of the abdominal aorta and branch vessels.  No  abdominopelvic ascites.  10 mm short axis portacaval node (series 3/image 46), at the upper limits of normal.  Status post hysterectomy.  Bilateral ovaries are unremarkable.  Bladder is within normal limits.  Degenerative changes of the visualized thoracolumbar spine. Grade 1 spondylolisthesis at L5-S1.  IMPRESSION: 3.1 cm hypoenhancing mass in the pancreatic head, as described above, highly suspicious for pancreatic neoplasm such as adenocarcinoma.  Mass may tether/involve the distal gastric antrum/duodenal bulb. Mass involves the portosplenic confluence and SMV. Collateralization/distal reconstitution of the portal vein.  Secondary intrahepatic/extrahepatic ductal dilatation. Common duct measures up to 2.0 cm and abruptly tapers at the level of the mass.  Scattered pulmonary nodules in the visualized lungs, measuring up to 9 mm. Metastatic disease is not excluded.   Electronically Signed   By: Julian Hy M.D.   On: 08/10/2013 21:36   Mr Abdomen Mrcp Wo Cm  08/10/2013   CLINICAL DATA:  Biliary obstruction, elevated LFTs.  EXAM: MRI ABDOMEN WITHOUT  (INCLUDING MRCP)  TECHNIQUE: Multiplanar multisequence MR imaging of the abdomen was performed. Heavily T2-weighted images of the biliary and pancreatic ducts were obtained, and three-dimensional MRCP images were rendered by post processing.  COMPARISON:  Abdominal ultrasound dated 08/09/2013  FINDINGS: Patient was claustrophobic and had been given Ativan, but was uncooperative throughout the study, with significant motion degradation. Study was subsequently aborted and best images were provided for interpretation.  Axial T2 imaging is severely motion degraded and essentially nondiagnostic.  Coronal MRCP imaging demonstrates dilatation of the proximal common duct up to 1.8 cm (series 4/ image 38). Abrupt narrowing/stricture of the mid common duct with shouldering (series 4/image 34). Notably, the distal common duct appears relatively normal, measuring 5-6 mm  and is smoothly tapering at the ampulla.  Associated mild to moderate intrahepatic ductal dilatation.  Main pancreatic duct is mildly dilated, measuring 5-6 mm (series 4/image 65), and also abruptly tapers in the region of the pancreatic head/uncinate process.  Notably, the intrahepatic ductal dilatation is new from prior CT chest dated 06/16/2012.  Although not directly visualized, these findings are worrisome for an obstructing mass at the level of the pancreatic head/uncinate process.  IMPRESSION: Severely motion degraded imaging. Study was aborted prematurely due to difficulty with patient cooperation.  Intrahepatic/extrahepatic ductal dilatation with abrupt narrowing/stricture of the mid common duct. Mild dilatation of the pancreatic duct with abrupt tapering in the region of the pancreatic head/uncinate process.  Although not directly visualized, these findings are worrisome for an obstructing mass at the level of the pancreatic head/uncinate process. Given difficulty with patient cooperation, consider CT abdomen with/without contrast (pancreatic protocol) for further evaluation.  These results were called by telephone at the time of interpretation on 08/10/2013 at 1:20 PM to Dr. Oretha Caprice, who verbally acknowledged these results.   Electronically Signed   By: Julian Hy M.D.   On: 08/10/2013 14:05   Mr 3d Recon At Scanner  08/10/2013   CLINICAL DATA:  Biliary obstruction, elevated LFTs.  EXAM: MRI ABDOMEN WITHOUT  (INCLUDING MRCP)  TECHNIQUE: Multiplanar multisequence MR imaging of the abdomen was performed. Heavily T2-weighted images of the biliary and pancreatic ducts were obtained, and three-dimensional MRCP images were rendered by post processing.  COMPARISON:  Abdominal ultrasound dated 08/09/2013  FINDINGS: Patient was claustrophobic and had been given Ativan, but was uncooperative throughout the study, with significant motion degradation. Study was subsequently aborted and best images were  provided for interpretation.  Axial T2 imaging is severely motion degraded and essentially nondiagnostic.  Coronal MRCP imaging demonstrates dilatation of the proximal common duct up to 1.8 cm (series 4/ image 38). Abrupt narrowing/stricture of the mid common duct with shouldering (series 4/image 34). Notably, the distal common duct appears relatively normal, measuring 5-6 mm and is smoothly tapering at the ampulla.  Associated mild to moderate intrahepatic ductal dilatation.  Main pancreatic duct is mildly dilated, measuring 5-6 mm (series 4/image 65), and also abruptly tapers in the region of the pancreatic head/uncinate process.  Notably, the intrahepatic ductal dilatation is new from prior CT chest dated 06/16/2012.  Although not directly visualized, these findings are worrisome for an obstructing mass at the level of the pancreatic head/uncinate process.  IMPRESSION: Severely motion degraded imaging. Study was aborted prematurely due to difficulty with patient cooperation.  Intrahepatic/extrahepatic ductal dilatation with abrupt narrowing/stricture of the mid common duct. Mild dilatation of the pancreatic duct with abrupt tapering in the region of the pancreatic  head/uncinate process.  Although not directly visualized, these findings are worrisome for an obstructing mass at the level of the pancreatic head/uncinate process. Given difficulty with patient cooperation, consider CT abdomen with/without contrast (pancreatic protocol) for further evaluation.  These results were called by telephone at the time of interpretation on 08/10/2013 at 1:20 PM to Dr. Oretha Caprice, who verbally acknowledged these results.   Electronically Signed   By: Julian Hy M.D.   On: 08/10/2013 14:05   US Abdomen Limited  08/09/2013   CLINICAL DATA:  Elevated LFTs.  EXAM: US ABDOMEN LIMITED - RIGHT UPPER QUADRANT  COMPARISON:  None.  FINDINGS: Gallbladder  Previous cholecystectomy.  Common bile duct  Diameter: 18.4 mm.  Liver:  No  focal lesion identified.  Intrahepatic ductal dilatation noted.  IMPRESSION: 1. Intrahepatic and common bile duct dilatation. Cannot rule out distal common bile duct stone or mass. Consider further evaluation with MRCP.   Electronically Signed   By: Kerby Moors M.D.   On: 08/09/2013 10:49   Dg Chest Port 1 View  08/09/2013   CLINICAL DATA:  Shortness of breath  EXAM: PORTABLE CHEST - 1 VIEW  COMPARISON:  03/07/2013  FINDINGS: Low lung volumes. Cardiac silhouette is enlarged. There is prominence of the interstitial markings, there is prominence of the interstitial markings and mild peribronchial cuffing. No focal regions of consolidation or focal infiltrates identified. Degenerative changes appreciated within the right and left shoulders.  IMPRESSION: Pulmonary vascular congestion/mild edema. No focal regions of consolidation or focal infiltrates. The findings within the lung parenchyma are accentuated by the low lung volumes.   Electronically Signed   By: Margaree Mackintosh M.D.   On: 08/09/2013 15:55    Scheduled Meds: . furosemide  60 mg Intravenous Daily  . insulin aspart  0-15 Units Subcutaneous Q4H  . levothyroxine  150 mcg Oral QAC breakfast  . metoprolol  25 mg Oral BID  . pantoprazole  40 mg Oral Daily  . potassium chloride SA  20 mEq Oral q morning - 10a  . Warfarin - Pharmacist Dosing Inpatient   Does not apply q1800   Continuous Infusions: . sodium chloride 20 mL/hr (08/10/13 2257)    Active Problems:   HYPERLIPIDEMIA   HYPERTENSION   Atrial fibrillation   Irritable bowel syndrome   Rosacea   DM type 2 with diabetic peripheral neuropathy   Anemia   History of cerebrovascular disease   Supratherapeutic INR   Pulmonary hypertension   Nonspecific (abnormal) findings on radiological and other examination of biliary tract   Time spent: 55min   Liborio Saccente, [facility] Pager [phone]. If 7PM-7AM, please contact night-coverage at [email or web address], password  [facility] 08/11/2013, 8:58 AM  LOS: 5 days

## 2013-08-11 NOTE — Plan of Care (Signed)
Updated pt's son, Elta Guadeloupe, by phone. Elta Guadeloupe is aware of plan for ERCP tomorrow. He has questions regarding the procedure itself and has asked to be involved in the consent.

## 2013-08-12 ENCOUNTER — Inpatient Hospital Stay (HOSPITAL_COMMUNITY): Payer: Medicare Other

## 2013-08-12 LAB — BASIC METABOLIC PANEL
BUN: 30 mg/dL — ABNORMAL HIGH (ref 6–23)
CO2: 23 mEq/L (ref 19–32)
Calcium: 8.1 mg/dL — ABNORMAL LOW (ref 8.4–10.5)
Chloride: 84 mEq/L — ABNORMAL LOW (ref 96–112)
Creatinine, Ser: 1.63 mg/dL — ABNORMAL HIGH (ref 0.50–1.10)
GFR calc Af Amer: 31 mL/min — ABNORMAL LOW (ref >90)
GFR calc non Af Amer: 27 mL/min — ABNORMAL LOW (ref >90)
Glucose, Bld: 693 mg/dL (ref 70–99)
Potassium: 4.2 mEq/L (ref 3.7–5.3)
Sodium: 128 mEq/L — ABNORMAL LOW (ref 137–147)

## 2013-08-12 LAB — GLUCOSE, CAPILLARY
GLUCOSE-CAPILLARY: 168 mg/dL — AB (ref 70–99)
GLUCOSE-CAPILLARY: 232 mg/dL — AB (ref 70–99)
Glucose-Capillary: 172 mg/dL — ABNORMAL HIGH (ref 70–99)
Glucose-Capillary: 343 mg/dL — ABNORMAL HIGH (ref 70–99)
Glucose-Capillary: 373 mg/dL — ABNORMAL HIGH (ref 70–99)

## 2013-08-12 LAB — COMPREHENSIVE METABOLIC PANEL
ALBUMIN: 2.6 g/dL — AB (ref 3.5–5.2)
ALK PHOS: 375 U/L — AB (ref 39–117)
ALT: 44 U/L — AB (ref 0–35)
AST: 71 U/L — ABNORMAL HIGH (ref 0–37)
BUN: 21 mg/dL (ref 6–23)
CHLORIDE: 93 meq/L — AB (ref 96–112)
CO2: 29 mEq/L (ref 19–32)
Calcium: 8.2 mg/dL — ABNORMAL LOW (ref 8.4–10.5)
Creatinine, Ser: 1.43 mg/dL — ABNORMAL HIGH (ref 0.50–1.10)
GFR calc Af Amer: 37 mL/min — ABNORMAL LOW (ref >90)
GFR calc non Af Amer: 32 mL/min — ABNORMAL LOW (ref >90)
Glucose, Bld: 170 mg/dL — ABNORMAL HIGH (ref 70–99)
POTASSIUM: 3 meq/L — AB (ref 3.7–5.3)
SODIUM: 138 meq/L (ref 137–147)
Total Bilirubin: 14.2 mg/dL — ABNORMAL HIGH (ref 0.3–1.2)
Total Protein: 7.3 g/dL (ref 6.0–8.3)

## 2013-08-12 LAB — CBC WITH DIFFERENTIAL/PLATELET
BASOS ABS: 0.1 10*3/uL (ref 0.0–0.1)
BASOS PCT: 1 % (ref 0–1)
EOS PCT: 5 % (ref 0–5)
Eosinophils Absolute: 0.4 10*3/uL (ref 0.0–0.7)
HEMATOCRIT: 37.8 % (ref 36.0–46.0)
Hemoglobin: 12.7 g/dL (ref 12.0–15.0)
Lymphocytes Relative: 22 % (ref 12–46)
Lymphs Abs: 1.8 10*3/uL (ref 0.7–4.0)
MCH: 30.4 pg (ref 26.0–34.0)
MCHC: 33.6 g/dL (ref 30.0–36.0)
MCV: 90.4 fL (ref 78.0–100.0)
MONO ABS: 1.3 10*3/uL — AB (ref 0.1–1.0)
MONOS PCT: 16 % — AB (ref 3–12)
NEUTROS ABS: 4.7 10*3/uL (ref 1.7–7.7)
Neutrophils Relative %: 57 % (ref 43–77)
Platelets: 357 10*3/uL (ref 150–400)
RBC: 4.18 MIL/uL (ref 3.87–5.11)
RDW: 18.1 % — ABNORMAL HIGH (ref 11.5–15.5)
WBC: 8.2 10*3/uL (ref 4.0–10.5)

## 2013-08-12 LAB — PROTIME-INR
INR: 3.71 — ABNORMAL HIGH (ref 0.00–1.49)
INR: 1.64 — ABNORMAL HIGH (ref 0.00–1.49)
Prothrombin Time: 35.4 seconds — ABNORMAL HIGH (ref 11.6–15.2)
Prothrombin Time: 19 seconds — ABNORMAL HIGH (ref 11.6–15.2)

## 2013-08-12 LAB — ABO/RH: ABO/RH(D): O POS

## 2013-08-12 LAB — CANCER ANTIGEN 19-9: CA 19 9: 2758.4 U/mL — AB (ref <35.0)

## 2013-08-12 LAB — MAGNESIUM: Magnesium: 1.2 mg/dL — ABNORMAL LOW (ref 1.5–2.5)

## 2013-08-12 MED ORDER — DEXTROSE 50 % IV SOLN: 25.0000 mL | INTRAVENOUS | Status: DC | PRN

## 2013-08-12 MED ORDER — DEXTROSE-NACL 5-0.45 % IV SOLN
INTRAVENOUS | Status: DC
  Administered 2013-08-13 – 2013-08-14 (×2): via INTRAVENOUS

## 2013-08-12 MED ORDER — SODIUM CHLORIDE 0.9 % IV SOLN
INTRAVENOUS | Status: DC
  Administered 2013-08-13 (×2): via INTRAVENOUS

## 2013-08-12 MED ORDER — IPRATROPIUM-ALBUTEROL 0.5-2.5 (3) MG/3ML IN SOLN
3.0000 mL | RESPIRATORY_TRACT | Status: DC | PRN
  Filled 2013-08-12: qty 3

## 2013-08-12 MED ORDER — POTASSIUM CHLORIDE CRYS ER 20 MEQ PO TBCR
40.0000 meq | EXTENDED_RELEASE_TABLET | Freq: Every morning | ORAL | Status: DC
  Administered 2013-08-13 – 2013-08-19 (×7): 40 meq via ORAL
  Filled 2013-08-12 (×7): qty 2

## 2013-08-12 MED ORDER — VITAMIN K1 10 MG/ML IJ SOLN
10.0000 mg | Freq: Once | INTRAMUSCULAR | Status: AC
  Administered 2013-08-12: 10 mg via SUBCUTANEOUS
  Filled 2013-08-12: qty 1

## 2013-08-12 MED ORDER — MAGNESIUM SULFATE 40 MG/ML IJ SOLN
2.0000 g | Freq: Once | INTRAMUSCULAR | Status: AC
  Administered 2013-08-12: 2 g via INTRAVENOUS
  Filled 2013-08-12: qty 50

## 2013-08-12 MED ORDER — POTASSIUM CHLORIDE 10 MEQ/100ML IV SOLN
10.0000 meq | INTRAVENOUS | Status: AC
  Administered 2013-08-13 (×2): 10 meq via INTRAVENOUS
  Filled 2013-08-12 (×2): qty 100

## 2013-08-12 MED ORDER — POTASSIUM CHLORIDE 10 MEQ/100ML IV SOLN
10.0000 meq | INTRAVENOUS | Status: AC
  Administered 2013-08-12 (×5): 10 meq via INTRAVENOUS
  Filled 2013-08-12 (×5): qty 100

## 2013-08-12 MED ORDER — SODIUM CHLORIDE 0.9 % IV SOLN
1.5000 g | INTRAVENOUS | Status: DC
  Filled 2013-08-12: qty 1.5

## 2013-08-12 MED ORDER — SODIUM CHLORIDE 0.9 % IV SOLN
INTRAVENOUS | Status: DC
  Administered 2013-08-13: 1.4 [IU]/h via INTRAVENOUS
  Administered 2013-08-13: 5.7 [IU]/h via INTRAVENOUS
  Administered 2013-08-13: 13.9 [IU]/h via INTRAVENOUS
  Administered 2013-08-13: 01:00:00 via INTRAVENOUS
  Administered 2013-08-13: 18.2 [IU]/h via INTRAVENOUS
  Administered 2013-08-13: 19.1 [IU]/h via INTRAVENOUS
  Administered 2013-08-13: 23:00:00 via INTRAVENOUS
  Administered 2013-08-14: 12.3 [IU]/h via INTRAVENOUS
  Administered 2013-08-14: 22.2 [IU]/h via INTRAVENOUS
  Administered 2013-08-14: 11 [IU]/h via INTRAVENOUS
  Filled 2013-08-12 (×6): qty 1

## 2013-08-12 MED ORDER — SODIUM CHLORIDE 0.9 % IV SOLN
INTRAVENOUS | Status: AC
  Administered 2013-08-13: 01:00:00 via INTRAVENOUS

## 2013-08-12 MED ORDER — METHYLPREDNISOLONE SODIUM SUCC 125 MG IJ SOLR
60.0000 mg | Freq: Four times a day (QID) | INTRAMUSCULAR | Status: DC
  Administered 2013-08-12 – 2013-08-13 (×5): 60 mg via INTRAVENOUS
  Filled 2013-08-12 (×8): qty 0.96

## 2013-08-12 MED ORDER — IPRATROPIUM-ALBUTEROL 0.5-2.5 (3) MG/3ML IN SOLN
3.0000 mL | RESPIRATORY_TRACT | Status: DC
  Administered 2013-08-12 – 2013-08-15 (×22): 3 mL via RESPIRATORY_TRACT
  Filled 2013-08-12 (×21): qty 3

## 2013-08-12 NOTE — Progress Notes (Signed)
Progress Note   Subjective  feels okay but endorses SOB.    Objective   Vital signs in last 24 hours: Temp:  [97.4 F (36.3 C)-98.4 F (36.9 C)] 97.4 F (36.3 C) (01/12 0607) Pulse Rate:  [72-90] 85 (01/12 0607) Resp:  [16-18] 18 (01/12 0607) BP: (95-132)/(57-68) 100/57 mmHg (01/12 0607) SpO2:  [96 %-97 %] 96 % (01/12 0607) Last BM Date: 08/11/13 General:    Pleasant obese white female in NAD Heart:  Regular rate and rhythm Lungs: Respirations slightly labored. Rhonchi and wheezing throughout both lungs Abdomen:  Soft, obese, nontender. Normal bowel sounds. Extremities:  No significant lower extremity edema. Neurologic:  Alert and oriented,  grossly normal neurologically. Psych:  Cooperative. Normal mood and affect.  Lab Results:  Recent Labs  08/10/13 0603 08/11/13 0616 08/12/13 0525  WBC 5.9 7.1 8.2  HGB 11.7* 11.6* 12.7  HCT 35.0* 34.3* 37.8  PLT 306 340 357   BMET  Recent Labs  08/10/13 0603 08/11/13 0616 08/12/13 0525  NA 139 138 138  K 2.8* 3.2* 3.0*  CL 97 98 93*  CO2 25 26 29   GLUCOSE 256* 234* 170*  BUN 17 18 21   CREATININE 1.31* 1.34* 1.43*  CALCIUM 7.7* 7.7* 8.2*   LFT  Recent Labs  08/12/13 0525  PROT 7.3  ALBUMIN 2.6*  AST 71*  ALT 44*  ALKPHOS 375*  BILITOT 14.2*   PT/INR  Recent Labs  08/11/13 0616 08/12/13 0525  LABPROT 50.7* 35.4*  INR 5.95* 3.71*    Studies/Results: Ct Abdomen Pelvis W Wo Contrast  08/10/2013   CLINICAL DATA:  Obstructive jaundice, suspected pancreatic mass on MRCP  EXAM: CT ABDOMEN AND PELVIS WITHOUT AND WITH CONTRAST  TECHNIQUE: Multidetector CT imaging of the abdomen and pelvis was performed without contrast material in one or both body regions, followed by contrast material(s) and further sections in one or both body regions.  CONTRAST:  15mL OMNIPAQUE IOHEXOL 300 MG/ML  SOLN  COMPARISON:  MRI/MRCP dated 08/09/2013  FINDINGS: Scattered in nodularity in the visualized bilateral lungs, measuring up  to 9 mm in the lingula (series 9/image 1) and 6 mm in the right lower lobe (series 9/image 10).  Cardiomegaly.  Liver is notable for moderate intrahepatic ductal dilatation.  Spleen and adrenal glands are within normal limits.  2.5 x 3.1 x 2.2 cm hypoenhancing mass in the pancreatic head (series 3/image 54), highly suspicious for pancreatic neoplasm such as adenocarcinoma. Associated atrophy of the pancreatic body/tail with dilatation of the main pancreatic duct measuring up to 7 mm.  Mass does not definitely involve the celiac artery and SMA. However, the portosplenic confluence and SMV is involved, with collateralization/distal reconstitution of the portal vein (series 5/image 29). The mass may tether/involve the distal gastric antrum/duodenal bulb (series 5/image 34).  Status post cholecystectomy. Dilated common duct, measuring up to 2.0 cm (series 5/image 30), with abrupt cut off at the level of the pancreatic mass.  Malrotated right kidney. Left kidney is within normal limits. No renal calculi or hydronephrosis.  No evidence of bowel obstruction.  Prior appendectomy.  Atherosclerotic calcifications of the abdominal aorta and branch vessels.  No abdominopelvic ascites.  10 mm short axis portacaval node (series 3/image 46), at the upper limits of normal.  Status post hysterectomy.  Bilateral ovaries are unremarkable.  Bladder is within normal limits.  Degenerative changes of the visualized thoracolumbar spine. Grade 1 spondylolisthesis at L5-S1.  IMPRESSION: 3.1 cm hypoenhancing mass in the pancreatic head, as  described above, highly suspicious for pancreatic neoplasm such as adenocarcinoma.  Mass may tether/involve the distal gastric antrum/duodenal bulb. Mass involves the portosplenic confluence and SMV. Collateralization/distal reconstitution of the portal vein.  Secondary intrahepatic/extrahepatic ductal dilatation. Common duct measures up to 2.0 cm and abruptly tapers at the level of the mass.  Scattered  pulmonary nodules in the visualized lungs, measuring up to 9 mm. Metastatic disease is not excluded.   Electronically Signed   By: Julian Hy M.D.   On: 08/10/2013 21:36     Assessment / Plan:   47. 80 year old female with biliary obstruction secondary to pancreatic head mass which is suspicious for adenocarcinoma. No signs of cholangitis. Will order pre-procedure antibiotic. I spoke with son Elta Guadeloupe regarding ERCP. Procedure explained to him and the patient. The risks, benefits, and alternatives to ERCP were discussed w and she consents to proceed. Will reassess her in am prior to procedure, her wheezing is concerning. Breathing treatment ordered.   2. Supratherapeutic INR (3.71). I have ordered additional FFP and Vitamin K.  3. Hypokalemia, repletion per hospitalist. Already getting PO lasix. Will need additional potassium and BMET in am.   4. Acute renal failure, creatinine slightly worse this am (1.34 >>>1.43).   5. Wheezing / bilateral rhonchi. Overall this is new for her. Sats 96% on 2liters 02. She has prn breathing treatments ordered. RN will call for treatment now.  6. Multiple medical problems    LOS: 6 days   Tye Savoy  08/12/2013, 8:35 AM      Attending physician's note   I have taken an interval history, reviewed the chart and examined the patient. I agree with the Advanced Practitioner's note, impression and recommendations. ERCP when she is adequately stabilized for sedation and procedure-cardiopulmonary status has improved, INR < 2.0 and hypokalemia corrected.  Pricilla Riffle. Fuller Plan, MD [facility]

## 2013-08-12 NOTE — Progress Notes (Signed)
[facility] PROGRESS NOTE  [name] WUJ:[id number] DOB: [birth year] DOA: 08/06/2013 PCP: Loura Pardon, MD  Assessment/Plan: A-Fib on chronic anticoagulation  - Presented with INR of over 15 s/p 2.5 mg vitamin K, with INR down to 7.7 initially, now trending down off coumadin - No signs of active bleeding - cont to hold coumadin - Pharmacy consulted  - presently rate controlled - INR likely elevated secondary to abnormal liver function - given vit K yesterday, INR 3.7 today - OK to fully anticoagulate if needed. Will defer to GI Supratherapeutic INR  -Per above HTN  -Stable -Cont current regimen -Patient not on ACE/ARB. secondary to poor renal function  HLD  -Obtain lipid panel  -d/c'd statin secondary to elevated LFT's Diabetes type 2 with peripheral neuropathy  -hemoglobin A1c of 8.3 -Manage CBG with moderate SSI while in the hospital  -Make adjustments to patient's medication prior to discharge  Bullous pemphigoid  -Continue home medication  Renal insufficiency  -Avoid all nephrotoxic medication Elevated LFT's - Liver US on 08/09/13 demonstrated intrahepatic and common bile ductdilitation, cannot rule out CBD stone or mass - MRCP was ordered, however per staff, pt was apparently unable to tolerate procedure - Appreciate GI input - Possible ERCP when/if INR better - OK to reverse coumadin if needed - Cont to avoid hepatotoxic drugs Mass at Pancreatic Head - noted on 08/10/13 CT abd - worrisome for adenocarcinoma - Will check ca19-9 - GI following - Possible ERCP when  - Patient and son made aware of CT findings of pancreatic head mass Hypokalemia - Likely secondary to diuretics that were recently increased - Will replace magnesium before correcting potassium Wheezing/SOB - Hx of asthma - Will start scheduled solumedrol with duonebs - Check cxr for interval change - recent pulmonary edema - Will cont IV lasix for now  Code Status: Full Family  Communication: Pt and her son in room  Disposition Plan: Pending   HPI/Subjective: No significant events noted. Pt without complaints.  Objective: Filed Vitals:   08/11/13 1544 08/11/13 2121 08/11/13 2139 08/12/13 0607  BP: 95/57 132/67 132/68 100/57  Pulse: 72 90 88 85  Temp: 97.6 F (36.4 C) 98.4 F (36.9 C)  97.4 F (36.3 C)  TempSrc: Oral Oral  Oral  Resp: 18 16  18   Height:      Weight:      SpO2: 97% 97%  96%   No intake or output data in the 24 hours ending 08/12/13 0758 Filed Weights   08/06/13 1720  Weight: 110.904 kg (244 lb 8 oz)    Exam:   General:  Awake, in nad  Cardiovascular: regular, s1, s2  Respiratory: normal resp effort, coarse BS with end-expiratory wheezing B  Abdomen: soft, nondistended  Musculoskeletal: perfused, no clubbing   Skin: Jaundiced  Data Reviewed: Basic Metabolic Panel:  Recent Labs Lab 08/08/13 0605 08/09/13 0530 08/10/13 0603 08/11/13 0616 08/12/13 0525  NA 141 138 139 138 138  K 3.3* 3.4* 2.8* 3.2* 3.0*  CL 105 102 97 98 93*  CO2 22 22 25 26 29   GLUCOSE 200* 178* 256* 234* 170*  BUN 20 18 17 18 21   CREATININE 1.25* 1.22* 1.31* 1.34* 1.43*  CALCIUM 7.9* 7.8* 7.7* 7.7* 8.2*  MG 1.0* 0.9* 1.3* 1.3* 1.2*   Liver Function Tests:  Recent Labs Lab 08/08/13 0605 08/09/13 0530 08/10/13 0603 08/11/13 0616 08/12/13 0525  AST 61* 68* 70* 71* 71*  ALT 46* 44* 46* 44* 44*  ALKPHOS 302* 317* 372*  356* 375*  BILITOT 8.2* 9.7* 12.4* 13.2* 14.2*  PROT 6.8 6.7 7.1 6.9 7.3  ALBUMIN 2.6* 2.5* 2.7* 2.5* 2.6*   No results found for this basename: LIPASE, AMYLASE,  in the last 168 hours  Recent Labs Lab 08/10/13 0843  AMMONIA RESULTS UNAVAILABLE DUE TO INTERFERING SUBSTANCE   CBC:  Recent Labs Lab 08/08/13 0605 08/09/13 0530 08/10/13 0603 08/11/13 0616 08/12/13 0525  WBC 6.7 6.3 5.9 7.1 8.2  NEUTROABS 4.4 4.0 3.3 4.1 4.7  HGB 10.7* 10.6* 11.7* 11.6* 12.7  HCT 33.7* 32.1* 35.0* 34.3* 37.8  MCV 93.6 90.4  91.9 88.9 90.4  PLT 334 318 306 340 357   Cardiac Enzymes: No results found for this basename: CKTOTAL, CKMB, CKMBINDEX, TROPONINI,  in the last 168 hours BNP (last 3 results)  Recent Labs  03/07/13 1820  PROBNP 7836.0*   CBG:  Recent Labs Lab 08/11/13 1620 08/11/13 2002 08/12/13 08/12/13 0347 08/12/13 0708  GLUCAP 288* 329* 343* 168* 172*    No results found for this or any previous visit (from the past 240 hour(s)).   Studies: Ct Abdomen Pelvis W Wo Contrast  08/10/2013   CLINICAL DATA:  Obstructive jaundice, suspected pancreatic mass on MRCP  EXAM: CT ABDOMEN AND PELVIS WITHOUT AND WITH CONTRAST  TECHNIQUE: Multidetector CT imaging of the abdomen and pelvis was performed without contrast material in one or both body regions, followed by contrast material(s) and further sections in one or both body regions.  CONTRAST:  131mL OMNIPAQUE IOHEXOL 300 MG/ML  SOLN  COMPARISON:  MRI/MRCP dated 08/09/2013  FINDINGS: Scattered in nodularity in the visualized bilateral lungs, measuring up to 9 mm in the lingula (series 9/image 1) and 6 mm in the right lower lobe (series 9/image 10).  Cardiomegaly.  Liver is notable for moderate intrahepatic ductal dilatation.  Spleen and adrenal glands are within normal limits.  2.5 x 3.1 x 2.2 cm hypoenhancing mass in the pancreatic head (series 3/image 54), highly suspicious for pancreatic neoplasm such as adenocarcinoma. Associated atrophy of the pancreatic body/tail with dilatation of the main pancreatic duct measuring up to 7 mm.  Mass does not definitely involve the celiac artery and SMA. However, the portosplenic confluence and SMV is involved, with collateralization/distal reconstitution of the portal vein (series 5/image 29). The mass may tether/involve the distal gastric antrum/duodenal bulb (series 5/image 34).  Status post cholecystectomy. Dilated common duct, measuring up to 2.0 cm (series 5/image 30), with abrupt cut off at the level of the  pancreatic mass.  Malrotated right kidney. Left kidney is within normal limits. No renal calculi or hydronephrosis.  No evidence of bowel obstruction.  Prior appendectomy.  Atherosclerotic calcifications of the abdominal aorta and branch vessels.  No abdominopelvic ascites.  10 mm short axis portacaval node (series 3/image 46), at the upper limits of normal.  Status post hysterectomy.  Bilateral ovaries are unremarkable.  Bladder is within normal limits.  Degenerative changes of the visualized thoracolumbar spine. Grade 1 spondylolisthesis at L5-S1.  IMPRESSION: 3.1 cm hypoenhancing mass in the pancreatic head, as described above, highly suspicious for pancreatic neoplasm such as adenocarcinoma.  Mass may tether/involve the distal gastric antrum/duodenal bulb. Mass involves the portosplenic confluence and SMV. Collateralization/distal reconstitution of the portal vein.  Secondary intrahepatic/extrahepatic ductal dilatation. Common duct measures up to 2.0 cm and abruptly tapers at the level of the mass.  Scattered pulmonary nodules in the visualized lungs, measuring up to 9 mm. Metastatic disease is not excluded.   Electronically Signed  By: Julian Hy M.D.   On: 08/10/2013 21:36    Scheduled Meds: . furosemide  60 mg Intravenous Daily  . insulin aspart  0-15 Units Subcutaneous Q4H  . levothyroxine  150 mcg Oral QAC breakfast  . metoprolol  25 mg Oral BID  . pantoprazole  40 mg Oral Daily  . potassium chloride SA  20 mEq Oral q morning - 10a  . Warfarin - Pharmacist Dosing Inpatient   Does not apply q1800   Continuous Infusions: . sodium chloride 20 mL/hr at 08/11/13 1935    Active Problems:   HYPERLIPIDEMIA   HYPERTENSION   Atrial fibrillation   Irritable bowel syndrome   Rosacea   DM type 2 with diabetic peripheral neuropathy   Anemia   History of cerebrovascular disease   Supratherapeutic INR   Pulmonary hypertension   Nonspecific (abnormal) findings on radiological and other  examination of biliary tract   Time spent: 33min   CHIU, [place] [facility] Pager [phone]. If 7PM-7AM, please contact night-coverage at [email or web address], password [facility] 08/12/2013, 7:58 AM  LOS: 6 days

## 2013-08-12 NOTE — Progress Notes (Signed)
ANTICOAGULATION CONSULT NOTE - Follow-up Consult  Pharmacy Consult for Coumadin Indication: atrial fibrillation  Allergies  Allergen Reactions  . Ciprofloxacin     Nausea Possibly rash and itching- but not sure   . Gabapentin Nausea And Vomiting  . Insulin Glargine     Itching   . Latex     Rash   . Metformin And Related     Severe diarrhea   . Metoclopramide Hcl Nausea And Vomiting  . Nsaids Nausea And Vomiting  . Promethazine Hcl Other (See Comments)    Reaction unknown    Patient Measurements: Height: 5\' 2"  (157.5 cm) Weight: 244 lb 8 oz (110.904 kg) IBW/kg (Calculated) : 50.1  Vital Signs: Temp: 97.4 F (36.3 C) (01/12 0607) Temp src: Oral (01/12 0607) BP: 100/57 mmHg (01/12 0607) Pulse Rate: 85 (01/12 0607)  Labs:  Recent Labs  08/10/13 0603 08/11/13 0616 08/12/13 0525  HGB 11.7* 11.6* 12.7  HCT 35.0* 34.3* 37.8  PLT 306 340 357  LABPROT 50.2* 50.7* 35.4*  INR 5.87* 5.95* 3.71*  CREATININE 1.31* 1.34* 1.43*    Estimated Creatinine Clearance: 31.9 ml/min (by C-G formula based on Cr of 1.43).  Assessment: 80 yo F on chronic Coumadin for Afib; PMH also includes CKD and recent hospitalization for UTI tx with CTX-->cefuroxime, admitted 1/6 from PCP office d/t supratherapeutic INR (15.4 on admission)  INR still supratherapeutic but decreased after Vitamin K 10mg  PO yesterday  Hgb and Hct stable, pltc WNL.  No overt bleeding reported.   Bilirubin elevated and rising - obstructive jaundice secondary to pancreatic head mass. Probably explains the recent dramatic change in INR response.  Plan ERCP for pancreatic mass when INR down  Goal of Therapy:  INR 2-3   Plan:   Cont to hold warfarin.  Check PT/INR daily and f/u when/if to resume warfarin dosing  Peggyann Juba, PharmD, BCPS Pager: [phone] 08/12/2013,7:26 AM.

## 2013-08-13 DIAGNOSIS — Z9181 History of falling: Secondary | ICD-10-CM

## 2013-08-13 LAB — CBC WITH DIFFERENTIAL/PLATELET
BASOS ABS: 0 10*3/uL (ref 0.0–0.1)
BASOS PCT: 0 % (ref 0–1)
EOS ABS: 0 10*3/uL (ref 0.0–0.7)
Eosinophils Relative: 0 % (ref 0–5)
HCT: 32.6 % — ABNORMAL LOW (ref 36.0–46.0)
HEMOGLOBIN: 11.3 g/dL — AB (ref 12.0–15.0)
LYMPHS PCT: 7 % — AB (ref 12–46)
Lymphs Abs: 0.5 10*3/uL — ABNORMAL LOW (ref 0.7–4.0)
MCH: 30.8 pg (ref 26.0–34.0)
MCHC: 34.7 g/dL (ref 30.0–36.0)
MCV: 88.8 fL (ref 78.0–100.0)
MONO ABS: 0.1 10*3/uL (ref 0.1–1.0)
Monocytes Relative: 1 % — ABNORMAL LOW (ref 3–12)
NEUTROS PCT: 92 % — AB (ref 43–77)
Neutro Abs: 7 10*3/uL (ref 1.7–7.7)
PLATELETS: 350 10*3/uL (ref 150–400)
RBC: 3.67 MIL/uL — ABNORMAL LOW (ref 3.87–5.11)
RDW: 18.6 % — AB (ref 11.5–15.5)
WBC: 7.6 10*3/uL (ref 4.0–10.5)

## 2013-08-13 LAB — COMPREHENSIVE METABOLIC PANEL
ALT: 41 U/L — ABNORMAL HIGH (ref 0–35)
AST: 66 U/L — ABNORMAL HIGH (ref 0–37)
Albumin: 2.6 g/dL — ABNORMAL LOW (ref 3.5–5.2)
Alkaline Phosphatase: 340 U/L — ABNORMAL HIGH (ref 39–117)
BUN: 32 mg/dL — AB (ref 6–23)
CALCIUM: 7.8 mg/dL — AB (ref 8.4–10.5)
CO2: 22 meq/L (ref 19–32)
Chloride: 87 mEq/L — ABNORMAL LOW (ref 96–112)
Creatinine, Ser: 1.6 mg/dL — ABNORMAL HIGH (ref 0.50–1.10)
GFR calc Af Amer: 32 mL/min — ABNORMAL LOW (ref >90)
GFR, EST NON AFRICAN AMERICAN: 28 mL/min — AB (ref >90)
Glucose, Bld: 638 mg/dL (ref 70–99)
Potassium: 3.5 mEq/L — ABNORMAL LOW (ref 3.7–5.3)
Sodium: 131 mEq/L — ABNORMAL LOW (ref 137–147)
TOTAL PROTEIN: 7 g/dL (ref 6.0–8.3)
Total Bilirubin: 16.7 mg/dL — ABNORMAL HIGH (ref 0.3–1.2)

## 2013-08-13 LAB — BASIC METABOLIC PANEL
BUN: 33 mg/dL — ABNORMAL HIGH (ref 6–23)
BUN: 32 mg/dL — ABNORMAL HIGH (ref 6–23)
CO2: 23 mEq/L (ref 19–32)
CO2: 24 meq/L (ref 19–32)
CREATININE: 1.57 mg/dL — AB (ref 0.50–1.10)
Calcium: 8.4 mg/dL (ref 8.4–10.5)
Calcium: 8.7 mg/dL (ref 8.4–10.5)
Chloride: 91 mEq/L — ABNORMAL LOW (ref 96–112)
Chloride: 92 mEq/L — ABNORMAL LOW (ref 96–112)
Creatinine, Ser: 1.61 mg/dL — ABNORMAL HIGH (ref 0.50–1.10)
GFR calc Af Amer: 32 mL/min — ABNORMAL LOW (ref >90)
GFR calc Af Amer: 33 mL/min — ABNORMAL LOW (ref >90)
GFR calc non Af Amer: 27 mL/min — ABNORMAL LOW (ref >90)
GFR calc non Af Amer: 28 mL/min — ABNORMAL LOW (ref >90)
GLUCOSE: 181 mg/dL — AB (ref 70–99)
Glucose, Bld: 429 mg/dL — ABNORMAL HIGH (ref 70–99)
Potassium: 3.1 mEq/L — ABNORMAL LOW (ref 3.7–5.3)
Potassium: 3.5 mEq/L — ABNORMAL LOW (ref 3.7–5.3)
Sodium: 136 mEq/L — ABNORMAL LOW (ref 137–147)
Sodium: 134 mEq/L — ABNORMAL LOW (ref 137–147)

## 2013-08-13 LAB — GLUCOSE, CAPILLARY
GLUCOSE-CAPILLARY: 546 mg/dL — AB (ref 70–99)
GLUCOSE-CAPILLARY: 423 mg/dL — AB (ref 70–99)
GLUCOSE-CAPILLARY: 248 mg/dL — AB (ref 70–99)
GLUCOSE-CAPILLARY: 169 mg/dL — AB (ref 70–99)
GLUCOSE-CAPILLARY: 192 mg/dL — AB (ref 70–99)
Glucose-Capillary: >600 mg/dL (ref 70–99)
Glucose-Capillary: 578 mg/dL (ref 70–99)
Glucose-Capillary: 469 mg/dL — ABNORMAL HIGH (ref 70–99)
Glucose-Capillary: 453 mg/dL — ABNORMAL HIGH (ref 70–99)
Glucose-Capillary: 360 mg/dL — ABNORMAL HIGH (ref 70–99)
Glucose-Capillary: 343 mg/dL — ABNORMAL HIGH (ref 70–99)
Glucose-Capillary: 277 mg/dL — ABNORMAL HIGH (ref 70–99)
Glucose-Capillary: 190 mg/dL — ABNORMAL HIGH (ref 70–99)
Glucose-Capillary: 186 mg/dL — ABNORMAL HIGH (ref 70–99)
Glucose-Capillary: 168 mg/dL — ABNORMAL HIGH (ref 70–99)
Glucose-Capillary: 189 mg/dL — ABNORMAL HIGH (ref 70–99)
Glucose-Capillary: 187 mg/dL — ABNORMAL HIGH (ref 70–99)

## 2013-08-13 LAB — PREPARE FRESH FROZEN PLASMA
UNIT DIVISION: 0
Unit division: 0

## 2013-08-13 LAB — MAGNESIUM
MAGNESIUM: 1.4 mg/dL — AB (ref 1.5–2.5)
Magnesium: 2.6 mg/dL — ABNORMAL HIGH (ref 1.5–2.5)

## 2013-08-13 LAB — PROTIME-INR
INR: 1.23 (ref 0.00–1.49)
Prothrombin Time: 15.2 seconds (ref 11.6–15.2)

## 2013-08-13 MED ORDER — AMPICILLIN-SULBACTAM SODIUM 1.5 (1-0.5) G IJ SOLR
1.5000 g | INTRAMUSCULAR | Status: AC
  Administered 2013-08-14: 1.5 g via INTRAVENOUS
  Filled 2013-08-13: qty 1.5

## 2013-08-13 MED ORDER — PREDNISONE 20 MG PO TABS
40.0000 mg | ORAL_TABLET | Freq: Every day | ORAL | Status: DC
  Administered 2013-08-13 – 2013-08-16 (×4): 40 mg via ORAL
  Filled 2013-08-13 (×6): qty 2

## 2013-08-13 MED ORDER — MAGNESIUM SULFATE 40 MG/ML IJ SOLN
2.0000 g | Freq: Once | INTRAMUSCULAR | Status: AC
  Administered 2013-08-13: 2 g via INTRAVENOUS
  Filled 2013-08-13: qty 50

## 2013-08-13 MED ORDER — POTASSIUM CHLORIDE 10 MEQ/100ML IV SOLN
10.0000 meq | INTRAVENOUS | Status: AC
  Administered 2013-08-13 (×2): 10 meq via INTRAVENOUS
  Filled 2013-08-13 (×4): qty 100

## 2013-08-13 MED ORDER — POTASSIUM CHLORIDE CRYS ER 20 MEQ PO TBCR
40.0000 meq | EXTENDED_RELEASE_TABLET | Freq: Once | ORAL | Status: AC
  Administered 2013-08-13: 40 meq via ORAL
  Filled 2013-08-13: qty 2

## 2013-08-13 NOTE — Progress Notes (Signed)
Case cancelled after I reviewed the patient's chart. Pt with significantly uncontrolled blood glucose. Electrolyte abnormalities likely significantly to worsen with correction of hyperglycemia. Pt would benefit from ICU admission and insulin infusion and aggressive fluid and electrolyte management.

## 2013-08-13 NOTE — Telephone Encounter (Signed)
Husband says patient is still in the hospital.

## 2013-08-13 NOTE — Progress Notes (Addendum)
[facility] PROGRESS NOTE  [name] MWU:[id number] DOB: November 07, 1922 DOA: 08/06/2013 PCP: Loura Pardon, MD  Off Service Summary: [phone] with a hx of afib on coumadin who initially presented with coumadin coagulopathy. Work up revealed elevated LFT's Further work up demonstrated a pancreatic head mass. GI was consulted with plans for ERCP. Unfortunately, the patient was noted to have wheezing and cxr findings of pulm edema. Steroids were started which resulted in markedly elevated glucose, now on insulin gtt. Lasix was stopped on 08/13/13 secondary to receiving IVF per insulin gtt protcol. ERCP now delayed tentatively for 08/14/13. INR has since been reversed with vitamin K and multiple units of FFP per GI.  Assessment/Plan: A-Fib on chronic anticoagulation  - Presented with INR of over 15 s/p 2.5 mg vitamin K, with INR down to 7.7 initially, now trending down off coumadin - No signs of active bleeding - cont to hold coumadin - Pharmacy consulted  - presently rate controlled - INR likely elevated secondary to abnormal liver function - INR now less than 1.5 s/p FFP and vit K - Anticoagulating for pending ERCP tomorow Supratherapeutic INR  -Per above HTN  -Stable -Cont current regimen -Patient not on ACE/ARB. secondary to poor renal function  HLD  -Obtain lipid panel  -d/c'd statin secondary to elevated LFT's Diabetes type 2 with peripheral neuropathy  -hemoglobin A1c of 8.3 -Glucose markedly elevated after starting solumedrol for wheezing on exam - No on insulin gtt - Calculated Anion GAP of 22  - Will d/c fluids and cont on ivf per DKA protocol Bullous pemphigoid  -stable Renal insufficiency  -Avoid all nephrotoxic medication Elevated LFT's - Liver US on 08/09/13 demonstrated intrahepatic and common bile ductdilitation, cannot rule out CBD stone or mass - MRCP was ordered, however per staff, pt was apparently unable to tolerate procedure - Appreciate GI input - Awaiting  ERCP tomorrow hopefully - Cont to avoid hepatotoxic drugs Mass at Pancreatic Head - noted on 08/10/13 CT abd - worrisome for adenocarcinoma - Ca19-9 elevated at 2,758 - GI following - Possible ERCP tomorrow if stable - Patient and son made aware of CT findings of pancreatic head mass Hypokalemia - Likely secondary to diuretics that were recently increased - Will replace magnesium before correcting potassium Wheezing/SOB - Hx of asthma - Started scheduled solumedrol with duonebs, however given markedly elevated glucose, would decrease to PO prednisone - Check cxr for interval change - recent pulmonary edema  Code Status: Full Family Communication: Pt and her son in room  Disposition Plan: Pending   HPI/Subjective: Reports feeling better. No complaints. Denies SOB  Objective: Filed Vitals:   08/13/13 0106 08/13/13 0531 08/13/13 0618 08/13/13 0740  BP:   108/65   Pulse:   100   Temp:   97.5 F (36.4 C)   TempSrc:   Oral   Resp:      Height:      Weight:      SpO2: 95% 95% 98% 99%    Intake/Output Summary (Last 24 hours) at 08/13/13 1029 Last data filed at 08/12/13 1855  Gross per 24 hour  Intake   1145 ml  Output      0 ml  Net   1145 ml   Filed Weights   08/06/13 1720  Weight: 110.904 kg (244 lb 8 oz)    Exam:   General:  Awake, in nad  Cardiovascular: regular, s1, s2  Respiratory: normal resp effort, end-expiratory wheezing B, improved from yesterday  Abdomen: soft, nondistended  Musculoskeletal: perfused, no clubbing   Skin: Jaundiced  Data Reviewed: Basic Metabolic Panel:  Recent Labs Lab 08/09/13 0530 08/10/13 0603 08/11/13 0616 08/12/13 0525 08/12/13 2207 08/13/13 0315 08/13/13 0755  NA 138 139 138 138 128* 131* 136*  K 3.4* 2.8* 3.2* 3.0* 4.2 3.5* 3.1*  CL 102 97 98 93* 84* 87* 91*  CO2 22 25 26 29 23 22 23   GLUCOSE 178* 256* 234* 170* 693* 638* 429*  BUN 18 17 18 21  30* 32* 33*  CREATININE 1.22* 1.31* 1.34* 1.43* 1.63* 1.60*  1.61*  CALCIUM 7.8* 7.7* 7.7* 8.2* 8.1* 7.8* 8.4  MG 0.9* 1.3* 1.3* 1.2*  --  1.4*  --    Liver Function Tests:  Recent Labs Lab 08/09/13 0530 08/10/13 0603 08/11/13 0616 08/12/13 0525 08/13/13 0315  AST 68* 70* 71* 71* 66*  ALT 44* 46* 44* 44* 41*  ALKPHOS 317* 372* 356* 375* 340*  BILITOT 9.7* 12.4* 13.2* 14.2* 16.7*  PROT 6.7 7.1 6.9 7.3 7.0  ALBUMIN 2.5* 2.7* 2.5* 2.6* 2.6*   No results found for this basename: LIPASE, AMYLASE,  in the last 168 hours  Recent Labs Lab 08/10/13 0843  AMMONIA RESULTS UNAVAILABLE DUE TO INTERFERING SUBSTANCE   CBC:  Recent Labs Lab 08/09/13 0530 08/10/13 0603 08/11/13 0616 08/12/13 0525 08/13/13 0315  WBC 6.3 5.9 7.1 8.2 7.6  NEUTROABS 4.0 3.3 4.1 4.7 7.0  HGB 10.6* 11.7* 11.6* 12.7 11.3*  HCT 32.1* 35.0* 34.3* 37.8 32.6*  MCV 90.4 91.9 88.9 90.4 88.8  PLT 318 306 340 357 350   Cardiac Enzymes: No results found for this basename: CKTOTAL, CKMB, CKMBINDEX, TROPONINI,  in the last 168 hours BNP (last 3 results)  Recent Labs  03/07/13 1820  PROBNP 7836.0*   CBG:  Recent Labs Lab 08/13/13 0504 08/13/13 0610 08/13/13 0711 08/13/13 0813 08/13/13 0932  GLUCAP 469* 453* 423* 360* 343*    No results found for this or any previous visit (from the past 240 hour(s)).   Studies: Dg Chest Port 1 View  08/12/2013   CLINICAL DATA:  80 year old female shortness of breath. Initial encounter.  EXAM: PORTABLE CHEST - 1 VIEW  COMPARISON:  DG CHEST 1V PORT dated 08/09/2013; DG CHEST 1V PORT dated 03/07/2013 And earlier.  FINDINGS: Portable AP upright view at 0933 hr. Lung volumes not significantly changed. Stable cardiomegaly and mediastinal contours. No pneumothorax. Stable increased interstitial markings, no overt edema. No pleural effusion or consolidation. No acute pulmonary opacity identified.  IMPRESSION: Stable. No acute cardiopulmonary abnormality.   Electronically Signed   By: Lars Pinks M.D.   On: 08/12/2013 09:42    Scheduled  Meds: . ampicillin-sulbactam (UNASYN) IV  1.5 g Intravenous 60 min Pre-Op  . ipratropium-albuterol  3 mL Nebulization Q4H  . levothyroxine  150 mcg Oral QAC breakfast  . metoprolol  25 mg Oral BID  . pantoprazole  40 mg Oral Daily  . potassium chloride SA  40 mEq Oral q morning - 10a  . predniSONE  40 mg Oral Q breakfast  . Warfarin - Pharmacist Dosing Inpatient   Does not apply q1800   Continuous Infusions: . sodium chloride 20 mL/hr at 08/11/13 1935  . sodium chloride 125 mL/hr at 08/13/13 0802  . dextrose 5 % and 0.45% NaCl    . insulin (NOVOLIN-R) infusion 19.8 Units/hr (08/13/13 0935)    Active Problems:   HYPERLIPIDEMIA   HYPERTENSION   Atrial fibrillation   Irritable bowel syndrome   Rosacea   DM  type 2 with diabetic peripheral neuropathy   Anemia   History of cerebrovascular disease   Supratherapeutic INR   Pulmonary hypertension   Nonspecific (abnormal) findings on radiological and other examination of biliary tract  Time spent: 63min  Yaslyn Cumby, [place] [facility] Pager [phone]. If 7PM-7AM, please contact night-coverage at [email or web address], password [facility] 08/13/2013, 10:29 AM  LOS: 7 days

## 2013-08-13 NOTE — Progress Notes (Signed)
    Progress Note   Subjective  Feels okay. Talkative   Objective   Vital signs in last 24 hours: Temp:  [97.5 F (36.4 C)-98.5 F (36.9 C)] 97.5 F (36.4 C) (01/13 0618) Pulse Rate:  [80-118] 100 (01/13 0618) Resp:  [17-18] 18 (01/12 2245) BP: (108-139)/(58-96) 108/65 mmHg (01/13 0618) SpO2:  [93 %-100 %] 99 % (01/13 0740) Last BM Date: 08/11/13  General:    white female in NAD Heart:  Regular rate and rhythm; no murmurs Lungs: Respirations even and unlabored, still with diffuse wheezing.  Abdomen:  Soft, nontender and nondistended. Normal bowel sounds. Extremities:  Without edema. Neurologic:  Alert and oriented,  grossly normal neurologically. Psych:  Cooperative. Normal mood and affect.    Lab Results:  Recent Labs  08/11/13 0616 08/12/13 0525 08/13/13 0315  WBC 7.1 8.2 7.6  HGB 11.6* 12.7 11.3*  HCT 34.3* 37.8 32.6*  PLT 340 357 350   BMET  Recent Labs  08/12/13 2207 08/13/13 0315 08/13/13 0755  NA 128* 131* 136*  K 4.2 3.5* 3.1*  CL 84* 87* 91*  CO2 23 22 23   GLUCOSE 693* 638* 429*  BUN 30* 32* 33*  CREATININE 1.63* 1.60* 1.61*  CALCIUM 8.1* 7.8* 8.4   LFT  Recent Labs  08/13/13 0315  PROT 7.0  ALBUMIN 2.6*  AST 66*  ALT 41*  ALKPHOS 340*  BILITOT 16.7*   PT/INR  Recent Labs  08/12/13 1809 08/13/13 0315  LABPROT 19.0* 15.2  INR 1.64* 1.23    Studies/Results: Dg Chest Port 1 View  08/12/2013   CLINICAL DATA:  80 year old female shortness of breath. Initial encounter.  EXAM: PORTABLE CHEST - 1 VIEW  COMPARISON:  DG CHEST 1V PORT dated 08/09/2013; DG CHEST 1V PORT dated 03/07/2013 And earlier.  FINDINGS: Portable AP upright view at 0933 hr. Lung volumes not significantly changed. Stable cardiomegaly and mediastinal contours. No pneumothorax. Stable increased interstitial markings, no overt edema. No pleural effusion or consolidation. No acute pulmonary opacity identified.  IMPRESSION: Stable. No acute cardiopulmonary abnormality.    Electronically Signed   By: Lars Pinks M.D.   On: 08/12/2013 09:42     Assessment / Plan:   65. 80 year old female with biliary obstruction secondary to pancreatic head mass which is suspicious for adenocarcinoma. No signs of cholangitis. ERCP cancelled by anesthesia for today secondary to hyperglycemia. Rescheduled for am. Diet resumed.  2. Acute renal failure  3. Wheezing.  Overall this is new for her. CXR yesterday negative. Sats okay on 3 liters 02.   4. Hypokalemia, resolved.  5. Hyperglycemia, started solumedrol yesterday. Hospitalist managing.  6. Coagulopathy, resolved. INR 1.23    LOS: 7 days   Tye Savoy  08/13/2013, 10:12 AM     Attending physician's note   I have taken an interval history, reviewed the chart and examined the patient. I agree with the Advanced Practitioner's note, impression and recommendations. Control of hyperglycemia today. Reschedule for ERCP tomorrow at 0830.   Pricilla Riffle. Fuller Plan, MD [facility]

## 2013-08-13 NOTE — Progress Notes (Signed)
Inpatient Diabetes Program Recommendations  AACE/ADA: New Consensus Statement on Inpatient Glycemic Control (2013)  Target Ranges:  Prepandial:   less than 140 mg/dL      Peak postprandial:   less than 180 mg/dL (1-2 hours)      Critically ill patients:  140 - 180 mg/dL   Reason for Visit: Hyperglycemia  Results for TWILIA, YAKLIN (MRN [id number]) as of 08/13/2013 14:08  Ref. Range 08/12/2013 21:26 08/12/2013 21:28 08/13/2013 01:42 08/13/2013 02:49 08/13/2013 02:54 08/13/2013 04:00 08/13/2013 05:04 08/13/2013 06:10 08/13/2013 07:11 08/13/2013 08:13 08/13/2013 09:32 08/13/2013 10:43 08/13/2013 11:48 08/13/2013 13:36  Glucose-Capillary Latest Range: 70-99 mg/dL >600 (HH) >600 (HH) 578 (HH) >600 (HH) >600 (HH) 546 (H) 469 (H) 453 (H) 423 (H) 360 (H) 343 (H) 277 (H) 248 (H) 190 (H)   Hyperglycemia with addition of solumedrol. GlucoStabilizer for IV insulin started. When ready to transition back to SQ insulin, please give Lantus 1-2 hours prior to discontinuation of drip. Use Novolog correction when drip is discontinued. Pt is on 70/30 50 units in am and 42 units in pm. Therefore pt is receiving a total of 64 units basal insulin and approx 42 units of bolus insulin. Would therefore recommend transitioning to Lantus 50 units Q24 hours and Novolog 10 units tidwc for meal coverage insulin (approx 20% less than home doseages)  Will continue to follow. Thank you. Lorenda Peck, RD, LDN, CDE Inpatient Diabetes Coordinator [phone]

## 2013-08-13 NOTE — Care Management Note (Addendum)
    Page 1 of 1   08/16/2013     4:24:18 PM   CARE MANAGEMENT NOTE 08/16/2013  Patient:  [name], [name]   Account Number:  [id number]  Date Initiated:  08/07/2013  Documentation initiated by:  [facility]  Subjective/Objective Assessment:   80 year old female admitted with supratherapeutic INR.     Action/Plan:   From home. Not active with any homehealth services at this time. PT/OT recommended [facility] services last admissio but they were not ordered. Will assess at time of d/c to see if services will be needed.   Anticipated DC Date:  08/19/2013   Anticipated DC Plan:  [place]  CM consult      Choice offered to / List presented to:  [name]           Status of service:  In process, will continue to follow Medicare Important Message given?  NA - LOS <3 / Initial given by admissions (If response is "NO", the following Medicare IM given date fields will be blank) Date Medicare IM given:   Date Additional Medicare IM given:    Discharge Disposition:    Per UR Regulation:  Reviewed for med. necessity/level of care/duration of stay  If discussed at [place] of Stay Meetings, dates discussed:   08/13/2013    Comments:  08/16/13 Sydnee Lamour RN,BSN NCM [phone].PROVIDED PATIENT W/HOME HOSPICE LIST.CURRENTLY ON 02. TRANSFER FROM SDU.DNR.AFIB,SUPRA THERAPEUTIC INR,PULMONARY EDEMA,PANCREATIC MASS.DM ED FOLLOWING.RD-POOR PO INTAKE,GI-SIGNED OFF.NOTED PALLIATIVE CONS.[facility] KRISTEN REP ALREADY FOLLOWING FOR HHRN IF NEEDED.[facility] FOLLOWING BUT PATIENT HAS NOT MADE A COMMITTMENT TO [facility].

## 2013-08-13 NOTE — Progress Notes (Signed)
This shift pts 8pm CBG READING > 600. Hospitalist notified and labs ordered. Received critical value glucose 695. Glucose stabilizer initiated per attending w/ 2 runs K+, change in fluids and cardiac monitoring. Magnesium ordered for periods of unsustained VTACH, pt was in no distress and asymptomatic. Will continue to monitor pt and provide necessary interventions.

## 2013-08-13 NOTE — Anesthesia Preprocedure Evaluation (Addendum)
Anesthesia Evaluation  Patient identified by MRN, date of birth, ID band Patient awake    Reviewed: Allergy & Precautions, H&P , NPO status , Patient's Chart, lab work & pertinent test results, reviewed documented beta blocker date and time   History of Anesthesia Complications (+) PONV  Airway Mallampati: II TM Distance: >3 FB Neck ROM: full    Dental  (+) Teeth Intact and Dental Advisory Given   Pulmonary shortness of breath, asthma , former smoker,  breath sounds clear to auscultation  Pulmonary exam normal       Cardiovascular hypertension, Pt. on home beta blockers and Pt. on medications +CHF + dysrhythmias Atrial Fibrillation Rhythm:regular Rate:Normal     Neuro/Psych negative neurological ROS  negative psych ROS   GI/Hepatic negative GI ROS, Neg liver ROS, hiatal hernia, GERD-  Medicated,  Endo/Other  diabetes, Poorly Controlled, Type 2Hypothyroidism Morbid obesity  Renal/GU Renal InsufficiencyRenal disease  negative genitourinary   Musculoskeletal negative musculoskeletal ROS (+)   Abdominal (+) + obese,   Peds negative pediatric ROS (+)  Hematology negative hematology ROS (+) Blood dyscrasia, anemia ,   Anesthesia Other Findings   Reproductive/Obstetrics negative OB ROS                      Anesthesia Physical Anesthesia Plan  ASA: III  Anesthesia Plan: General   Post-op Pain Management:    Induction: Intravenous  Airway Management Planned: Oral ETT  Additional Equipment:   Intra-op Plan:   Post-operative Plan: Extubation in OR  Informed Consent: I have reviewed the patients History and Physical, chart, labs and discussed the procedure including the risks, benefits and alternatives for the proposed anesthesia with the patient or authorized representative who has indicated his/her understanding and acceptance.   Dental Advisory Given  Plan Discussed with: CRNA and  Surgeon  Anesthesia Plan Comments:         Anesthesia Quick Evaluation

## 2013-08-13 NOTE — Clinical Documentation Improvement (Signed)
Possible Clinical Conditions?   __________HypoNatremia     __________CKF stage______   Please list                             Other Condition___________________                 Cannot Clinically Determine_________  Component     Latest Ref Rng 08/12/2013 08/12/2013 08/13/2013 08/13/2013         5:25 AM 10:07 PM  3:15 AM  7:55 AM  Sodium     137 - 147 mEq/L 138 128 (L) 131 (L) 136 (L)  Potassium     3.7 - 5.3 mEq/L 3.0 (L) 4.2 3.5 (L) 3.1 (L)  Chloride     96 - 112 mEq/L 93 (L) 84 (L) 87 (L) 91 (L)  CO2     19 - 32 mEq/L 29 23 22 23   Glucose     70 - 99 mg/dL 170 (H) 693 (HH) 638 (HH) 429 (H)  BUN     6 - 23 mg/dL 21 30 (H) 32 (H) 33 (H)  Creatinine     0.50 - 1.10 mg/dL 1.43 (H) 1.63 (H) 1.60 (H) 1.61 (H)  Calcium     8.4 - 10.5 mg/dL 8.2 (L) 8.1 (L) 7.8 (L) 8.4  GFR calc non Af Amer     >90 mL/min 32 (L) 27 (L) 28 (L) 27 (L)  GFR calc Af Amer     >90 mL/min 37 (L) 31 (L) 32 (L) 32 (L)   Supporting Information: Risk Factors: DM, CKD,HTN  Treatment: IVF: NS @ 118ml/hr continuous  Thank You, Philippa Chester ,RN Clinical Documentation Specialist:  [facility] [employer] [facility]

## 2013-08-13 NOTE — Progress Notes (Signed)
Received referral from long length of stay meeting for [facility] [employer] services. Spoke with patient and son, [name], at bedside to explain [facility] [employer] services. Patient stated she was not sure if she needed [facility] [employer] since she has a Charity fundraiser that calls her monthly. She reports she follows up closely with her doctors as well. She did not want to make a decision today. Her son states he will take a brochure and card and will call this writer if they decide to sign up with [facility] [employer] services. Made inpatient RNCM aware of meeting with patient. Will continue to follow along and revisit if patient if patient changes mind while hospitalized.  Marthenia Rolling, MSN, RN,BSN- [facility] [phone]

## 2013-08-13 NOTE — Progress Notes (Addendum)
Pt had a 6 beat run and 8 beat run of vtach this am.  Pt asymptomatic.  Notified MD. New orders placed.

## 2013-08-14 ENCOUNTER — Encounter (HOSPITAL_COMMUNITY): Payer: Medicare Other | Admitting: Anesthesiology

## 2013-08-14 ENCOUNTER — Encounter (HOSPITAL_COMMUNITY)
Admission: EM | Disposition: A | Discharge status: Hospice | Payer: Self-pay | Source: Home / Self Care | Attending: Internal Medicine

## 2013-08-14 ENCOUNTER — Inpatient Hospital Stay (HOSPITAL_COMMUNITY): Payer: Medicare Other

## 2013-08-14 ENCOUNTER — Encounter (HOSPITAL_COMMUNITY): Payer: Self-pay | Admitting: *Deleted

## 2013-08-14 ENCOUNTER — Inpatient Hospital Stay (HOSPITAL_COMMUNITY): Payer: Medicare Other | Admitting: Anesthesiology

## 2013-08-14 DIAGNOSIS — K831 Obstruction of bile duct: Secondary | ICD-10-CM

## 2013-08-14 DIAGNOSIS — J9602 Acute respiratory failure with hypercapnia: Secondary | ICD-10-CM | POA: Diagnosis not present

## 2013-08-14 HISTORY — PX: ERCP: SHX5425

## 2013-08-14 LAB — BLOOD GAS, ARTERIAL
Acid-base deficit: 1.2 mmol/L (ref 0.0–2.0)
BICARBONATE: 26.5 meq/L — AB (ref 20.0–24.0)
DRAWN BY: 307971
FIO2: 1 %
O2 SAT: 99.6 %
PATIENT TEMPERATURE: 98.6
PH ART: 7.255 — AB (ref 7.350–7.450)
TCO2: 24.8 mmol/L (ref 0–100)
pCO2 arterial: 62 mmHg (ref 35.0–45.0)
pO2, Arterial: 421 mmHg — ABNORMAL HIGH (ref 80.0–100.0)

## 2013-08-14 LAB — MAGNESIUM: MAGNESIUM: 2.4 mg/dL (ref 1.5–2.5)

## 2013-08-14 LAB — GLUCOSE, CAPILLARY
GLUCOSE-CAPILLARY: 187 mg/dL — AB (ref 70–99)
GLUCOSE-CAPILLARY: 173 mg/dL — AB (ref 70–99)
GLUCOSE-CAPILLARY: 157 mg/dL — AB (ref 70–99)
GLUCOSE-CAPILLARY: 121 mg/dL — AB (ref 70–99)
GLUCOSE-CAPILLARY: 110 mg/dL — AB (ref 70–99)
Glucose-Capillary: 149 mg/dL — ABNORMAL HIGH (ref 70–99)
Glucose-Capillary: 163 mg/dL — ABNORMAL HIGH (ref 70–99)
Glucose-Capillary: 207 mg/dL — ABNORMAL HIGH (ref 70–99)
Glucose-Capillary: 146 mg/dL — ABNORMAL HIGH (ref 70–99)
Glucose-Capillary: 147 mg/dL — ABNORMAL HIGH (ref 70–99)
Glucose-Capillary: 150 mg/dL — ABNORMAL HIGH (ref 70–99)
Glucose-Capillary: 141 mg/dL — ABNORMAL HIGH (ref 70–99)
Glucose-Capillary: 147 mg/dL — ABNORMAL HIGH (ref 70–99)
Glucose-Capillary: 143 mg/dL — ABNORMAL HIGH (ref 70–99)
Glucose-Capillary: 133 mg/dL — ABNORMAL HIGH (ref 70–99)
Glucose-Capillary: 126 mg/dL — ABNORMAL HIGH (ref 70–99)
Glucose-Capillary: 173 mg/dL — ABNORMAL HIGH (ref 70–99)
Glucose-Capillary: 208 mg/dL — ABNORMAL HIGH (ref 70–99)
Glucose-Capillary: 142 mg/dL — ABNORMAL HIGH (ref 70–99)
Glucose-Capillary: 96 mg/dL (ref 70–99)
Glucose-Capillary: 122 mg/dL — ABNORMAL HIGH (ref 70–99)

## 2013-08-14 LAB — BASIC METABOLIC PANEL
BUN: 32 mg/dL — ABNORMAL HIGH (ref 6–23)
CALCIUM: 8.4 mg/dL (ref 8.4–10.5)
CO2: 24 mEq/L (ref 19–32)
CREATININE: 1.25 mg/dL — AB (ref 0.50–1.10)
Chloride: 99 mEq/L (ref 96–112)
GFR calc non Af Amer: 37 mL/min — ABNORMAL LOW (ref >90)
GFR, EST AFRICAN AMERICAN: 43 mL/min — AB (ref >90)
Glucose, Bld: 128 mg/dL — ABNORMAL HIGH (ref 70–99)
Potassium: 3.9 mEq/L (ref 3.7–5.3)
SODIUM: 136 meq/L — AB (ref 137–147)

## 2013-08-14 LAB — POCT I-STAT 7, (LYTES, BLD GAS, ICA,H+H)
Acid-base deficit: 1 mmol/L (ref 0.0–2.0)
Bicarbonate: 28.6 mEq/L — ABNORMAL HIGH (ref 20.0–24.0)
Calcium, Ion: 1.18 mmol/L (ref 1.13–1.30)
HCT: 41 % (ref 36.0–46.0)
Hemoglobin: 13.9 g/dL (ref 12.0–15.0)
O2 SAT: 100 %
PCO2 ART: 72.4 mmHg — AB (ref 35.0–45.0)
POTASSIUM: 3.6 meq/L — AB (ref 3.7–5.3)
Patient temperature: 98
SODIUM: 141 meq/L (ref 137–147)
TCO2: 31 mmol/L (ref 0–100)
pH, Arterial: 7.202 — ABNORMAL LOW (ref 7.350–7.450)
pO2, Arterial: 348 mmHg — ABNORMAL HIGH (ref 80.0–100.0)

## 2013-08-14 LAB — CBC WITH DIFFERENTIAL/PLATELET
BASOS ABS: 0 10*3/uL (ref 0.0–0.1)
BASOS PCT: 0 % (ref 0–1)
Eosinophils Absolute: 0 10*3/uL (ref 0.0–0.7)
Eosinophils Relative: 0 % (ref 0–5)
HCT: 34.9 % — ABNORMAL LOW (ref 36.0–46.0)
Hemoglobin: 11.7 g/dL — ABNORMAL LOW (ref 12.0–15.0)
Lymphocytes Relative: 4 % — ABNORMAL LOW (ref 12–46)
Lymphs Abs: 0.8 10*3/uL (ref 0.7–4.0)
MCH: 30.2 pg (ref 26.0–34.0)
MCHC: 33.5 g/dL (ref 30.0–36.0)
MCV: 90.2 fL (ref 78.0–100.0)
Monocytes Absolute: 1.1 10*3/uL — ABNORMAL HIGH (ref 0.1–1.0)
Monocytes Relative: 6 % (ref 3–12)
NEUTROS ABS: 17.3 10*3/uL — AB (ref 1.7–7.7)
Neutrophils Relative %: 90 % — ABNORMAL HIGH (ref 43–77)
PLATELETS: 353 10*3/uL (ref 150–400)
RBC: 3.87 MIL/uL (ref 3.87–5.11)
RDW: 18.6 % — AB (ref 11.5–15.5)
WBC: 19.3 10*3/uL — ABNORMAL HIGH (ref 4.0–10.5)

## 2013-08-14 LAB — COMPREHENSIVE METABOLIC PANEL
ALK PHOS: 347 U/L — AB (ref 39–117)
ALT: 52 U/L — ABNORMAL HIGH (ref 0–35)
AST: 103 U/L — ABNORMAL HIGH (ref 0–37)
Albumin: 2.6 g/dL — ABNORMAL LOW (ref 3.5–5.2)
BILIRUBIN TOTAL: 19 mg/dL — AB (ref 0.3–1.2)
BUN: 31 mg/dL — AB (ref 6–23)
CHLORIDE: 96 meq/L (ref 96–112)
CO2: 25 meq/L (ref 19–32)
CREATININE: 1.56 mg/dL — AB (ref 0.50–1.10)
Calcium: 9.1 mg/dL (ref 8.4–10.5)
GFR, EST AFRICAN AMERICAN: 33 mL/min — AB (ref >90)
GFR, EST NON AFRICAN AMERICAN: 29 mL/min — AB (ref >90)
Glucose, Bld: 167 mg/dL — ABNORMAL HIGH (ref 70–99)
POTASSIUM: 3.5 meq/L — AB (ref 3.7–5.3)
Sodium: 139 mEq/L (ref 137–147)
Total Protein: 7.2 g/dL (ref 6.0–8.3)

## 2013-08-14 LAB — PROTIME-INR
INR: 1.05 (ref 0.00–1.49)
PROTHROMBIN TIME: 13.5 s (ref 11.6–15.2)

## 2013-08-14 SURGERY — ERCP, WITH INTERVENTION IF INDICATED
Anesthesia: General

## 2013-08-14 MED ORDER — NEOSTIGMINE METHYLSULFATE 1 MG/ML IJ SOLN
INTRAMUSCULAR | Status: DC | PRN
  Administered 2013-08-14: 4 mg via INTRAVENOUS
  Administered 2013-08-14: 1 mg via INTRAVENOUS

## 2013-08-14 MED ORDER — ROCURONIUM BROMIDE 100 MG/10ML IV SOLN
INTRAVENOUS | Status: DC | PRN
  Administered 2013-08-14: 40 mg via INTRAVENOUS

## 2013-08-14 MED ORDER — LIDOCAINE HCL (CARDIAC) 20 MG/ML IV SOLN
INTRAVENOUS | Status: DC | PRN
  Administered 2013-08-14: 50 mg via INTRAVENOUS

## 2013-08-14 MED ORDER — MIDAZOLAM HCL 5 MG/5ML IJ SOLN
INTRAMUSCULAR | Status: DC | PRN
  Administered 2013-08-14: 1 mg via INTRAVENOUS

## 2013-08-14 MED ORDER — DILTIAZEM HCL 25 MG/5ML IV SOLN
20.0000 mg | Freq: Once | INTRAVENOUS | Status: AC
  Administered 2013-08-14: 20 mg via INTRAVENOUS
  Filled 2013-08-14: qty 5

## 2013-08-14 MED ORDER — LIDOCAINE HCL (CARDIAC) 20 MG/ML IV SOLN
INTRAVENOUS | Status: AC
Start: 2013-08-14 — End: 2013-08-14
  Filled 2013-08-14: qty 5

## 2013-08-14 MED ORDER — MENTHOL 3 MG MT LOZG
1.0000 | LOZENGE | OROMUCOSAL | Status: DC | PRN
  Filled 2013-08-14: qty 9

## 2013-08-14 MED ORDER — ONDANSETRON HCL 4 MG/2ML IJ SOLN
INTRAMUSCULAR | Status: AC
  Filled 2013-08-14: qty 2

## 2013-08-14 MED ORDER — ROCURONIUM BROMIDE 100 MG/10ML IV SOLN
INTRAVENOUS | Status: AC
  Filled 2013-08-14: qty 1

## 2013-08-14 MED ORDER — FENTANYL CITRATE 0.05 MG/ML IJ SOLN
INTRAMUSCULAR | Status: AC
  Filled 2013-08-14: qty 2

## 2013-08-14 MED ORDER — FENTANYL CITRATE 0.05 MG/ML IJ SOLN
INTRAMUSCULAR | Status: DC | PRN
  Administered 2013-08-14: 50 ug via INTRAVENOUS
  Administered 2013-08-14: 25 ug via INTRAVENOUS
  Administered 2013-08-14: .12 ug via INTRAVENOUS

## 2013-08-14 MED ORDER — INSULIN DETEMIR 100 UNIT/ML ~~LOC~~ SOLN
40.0000 [IU] | SUBCUTANEOUS | Status: DC
  Administered 2013-08-14 – 2013-08-15 (×2): 40 [IU] via SUBCUTANEOUS
  Filled 2013-08-14 (×2): qty 0.4

## 2013-08-14 MED ORDER — FUROSEMIDE 10 MG/ML IJ SOLN
INTRAMUSCULAR | Status: AC
  Filled 2013-08-14: qty 2

## 2013-08-14 MED ORDER — PHENYLEPHRINE HCL 10 MG/ML IJ SOLN
INTRAMUSCULAR | Status: DC | PRN
  Administered 2013-08-14: 40 ug via INTRAVENOUS

## 2013-08-14 MED ORDER — ONDANSETRON HCL 4 MG/2ML IJ SOLN
4.0000 mg | Freq: Once | INTRAMUSCULAR | Status: AC
  Administered 2013-08-14: 4 mg via INTRAVENOUS

## 2013-08-14 MED ORDER — ONDANSETRON HCL 4 MG/2ML IJ SOLN
4.0000 mg | Freq: Four times a day (QID) | INTRAMUSCULAR | Status: DC | PRN
  Administered 2013-08-15 (×2): 4 mg via INTRAVENOUS
  Filled 2013-08-14 (×2): qty 2

## 2013-08-14 MED ORDER — GLUCAGON HCL (RDNA) 1 MG IJ SOLR
INTRAMUSCULAR | Status: AC
  Filled 2013-08-14: qty 1

## 2013-08-14 MED ORDER — INSULIN ASPART 100 UNIT/ML ~~LOC~~ SOLN
0.0000 [IU] | Freq: Three times a day (TID) | SUBCUTANEOUS | Status: DC
  Administered 2013-08-15: 5 [IU] via SUBCUTANEOUS
  Administered 2013-08-15: 11 [IU] via SUBCUTANEOUS
  Administered 2013-08-15: 8 [IU] via SUBCUTANEOUS
  Administered 2013-08-16: 11 [IU] via SUBCUTANEOUS
  Administered 2013-08-16: 5 [IU] via SUBCUTANEOUS

## 2013-08-14 MED ORDER — ALBUTEROL SULFATE (2.5 MG/3ML) 0.083% IN NEBU
INHALATION_SOLUTION | RESPIRATORY_TRACT | Status: AC
  Filled 2013-08-14: qty 3

## 2013-08-14 MED ORDER — LACTATED RINGERS IV SOLN: INTRAVENOUS | Status: DC

## 2013-08-14 MED ORDER — INSULIN ASPART 100 UNIT/ML ~~LOC~~ SOLN
3.0000 [IU] | Freq: Three times a day (TID) | SUBCUTANEOUS | Status: DC
  Administered 2013-08-15 (×2): 3 [IU] via SUBCUTANEOUS

## 2013-08-14 MED ORDER — DILTIAZEM HCL 25 MG/5ML IV SOLN
10.0000 mg | Freq: Four times a day (QID) | INTRAVENOUS | Status: DC | PRN
  Filled 2013-08-14: qty 5

## 2013-08-14 MED ORDER — FUROSEMIDE 10 MG/ML IJ SOLN
40.0000 mg | Freq: Once | INTRAMUSCULAR | Status: AC
  Administered 2013-08-14: 40 mg via INTRAVENOUS
  Filled 2013-08-14: qty 4

## 2013-08-14 MED ORDER — PROPOFOL 10 MG/ML IV BOLUS
INTRAVENOUS | Status: DC | PRN
  Administered 2013-08-14: 120 mg via INTRAVENOUS

## 2013-08-14 MED ORDER — DILTIAZEM HCL 25 MG/5ML IV SOLN
INTRAVENOUS | Status: AC
  Filled 2013-08-14: qty 5

## 2013-08-14 MED ORDER — GLYCOPYRROLATE 0.2 MG/ML IJ SOLN
INTRAMUSCULAR | Status: DC | PRN
  Administered 2013-08-14: .6 mg via INTRAVENOUS
  Administered 2013-08-14: .5 mg via INTRAVENOUS

## 2013-08-14 MED ORDER — PROPOFOL 10 MG/ML IV BOLUS
INTRAVENOUS | Status: AC
  Filled 2013-08-14: qty 20

## 2013-08-14 MED ORDER — FENTANYL CITRATE 0.05 MG/ML IJ SOLN: 25.0000 ug | INTRAMUSCULAR | Status: DC | PRN

## 2013-08-14 MED ORDER — GLUCAGON HCL (RDNA) 1 MG IJ SOLR
INTRAMUSCULAR | Status: DC | PRN
  Administered 2013-08-14: 0.25 mg via INTRAVENOUS
  Administered 2013-08-14: .25 mg via INTRAVENOUS

## 2013-08-14 MED ORDER — MIDAZOLAM HCL 2 MG/2ML IJ SOLN
INTRAMUSCULAR | Status: AC
  Filled 2013-08-14: qty 2

## 2013-08-14 NOTE — Progress Notes (Signed)
Pt is alert this morning, appears very restless, during the early morning hours, however pt denies any needs,pt CBG  Was 149 once and 146 twice this AM , still on the the glucose stabilizer per protocol, oncoming nurse made aware of all the AM readings.

## 2013-08-14 NOTE — Progress Notes (Signed)
[name] Estabrook MD  

## 2013-08-14 NOTE — Progress Notes (Signed)
PHARMACY BRIEF NOTE - Anticoagulation  Consult for:  Coumadin Indication:  Atrial fibrillation  Discussed restarting Coumadin with Dr. Clementeen Graham, and received an order to begin Coumadin tonight if [name] is able to take oral medications following the ERCP.    I talked with her nurse and learned that Mrs. Tonche is refusing all oral medications tonight.   Plan:  Continue to monitor the PT/INR daily.  Begin Coumadin on 1/15 if possible.  [place] 08/14/2013 8:46 PM

## 2013-08-14 NOTE — Progress Notes (Signed)
I:n PACU patient appeared weak and was not moving much air ( sat 92% ).  Started bag mask ventilation assist until could start BiPAP.  After 15 minutes BIPAP PCO2 still 73.  Sat. 100%.  Gave 20 mg diltiazem for HR 130-140.  HR came down to 80.  Will continue BiPAP until muscle relaxant wears off more.

## 2013-08-14 NOTE — Interval H&P Note (Signed)
History and Physical Interval Note:  08/14/2013 11:37 AM  [name]  has presented today for surgery, with the diagnosis of biliary obstruction  The various methods of treatment have been discussed with the patient and family. After consideration of risks, benefits and other options for treatment, the patient has consented to  Procedure(s): ENDOSCOPIC RETROGRADE CHOLANGIOPANCREATOGRAPHY (ERCP) (N/A) as a surgical intervention .  The patient's history has been reviewed, patient examined, no change in status, stable for surgery.  I have reviewed the patient's chart and labs.  Questions were answered to the patient's satisfaction.     Pricilla Riffle. Fuller Plan MD

## 2013-08-14 NOTE — Transfer of Care (Signed)
Immediate Anesthesia Transfer of Care Note  Patient: [name]  Procedure(s) Performed: Procedure(s): ENDOSCOPIC RETROGRADE CHOLANGIOPANCREATOGRAPHY (ERCP) (N/A)  Patient Location: PACU  Anesthesia Type:General  Level of Consciousness: sedated and responds to stimulation  Airway & Oxygen Therapy: non-rebreather face mask  Post-op Assessment: Report given to PACU RN  Post vital signs: Reviewed  Complications: No apparent anesthesia complications

## 2013-08-14 NOTE — Anesthesia Postprocedure Evaluation (Signed)
  Anesthesia Post-op Note  Patient: [name]  Procedure(s) Performed: Procedure(s) (LRB): ENDOSCOPIC RETROGRADE CHOLANGIOPANCREATOGRAPHY (ERCP) (N/A)  Patient Location: PACU  Anesthesia Type: General  Level of Consciousness: awake and alert   Airway and Oxygen Therapy: Patient Spontanous Breathing  Post-op Pain: mild  Post-op Assessment: Post-op Vital signs reviewed, Patient's Cardiovascular Status Stable, Respiratory Function Stable, Patent Airway and No signs of Nausea or vomiting  Last Vitals:  Filed Vitals:   08/14/13 1530  BP: 149/77  Pulse: 85  Temp:   Resp: 16    Post-op Vital Signs: stable   Complications: patient had inadequate reversal of muscle relaxant with hypoventilation which resolved with BIPAP.  Will send to step down ICU for observation and heart rate control, since gave diltiazem in PACU.

## 2013-08-14 NOTE — H&P (View-Only) (Signed)
Referring Provider: No ref. provider found Primary Care Physician:  Loura Pardon, MD Primary Gastroenterologist:  Dr. Fuller Plan  Reason for Consultation:  Biliary obstruction  HPI: [name] is a 80 y.o. female with PMH of atrial fibrillation (on Coumadin), HLD, HTN, hypothyroid, irritable bowel syndrome, asthma, CHF, chronic renal insufficiency, diabetes type 2 (uncontrolled), Hx CVA, and iron deficiency anemia. She was admitted on 1/6 due to Atrial fibrillation and supratherapeutic INR of 15.4.  It was also noted that her LFT's were elevated as well and they have continued to increase during her stay.  Today total bili was 12.4, ALP 372, AST 70, and ALT 46.  Viral hepatitis panel was negative.  MRI abdomen/MRCP was ordered and apparently shows biliary obstruction, but final results have not been reported.  Ultrasound on 1/9 showed intrahepatic and common bile duct dilatation.  Could not rule out distal common duct stone or mass.  INR is still 5.87 today.  She is not febrile and does not have a leukocytosis.  No abdominal pain. She is not on antibiotics.  No nausea or vomiting.  She is s/p cholecystectomy.   Past Medical History  Diagnosis Date  . Atrial fibrillation   . GERD (gastroesophageal reflux disease)   . Hyperlipidemia   . Hypertension   . Hypothyroid   . Obesity   . Vaginal dysplasia 1980  . Rosacea   . Chest pain 11/2002    cardiac cath neg   . Back pain   . IBS (irritable bowel syndrome)     with diarrhea  . Asthma     as a child  . Adenomatous colon polyp 12/2007  . Hemorrhoids   . Vertigo   . Hiatal hernia 12/2007    EGD  . CHF (congestive heart failure)   . Complication of anesthesia     "dr said I should never be put to sleep again" (03/07/2013)  . Exertional shortness of breath   . Type II diabetes mellitus   . Iron deficiency anemia   . Arthritis     "all over my body" (03/07/2013)  . Vaginal cancer   . History of cerebrovascular disease 04/24/2013  .  Abnormality of gait 04/24/2013    Past Surgical History  Procedure Laterality Date  . Cholecystectomy  2006  . Joint replacement      Rt TKR [place] 2001  . Cervical cone biopsy  1963    D&C  . Cystocele repair  1978    /rectocele  . Orif ankle fracture Right 2006  . Total knee arthroplasty Right 1993  . Total knee arthroplasty Left 2001  . Cataract extraction w/ intraocular lens  implant, bilateral Bilateral 2006  . Ankle fracture surgery Right 2011    fall/followed by rehab stay; "hand to have 9 pins put in it" (03/07/2013)  . Cardiac catheterization  11/2002    Archie Endo 12/11/2002 (03/07/2013)  . Appendectomy  1941    Archie Endo 11/17/1999 (03/07/2013)  . Abdominal hysterectomy  1963    partial/notes 11/17/1999 (03/07/2013    Prior to Admission medications   Medication Sig Start Date End Date Taking? Authorizing Provider  acetaminophen (TYLENOL) 500 MG tablet Take 500 mg by mouth every 6 (six) hours as needed for mild pain.    Yes Historical Provider, MD  BD PEN NEEDLE NANO U/F 32G X 4 MM MISC USE AS DIRECTED WITH INSULIN PEN 01/16/13  Yes Abner Greenspan, MD  cefUROXime (CEFTIN) 250 MG tablet Take 1 tablet (250 mg  total) by mouth 2 (two) times daily with a meal. 07/28/13  Yes Theodis Blaze, MD  fluocinonide cream (LIDEX) 6.21 % Apply 1 application topically 2 (two) times daily as needed (For sores on body.).    Yes Historical Provider, MD  furosemide (LASIX) 40 MG tablet Take 20 mg by mouth every morning.   Yes Historical Provider, MD  glucose blood (ONE TOUCH ULTRA TEST) test strip Check sugars 2x a day 02/14/13  Yes Philemon Kingdom, MD  hydrocortisone cream 1 % Apply 1 application topically 2 (two) times daily as needed for itching.   Yes Historical Provider, MD  hydrOXYzine (ATARAX/VISTARIL) 10 MG tablet Take 10-30 mg by mouth at bedtime as needed for itching.   Yes Historical Provider, MD  Insulin Isophane & Regular (HUMULIN 70/30 [place]) Inject 42-50 Units into the skin 2 (two) times daily  before a meal. She takes 50 units before breakfast and 42 units before dinner.   Yes Historical Provider, MD  Insulin Syringe-Needle U-100 (BD INSULIN SYRINGE ULTRAFINE) 31G X 15/64" 1 ML MISC 1 each by Does not apply route every morning. Use 2 to 3 times daily as directed. 05/22/13  Yes Philemon Kingdom, MD  levothyroxine (SYNTHROID, LEVOTHROID) 150 MCG tablet Take 150 mcg by mouth daily before breakfast.   Yes Historical Provider, MD  loperamide (ANTI-DIARRHEAL) 2 MG capsule Take 2-4 mg by mouth 4 (four) times daily as needed for diarrhea or loose stools.   Yes Historical Provider, MD  methotrexate 2.5 MG tablet Take 15 mg by mouth every Friday.    Yes Historical Provider, MD  metoprolol (LOPRESSOR) 50 MG tablet Take 25 mg by mouth 2 (two) times daily.   Yes Historical Provider, MD  omeprazole (PRILOSEC) 20 MG capsule Take 20 mg by mouth at bedtime.   Yes Historical Provider, MD  potassium chloride SA (K-DUR,KLOR-CON) 20 MEQ tablet Take 20 mEq by mouth every morning.   Yes Historical Provider, MD  silver sulfADIAZINE (SILVADENE) 1 % cream Apply 1 application topically daily as needed (Applies to body.).    Yes Historical Provider, MD  simvastatin (ZOCOR) 40 MG tablet Take 1 tablet (40 mg total) by mouth at bedtime. 11/15/12 11/15/13 Yes Abner Greenspan, MD  traMADol (ULTRAM) 50 MG tablet Take 50-100 mg by mouth 3 (three) times daily as needed for severe pain.    Yes Historical Provider, MD  warfarin (COUMADIN) 5 MG tablet Take 2.5-5 mg by mouth at bedtime. She takes one tablet on Monday and half a tablet the rest of the week. 02/19/13  Yes Abner Greenspan, MD    Current Facility-Administered Medications  Medication Dose Route Frequency Provider Last Rate Last Dose  . 0.9 %  sodium chloride infusion   Intravenous Continuous Allie Bossier, MD 100 mL/hr at 08/09/13 0800    . diphenhydrAMINE (BENADRYL) capsule 25 mg  25 mg Oral Q6H PRN Allie Bossier, MD       Or  . diphenhydrAMINE (BENADRYL) injection 25  mg  25 mg Intramuscular Q6H PRN Allie Bossier, MD      . diphenhydrAMINE (BENADRYL) injection 25 mg  25 mg Intravenous Q6H PRN Donne Hazel, MD      . fluocinonide cream (LIDEX) 3.08 % 1 application  1 application Topical BID PRN Allie Bossier, MD      . furosemide (LASIX) injection 60 mg  60 mg Intravenous Daily Donne Hazel, MD   60 mg at 08/10/13 0918  . hydrocortisone cream 1 %  1 application  1 application Topical BID PRN Allie Bossier, MD      . insulin aspart (novoLOG) injection 0-15 Units  0-15 Units Subcutaneous Q4H Allie Bossier, MD   5 Units at 08/10/13 0845  . ipratropium (ATROVENT) nebulizer solution 0.5 mg  0.5 mg Nebulization Q4H PRN Ritta Slot, NP   0.5 mg at 08/10/13 0149  . levalbuterol (XOPENEX) nebulizer solution 0.63 mg  0.63 mg Nebulization Q6H PRN Ritta Slot, NP   0.63 mg at 08/10/13 0149  . levothyroxine (SYNTHROID, LEVOTHROID) tablet 150 mcg  150 mcg Oral QAC breakfast Allie Bossier, MD   150 mcg at 08/10/13 0845  . loperamide (IMODIUM) capsule 2-4 mg  2-4 mg Oral QID PRN Allie Bossier, MD      . LORazepam (ATIVAN) injection 1 mg  1 mg Intravenous PRN Donne Hazel, MD   1 mg at 08/09/13 2134  . metoprolol tartrate (LOPRESSOR) tablet 25 mg  25 mg Oral BID Allie Bossier, MD   25 mg at 08/10/13 [id number]  . oxyCODONE (Oxy IR/ROXICODONE) immediate release tablet 5 mg  5 mg Oral Q4H PRN Allie Bossier, MD      . pantoprazole (PROTONIX) EC tablet 40 mg  40 mg Oral Daily Allie Bossier, MD   40 mg at 08/10/13 [id number]  . potassium chloride (K-DUR,KLOR-CON) CR tablet 30 mEq  30 mEq Oral Q4H Ritta Slot, NP   30 mEq at 08/10/13 0845  . potassium chloride SA (K-DUR,KLOR-CON) CR tablet 20 mEq  20 mEq Oral q morning - 10a Allie Bossier, MD   20 mEq at 08/10/13 [phone]  . silver sulfADIAZINE (SILVADENE) 1 % cream 1 application  1 application Topical Daily PRN Allie Bossier, MD      . traMADol Veatrice Bourbon) tablet 50-100 mg  50-100 mg Oral TID PRN Allie Bossier, MD      . Warfarin -  Pharmacist Dosing Inpatient   Does not apply [id number] Donne Hazel, MD        Allergies as of 08/06/2013 - Review Complete 08/06/2013  Allergen Reaction Noted  . Ciprofloxacin  08/15/2012  . Gabapentin Nausea And Vomiting 02/04/2013  . Insulin glargine  06/06/2011  . Latex  06/06/2011  . Metformin and related  06/06/2011  . Metoclopramide hcl Nausea And Vomiting 09/27/2006  . Nsaids Nausea And Vomiting 09/27/2006  . Promethazine hcl Other (See Comments) 09/27/2006    Family History  Problem Relation Age of Onset  . Cancer Mother     leukemia  . Coronary artery disease Mother   . Dementia Mother   . Heart disease Father     CAD  . COPD Sister   . Heart disease Brother   . Diabetes Brother   . Kidney disease Son   . Colon cancer Brother     History   Social History  . Marital Status: Married    Spouse Name: N/A    Number of Children: 3  . Years of Education: HS   Occupational History  . Retired     Marketing executive Work   . Retired - office work    Social History Main Topics  . Smoking status: Former Smoker -- 1.00 packs/day for 40 years    Types: Cigarettes  . Smokeless tobacco: Never Used     Comment: 03/07/2013 "quit smoking 35 years ago"  . Alcohol Use: No  . Drug Use: No  . Sexual Activity: Not Currently  Other Topics Concern  . Not on file   Social History Narrative  . No narrative on file    Review of Systems: Ten point ROS is O/W negative except as mentioned in HPI.  Physical Exam: Vital signs in last 24 hours: Temp:  [98.1 F (36.7 C)-98.3 F (36.8 C)] 98.3 F (36.8 C) (01/09 2100) Pulse Rate:  [79] 79 (01/09 2100) Resp:  [18-20] 20 (01/09 2100) BP: (126-152)/(57-84) 152/84 mmHg (01/10 0918) SpO2:  [94 %-99 %] 94 % (01/10 0149) Last BM Date: 08/07/13 General:  Alert, Well-developed, well-nourished, pleasant and cooperative in NAD Head:  Normocephalic and atraumatic. Eyes:  Scleral icterus present. Ears:  Normal auditory acuity. Mouth:  No  deformity or lesions.   Lungs:  Wheezing heard B/L. Heart:  Irregular.  No murmurs noted. Abdomen:  Soft, non-distended.  BS present.  Non-tender.   Rectal:  Deferred  Msk:  Symmetrical without gross deformities. Pulses:  Normal pulses noted. Extremities:  Without clubbing or edema. Neurologic:  Alert and  oriented x4;  grossly normal neurologically. Skin:  Intact without significant lesions or rashes.  Jaundiced.   Psych:  Alert and cooperative. Normal mood and affect.  Intake/Output from previous day: 01/09 0701 - 01/10 0700 In: 500 [P.O.:100; I.V.:400] Out: -   Lab Results:  Recent Labs  08/08/13 0605 08/09/13 0530 08/10/13 0603  WBC 6.7 6.3 5.9  HGB 10.7* 10.6* 11.7*  HCT 33.7* 32.1* 35.0*  PLT 334 318 306   BMET  Recent Labs  08/08/13 0605 08/09/13 0530 08/10/13 0603  NA 141 138 139  K 3.3* 3.4* 2.8*  CL 105 102 97  CO2 22 22 25   GLUCOSE 200* 178* 256*  BUN 20 18 17   CREATININE 1.25* 1.22* 1.31*  CALCIUM 7.9* 7.8* 7.7*   LFT  Recent Labs  08/10/13 0603  PROT 7.1  ALBUMIN 2.7*  AST 70*  ALT 46*  ALKPHOS 372*  BILITOT 12.4*   PT/INR  Recent Labs  08/09/13 0530 08/10/13 0603  LABPROT 54.0* 50.2*  INR 6.46* 5.87*   Hepatitis Panel  Recent Labs  08/09/13 0909  HEPBSAG NEGATIVE  HCVAB NEGATIVE  HEPAIGM NON REACTIVE  HEPBIGM NON REACTIVE    Studies/Results: US Abdomen Limited  08/09/2013   CLINICAL DATA:  Elevated LFTs.  EXAM: US ABDOMEN LIMITED - RIGHT UPPER QUADRANT  COMPARISON:  None.  FINDINGS: Gallbladder  Previous cholecystectomy.  Common bile duct  Diameter: 18.4 mm.  Liver:  No focal lesion identified.  Intrahepatic ductal dilatation noted.  IMPRESSION: 1. Intrahepatic and common bile duct dilatation. Cannot rule out distal common bile duct stone or mass. Consider further evaluation with MRCP.   Electronically Signed   By: Kerby Moors M.D.   On: 08/09/2013 10:49   Dg Chest Port 1 View  08/09/2013   CLINICAL DATA:  Shortness of  breath  EXAM: PORTABLE CHEST - 1 VIEW  COMPARISON:  03/07/2013  FINDINGS: Low lung volumes. Cardiac silhouette is enlarged. There is prominence of the interstitial markings, there is prominence of the interstitial markings and mild peribronchial cuffing. No focal regions of consolidation or focal infiltrates identified. Degenerative changes appreciated within the right and left shoulders.  IMPRESSION: Pulmonary vascular congestion/mild edema. No focal regions of consolidation or focal infiltrates. The findings within the lung parenchyma are accentuated by the low lung volumes.   Electronically Signed   By: Margaree Mackintosh M.D.   On: 08/09/2013 15:55    IMPRESSION:  -Painless obstructive jaundice:  Awaiting MRI/MRCP results to  show source of obstruction.  She is afebrile and does not have a leukocytosis.   -Supratherapeutic INR:  INR 5.87 today. -Atrial fibrillation:  On coumadin. -Asthma -CHF -DM type 2 uncontrolled  PLAN: -Will need ERCP for biliary decompression.  ? Timing.  INR needs to be 1.5 or less.   -? If we should place her on antibiotics now or just give her a dose on-call prior to ERCP.   ZEHR, JESSICA D.  08/10/2013, 11:04 AM  Pager number [phone]   ________________________________________________________________________  Velora Heckler GI MD note:  I personally examined the patient, reviewed the data and agree with the assessment and plan described above. She is morbidly obese, pretty frail appearing overall.  Obstructive jaundice.  Normally MRCP is always done with MRI abdomen as well however she was not able to tolerate the full exam and so we do not have parenchymal evaluation of the pancreas. MRCP images do suggest mass in head of pancreas.  More cross section imaging will be helpful to best characterize mass, involvement of significant vessels.  I will order CT scan IV and po contrast, pancreatic protocol. She will need ERCP when coags allow. No sign of cholangitis clinically, will  hold off on abx until procedures.    Owens Loffler, MD [facility] Gastroenterology Pager [phone]

## 2013-08-14 NOTE — Op Note (Signed)
[facility] [place], [id number]   ERCP PROCEDURE REPORT  PATIENT: [name], [name].  MR# :[id number] BIRTHDATE: [birth year]  GENDER: Female ENDOSCOPIST: Ladene Artist, MD, [facility] REFERRED BY: [facility] PROCEDURE DATE:  08/14/2013 PROCEDURE:   ERCP with balloon dilation and ERCP with stent placement and brush cytology ASA CLASS:   Class III INDICATIONS:biliary dilation on Computed Tomogram Scan.   malignant tumor of the head of pancreas.   abnormal liver function test. MEDICATIONS: General endotracheal anesthesia (GETA) TOPICAL ANESTHETIC: none DESCRIPTION OF PROCEDURE:   After the risks benefits and alternatives of the procedure were thoroughly explained, informed consent was obtained.  The Pentax Ercp Scope [id number]  endoscope was introduced through the mouth  and advanced to the second portion of the duodenum . 1.  There was a medium sized periampullary diverticulum. 2.  A tight, malignant appearing stricture was noted in the distal common bile duct.  It was brushed for cytology. 3.  Under endoscopic and fluoroscopic guidance a 10 mm x 6 cm uncovered metal stent was placed in the bile duct. 4.  Balloon dilation with a through-the-scope dilator was performed as the CBD stricture remained tight. A small amount of bleeding was noted following the dilation. The stricture was adequately opened following the balloon dilation. Adequate biliary drainage noted. 5.  There was dilation of the proximal common bile duct and intrahepatic ducts. The PD was not cannulated or injected by intention. The scope was then completely withdrawn from the patient and the procedure completed.   COMPLICATIONS: .  There were no complications.  ENDOSCOPIC IMPRESSION: 1.   Periampullary diverticulum 2.   Tight, malignant appearing stricture in the distal common bile duct; brushed; 10 mm x 6 cm uncovered metal stent placed 3.   Balloon dilation with a  through-the-scope dilator was performed 4.   Dilated proximal common bile duct and intrahepatic ducts  RECOMMENDATIONS: 1.  Monitor LFTs  eSigned:  Ladene Artist, MD, [facility] 08/14/2013 2:11 PM

## 2013-08-14 NOTE — Progress Notes (Signed)
Patient ID: [name], female   DOB: January 22, 1923, 80 y.o.   MRN: [id number]   Pt developed rapid afib and hypoxia post ERCP,transferred to PACU, and now just arrived in ICU. WAs on BIPAP-off past 20 minutes and sats in 90's HR 80-90 post Cardizem CXR -bilateral infiltrates vs edema/CHF Dr Clementeen Graham aware and managing medical issues, has received Lasix and Solumedrol. Sleepy ,trying to talk-no c/o abd pain  Management per medicine team

## 2013-08-14 NOTE — Progress Notes (Signed)
Dr Clementeen Graham notified pf pt requiring Bi-pap and cardizem after ERCP,and anesthesia ordered transfer to ICU.  MD aware and will write orders.

## 2013-08-14 NOTE — Progress Notes (Signed)
[facility] PROGRESS NOTE  [name] [id number] DOB: 1923-02-18 DOA: 08/06/2013 PCP: Loura Pardon, MD   Brief narrative 80 yo with a hx of afib on coumadin who initially presented with coumadin coagulopathy. Work up revealed elevated LFT's Further work up demonstrated a pancreatic head mass. GI was consulted with plans for ERCP. Unfortunately, the patient was noted to have wheezing and cxr findings of pulm edema. Steroids were started which resulted in markedly elevated glucose, now on insulin gtt. Lasix was stopped on 08/13/13 secondary to receiving IVF per insulin gtt protcol. ERCP now delayed tentatively for 08/14/13. INR has since been reversed with vitamin K and multiple units of FFP per GI.  Assessment/Plan:  A-Fib on chronic anticoagulation with supratherapeutic INR - Presented with INR of over 15 s/p 2.5 mg vitamin K,  now trending down off coumadin  - No signs of active bleeding  - cont to hold coumadin  - Pharmacy consulted  -will resume coumadin once able to take po   Afib with RVR  patient went in to rapid afib in PACU on 1/14 Given 20 mg IV Cardizem push and HR improved to <100. Monitor on tele. Prn IV cardizem for now.   Acute pulmonary edema Patient  became acutely hypoxic post ERCP today with ABG showing hapercarbia Placed on BiPAP and now transitioned to facemask. ordered stat CXR and 40 mg IV lasix. No wheezing on exam. Patient recently had developed acute pulm edema with wheezing requiring lasix and  IV solumedrol  HTN  -Stable  -Cont current regimen  -Patient not on ACE/ARB secondary to poor renal function   Hyperlipidemia -d/c'd statin secondary to elevated LFT's   Diabetes type 2 with peripheral neuropathy  -hemoglobin A1c of 8.3  -Glucose markedly elevated after starting solumedrol for wheezing on exam  - Now on insulin gtt and stil has AG of 17 this am. Repeat BMET -Patient getting IVF per DKA protocol. Will hold fluids and she may be  volume overloaded. Monitor strict I/O .     Bullous pemphigoid  -stable   Renal insufficiency  -Avoid all nephrotoxic medication   Elevated LFT's  - Liver US on 08/09/13 demonstrated intrahepatic and common bile ductdilitation, cannot rule out CBD stone or mass  - MRCP was ordered, however per staff, pt was apparently unable to tolerate procedure  - Appreciate GI input . ERCP done today. Will follow with results - Cont to avoid hepatotoxic drugs   Mass at Pancreatic Head  - noted on 08/10/13 CT abd - worrisome for adenocarcinoma  - Ca19-9 elevated at 2,758  - GI following  -ERCP today. - Patient and son aware of CT findings of pancreatic head mass   Hypokalemia  - Likely secondary to diuretics that were recently increased  - replenished   Code Status: Full  Family Communication: discussed with sons at bedside Disposition Plan: currently inpatient   HPI/Subjective:  Patient seen this am and later during the day at PACU. Developed acute SOB post procedure. Placed on BiPAP for acute hypercarbic resp failure. Stable for transition to facemask and transferred to ICU.   Objective: Filed Vitals:   08/14/13 1530  BP: 149/77  Pulse: 85  Temp:   Resp: 16    Intake/Output Summary (Last 24 hours) at 08/14/13 1623 Last data filed at 08/14/13 1513  Gross per 24 hour  Intake   1000 ml  Output   2010 ml  Net  -1010 ml   Filed Weights   08/06/13 1720  Weight: 110.904 kg (244 lb 8 oz)    Exam:   General:  Elderly female on facemask. Appears restless  HEENT: no pallor, icterus, moist mucosa  Chest: diminished breath sounds at lung bases, no added sounds  Cardiovascular: S1&S2 irregularly irregular  Abd: soft, NT, ND, BS+foley in place  EXT: warm, no edema, jaundiced  CNS: AAOX2, hard of hearing  Data Reviewed: Basic Metabolic Panel:  Recent Labs Lab 08/11/13 0616 08/12/13 0525 08/12/13 2207 08/13/13 0315 08/13/13 0755 08/13/13 1619 08/14/13 0525  08/14/13 1531  NA 138 138 128* 131* 136* 134* 139 141  K 3.2* 3.0* 4.2 3.5* 3.1* 3.5* 3.5* 3.6*  CL 98 93* 84* 87* 91* 92* 96  --   CO2 26 29 23 22 23 24 25   --   GLUCOSE 234* 170* 693* 638* 429* 181* 167*  --   BUN 18 21 30* 32* 33* 32* 31*  --   CREATININE 1.34* 1.43* 1.63* 1.60* 1.61* 1.57* 1.56*  --   CALCIUM 7.7* 8.2* 8.1* 7.8* 8.4 8.7 9.1  --   MG 1.3* 1.2*  --  1.4*  --  2.6* 2.4  --    Liver Function Tests:  Recent Labs Lab 08/10/13 0603 08/11/13 0616 08/12/13 0525 08/13/13 0315 08/14/13 0525  AST 70* 71* 71* 66* 103*  ALT 46* 44* 44* 41* 52*  ALKPHOS 372* 356* 375* 340* 347*  BILITOT 12.4* 13.2* 14.2* 16.7* 19.0*  PROT 7.1 6.9 7.3 7.0 7.2  ALBUMIN 2.7* 2.5* 2.6* 2.6* 2.6*   No results found for this basename: LIPASE, AMYLASE,  in the last 168 hours  Recent Labs Lab 08/10/13 0843  AMMONIA RESULTS UNAVAILABLE DUE TO INTERFERING SUBSTANCE   CBC:  Recent Labs Lab 08/10/13 0603 08/11/13 0616 08/12/13 0525 08/13/13 0315 08/14/13 0525 08/14/13 1531  WBC 5.9 7.1 8.2 7.6 19.3*  --   NEUTROABS 3.3 4.1 4.7 7.0 17.3*  --   HGB 11.7* 11.6* 12.7 11.3* 11.7* 13.9  HCT 35.0* 34.3* 37.8 32.6* 34.9* 41.0  MCV 91.9 88.9 90.4 88.8 90.2  --   PLT 306 340 357 350 353  --    Cardiac Enzymes: No results found for this basename: CKTOTAL, CKMB, CKMBINDEX, TROPONINI,  in the last 168 hours BNP (last 3 results)  Recent Labs  03/07/13 1820  PROBNP 7836.0*   CBG:  Recent Labs Lab 08/14/13 1153 08/14/13 1257 08/14/13 1357 08/14/13 1450 08/14/13 1600  GLUCAP 133* 126* 173* 208* 142*    No results found for this or any previous visit (from the past 240 hour(s)).   Studies: No results found.  Scheduled Meds: . diltiazem      . furosemide      . furosemide  40 mg Intravenous Once  . ipratropium-albuterol  3 mL Nebulization Q4H  . levothyroxine  150 mcg Oral QAC breakfast  . metoprolol  25 mg Oral BID  . ondansetron      . ondansetron      . pantoprazole   40 mg Oral Daily  . potassium chloride SA  40 mEq Oral q morning - 10a  . predniSONE  40 mg Oral Q breakfast  . Warfarin - Pharmacist Dosing Inpatient   Does not apply q1800   Continuous Infusions: . sodium chloride 20 mL/hr at 08/11/13 1935  . sodium chloride 125 mL/hr at 08/13/13 0802  . dextrose 5 % and 0.45% NaCl 75 mL/hr at 08/14/13 0506  . insulin (NOVOLIN-R) infusion 12.3 Units/hr (08/14/13 1602)  . lactated ringers  Time spent: 35 minutes    Meara Wiechman  [facility] Pager [phone]. If 7PM-7AM, please contact night-coverage at [email or web address], password [facility] 08/14/2013, 4:23 PM  LOS: 8 days

## 2013-08-14 NOTE — Progress Notes (Signed)
Patient ID: [name], female   DOB: 02-02-1923, 80 y.o.   MRN: [id number]    Progress Note   Subjective  Feeling better today, itchy-no c/o pain. Sons at bedside. Pt had a couple reuns of VT yesterday morning-none since K+ and MG replaced. Remains on insulin drip.   Objective   Vital signs in last 24 hours: Temp:  [97.2 F (36.2 C)-97.7 F (36.5 C)] 97.2 F (36.2 C) (01/14 0604) Pulse Rate:  [87-101] 101 (01/14 0604) Resp:  [20] 20 (01/14 0604) BP: (118-131)/(68-78) 126/71 mmHg (01/14 0604) SpO2:  [93 %-98 %] 96 % (01/14 0820) Last BM Date: 08/11/13 General: Elderly    white female in NAD. jaundiced Heart: ir Regular rate and rhythm; no murmurs Lungs: Respirations even and unlabored, exp wheeze Abdomen:large   Soft, nontender and nondistended. Normal bowel sounds. Extremities:  Without edema. Neurologic:  Alert and oriented,  grossly normal neurologically.HOH Psych:  Cooperative. Normal mood and affect.  Intake/Output from previous day: 01/13 0701 - 01/14 0700 In: 0  Out: 2850 [Urine:2850] Intake/Output this shift:    Lab Results:  Recent Labs  08/12/13 0525 08/13/13 0315 08/14/13 0525  WBC 8.2 7.6 19.3*  HGB 12.7 11.3* 11.7*  HCT 37.8 32.6* 34.9*  PLT 357 350 353   BMET  Recent Labs  08/13/13 0755 08/13/13 1619 08/14/13 0525  NA 136* 134* 139  K 3.1* 3.5* 3.5*  CL 91* 92* 96  CO2 23 24 25   GLUCOSE 429* 181* 167*  BUN 33* 32* 31*  CREATININE 1.61* 1.57* 1.56*  CALCIUM 8.4 8.7 9.1   LFT  Recent Labs  08/14/13 0525  PROT 7.2  ALBUMIN 2.6*  AST 103*  ALT 52*  ALKPHOS 347*  BILITOT 19.0*   PT/INR  Recent Labs  08/13/13 0315 08/14/13 0525  LABPROT 15.2 13.5  INR 1.23 1.05    Studies/Results: Dg Chest Port 1 View  08/12/2013   CLINICAL DATA:  80 year old female shortness of breath. Initial encounter.  EXAM: PORTABLE CHEST - 1 VIEW  COMPARISON:  DG CHEST 1V PORT dated 08/09/2013; DG CHEST 1V PORT dated 03/07/2013 And earlier.   FINDINGS: Portable AP upright view at 0933 hr. Lung volumes not significantly changed. Stable cardiomegaly and mediastinal contours. No pneumothorax. Stable increased interstitial markings, no overt edema. No pleural effusion or consolidation. No acute pulmonary opacity identified.  IMPRESSION: Stable. No acute cardiopulmonary abnormality.   Electronically Signed   By: Lars Pinks M.D.   On: 08/12/2013 09:42      Assessment / Plan:   #1  80 yo female with pancreatic head mass with biliary obstruction. For ERCP /stent today. Parameters improved. Family aware she is at increased risk for complications due to age and multiple comorbidiites. Also discussed risk of pancreatitis #2 IDDM -currently on glucose stabilizer- osmolar gap not normalized as yet #3 Asthma/ #4 afib #5 coagulopathy- corrected   Active Problems:   HYPERLIPIDEMIA   HYPERTENSION   Atrial fibrillation   Irritable bowel syndrome   Rosacea   DM type 2 with diabetic peripheral neuropathy   Anemia   History of cerebrovascular disease   Supratherapeutic INR   Pulmonary hypertension   Nonspecific (abnormal) findings on radiological and other examination of biliary tract    LOS: 8 days   Amy Esterwood  08/14/2013, 9:27 AM     Attending physician's note   I have taken an interval history, reviewed the chart and examined the patient. I agree with the Advanced Practitioner's note, impression and  recommendations.   Pricilla Riffle. Fuller Plan, MD [facility]

## 2013-08-15 ENCOUNTER — Inpatient Hospital Stay (HOSPITAL_COMMUNITY): Payer: Medicare Other

## 2013-08-15 ENCOUNTER — Encounter (HOSPITAL_COMMUNITY): Payer: Self-pay | Admitting: Gastroenterology

## 2013-08-15 DIAGNOSIS — J9600 Acute respiratory failure, unspecified whether with hypoxia or hypercapnia: Secondary | ICD-10-CM

## 2013-08-15 LAB — GLUCOSE, CAPILLARY
GLUCOSE-CAPILLARY: 277 mg/dL — AB (ref 70–99)
GLUCOSE-CAPILLARY: 292 mg/dL — AB (ref 70–99)
Glucose-Capillary: 156 mg/dL — ABNORMAL HIGH (ref 70–99)
Glucose-Capillary: 228 mg/dL — ABNORMAL HIGH (ref 70–99)
Glucose-Capillary: 335 mg/dL — ABNORMAL HIGH (ref 70–99)

## 2013-08-15 LAB — CBC WITH DIFFERENTIAL/PLATELET
Basophils Absolute: 0 10*3/uL (ref 0.0–0.1)
Basophils Relative: 0 % (ref 0–1)
EOS ABS: 0 10*3/uL (ref 0.0–0.7)
EOS PCT: 0 % (ref 0–5)
HEMATOCRIT: 34.9 % — AB (ref 36.0–46.0)
Hemoglobin: 11.3 g/dL — ABNORMAL LOW (ref 12.0–15.0)
Lymphocytes Relative: 6 % — ABNORMAL LOW (ref 12–46)
Lymphs Abs: 0.8 10*3/uL (ref 0.7–4.0)
MCH: 29.8 pg (ref 26.0–34.0)
MCHC: 32.4 g/dL (ref 30.0–36.0)
MCV: 92.1 fL (ref 78.0–100.0)
MONO ABS: 1.1 10*3/uL — AB (ref 0.1–1.0)
Monocytes Relative: 7 % (ref 3–12)
Neutro Abs: 13.2 10*3/uL — ABNORMAL HIGH (ref 1.7–7.7)
Neutrophils Relative %: 87 % — ABNORMAL HIGH (ref 43–77)
Platelets: 315 10*3/uL (ref 150–400)
RBC: 3.79 MIL/uL — ABNORMAL LOW (ref 3.87–5.11)
RDW: 18.9 % — AB (ref 11.5–15.5)
WBC: 15.1 10*3/uL — AB (ref 4.0–10.5)

## 2013-08-15 LAB — MAGNESIUM: Magnesium: 2 mg/dL (ref 1.5–2.5)

## 2013-08-15 LAB — COMPREHENSIVE METABOLIC PANEL
ALBUMIN: 2.5 g/dL — AB (ref 3.5–5.2)
ALK PHOS: 340 U/L — AB (ref 39–117)
ALT: 57 U/L — AB (ref 0–35)
AST: 97 U/L — ABNORMAL HIGH (ref 0–37)
BUN: 34 mg/dL — ABNORMAL HIGH (ref 6–23)
CALCIUM: 8.8 mg/dL (ref 8.4–10.5)
CO2: 27 mEq/L (ref 19–32)
Chloride: 95 mEq/L — ABNORMAL LOW (ref 96–112)
Creatinine, Ser: 1.24 mg/dL — ABNORMAL HIGH (ref 0.50–1.10)
GFR calc non Af Amer: 38 mL/min — ABNORMAL LOW (ref >90)
GFR, EST AFRICAN AMERICAN: 44 mL/min — AB (ref >90)
GLUCOSE: 168 mg/dL — AB (ref 70–99)
Potassium: 3.9 mEq/L (ref 3.7–5.3)
SODIUM: 137 meq/L (ref 137–147)
TOTAL PROTEIN: 6.9 g/dL (ref 6.0–8.3)
Total Bilirubin: 19.7 mg/dL (ref 0.3–1.2)

## 2013-08-15 LAB — PROTIME-INR
INR: 1.06 (ref 0.00–1.49)
Prothrombin Time: 13.6 seconds (ref 11.6–15.2)

## 2013-08-15 MED ORDER — DILTIAZEM HCL 30 MG PO TABS
30.0000 mg | ORAL_TABLET | Freq: Three times a day (TID) | ORAL | Status: DC
Start: 2013-08-15 — End: 2013-08-16
  Administered 2013-08-15 (×3): 30 mg via ORAL
  Filled 2013-08-15 (×7): qty 1

## 2013-08-15 MED ORDER — WARFARIN SODIUM 1 MG PO TABS
1.0000 mg | ORAL_TABLET | Freq: Once | ORAL | Status: AC
  Administered 2013-08-15: 1 mg via ORAL
  Filled 2013-08-15 (×2): qty 1

## 2013-08-15 MED ORDER — FUROSEMIDE 10 MG/ML IJ SOLN
40.0000 mg | Freq: Once | INTRAMUSCULAR | Status: AC
  Administered 2013-08-15: 40 mg via INTRAVENOUS

## 2013-08-15 NOTE — Progress Notes (Signed)
Inpatient Diabetes Program Recommendations  AACE/ADA: New Consensus Statement on Inpatient Glycemic Control (2013)  Target Ranges:  Prepandial:   less than 140 mg/dL      Peak postprandial:   less than 180 mg/dL (1-2 hours)      Critically ill patients:  140 - 180 mg/dL   Reason for Visit: Hyperglycemia  Pt is on 70/30 50 units in am and 42 units in pm prior to admission which equates to 64 units basal and 28 units bolus. Would recommend reducing this by 20% while in hospital. Consider: Lantus 52 units QHS and Novolog 8 units tidwc. Another option:  If pt is eating >50%, use 70/30 40 units in am and 34 units ac dinner.  Will continue to follow. Thank you. Lorenda Peck, RD, LDN, CDE Inpatient Diabetes Coordinator [phone]

## 2013-08-15 NOTE — Progress Notes (Signed)
Echocardiogram 2D Echocardiogram has been performed.  [name] 08/15/2013, 1:14 PM

## 2013-08-15 NOTE — Progress Notes (Signed)
Patient ID: [name], female   DOB: 1922-11-24, 80 y.o.   MRN: [id number]    Progress Note   Subjective  Talkative and annoyed with herself for being confused and uncooperative... No c/o SOB, no abdominal pain or nausea   Objective   Vital signs in last 24 hours: Temp:  [96.9 F (36.1 C)-98.1 F (36.7 C)] 98.1 F (36.7 C) (01/15 0800) Pulse Rate:  [70-142] 100 (01/15 0900) Resp:  [15-23] 17 (01/15 0900) BP: (110-183)/(46-109) 110/52 mmHg (01/15 0830) SpO2:  [86 %-100 %] 98 % (01/15 0900) FiO2 (%):  [35 %] 35 % (01/14 1932) Weight:  [241 lb 13.5 oz (109.7 kg)] 241 lb 13.5 oz (109.7 kg) (01/15 0400) Last BM Date: 08/11/13 General:  Jaundiced elderly  white female in NAD Heart:  Regular rate and rhythm; no murmurs Lungs: Respirations even and unlabored,decreased BS but no wheezing today Abdomen:  Large,Soft, nontender and nondistended. Normal bowel sounds. Extremities:  Without edema. Neurologic:  Alert and oriented,  grossly normal neurologically.-confused about yesterdays events Psych:  Cooperative. Normal mood and affect.  Intake/Output from previous day: 01/14 0701 - 01/15 0700 In: 2140.3 [P.O.:480; I.V.:1660.3] Out: 3635 [Urine:3625; Blood:10] Intake/Output this shift: Total I/O In: 120 [P.O.:120] Out: 225 [Urine:225]  Lab Results:  Recent Labs  08/13/13 0315 08/14/13 0525 08/14/13 1531 08/15/13 0640  WBC 7.6 19.3*  --  15.1*  HGB 11.3* 11.7* 13.9 11.3*  HCT 32.6* 34.9* 41.0 34.9*  PLT 350 353  --  315   BMET  Recent Labs  08/14/13 0525 08/14/13 1531 08/14/13 1655 08/15/13 0400  NA 139 141 136* 137  K 3.5* 3.6* 3.9 3.9  CL 96  --  99 95*  CO2 25  --  24 27  GLUCOSE 167*  --  128* 168*  BUN 31*  --  32* 34*  CREATININE 1.56*  --  1.25* 1.24*  CALCIUM 9.1  --  8.4 8.8   LFT  Recent Labs  08/15/13 0400  PROT 6.9  ALBUMIN 2.5*  AST 97*  ALT 57*  ALKPHOS 340*  BILITOT 19.7*   PT/INR  Recent Labs  08/14/13 0525 08/15/13 0400    LABPROT 13.5 13.6  INR 1.05 1.06    Studies/Results: Dg Chest Port 1 View  08/15/2013   CLINICAL DATA:  Shortness of breath, CHF, pulmonary edema, history hypertension, atrial fibrillation, diabetes, CHF, asthma, vaginal cancer  EXAM: PORTABLE CHEST - 1 VIEW  COMPARISON:  Portable exam 0908 hr compared to 08/14/2013  FINDINGS: Enlargement of cardiac silhouette.  Calcified tortuous aorta.  Pulmonary vascular congestion.  Improved interstitial infiltrates likely improved pulmonary edema.  Minimal basilar atelectasis bilaterally.  No gross pleural effusion or pneumothorax.  IMPRESSION: Probable CHF, slightly improved.   Electronically Signed   By: Lavonia Dana M.D.   On: 08/15/2013 09:26   Dg Chest Port 1 View  08/14/2013   CLINICAL DATA:  Shortness of breath, history hypertension, diabetes, CHF  EXAM: PORTABLE CHEST - 1 VIEW  COMPARISON:  Portable exam 8469 hr compared to 08/12/2013  FINDINGS: Enlargement of cardiac silhouette with pulmonary vascular congestion.  Calcified tortuous thoracic aorta.  Scattered pulmonary infiltrates likely pulmonary edema and CHF.  No gross pleural effusion or pneumothorax.  IMPRESSION: Probable CHF.   Electronically Signed   By: Lavonia Dana M.D.   On: 08/14/2013 16:37   Dg Ercp  08/15/2013   CLINICAL DATA:  Common bile duct stricture, stent placement  EXAM: ERCP performed by Dr. Lucio Edward  TECHNIQUE:  Multiple spot images obtained with the fluoroscopic device and submitted for interpretation post-procedure.  COMPARISON:  MRCP 08/09/2013; pancreas protocol CT 06/10/2014  FINDINGS: A total of 14 spot images obtained during ERCP and placement of a metallic biliary stent are submitted for radiologic interpretation. The initial images demonstrate an endoscope in the descending duodenum with cannulation of the common bile duct. Retrograde opacification demonstrates intra and extrahepatic biliary ductal dilatation. The distal common bile duct is unremarkable. In the mid common  bile duct there is a focal high-grade stenosis. A wire was advanced beyond the stenosis into the intrahepatic bile ducts. The final images depict deployment of a self expanding covered metallic stent.  IMPRESSION: ERCP and placement of a self expanding metallic biliary stent as above.  These images were submitted for radiologic interpretation only. Please see the procedural report for the amount of contrast and the fluoroscopy time utilized.   Electronically Signed   By: Jacqulynn Cadet M.D.   On: 08/15/2013 08:13       Assessment / Plan:   #1 80 yo female with pancreatic cancer with biliary obstruction- stable post ERCP /stent placement and brushings yesterday. Bili has not dropped as yet -continue to observe Path pending- she will not be a candidate for any aggressive treatment of her pancreatic cancer due to age and multiple comorbidities - will discuss further with pt, and family when path reviewed #3 CHF- exacerbation yesterday with anesthesia- improved today #4 rapid atrial fib- rate controlled  #5DM with ketoacidosis this admit- resolved #6 chronic anticoagulation- reversed for procedures- can resume prior to discharge #7confusion- pt feels steroids make her agitated- defer to medicine   Active Problems:   HYPERLIPIDEMIA   HYPERTENSION   Atrial fibrillation   Irritable bowel syndrome   Rosacea   DM type 2 with diabetic peripheral neuropathy   Anemia   History of cerebrovascular disease   Supratherapeutic INR   Pulmonary hypertension   Nonspecific (abnormal) findings on radiological and other examination of biliary tract   Obstruction of bile duct   Acute respiratory failure with hypercapnia    LOS: 9 days   Amy Esterwood  08/15/2013, 9:44 AM    Attending physician's note   I have taken an interval history, reviewed the chart and examined the patient. I agree with the Advanced Practitioner's note, impression and recommendations.   Pricilla Riffle. Fuller Plan, MD [facility]

## 2013-08-15 NOTE — Progress Notes (Signed)
Patient refusing treatment. She is refusing to wear oxygen, refuses nasal swab for MRSA screen, frequently removes EKG leads, and has removed one of her IVs.

## 2013-08-15 NOTE — Progress Notes (Signed)
[facility] PROGRESS NOTE  [name] IZT:[id number] DOB: 05-08-1923 DOA: 08/06/2013 PCP: Loura Pardon, MD  Brief narrative  80 yo with a hx of afib on coumadin who initially presented with coumadin coagulopathy. Work up revealed elevated LFT's Further work up demonstrated a pancreatic head mass. GI was consulted with plans for ERCP. Unfortunately, the patient was noted to have wheezing and cxr findings of pulm edema. Steroids were started which resulted in markedly elevated glucose, now on insulin gtt. Lasix was stopped on 08/13/13 secondary to receiving IVF per insulin gtt protcol. ERCP now delayed tentatively for 08/14/13. INR has since been reversed with vitamin K and multiple units of FFP per GI.   Assessment/Plan:  A-Fib on chronic anticoagulation with supratherapeutic INR  - Presented with INR of over 15 s/p 2.5 mg vitamin K, now trending down off coumadin  - No signs of active bleeding  -resumed coumadin  Afib with RVR  patient went in to rapid afib in PACU on 1/14  Given 20 mg IV Cardizem push and HR improved to <100. Monitor on tele.  Will place on oral cardizem q 8hr  Acute pulmonary edema  Patient became acutely hypoxic post ERCP today with ABG showing hapercarbia  Placed on BiPAP and  transitioned to facemask. Given a dose of IV lasix and IV solumedrol and  transferred to ICU. CXR showed findings of CHF. Now transitioned to [place]. stable overnight. Continue nebs. Given another dose of IV lasix this am.  will check 2D ECHO Monitor I/O.  HTN  -Stable  -Cont current regimen  -Patient not on ACE/ARB secondary to poor renal function   Hyperlipidemia  -d/c'd statin secondary to elevated LFT's   Diabetes type 2 with peripheral neuropathy  -hemoglobin A1c of 8.3  -Glucose markedly elevated after starting solumedrol for wheezing on exam and went into DKA requiring insulin drip. Now AG closed. Placed on levemir and will add pre meal aspart depending upon her po intake and  fsg.    .  Bullous pemphigoid  -stable   Renal insufficiency  -Avoid all nephrotoxic medication   Elevated LFT's with pancreatic head Mass - Liver US on 08/09/13 demonstrated intrahepatic and common bile ductdilitation, cannot rule out CBD stone or mass . - pancreatic head mass noted on 08/10/13 CT abdomen , worrisome for adenocarcinoma  - Ca19-9 elevated at 2,758  - MRCP was ordered, however per staff, pt was apparently unable to tolerate procedure  - Appreciate GI input . ERCP done on 1/14 with stent placed and brushing done.. Will follow with results . Agree with GI, not a candidate for any aggressive treatment. Discussed this with patient and family who understand this.  -LFTs has not improved post stenting - Cont to avoid hepatotoxic drugs .  Hypokalemia  - Likely secondary to diuretics that were recently increased  - replenished   Code Status: Full  Family Communication: discussed with son at bedside  Disposition Plan: currently inpatient . Monitor on stepdown for today  HPI/Subjective:  Patient stable in stepdown overnight. Occasionally HR racing to 110s. transitioned to [place] from acemask  Objective: Filed Vitals:   08/15/13 1000  BP:   Pulse: 98  Temp:   Resp: 14    Intake/Output Summary (Last 24 hours) at 08/15/13 1102 Last data filed at 08/15/13 1000  Gross per 24 hour  Intake 2500.33 ml  Output   4260 ml  Net -1759.67 ml   Filed Weights   08/06/13 1720 08/15/13 0400  Weight: 110.904 kg (  244 lb 8 oz) 109.7 kg (241 lb 13.5 oz)    Exam: General: Elderly female lying bed  Appears restless  and irritable HEENT: no pallor, icterus, moist mucosa  Chest: diminished breath sounds at lung bases, no added sounds  Cardiovascular: S1&S2 irregularly irregular  Abd: soft, NT, ND, BS+foley in place  EXT: warm, no edema, jaundiced  CNS: AAOX2, hard of hearing   Data Reviewed: Basic Metabolic Panel:  Recent Labs Lab 08/12/13 0525  08/13/13 0315 08/13/13 0755  08/13/13 1619 08/14/13 0525 08/14/13 1531 08/14/13 1655 08/15/13 0400  NA 138  < > 131* 136* 134* 139 141 136* 137  K 3.0*  < > 3.5* 3.1* 3.5* 3.5* 3.6* 3.9 3.9  CL 93*  < > 87* 91* 92* 96  --  99 95*  CO2 29  < > 22 23 24 25   --  24 27  GLUCOSE 170*  < > 638* 429* 181* 167*  --  128* 168*  BUN 21  < > 32* 33* 32* 31*  --  32* 34*  CREATININE 1.43*  < > 1.60* 1.61* 1.57* 1.56*  --  1.25* 1.24*  CALCIUM 8.2*  < > 7.8* 8.4 8.7 9.1  --  8.4 8.8  MG 1.2*  --  1.4*  --  2.6* 2.4  --   --  2.0  < > = values in this interval not displayed. Liver Function Tests:  Recent Labs Lab 08/11/13 0616 08/12/13 0525 08/13/13 0315 08/14/13 0525 08/15/13 0400  AST 71* 71* 66* 103* 97*  ALT 44* 44* 41* 52* 57*  ALKPHOS 356* 375* 340* 347* 340*  BILITOT 13.2* 14.2* 16.7* 19.0* 19.7*  PROT 6.9 7.3 7.0 7.2 6.9  ALBUMIN 2.5* 2.6* 2.6* 2.6* 2.5*   No results found for this basename: LIPASE, AMYLASE,  in the last 168 hours  Recent Labs Lab 08/10/13 0843  AMMONIA RESULTS UNAVAILABLE DUE TO INTERFERING SUBSTANCE   CBC:  Recent Labs Lab 08/11/13 0616 08/12/13 0525 08/13/13 0315 08/14/13 0525 08/14/13 1531 08/15/13 0640  WBC 7.1 8.2 7.6 19.3*  --  15.1*  NEUTROABS 4.1 4.7 7.0 17.3*  --  13.2*  HGB 11.6* 12.7 11.3* 11.7* 13.9 11.3*  HCT 34.3* 37.8 32.6* 34.9* 41.0 34.9*  MCV 88.9 90.4 88.8 90.2  --  92.1  PLT 340 357 350 353  --  315   Cardiac Enzymes: No results found for this basename: CKTOTAL, CKMB, CKMBINDEX, TROPONINI,  in the last 168 hours BNP (last 3 results)  Recent Labs  03/07/13 1820  PROBNP 7836.0*   CBG:  Recent Labs Lab 08/14/13 1801 08/14/13 1902 08/14/13 2116 08/15/13 0303 08/15/13 0816  GLUCAP 110* 96 122* 156* 228*    No results found for this or any previous visit (from the past 240 hour(s)).   Studies: Dg Chest Port 1 View  08/15/2013   CLINICAL DATA:  Shortness of breath, CHF, pulmonary edema, history hypertension, atrial fibrillation, diabetes,  CHF, asthma, vaginal cancer  EXAM: PORTABLE CHEST - 1 VIEW  COMPARISON:  Portable exam 0908 hr compared to 08/14/2013  FINDINGS: Enlargement of cardiac silhouette.  Calcified tortuous aorta.  Pulmonary vascular congestion.  Improved interstitial infiltrates likely improved pulmonary edema.  Minimal basilar atelectasis bilaterally.  No gross pleural effusion or pneumothorax.  IMPRESSION: Probable CHF, slightly improved.   Electronically Signed   By: Lavonia Dana M.D.   On: 08/15/2013 09:26   Dg Chest Port 1 View  08/14/2013   CLINICAL DATA:  Shortness of breath,  history hypertension, diabetes, CHF  EXAM: PORTABLE CHEST - 1 VIEW  COMPARISON:  Portable exam [id number] hr compared to 08/12/2013  FINDINGS: Enlargement of cardiac silhouette with pulmonary vascular congestion.  Calcified tortuous thoracic aorta.  Scattered pulmonary infiltrates likely pulmonary edema and CHF.  No gross pleural effusion or pneumothorax.  IMPRESSION: Probable CHF.   Electronically Signed   By: Lavonia Dana M.D.   On: 08/14/2013 16:37   Dg Ercp  08/15/2013   CLINICAL DATA:  Common bile duct stricture, stent placement  EXAM: ERCP performed by Dr. Lucio Edward  TECHNIQUE: Multiple spot images obtained with the fluoroscopic device and submitted for interpretation post-procedure.  COMPARISON:  MRCP 08/09/2013; pancreas protocol CT 06/10/2014  FINDINGS: A total of 14 spot images obtained during ERCP and placement of a metallic biliary stent are submitted for radiologic interpretation. The initial images demonstrate an endoscope in the descending duodenum with cannulation of the common bile duct. Retrograde opacification demonstrates intra and extrahepatic biliary ductal dilatation. The distal common bile duct is unremarkable. In the mid common bile duct there is a focal high-grade stenosis. A wire was advanced beyond the stenosis into the intrahepatic bile ducts. The final images depict deployment of a self expanding covered metallic stent.   IMPRESSION: ERCP and placement of a self expanding metallic biliary stent as above.  These images were submitted for radiologic interpretation only. Please see the procedural report for the amount of contrast and the fluoroscopy time utilized.   Electronically Signed   By: Jacqulynn Cadet M.D.   On: 08/15/2013 08:13    Scheduled Meds: . diltiazem  30 mg Oral Q8H  . insulin aspart  0-15 Units Subcutaneous TID WC  . insulin aspart  3 Units Subcutaneous TID WC  . insulin detemir  40 Units Subcutaneous Q24H  . ipratropium-albuterol  3 mL Nebulization Q4H  . levothyroxine  150 mcg Oral QAC breakfast  . metoprolol  25 mg Oral BID  . pantoprazole  40 mg Oral Daily  . potassium chloride SA  40 mEq Oral q morning - 10a  . predniSONE  40 mg Oral Q breakfast  . Warfarin - Pharmacist Dosing Inpatient   Does not apply q1800   Continuous Infusions: . sodium chloride 20 mL/hr at 08/11/13 1935  . insulin (NOVOLIN-R) infusion Stopped (08/14/13 2030)  . lactated ringers        Time spent: 35 minutes    Zelma Mazariego, [facility] Pager [phone] If 7PM-7AM, please contact night-coverage at [email or web address], password [facility] 08/15/2013, 11:02 AM  LOS: 9 days

## 2013-08-15 NOTE — Progress Notes (Signed)
ANTICOAGULATION CONSULT NOTE - Follow-up Consult  Pharmacy Consult for Coumadin Indication: atrial fibrillation  Allergies  Allergen Reactions  . Ciprofloxacin     Nausea Possibly rash and itching- but not sure   . Gabapentin Nausea And Vomiting  . Insulin Glargine     Itching   . Latex     Rash   . Metformin And Related     Severe diarrhea   . Metoclopramide Hcl Nausea And Vomiting  . Nsaids Nausea And Vomiting  . Promethazine Hcl Other (See Comments)    Reaction unknown    Patient Measurements: Height: 5\' 2"  (157.5 cm) Weight: 241 lb 13.5 oz (109.7 kg) IBW/kg (Calculated) : 50.1  Vital Signs: Temp: 98.1 F (36.7 C) (01/15 0800) Temp src: Axillary (01/15 0800) BP: 150/60 mmHg (01/15 1140) Pulse Rate: 82 (01/15 1140)  Labs:  Recent Labs  08/13/13 0315  08/14/13 0525 08/14/13 1531 08/14/13 1655 08/15/13 0400 08/15/13 0640  HGB 11.3*  --  11.7* 13.9  --   --  11.3*  HCT 32.6*  --  34.9* 41.0  --   --  34.9*  PLT 350  --  353  --   --   --  315  LABPROT 15.2  --  13.5  --   --  13.6  --   INR 1.23  --  1.05  --   --  1.06  --   CREATININE 1.60*  < > 1.56*  --  1.25* 1.24*  --   < > = values in this interval not displayed.  Estimated Creatinine Clearance: 36.6 ml/min (by C-G formula based on Cr of 1.24).  Assessment: 80 yo F on chronic Coumadin for Afib; PMH also includes CKD and recent hospitalization for UTI tx with CTX-->cefuroxime, admitted 1/6 from PCP office d/t supratherapeutic INR (15.4 on admission)  Home warfarin dose - 2.5mg  daily except 5mg  on Mon  INR today = 1.06 following reversal with Vit K and FFP on 1/12, INR was supratherapeutic at admission and remained elevated until reversed for ERCP.  Suspect elevation d/t elevated LFTs  Suspect once LFTs improve following stent of CBD 1/14, warfarin sensitivity will improve  Bilirubin elevated and rising - obstructive jaundice secondary to pancreatic head mass.  Status-post stent bile duct via ERCP  1/14  Hgb and Hct stable, pltc WNL.  No overt bleeding reported.   Ok to resume coumadin last night, but since was not taking oral meds, no dose was ordered.   Goal of Therapy:  INR 2-3   Plan:   Due to warfarin sensitivity, start with low dose warfarin 1mg  po tonight and monitor INR trend closely  Check PT/INR daily    Doreene Eland, PharmD, BCPS.   Pager: [phone] 08/15/2013,12:31 PM.

## 2013-08-16 DIAGNOSIS — Z66 Do not resuscitate: Secondary | ICD-10-CM | POA: Insufficient documentation

## 2013-08-16 DIAGNOSIS — Z515 Encounter for palliative care: Secondary | ICD-10-CM

## 2013-08-16 DIAGNOSIS — Z7189 Other specified counseling: Secondary | ICD-10-CM

## 2013-08-16 LAB — CBC WITH DIFFERENTIAL/PLATELET
Basophils Absolute: 0 10*3/uL (ref 0.0–0.1)
Basophils Relative: 0 % (ref 0–1)
EOS ABS: 0 10*3/uL (ref 0.0–0.7)
Eosinophils Relative: 0 % (ref 0–5)
HEMATOCRIT: 33.9 % — AB (ref 36.0–46.0)
Hemoglobin: 11.2 g/dL — ABNORMAL LOW (ref 12.0–15.0)
Lymphocytes Relative: 10 % — ABNORMAL LOW (ref 12–46)
Lymphs Abs: 1.2 10*3/uL (ref 0.7–4.0)
MCH: 29.9 pg (ref 26.0–34.0)
MCHC: 33 g/dL (ref 30.0–36.0)
MCV: 90.6 fL (ref 78.0–100.0)
MONO ABS: 1.2 10*3/uL — AB (ref 0.1–1.0)
Monocytes Relative: 9 % (ref 3–12)
NEUTROS PCT: 81 % — AB (ref 43–77)
Neutro Abs: 10.4 10*3/uL — ABNORMAL HIGH (ref 1.7–7.7)
Platelets: 352 10*3/uL (ref 150–400)
RBC: 3.74 MIL/uL — AB (ref 3.87–5.11)
RDW: 18.9 % — ABNORMAL HIGH (ref 11.5–15.5)
WBC: 12.8 10*3/uL — ABNORMAL HIGH (ref 4.0–10.5)

## 2013-08-16 LAB — PROTIME-INR
INR: 1.03 (ref 0.00–1.49)
Prothrombin Time: 13.3 seconds (ref 11.6–15.2)

## 2013-08-16 LAB — COMPREHENSIVE METABOLIC PANEL
ALBUMIN: 2.3 g/dL — AB (ref 3.5–5.2)
ALT: 46 U/L — ABNORMAL HIGH (ref 0–35)
AST: 49 U/L — AB (ref 0–37)
Alkaline Phosphatase: 313 U/L — ABNORMAL HIGH (ref 39–117)
BUN: 41 mg/dL — ABNORMAL HIGH (ref 6–23)
CALCIUM: 8.6 mg/dL (ref 8.4–10.5)
CO2: 28 mEq/L (ref 19–32)
CREATININE: 1.35 mg/dL — AB (ref 0.50–1.10)
Chloride: 92 mEq/L — ABNORMAL LOW (ref 96–112)
GFR calc Af Amer: 39 mL/min — ABNORMAL LOW (ref >90)
GFR calc non Af Amer: 34 mL/min — ABNORMAL LOW (ref >90)
Glucose, Bld: 349 mg/dL — ABNORMAL HIGH (ref 70–99)
Potassium: 4.3 mEq/L (ref 3.7–5.3)
SODIUM: 136 meq/L — AB (ref 137–147)
TOTAL PROTEIN: 6.7 g/dL (ref 6.0–8.3)
Total Bilirubin: 16 mg/dL — ABNORMAL HIGH (ref 0.3–1.2)

## 2013-08-16 LAB — GLUCOSE, CAPILLARY
GLUCOSE-CAPILLARY: 307 mg/dL — AB (ref 70–99)
Glucose-Capillary: 225 mg/dL — ABNORMAL HIGH (ref 70–99)
Glucose-Capillary: 278 mg/dL — ABNORMAL HIGH (ref 70–99)

## 2013-08-16 MED ORDER — MAGIC MOUTHWASH
10.0000 mL | Freq: Three times a day (TID) | ORAL | Status: DC
  Administered 2013-08-16 – 2013-08-18 (×5): 10 mL via ORAL
  Administered 2013-08-18: 5 mL via ORAL
  Administered 2013-08-18 – 2013-08-19 (×2): 10 mL via ORAL
  Filled 2013-08-16 (×11): qty 10

## 2013-08-16 MED ORDER — IPRATROPIUM-ALBUTEROL 0.5-2.5 (3) MG/3ML IN SOLN
3.0000 mL | Freq: Four times a day (QID) | RESPIRATORY_TRACT | Status: DC
  Administered 2013-08-16: 3 mL via RESPIRATORY_TRACT
  Filled 2013-08-16 (×2): qty 3

## 2013-08-16 MED ORDER — DILTIAZEM HCL ER COATED BEADS 120 MG PO CP24
120.0000 mg | ORAL_CAPSULE | Freq: Every day | ORAL | Status: DC
Start: 2013-08-16 — End: 2013-08-19
  Administered 2013-08-16 – 2013-08-19 (×4): 120 mg via ORAL
  Filled 2013-08-16 (×4): qty 1

## 2013-08-16 MED ORDER — FAMOTIDINE IN NACL 20-0.9 MG/50ML-% IV SOLN
20.0000 mg | Freq: Two times a day (BID) | INTRAVENOUS | Status: DC
  Filled 2013-08-16: qty 50

## 2013-08-16 MED ORDER — ONDANSETRON 8 MG PO TBDP
8.0000 mg | ORAL_TABLET | Freq: Two times a day (BID) | ORAL | Status: DC
  Administered 2013-08-16 – 2013-08-19 (×6): 8 mg via ORAL
  Filled 2013-08-16 (×7): qty 1

## 2013-08-16 MED ORDER — INSULIN ASPART 100 UNIT/ML ~~LOC~~ SOLN: 0.0000 [IU] | Freq: Every day | SUBCUTANEOUS | Status: DC

## 2013-08-16 MED ORDER — FUROSEMIDE 10 MG/ML IJ SOLN: 20.0000 mg | Freq: Every day | INTRAMUSCULAR | Status: DC

## 2013-08-16 MED ORDER — ONDANSETRON HCL 4 MG PO TABS
4.0000 mg | ORAL_TABLET | Freq: Four times a day (QID) | ORAL | Status: DC | PRN
  Administered 2013-08-16 – 2013-08-17 (×2): 4 mg via ORAL
  Filled 2013-08-16 (×2): qty 1

## 2013-08-16 MED ORDER — GLUCERNA SHAKE PO LIQD
237.0000 mL | Freq: Two times a day (BID) | ORAL | Status: DC
  Administered 2013-08-16: 237 mL via ORAL

## 2013-08-16 MED ORDER — DEXAMETHASONE SODIUM PHOSPHATE 4 MG/ML IJ SOLN
2.0000 mg | Freq: Two times a day (BID) | INTRAMUSCULAR | Status: DC
  Filled 2013-08-16 (×6): qty 0.5

## 2013-08-16 MED ORDER — FLUCONAZOLE 100 MG PO TABS
100.0000 mg | ORAL_TABLET | Freq: Every day | ORAL | Status: DC
  Administered 2013-08-17 – 2013-08-19 (×3): 100 mg via ORAL
  Filled 2013-08-16 (×3): qty 1

## 2013-08-16 MED ORDER — INSULIN ASPART 100 UNIT/ML ~~LOC~~ SOLN
6.0000 [IU] | Freq: Three times a day (TID) | SUBCUTANEOUS | Status: DC
  Administered 2013-08-16 (×2): 6 [IU] via SUBCUTANEOUS

## 2013-08-16 MED ORDER — DILTIAZEM HCL 30 MG PO TABS
30.0000 mg | ORAL_TABLET | Freq: Four times a day (QID) | ORAL | Status: DC
  Administered 2013-08-16 (×2): 30 mg via ORAL
  Filled 2013-08-16 (×5): qty 1

## 2013-08-16 MED ORDER — OXYCODONE HCL 20 MG/ML PO CONC
5.0000 mg | ORAL | Status: DC | PRN
  Administered 2013-08-17: 5 mg via ORAL
  Filled 2013-08-16 (×2): qty 1

## 2013-08-16 MED ORDER — WARFARIN SODIUM 2 MG PO TABS
2.0000 mg | ORAL_TABLET | Freq: Once | ORAL | Status: DC
  Filled 2013-08-16: qty 1

## 2013-08-16 MED ORDER — FLUCONAZOLE IN SODIUM CHLORIDE 200-0.9 MG/100ML-% IV SOLN
200.0000 mg | Freq: Once | INTRAVENOUS | Status: DC
  Filled 2013-08-16: qty 100

## 2013-08-16 MED ORDER — INSULIN ASPART 100 UNIT/ML ~~LOC~~ SOLN: 0.0000 [IU] | Freq: Three times a day (TID) | SUBCUTANEOUS | Status: DC

## 2013-08-16 MED ORDER — INSULIN DETEMIR 100 UNIT/ML ~~LOC~~ SOLN
50.0000 [IU] | SUBCUTANEOUS | Status: DC
  Filled 2013-08-16: qty 0.5

## 2013-08-16 NOTE — Progress Notes (Addendum)
Patient ID: [name], female   DOB: 05/16/1923, 80 y.o.   MRN: [id number]  [facility] Progress Note  Subjective: Talkative, a little confused. No c/o abdominal pain  or SOB. Nauseated today. Says she has been told of path results-worried about her sons  Objective:  Vital signs in last 24 hours: Temp:  [97.4 F (36.3 C)-98.1 F (36.7 C)] 97.4 F (36.3 C) (01/16 0400) Pulse Rate:  [79-118] 118 (01/16 0610) Resp:  [15-24] 15 (01/16 0610) BP: (111-150)/(43-70) 114/61 mmHg (01/16 0610) SpO2:  [87 %-100 %] 96 % (01/16 0610) Weight:  [231 lb 11.3 oz (105.1 kg)] 231 lb 11.3 oz (105.1 kg) (01/16 0400) Last BM Date: 08/11/13 General:   Alert,  Well-developed, elderly female   in NAD. Jaundiced Heart:  irregular rate and rhythm; no murmurs Pulm;scatted rhonchi, decreased BS Abdomen:  Soft, nontender and nondistended. Normal bowel sounds, without guarding, and without rebound.   Extremities:  Without edema. Neurologic:  Alert and  oriented x3   Psych:  Alert and cooperative. Normal mood and affect.  Intake/Output from previous day: 01/15 0701 - 01/16 0700 In: 870 [P.O.:870] Out: 2825 [Urine:2825] Intake/Output this shift:    Lab Results:  Recent Labs  08/14/13 0525 08/14/13 1531 08/15/13 0640 08/16/13 0345  WBC 19.3*  --  15.1* 12.8*  HGB 11.7* 13.9 11.3* 11.2*  HCT 34.9* 41.0 34.9* 33.9*  PLT 353  --  315 352   BMET  Recent Labs  08/14/13 1655 08/15/13 0400 08/16/13 0345  NA 136* 137 136*  K 3.9 3.9 4.3  CL 99 95* 92*  CO2 24 27 28   GLUCOSE 128* 168* 349*  BUN 32* 34* 41*  CREATININE 1.25* 1.24* 1.35*  CALCIUM 8.4 8.8 8.6   LFT  Recent Labs  08/16/13 0345  PROT 6.7  ALBUMIN 2.3*  AST 49*  ALT 46*  ALKPHOS 313*  BILITOT 16.0*   PT/INR  Recent Labs  08/15/13 0400 08/16/13 0345  LABPROT 13.6 13.3  INR 1.06 1.03    Assessment / Plan: #1 80 yo female with bx proven adenocarcinoma of the pancreas with biliary obstruction. Bili  now trending down. Stable s/p ERCP and stent placement Would not plan any aggressive treatment for her pancreatic cancer secondary to advanced age and multiple serious comorbidities We will sign off, available if can be of help  Active Problems:   HYPERLIPIDEMIA   HYPERTENSION   Atrial fibrillation   Irritable bowel syndrome   Rosacea   DM type 2 with diabetic peripheral neuropathy   Anemia   History of cerebrovascular disease   Supratherapeutic INR   Pulmonary hypertension   Nonspecific (abnormal) findings on radiological and other examination of biliary tract   Obstruction of bile duct   Acute respiratory failure with hypercapnia    LOS: 10 days   Amy Esterwood  08/16/2013, 11:26 AM     Attending physician's note   I have taken an interval history, reviewed the chart and examined the patient. I agree with the Advanced Practitioner's note, impression and recommendations. Pancreatic adenocarcinoma with biliary obstruction S/P ERCP with stent placement for palliation of obstruction and jaundice. LFTs/bilirubin trending down. I do not feel that she is a candidate for additional pancreatic cancer treatment. Consider palliative care, hospice options. GI signing off. Please call if needed.   Pricilla Riffle. Fuller Plan, MD [facility]

## 2013-08-16 NOTE — Progress Notes (Signed)
INITIAL NUTRITION ASSESSMENT  DOCUMENTATION CODES Per approved criteria  -Morbid Obesity   INTERVENTION: - Glucerna shakes BID - Encouraged increased meal intake - Downgrade diet to mechanical soft (dysphagia 3) - Will continue to monitor   NUTRITION DIAGNOSIS: Inadequate oral intake related to poor appetite, problems chewing as evidenced by <25% meal intake.   Goal: Pt to consume >90% of meals/supplements  Monitor:  Weights, labs, intake  Reason for Assessment: Poor intake  80 y.o. female  Admitting Dx: Supratherapeutic INR  ASSESSMENT: Pt with hx of atrial fibrillation (on Coumadin), HLD, HTN, hypothyroid, irritable bowel syndrome, asthma, CHF, chronic renal insufficiency, diabetes type 2 (uncontrolled), Hx CVA, iron deficiency anemia. She was recently admitted on 07/23/2013 for altered mental status secondary to UTI treated with ceftriaxone.  Met with pt and son who report pt with poor appetite since July 2014. Reports she fell and landed on the left side of her face and has had problems chewing ever since. States it feels like she has "wooden teeth". Pt reports eating only 1 meal/day. States she has no appetite. Only thing she wants to eat is ice. Thinks she gained 10 pounds in the past month from being on prednisone. Agreeable to changing to mechanical soft diet and trying Glucerna shakes.   Sodium slightly low Alk phos, AST/ATL, and total bilirubin elevated    Height: Ht Readings from Last 1 Encounters:  08/06/13 _0  (1.575 m)    Weight: Wt Readings from Last 1 Encounters:  08/16/13 231 lb 11.3 oz (105.1 kg)    Ideal Body Weight: 110 lb   % Ideal Body Weight: 210%  Wt Readings from Last 10 Encounters:  08/16/13 231 lb 11.3 oz (105.1 kg)  08/16/13 231 lb 11.3 oz (105.1 kg)  07/24/13 254 lb 1.6 oz (115.259 kg)  03/03/11 253 lb (114.76 kg)  03/07/13 240 lb 11.9 oz (109.2 kg)  12/05/12 238 lb (107.956 kg)  09/27/12 249 lb (112.946 kg)  07/17/12 254 lb 12  oz (115.554 kg)  06/21/12 248 lb 0.3 oz (112.5 kg)  10/20/11 237 lb 8 oz (107.729 kg)    Usual Body Weight: 221 lb per pt  % Usual Body Weight: 105%  BMI:  Body mass index is 42.37 kg/(m^2). Class III extreme obesity   Estimated Nutritional Needs: Kcal: 1400-1600 Protein: 60-75g Fluid: 1.4-1.6L/day  Skin: +1 RLE, LLE edema  Diet Order: Carb Control  EDUCATION NEEDS: -No education needs identified at this time   Intake/Output Summary (Last 24 hours) at 08/16/13 1126 Last data filed at 08/16/13 0000  Gross per 24 hour  Intake    510 ml  Output   1800 ml  Net  -1290 ml    Last BM: 1/11  Labs:   Recent Labs Lab 08/13/13 1619 08/14/13 0525  08/14/13 1655 08/15/13 0400 08/16/13 0345  NA 134* 139  < > 136* 137 136*  K 3.5* 3.5*  < > 3.9 3.9 4.3  CL 92* 96  --  99 95* 92*  CO2 24 25  --  _1 BUN 32* 31*  --  32* 34* 41*  CREATININE 1.57* 1.56*  --  1.25* 1.24* 1.35*  CALCIUM 8.7 9.1  --  8.4 8.8 8.6  MG 2.6* 2.4  --   --  2.0  --   GLUCOSE 181* 167*  --  128* 168* 349*  < > = values in this interval not displayed.  CBG (last 3)   Recent Labs  08/15/13 1205 08/15/13  1626 08/15/13 2131  GLUCAP 277* 335* 292*    Scheduled Meds: . diltiazem  30 mg Oral Q6H  . insulin aspart  0-15 Units Subcutaneous TID WC  . insulin aspart  6 Units Subcutaneous TID WC  . insulin detemir  50 Units Subcutaneous Q24H  . ipratropium-albuterol  3 mL Nebulization Q6H WA  . levothyroxine  150 mcg Oral QAC breakfast  . metoprolol  25 mg Oral BID  . pantoprazole  40 mg Oral Daily  . potassium chloride SA  40 mEq Oral q morning - 10a  . predniSONE  40 mg Oral Q breakfast  . warfarin  2 mg Oral ONCE-1800  . Warfarin - Pharmacist Dosing Inpatient   Does not apply q1800    Continuous Infusions: . sodium chloride 20 mL/hr at 08/11/13 1935    Past Medical History  Diagnosis Date  . Atrial fibrillation   . GERD (gastroesophageal reflux disease)   . Hyperlipidemia   .  Hypertension   . Hypothyroid   . Obesity   . Vaginal dysplasia 1980  . Rosacea   . Chest pain 11/2002    cardiac cath neg   . Back pain   . IBS (irritable bowel syndrome)     with diarrhea  . Asthma     as a child  . Adenomatous colon polyp 12/2007  . Hemorrhoids   . Vertigo   . Hiatal hernia 12/2007    EGD  . CHF (congestive heart failure)   . Complication of anesthesia     "dr said I should never be put to sleep again" (03/07/2013)  . Exertional shortness of breath   . Type II diabetes mellitus   . Iron deficiency anemia   . Arthritis     "all over my body" (03/07/2013)  . Vaginal cancer   . History of cerebrovascular disease 04/24/2013  . Abnormality of gait 04/24/2013  . PONV (postoperative nausea and vomiting)     Past Surgical History  Procedure Laterality Date  . Cholecystectomy  2006  . Joint replacement      Rt TKR [place] 2001  . Cervical cone biopsy  1963    D&C  . Cystocele repair  1978    /rectocele  . Orif ankle fracture Right 2006  . Total knee arthroplasty Right 1993  . Total knee arthroplasty Left 2001  . Cataract extraction w/ intraocular lens  implant, bilateral Bilateral 2006  . Ankle fracture surgery Right 2011    fall/followed by rehab stay; "hand to have 9 pins put in it" (03/07/2013)  . Cardiac catheterization  11/2002    Archie Endo 12/11/2002 (03/07/2013)  . Appendectomy  1941    Archie Endo 11/17/1999 (03/07/2013)  . Abdominal hysterectomy  1963    partial/notes 11/17/1999 (03/07/2013  . Ercp N/A 08/14/2013    Procedure: ENDOSCOPIC RETROGRADE CHOLANGIOPANCREATOGRAPHY (ERCP);  Surgeon: Ladene Artist, MD;  Location: [facility];  Service: Endoscopy;  Laterality: N/A;    Mikey College MS, [place], [place] Pager [phone] After Hours Pager

## 2013-08-16 NOTE — Progress Notes (Signed)
[facility] PROGRESS NOTE  [name] ZOX:[id number] DOB: [birth year] DOA: 08/06/2013 PCP: Loura Pardon, MD  Brief narrative  80 yo with a hx of afib on coumadin who initially presented with coumadin coagulopathy. Work up revealed elevated LFT's Further work up demonstrated a pancreatic head mass. GI was consulted with plans for ERCP. Unfortunately, the patient was noted to have wheezing and cxr findings of pulm edema. Steroids were started which resulted in markedly elevated glucose, now on insulin gtt. Lasix was stopped on 08/13/13 secondary to receiving IVF per insulin gtt protcol. ERCP now delayed tentatively for 08/14/13. INR has since been reversed with vitamin K and multiple units of FFP per GI.   Assessment/Plan:  A-Fib on chronic anticoagulation with supratherapeutic INR  - Presented with INR of over 15 s/p 2.5 mg vitamin K, now trending down off coumadin  - No signs of active bleeding  -d/c coumadin as pt is now full comfort  Afib with RVR  patient went in to rapid afib in PACU on 1/14  Given 20 mg IV Cardizem push and HR improved to <100. Monitor on tele.  Will place on long acting cardizem   Pancreatic adenocarcnoma - Liver US on 08/09/13 demonstrated intrahepatic and common bile ductdilitation, cannot rule out CBD stone or mass .  - pancreatic head mass noted on 08/10/13 CT abdomen , worrisome for adenocarcinoma  - Ca19-9 elevated at 2,758   ERCP done on 1/14 with stent placed . Brushing sent to pathology shows adenocarcinoma  recommend palliative care and hospice -Outpatient palliative care consult. Now made for comfort. Ordered Ativan for agitation and sleep, condition all IV meds to by mouth. Started on Pepcid, Decadron and Zofran for nausea. oxyfast SL for dyspnea  Acute pulmonary edema  Patient became acutely hypoxic post ERCP on 1/14 with ABG showing hapercarbia  Placed on BiPAP and transitioned to facemask. Given a dose of IV lasix and IV solumedrol and  transferred to ICU. CXR showed findings of CHF. Now transitioned to [place].  Continue nebs. Echo with normal EF  HTN  -Stable    Hyperlipidemia  -d/c'd statin secondary to elevated LFT's   Diabetes type 2 with peripheral neuropathy  -hemoglobin A1c of 8.3  Since she is made full comfort , will hold further glucose monitoring .  Bullous pemphigoid  -stable   Renal insufficiency  -Avoid all nephrotoxic medication    Hypokalemia  - Likely secondary to diuretics that were recently increased  - replenished    Code Status: DNR eith full comfort Family Communication: discussed with son at bedside  Disposition Plan: home with hospice  HPI/Subjective:  Patient seen and examined this morning. Stable overnight. Appears confused at times.     Objective: Filed Vitals:   08/16/13 1531  BP: 127/50  Pulse: 67  Temp: 98 F (36.7 C)  Resp: 16    Intake/Output Summary (Last 24 hours) at 08/16/13 1649 Last data filed at 08/16/13 1100  Gross per 24 hour  Intake    270 ml  Output    925 ml  Net   -655 ml   Filed Weights   08/06/13 1720 08/15/13 0400 08/16/13 0400  Weight: 110.904 kg (244 lb 8 oz) 109.7 kg (241 lb 13.5 oz) 105.1 kg (231 lb 11.3 oz)    Exam:  General: Elderly female lying bed  HEENT: no pallor, icterus, moist mucosa  Chest: diminished breath sounds at lung bases, no added sounds  Cardiovascular: S1&S2 irregularly irregular  Abd: soft, NT, ND,  BS+foley in place  EXT: warm, no edema, jaundiced  CNS: AAOX2, hard of hearing  Data Reviewed: Basic Metabolic Panel:  Recent Labs Lab 08/12/13 0525  08/13/13 0315  08/13/13 1619 08/14/13 0525 08/14/13 1531 08/14/13 1655 08/15/13 0400 08/16/13 0345  NA 138  < > 131*  < > 134* 139 141 136* 137 136*  K 3.0*  < > 3.5*  < > 3.5* 3.5* 3.6* 3.9 3.9 4.3  CL 93*  < > 87*  < > 92* 96  --  99 95* 92*  CO2 29  < > 22  < > 24 25  --  24 27 28   GLUCOSE 170*  < > 638*  < > 181* 167*  --  128* 168* 349*  BUN 21  < >  32*  < > 32* 31*  --  32* 34* 41*  CREATININE 1.43*  < > 1.60*  < > 1.57* 1.56*  --  1.25* 1.24* 1.35*  CALCIUM 8.2*  < > 7.8*  < > 8.7 9.1  --  8.4 8.8 8.6  MG 1.2*  --  1.4*  --  2.6* 2.4  --   --  2.0  --   < > = values in this interval not displayed. Liver Function Tests:  Recent Labs Lab 08/12/13 0525 08/13/13 0315 08/14/13 0525 08/15/13 0400 08/16/13 0345  AST 71* 66* 103* 97* 49*  ALT 44* 41* 52* 57* 46*  ALKPHOS 375* 340* 347* 340* 313*  BILITOT 14.2* 16.7* 19.0* 19.7* 16.0*  PROT 7.3 7.0 7.2 6.9 6.7  ALBUMIN 2.6* 2.6* 2.6* 2.5* 2.3*   No results found for this basename: LIPASE, AMYLASE,  in the last 168 hours  Recent Labs Lab 08/10/13 0843  AMMONIA RESULTS UNAVAILABLE DUE TO INTERFERING SUBSTANCE   CBC:  Recent Labs Lab 08/12/13 0525 08/13/13 0315 08/14/13 0525 08/14/13 1531 08/15/13 0640 08/16/13 0345  WBC 8.2 7.6 19.3*  --  15.1* 12.8*  NEUTROABS 4.7 7.0 17.3*  --  13.2* 10.4*  HGB 12.7 11.3* 11.7* 13.9 11.3* 11.2*  HCT 37.8 32.6* 34.9* 41.0 34.9* 33.9*  MCV 90.4 88.8 90.2  --  92.1 90.6  PLT 357 350 353  --  315 352   Cardiac Enzymes: No results found for this basename: CKTOTAL, CKMB, CKMBINDEX, TROPONINI,  in the last 168 hours BNP (last 3 results)  Recent Labs  03/07/13 1820  PROBNP 7836.0*   CBG:  Recent Labs Lab 08/15/13 1205 08/15/13 1626 08/15/13 2131 08/16/13 0749 08/16/13 1126  GLUCAP 277* 335* 292* 307* 225*    No results found for this or any previous visit (from the past 240 hour(s)).   Studies: Dg Chest Port 1 View  08/15/2013   CLINICAL DATA:  Shortness of breath, CHF, pulmonary edema, history hypertension, atrial fibrillation, diabetes, CHF, asthma, vaginal cancer  EXAM: PORTABLE CHEST - 1 VIEW  COMPARISON:  Portable exam 0908 hr compared to 08/14/2013  FINDINGS: Enlargement of cardiac silhouette.  Calcified tortuous aorta.  Pulmonary vascular congestion.  Improved interstitial infiltrates likely improved pulmonary edema.   Minimal basilar atelectasis bilaterally.  No gross pleural effusion or pneumothorax.  IMPRESSION: Probable CHF, slightly improved.   Electronically Signed   By: Lavonia Dana M.D.   On: 08/15/2013 09:26    Scheduled Meds: . dexamethasone  2 mg Intravenous BID WC  . diltiazem  30 mg Oral Q6H  . famotidine (PEPCID) IV  20 mg Intravenous Q12H  . fluconazole (DIFLUCAN) IV  200 mg Intravenous Once  . [  START ON 08/17/2013] fluconazole  100 mg Oral Daily  . furosemide  20 mg Intravenous Daily  . insulin aspart  0-20 Units Subcutaneous TID WC  . insulin aspart  0-5 Units Subcutaneous QHS  . insulin detemir  50 Units Subcutaneous Q24H  . levothyroxine  150 mcg Oral QAC breakfast  . magic mouthwash  10 mL Oral TID  . metoprolol  25 mg Oral BID  . ondansetron  8 mg Oral Q12H  . pantoprazole  40 mg Oral Daily  . potassium chloride SA  40 mEq Oral q morning - 10a   Continuous Infusions: . sodium chloride 20 mL/hr at 08/11/13 1935      Time spent: 25 minutes    Louellen Molder  [facility] Pager [phone]. If 7PM-7AM, please contact night-coverage at [email or web address], password [facility] 08/16/2013, 4:49 PM  LOS: 10 days

## 2013-08-16 NOTE — Consult Note (Signed)
Palliative Medicine Team at [facility]  Date: 08/16/2013   Patient Name: [name]  DOB: [birth year]  MRN: [id number]  Age / Sex: 80 y.o., female   PCP: Abner Greenspan, MD Referring Physician: Louellen Molder, MD  HPI/Reason for Consultation: 80 yo with newly diagnosed with Pancreatic Cancer. Painless jaundice, s/p bili stent with post-procedure complications including pulmonary edema and Afib with RVR. Multiple co-morbidities, poor functional status with many falls at home and poor prognosis. PMT consulted for goals of care.  Participants in Discussion: Patient, her husband, her son and step-daughter  Goals/Summary of Discussion:  1. Code Status:  DNR  2. Scope of Treatment:  Full Comfort  Maintain medical interventions for reversible problems  Discontinue Coumadin, risk >benefits  Symptom Management  3. Assessment/Plan:  Primary Diagnoses  1. Pancreatic Adenocarcinoma, metastatic, lung, biliary tract 2. CHF, pulmonary edema 3. Obesity 4. Poorly controlled Diabetes 5. Thrush, and cutaneous candidiasis  Symptom Management/Recommendations:  Oxyfast SL 5-10 mg q4 prn pain and dyspnea  Ativan Intensol SL 0.5 q4 prn agitation/sleep  Transition IV meds to PO on discharge  Start Pepcid, Decadron and scheduled Zofran for nausea related to obstruction/celiac irritation, if Zofran not effective can try Reglan  Son inquired about the use of canabis oil (CBD oil)-I educated him on medical use of marrinol -if his mother's nausea persists this would be a reasonable next step.   Prognosis: <6 weeks if not less  PPS 40%   Active Symptoms 1. Severe Nausea 2. Confusion 3. Pain, abdominal  4. Palliative Prophylaxis:   Bowel Regimen   Terminal Secretions  Breakthrough Pain and Dyspnea   Agitation and Delirium  Nausea  5. Psychosocial Spiritual Asssessment/Interventions:  Patient and Family Adjustment to Illness/Prognosis: Fair coping, husband is hoping for  a miricle-doesn't like to say the word cancer.  Spiritual Concerns or Needs: High, chaplain consult  6. Disposition: Home with hospice when medically stable and arrangements can be made for safe discharge  Time: 50 minutes. Greater than 50%  of this time was spent counseling and coordinating care related to the above assessment and plan.  Signed by: Acquanetta Chain, DO  08/16/2013, 3:11 PM  Please contact Palliative Medicine Team phone at [phone] for questions and concerns.

## 2013-08-16 NOTE — Progress Notes (Signed)
RECEIVED HOME HOSPICE REFERRAL.PROVIDED PATIENT W/HOME HOSPICE PROVIDER LIST TO CHOOSE.

## 2013-08-16 NOTE — Progress Notes (Signed)
Approached outside of pt's room by pt's step-daughters concerning discharge plans. Verbalized to me that they are uncomfortable with her going home since her husband is her only caregiver and is in poor health as well. Also, verbalized concerns about the physical environment of the home being unsuitable for her level of care. Provided support and assurance that we would make sure the proper arrangements were made. Patient resting comfortably at this time. Will continue to monitor. Blanchard Kelch, RN

## 2013-08-16 NOTE — Progress Notes (Signed)
Inpatient Diabetes Program Recommendations  AACE/ADA: New Consensus Statement on Inpatient Glycemic Control (2013)  Target Ranges:  Prepandial:   less than 140 mg/dL      Peak postprandial:   less than 180 mg/dL (1-2 hours)      Critically ill patients:  140 - 180 mg/dL   Results for KADYN, CHOVAN (MRN [id number]) as of 08/16/2013 11:53  Ref. Range 08/15/2013 12:05 08/15/2013 16:26 08/15/2013 21:31 08/16/2013 07:49 08/16/2013 11:26  Glucose-Capillary Latest Range: 70-99 mg/dL 277 (H) 335 (H) 292 (H) 307 (H) 225 (H)    Inpatient Diabetes Program Recommendations Insulin - Basal: Noted Levemir increased from 40 units to 50 units Q24H. Correction (SSI): Please consider adding Novolog bedtime correction scale.  Thanks, Barnie Alderman, RN, MSN, CCRN Diabetes Coordinator Inpatient Diabetes Program [phone] (Team Pager) [phone] (AP office) [phone] [facility] office)

## 2013-08-16 NOTE — Progress Notes (Signed)
ANTICOAGULATION CONSULT NOTE - Follow-up Consult  Pharmacy Consult for Coumadin Indication: atrial fibrillation  Allergies  Allergen Reactions  . Ciprofloxacin     Nausea Possibly rash and itching- but not sure   . Gabapentin Nausea And Vomiting  . Insulin Glargine     Itching   . Latex     Rash   . Metformin And Related     Severe diarrhea   . Metoclopramide Hcl Nausea And Vomiting  . Nsaids Nausea And Vomiting  . Promethazine Hcl Other (See Comments)    Reaction unknown    Patient Measurements: Height: _0  (157.5 cm) Weight: 231 lb 11.3 oz (105.1 kg) IBW/kg (Calculated) : 50.1  Vital Signs: Temp: 97.4 F (36.3 C) (01/16 0400) Temp src: Oral (01/16 0400) BP: 114/61 mmHg (01/16 0610) Pulse Rate: 118 (01/16 0610)  Labs:  Recent Labs  08/14/13 0525 08/14/13 1531 08/14/13 1655 08/15/13 0400 08/15/13 0640 08/16/13 0345  HGB 11.7* 13.9  --   --  11.3* 11.2*  HCT 34.9* 41.0  --   --  34.9* 33.9*  PLT 353  --   --   --  315 352  LABPROT 13.5  --   --  13.6  --  13.3  INR 1.05  --   --  1.06  --  1.03  CREATININE 1.56*  --  1.25* 1.24*  --  1.35*    Estimated Creatinine Clearance: 32.8 ml/min (by C-G formula based on Cr of 1.35).  Assessment: 80 yo F on chronic Coumadin for Afib; PMH also includes CKD and recent hospitalization for UTI tx with CTX-->cefuroxime, admitted 1/6 from PCP office d/t supratherapeutic INR (15.4 on admission)  Home warfarin dose - 2.30m daily except 522mon Mon  INR today = 1.03 following reversal with Vit K and FFP on 1/12.  INR remains at baseline after reversal and low/conservative dose last night  INR was supratherapeutic at admission and remained elevated until reversed for ERCP.  Suspect elevation d/t elevated LFTs.  LFT's improved 1/16 to 49/46, alk phos 313  Suspect once LFTs improve following stent of CBD 1/14, warfarin sensitivity will improve  Bilirubin elevated and rising - obstructive jaundice secondary to pancreatic  head mass.  Status-post stent bile duct via ERCP 1/14  Hgb and Hct stable, pltc WNL.  No overt bleeding reported.   Ok to resume coumadin 1/14, but since was not taking oral meds, no dose was ordered - Patient received 1 mg on 1/5  Goal of Therapy:  INR 2-3   Plan:   Warfarin 2 mg tonight at 1800 - resuming conservatively secondary to elevated INR on admission   Check PT/INR daily    Evyn Kooyman, MaGaye AlkenharmD Pager #: [phone]:11 AM 08/16/2013

## 2013-08-17 DIAGNOSIS — C259 Malignant neoplasm of pancreas, unspecified: Secondary | ICD-10-CM | POA: Diagnosis present

## 2013-08-17 MED ORDER — HYDROXYZINE HCL 25 MG PO TABS
25.0000 mg | ORAL_TABLET | Freq: Three times a day (TID) | ORAL | Status: DC | PRN
  Administered 2013-08-19: 25 mg via ORAL
  Filled 2013-08-17: qty 1

## 2013-08-17 NOTE — Progress Notes (Signed)
[facility] PROGRESS NOTE  [name] GBT:[id number] DOB: 1922-10-24 DOA: 08/06/2013 PCP: Loura Pardon, MD  Brief narrative  80 yo with a hx of afib on coumadin who initially presented with coumadin coagulopathy. Work up revealed elevated LFT's Further work up demonstrated a pancreatic head mass. GI was consulted with plans for ERCP. Unfortunately, the patient was noted to have wheezing and cxr findings of pulm edema. Steroids were started which resulted in markedly elevated glucose, now on insulin gtt. Lasix was stopped on 08/13/13 secondary to receiving IVF per insulin gtt protcol. ERCP now delayed tentatively for 08/14/13. INR has since been reversed with vitamin K and multiple units of FFP per GI.   Assessment/Plan:  A-Fib on chronic anticoagulation with supratherapeutic INR  - Presented with INR of over 15 s/p 2.5 mg vitamin K, now trending down off coumadin  - No signs of active bleeding  -d/c coumadin as pt is now full comfort  Afib with RVR  patient went in to rapid afib in PACU on 1/14 . Placed on prn Cardizem and  transitioned to 30 gm q6 hr. switched  long acting cardizem   Pancreatic adenocarcnoma - Liver US on 08/09/13 demonstrated intrahepatic and common bile ductdilitation, cannot rule out CBD stone or mass .  - pancreatic head mass noted on 08/10/13 CT abdomen , worrisome for adenocarcinoma  - Ca19-9 elevated at 2,758   ERCP done on 1/14 with stent placed . Brushing sent to pathology shows adenocarcinoma  recommend palliative care and hospice -Outpatient palliative care consult. Now made for comfort. Ordered Ativan for agitation and sleep, condition all IV meds to by mouth. Started on Pepcid, Decadron and Zofran for nausea. oxyfast SL for dyspnea  Acute pulmonary edema  Patient became acutely hypoxic post ERCP on 1/14 with ABG showing hapercarbia  Placed on BiPAP and transitioned to facemask. Given a dose of IV lasix and IV solumedrol and transferred to ICU. CXR  showed findings of CHF. Now transitioned to [place].  Continue nebs. Echo with normal EF  HTN  -Stable    Hyperlipidemia  -d/c'd statin secondary to elevated LFT's   Diabetes type 2 with peripheral neuropathy  -hemoglobin A1c of 8.3  Since she is made full comfort , will hold further glucose monitoring .  Bullous pemphigoid  -stable   Renal insufficiency  -Avoid all nephrotoxic medication    Hypokalemia  - Likely secondary to diuretics that were recently increased  - replenished    Code Status: DNR with full comfort Family Communication: discussed with son over the phone Disposition Plan: home with hospice  HPI/Subjective:  Patient seen and examined this morning. No overnight issues. Reports feeling tired.    Objective: Filed Vitals:   08/17/13 0600  BP: 122/62  Pulse: 65  Temp: 97.5 F (36.4 C)  Resp: 20    Intake/Output Summary (Last 24 hours) at 08/17/13 1343 Last data filed at 08/17/13 7106  Gross per 24 hour  Intake    120 ml  Output   1125 ml  Net  -1005 ml   Filed Weights   08/06/13 1720 08/15/13 0400 08/16/13 0400  Weight: 110.904 kg (244 lb 8 oz) 109.7 kg (241 lb 13.5 oz) 105.1 kg (231 lb 11.3 oz)    Exam:  General: Elderly female lying bed  HEENT: no pallor, icterus, moist mucosa  Chest: diminished breath sounds at lung bases, no added sounds  Cardiovascular: S1&S2 irregularly irregular  Abd: soft, NT, ND, BS+foley in place  EXT: warm, no  edema, jaundiced  CNS: AAOX2, hard of hearing  Data Reviewed: Basic Metabolic Panel:  Recent Labs Lab 08/12/13 0525  08/13/13 0315  08/13/13 1619 08/14/13 0525 08/14/13 1531 08/14/13 1655 08/15/13 0400 08/16/13 0345  NA 138  < > 131*  < > 134* 139 141 136* 137 136*  K 3.0*  < > 3.5*  < > 3.5* 3.5* 3.6* 3.9 3.9 4.3  CL 93*  < > 87*  < > 92* 96  --  99 95* 92*  CO2 29  < > 22  < > 24 25  --  24 27 28   GLUCOSE 170*  < > 638*  < > 181* 167*  --  128* 168* 349*  BUN 21  < > 32*  < > 32* 31*  --   32* 34* 41*  CREATININE 1.43*  < > 1.60*  < > 1.57* 1.56*  --  1.25* 1.24* 1.35*  CALCIUM 8.2*  < > 7.8*  < > 8.7 9.1  --  8.4 8.8 8.6  MG 1.2*  --  1.4*  --  2.6* 2.4  --   --  2.0  --   < > = values in this interval not displayed. Liver Function Tests:  Recent Labs Lab 08/12/13 0525 08/13/13 0315 08/14/13 0525 08/15/13 0400 08/16/13 0345  AST 71* 66* 103* 97* 49*  ALT 44* 41* 52* 57* 46*  ALKPHOS 375* 340* 347* 340* 313*  BILITOT 14.2* 16.7* 19.0* 19.7* 16.0*  PROT 7.3 7.0 7.2 6.9 6.7  ALBUMIN 2.6* 2.6* 2.6* 2.5* 2.3*   No results found for this basename: LIPASE, AMYLASE,  in the last 168 hours No results found for this basename: AMMONIA,  in the last 168 hours CBC:  Recent Labs Lab 08/12/13 0525 08/13/13 0315 08/14/13 0525 08/14/13 1531 08/15/13 0640 08/16/13 0345  WBC 8.2 7.6 19.3*  --  15.1* 12.8*  NEUTROABS 4.7 7.0 17.3*  --  13.2* 10.4*  HGB 12.7 11.3* 11.7* 13.9 11.3* 11.2*  HCT 37.8 32.6* 34.9* 41.0 34.9* 33.9*  MCV 90.4 88.8 90.2  --  92.1 90.6  PLT 357 350 353  --  315 352   Cardiac Enzymes: No results found for this basename: CKTOTAL, CKMB, CKMBINDEX, TROPONINI,  in the last 168 hours BNP (last 3 results)  Recent Labs  03/07/13 1820  PROBNP 7836.0*   CBG:  Recent Labs Lab 08/15/13 1626 08/15/13 2131 08/16/13 0749 08/16/13 1126 08/16/13 1652  GLUCAP 335* 292* 307* 225* 278*    No results found for this or any previous visit (from the past 240 hour(s)).   Studies: No results found.  Scheduled Meds: . dexamethasone  2 mg Intravenous BID WC  . diltiazem  120 mg Oral Daily  . fluconazole  100 mg Oral Daily  . levothyroxine  150 mcg Oral QAC breakfast  . magic mouthwash  10 mL Oral TID  . ondansetron  8 mg Oral Q12H  . pantoprazole  40 mg Oral Daily  . potassium chloride SA  40 mEq Oral q morning - 10a   Continuous Infusions: . sodium chloride 20 mL/hr at 08/11/13 1935      Time spent: 25 minutes    Louellen Molder  [facility] Pager [phone]. If 7PM-7AM, please contact night-coverage at [email or web address], password [facility] 08/17/2013, 1:43 PM  LOS: 11 days

## 2013-08-17 NOTE — Evaluation (Signed)
Physical Therapy Evaluation Patient Details Name: [name] MRN: [id number] DOB: 1922/09/29 Today's Date: 08/17/2013 Time: 8250-0370 PT Time Calculation (min): 20 min  PT Assessment / Plan / Recommendation History of Present Illness  Supratherapeutic INR, 80 yo with newly diagnosed with Pancreatic Cancer. Painless jaundice, s/p bili stent with post-procedure complications including pulmonary edema and Afib with RVR. Multiple co-morbidities, poor functional status with many falls at home and poor prognosis.   Clinical Impression  Pt has been OOB to chair for several hours. Pt will benefit from PT to address problems.     PT Assessment  Patient needs continued PT services    Follow Up Recommendations  Home health PT;Supervision/Assistance - 24 hour    Does the patient have the potential to tolerate intense rehabilitation      Barriers to Discharge        Equipment Recommendations  None recommended by PT    Recommendations for Other Services     Frequency Min 3X/week    Precautions / Restrictions Precautions Precautions: Fall   Pertinent Vitals/Pain No c/o      Mobility  Bed Mobility Overal bed mobility: Needs Assistance Bed Mobility: Sit to Supine Sit to supine: Min assist General bed mobility comments: pt able to lift her legs onto bed. Transfers Overall transfer level: Needs assistance Equipment used: Rolling walker (2 wheeled) Transfers: Sit to/from [facility] Sit to Stand: Mod assist Stand pivot transfers: Mod assist General transfer comment: pt requested to hold her hand to stand up.then used RW to pivot.    Exercises     PT Diagnosis: Difficulty walking;Generalized weakness  PT Problem List: Decreased strength;Decreased activity tolerance;Decreased balance;Decreased mobility;Decreased knowledge of use of DME PT Treatment Interventions: Gait training;Functional mobility training;Therapeutic activities;Therapeutic  exercise;Patient/family education;DME instruction     PT Goals(Current goals can be found in the care plan section) Acute Rehab PT Goals Patient Stated Goal: not syated. PT Goal Formulation: With patient/family Time For Goal Achievement: 08/31/13 Potential to Achieve Goals: Fair  Visit Information  Last PT Received On: 08/17/13 Assistance Needed: +1 History of Present Illness: Supratherapeutic INR, 80 yo with newly diagnosed with Pancreatic Cancer. Painless jaundice, s/p bili stent with post-procedure complications including pulmonary edema and Afib with RVR. Multiple co-morbidities, poor functional status with many falls at home and poor prognosis.        Prior Standard expects to be discharged to:: Private residence Living Arrangements: Spouse/significant other Available Help at Discharge: Family;Available 24 hours/day Type of Home: House Home Access: Ramped entrance Home Layout: One level Prior Function Level of Independence: Needs assistance    Cognition  Cognition Arousal/Alertness: Awake/alert Behavior During Therapy: WFL for tasks assessed/performed Overall Cognitive Status: Within Functional Limits for tasks assessed    Extremity/Trunk Assessment Upper Extremity Assessment Upper Extremity Assessment: Generalized weakness Lower Extremity Assessment Lower Extremity Assessment: Generalized weakness Cervical / Trunk Assessment Cervical / Trunk Assessment: Normal   Balance    End of Session PT - End of Session Activity Tolerance: Patient tolerated treatment well Patient left: in bed;with call bell/phone within reach;with bed alarm set;with family/visitor present Nurse Communication: Mobility status  GP     Claretha Cooper 08/17/2013, 4:30 PM Tresa Endo PT [phone]

## 2013-08-18 MED ORDER — DEXAMETHASONE 2 MG PO TABS
2.0000 mg | ORAL_TABLET | Freq: Two times a day (BID) | ORAL | Status: DC
  Administered 2013-08-18 – 2013-08-19 (×2): 2 mg via ORAL
  Filled 2013-08-18 (×3): qty 1

## 2013-08-18 NOTE — Progress Notes (Signed)
[facility] PROGRESS NOTE  [name] NLZ:[id number] DOB: 02/15/1923 DOA: 08/06/2013 PCP: Loura Pardon, MD  Brief narrative  80 yo with a hx of afib on coumadin who initially presented with coumadin coagulopathy. Work up revealed elevated LFT's Further work up demonstrated a pancreatic head mass. GI was consulted with plans for ERCP. Unfortunately, the patient was noted to have wheezing and cxr findings of pulm edema. Steroids were started which resulted in markedly elevated glucose, now on insulin gtt. Lasix was stopped on 08/13/13 secondary to receiving IVF per insulin gtt protcol. ERCP now delayed tentatively for 08/14/13. INR has since been reversed with vitamin K and multiple units of FFP per GI.   Assessment/Plan:  A-Fib on chronic anticoagulation with supratherapeutic INR  - Presented with INR of over 15 s/p 2.5 mg vitamin K, now trending down off coumadin  - No signs of active bleeding  -d/c coumadin as pt is now full comfort   Afib with RVR  patient went in to rapid afib in PACU on 1/14 . Placed on prn Cardizem and transitioned to 30 gm q6 hr.  switched long acting cardizem   Pancreatic adenocarcnoma  - Liver US on 08/09/13 demonstrated intrahepatic and common bile ductdilitation, cannot rule out CBD stone or mass .  - pancreatic head mass noted on 08/10/13 CT abdomen , worrisome for adenocarcinoma  - Ca19-9 elevated at 2,758  ERCP done on 1/14 with stent placed . Brushing sent to pathology shows adenocarcinoma  recommend palliative care and hospice  -palliative care consulted. Now made for comfort. Ordered Ativan for agitation and sleep, condition all IV meds to by mouth. Started on Pepcid, Decadron and Zofran for nausea. oxyfast SL for dyspnea   Acute pulmonary edema  Patient became acutely hypoxic post ERCP on 1/14 with ABG showing hapercarbia  Placed on BiPAP and transitioned to facemask. Given a dose of IV lasix and IV solumedrol and transferred to ICU. CXR showed  findings of CHF. Now transitioned to [place].  Continue nebs.  Echo with normal EF   HTN  -Stable   Hyperlipidemia  -d/c'd statin secondary to elevated LFT's   Diabetes type 2 with peripheral neuropathy  -hemoglobin A1c of 8.3  Since she is made full comfort , will hold further glucose monitoring  .  Bullous pemphigoid  -stable   Renal insufficiency  -Avoid all nephrotoxic medication   Hypokalemia  - Likely secondary to diuretics that were recently increased  - replenished   Code Status: DNR with full comfort  Family Communication: discussed with son at bedside Disposition Plan: home with hospice likely tomorrow   HPI/Subjective:  Patient seen and examined this morning.  Reports feeling tired more tired.      Objective: Filed Vitals:   08/18/13 1006  BP: 122/55  Pulse: 94  Temp:   Resp:     Intake/Output Summary (Last 24 hours) at 08/18/13 1413 Last data filed at 08/17/13 2238  Gross per 24 hour  Intake      0 ml  Output   1000 ml  Net  -1000 ml   Filed Weights   08/06/13 1720 08/15/13 0400 08/16/13 0400  Weight: 110.904 kg (244 lb 8 oz) 109.7 kg (241 lb 13.5 oz) 105.1 kg (231 lb 11.3 oz)    Exam: General: Elderly female lying bed  HEENT: no pallor, icterus, moist mucosa  Chest: diminished breath sounds at lung bases, no added sounds  Cardiovascular: S1&S2 irregularly irregular  Abd: soft, NT, ND, BS+foley in place  EXT: warm, no edema, jaundiced  CNS: AAOX2, hard of hearing   Data Reviewed: Basic Metabolic Panel:  Recent Labs Lab 08/12/13 0525  08/13/13 0315  08/13/13 1619 08/14/13 0525 08/14/13 1531 08/14/13 1655 08/15/13 0400 08/16/13 0345  NA 138  < > 131*  < > 134* 139 141 136* 137 136*  K 3.0*  < > 3.5*  < > 3.5* 3.5* 3.6* 3.9 3.9 4.3  CL 93*  < > 87*  < > 92* 96  --  99 95* 92*  CO2 29  < > 22  < > 24 25  --  24 27 28   GLUCOSE 170*  < > 638*  < > 181* 167*  --  128* 168* 349*  BUN 21  < > 32*  < > 32* 31*  --  32* 34* 41*   CREATININE 1.43*  < > 1.60*  < > 1.57* 1.56*  --  1.25* 1.24* 1.35*  CALCIUM 8.2*  < > 7.8*  < > 8.7 9.1  --  8.4 8.8 8.6  MG 1.2*  --  1.4*  --  2.6* 2.4  --   --  2.0  --   < > = values in this interval not displayed. Liver Function Tests:  Recent Labs Lab 08/12/13 0525 08/13/13 0315 08/14/13 0525 08/15/13 0400 08/16/13 0345  AST 71* 66* 103* 97* 49*  ALT 44* 41* 52* 57* 46*  ALKPHOS 375* 340* 347* 340* 313*  BILITOT 14.2* 16.7* 19.0* 19.7* 16.0*  PROT 7.3 7.0 7.2 6.9 6.7  ALBUMIN 2.6* 2.6* 2.6* 2.5* 2.3*   No results found for this basename: LIPASE, AMYLASE,  in the last 168 hours No results found for this basename: AMMONIA,  in the last 168 hours CBC:  Recent Labs Lab 08/12/13 0525 08/13/13 0315 08/14/13 0525 08/14/13 1531 08/15/13 0640 08/16/13 0345  WBC 8.2 7.6 19.3*  --  15.1* 12.8*  NEUTROABS 4.7 7.0 17.3*  --  13.2* 10.4*  HGB 12.7 11.3* 11.7* 13.9 11.3* 11.2*  HCT 37.8 32.6* 34.9* 41.0 34.9* 33.9*  MCV 90.4 88.8 90.2  --  92.1 90.6  PLT 357 350 353  --  315 352   Cardiac Enzymes: No results found for this basename: CKTOTAL, CKMB, CKMBINDEX, TROPONINI,  in the last 168 hours BNP (last 3 results)  Recent Labs  03/07/13 1820  PROBNP 7836.0*   CBG:  Recent Labs Lab 08/15/13 1626 08/15/13 2131 08/16/13 0749 08/16/13 1126 08/16/13 1652  GLUCAP 335* 292* 307* 225* 278*    No results found for this or any previous visit (from the past 240 hour(s)).   Studies: No results found.  Scheduled Meds: . dexamethasone  2 mg Intravenous BID WC  . diltiazem  120 mg Oral Daily  . fluconazole  100 mg Oral Daily  . levothyroxine  150 mcg Oral QAC breakfast  . magic mouthwash  10 mL Oral TID  . ondansetron  8 mg Oral Q12H  . pantoprazole  40 mg Oral Daily  . potassium chloride SA  40 mEq Oral q morning - 10a   Continuous Infusions: . sodium chloride 20 mL/hr at 08/11/13 1935      Time spent: [place], [place]  [facility] Pager [phone] If 7PM-7AM, please contact night-coverage at [email or web address], password [facility] 08/18/2013, 2:13 PM  LOS: 12 days

## 2013-08-18 NOTE — Progress Notes (Addendum)
08/18/2013 1530 NCM spoke to pt and gave permission to speak to dtr. Provided Hospice List and dtr believes the hospice choice is [facility]. Pt is requesting hospital bed at home. Will notified [facility] in am for dc home tomorrow with Hospice. Jonnie Finner RN CCM Case Mgmt phone [phone]  contact  JULYA, ALIOTO, husband # [phone] Revonda Humphrey, son # [phone] Lynnae January, dtr # [phone]

## 2013-08-19 ENCOUNTER — Telehealth: Payer: Self-pay | Admitting: *Deleted

## 2013-08-19 DIAGNOSIS — J9600 Acute respiratory failure, unspecified whether with hypoxia or hypercapnia: Secondary | ICD-10-CM | POA: Diagnosis not present

## 2013-08-19 DIAGNOSIS — R791 Abnormal coagulation profile: Secondary | ICD-10-CM | POA: Diagnosis not present

## 2013-08-19 DIAGNOSIS — E119 Type 2 diabetes mellitus without complications: Secondary | ICD-10-CM | POA: Diagnosis not present

## 2013-08-19 MED ORDER — OXYCODONE HCL 20 MG/ML PO CONC
5.0000 mg | ORAL | Status: AC | PRN
Start: 2013-08-19 — End: ?

## 2013-08-19 MED ORDER — DEXAMETHASONE 2 MG PO TABS: 2.0000 mg | ORAL_TABLET | Freq: Two times a day (BID) | ORAL | Status: AC

## 2013-08-19 MED ORDER — DIPHENHYDRAMINE HCL 25 MG PO CAPS: 25.0000 mg | ORAL_CAPSULE | Freq: Four times a day (QID) | ORAL | Status: AC | PRN

## 2013-08-19 MED ORDER — MAGIC MOUTHWASH: 10.0000 mL | Freq: Three times a day (TID) | ORAL | Status: AC

## 2013-08-19 MED ORDER — FLUCONAZOLE 100 MG PO TABS: 100.0000 mg | ORAL_TABLET | Freq: Every day | ORAL | Status: AC

## 2013-08-19 NOTE — Care Management Note (Signed)
Cm spoke with patient and son at the bedside concerning disposition home with hospice. Per family choice on 08/18/13 [facility] to provide home hospice services at discharge. Cm notified [place] liaison Danton Sewer. Demographics, face sheet, and MD orders faxed to [facility] at [phone].  Per pt's son Elta Guadeloupe, pt will require non emergent ambulance upon discharge. Pt family request hospital bed.    Venita Lick Annabeth Tortora,MSN,RN [phone]

## 2013-08-19 NOTE — Progress Notes (Signed)
Notified by [facility], patient and family request services of [facility]) after discharge. Patient information reviewed with Dr Alferd Patee, [place] Director hospice eligible with dx: pancreatic cancer .  Spoke with son Elta Guadeloupe initially away form bedside and later with son Elta Guadeloupe and patient to initiate education related to hospice services, philosophy and team approach to care -pt and son voiced good understanding of information priovided. Per notes plan is to d/c by non-emergent transport later today once DME arrangements are in place; pt /son request Foley catheter be left in place for comfort at discharge.  DME has been requested for delivery to the home later today: Complete Pkg D: fully electric hospital bed with AP&P mattress, full rails and over-bed table - please contact son Elta Guadeloupe [phone] to arrange for delivery Initial paperwork faxed to [place]  Completed d/c summary will need to be faxed to [place] @ [phone] when final Please notify [facility] when patient is ready to leave unit at d/c call [phone] (or [phone] if after 5 pm);  [facility] information and contact numbers also given to son Revonda Humphrey during visit.   Above information shared with [facility] [place] Please call with any questions or concerns   Danton Sewer, RN 08/19/2013, 11:31 AM [facility] Liaison [phone]

## 2013-08-19 NOTE — Telephone Encounter (Signed)
Yes- thanks and I would appreciate the help from their doctors as well - hope she is feeling ok

## 2013-08-19 NOTE — Progress Notes (Signed)
[facility]  Patient Status: [facility]  [facility] is providing the following services: Hospice DME Pkg D  If patient discharges after hours, please call [phone].   Linward Headland 08/19/2013, 10:30 AM

## 2013-08-19 NOTE — Progress Notes (Signed)
CSW received notification that pt discharging home with hospice and needing ambulance transport home.  CSW met with pt son at bedside and confirmed address and confirmed that equipment had arrived to pt home.  CSW arranged ambulance transport via [facility] to pt home.  No further social work needs identified at this time.  CSW signing off.   Drake Leach, MSW, [facility] [phone]

## 2013-08-19 NOTE — Discharge Summary (Signed)
Physician Discharge Summary  [name] [facility] TGG:[id number] DOB: [birth year] DOA: 08/06/2013  PCP: Loura Pardon, MD  Admit date: 08/06/2013 Discharge date: 08/19/2013  Time spent: 40 minutes  Recommendations for Outpatient Follow-up:  D/C home with home hospice   Discharge Diagnoses:   Principal Problem:   Pancreatic adenocarcinoma  Active Problems:   Acute respiratory failure with hypercapnia   HYPERLIPIDEMIA   HYPERTENSION   Atrial fibrillation   Irritable bowel syndrome   Rosacea   DM type 2 with diabetic peripheral neuropathy   Anemia   History of cerebrovascular disease   Supratherapeutic INR   Pulmonary hypertension   Nonspecific (abnormal) findings on radiological and other examination of biliary tract   Obstruction of bile duct   Advanced directives, counseling/discussion   Discharge Condition: guarded  Diet recommendation: regular/ comfort feeds  Filed Weights   08/06/13 1720 08/15/13 0400 08/16/13 0400  Weight: 110.904 kg (244 lb 8 oz) 109.7 kg (241 lb 13.5 oz) 105.1 kg (231 lb 11.3 oz)    History of present illness:  Please refer to admission H&P for details, but in brief, 80 yo with a hx of afib on coumadin who initially presented with coumadin coagulopathy. Work up revealed elevated LFT's Further work up demonstrated a pancreatic head mass. GI was consulted with plans for ERCP. Unfortunately, the patient was noted to have wheezing and cxr findings of pulm edema. Steroids were started which resulted in markedly elevated glucose, now on insulin gtt. Lasix was stopped on 08/13/13 secondary to receiving IVF per insulin gtt protcol. ERCP now delayed tentatively for 08/14/13. INR has since been reversed with vitamin K and multiple units of FFP per GI.    Hospital Course:  A-Fib on chronic anticoagulation with supratherapeutic INR  - Presented with INR of over 15 s/p 2.5 mg vitamin K, now trending down off coumadin  - No signs of active bleeding  -d/c coumadin as pt  is now full comfort   Afib with RVR  patient went in to rapid afib in PACU on 1/14 . Placed on prn Cardizem and transitioned to 30 gm q6 hr.  switched long acting cardizem   Pancreatic adenocarcnoma  - Liver US on 08/09/13 demonstrated intrahepatic and common bile ductdilitation, cannot rule out CBD stone or mass .  - pancreatic head mass noted on 08/10/13 CT abdomen , worrisome for adenocarcinoma  - Ca19-9 elevated at 2,758  ERCP done on 1/14 with stent placed . Brushing sent to pathology shows adenocarcinoma  recommend palliative care and hospice  -palliative care consulted. Now made for comfort. Ordered Ativan for agitation and sleep, condition all IV meds to by mouth. Started on Pepcid, Decadron and Zofran for nausea. oxyfast SL for dyspnea.   Acute pulmonary edema  Patient became acutely hypoxic post ERCP on 1/14 with ABG showing hapercarbia  Placed on BiPAP and transitioned to facemask. Given a dose of IV lasix and IV solumedrol and transferred to ICU. CXR showed findings of CHF. Now transitioned to [place].  Continue nebs.  Echo with normal EF   HTN  -Stable .   Hyperlipidemia  -d/c'd statin secondary to elevated LFT's   Diabetes type 2 with peripheral neuropathy  -hemoglobin A1c of 8.3  Since she is made full comfort ,  hold further glucose monitoring.   .  Bullous pemphigoid   Renal insufficiency   Hypokalemia  - Likely secondary to diuretics that were recently increased  - replenished   Code Status: DNR with full comfort  Family Communication:  discussed with son at bedside  Disposition Plan: home with hospice     Discharge Exam: Filed Vitals:   08/19/13 1300  BP: 151/72  Pulse: 90  Temp: 98.4 F (36.9 C)  Resp: 18    General: Elderly female lying bed in NAD HEENT: no pallor, icterus, moist mucosa  Chest: diminished breath sounds at lung bases, no added sounds  Cardiovascular: S1&S2 irregularly irregular  Abd: soft, NT, ND, BS+foley in place  EXT: warm,  no edema, jaundiced  CNS: AAOX2, hard of hearing   Discharge Instructions   Future Appointments Provider Department Dept Phone   08/21/2013 12:15 PM Philemon Kingdom, MD [facility] [place] [phone]   11/01/2013 2:30 PM Kathrynn Ducking, MD [facility] [phone]       Medication List    STOP taking these medications       BD PEN NEEDLE NANO U/F 32G X 4 MM Misc  Generic drug:  Insulin Pen Needle     cefUROXime 250 MG tablet  Commonly known as:  CEFTIN     furosemide 40 MG tablet  Commonly known as:  LASIX     HUMULIN 70/30 [place]     Insulin Syringe-Needle U-100 31G X 15/64" 1 ML Misc  Commonly known as:  BD INSULIN SYRINGE ULTRAFINE     methotrexate 2.5 MG tablet            potassium chloride SA 20 MEQ tablet  Commonly known as:  K-DUR,KLOR-CON     silver sulfADIAZINE 1 % cream  Commonly known as:  SILVADENE     simvastatin 40 MG tablet  Commonly known as:  ZOCOR     traMADol 50 MG tablet  Commonly known as:  ULTRAM     warfarin 5 MG tablet  Commonly known as:  COUMADIN      TAKE these medications       acetaminophen 500 MG tablet  Commonly known as:  TYLENOL  Take 500 mg by mouth every 6 (six) hours as needed for mild pain.     ANTI-DIARRHEAL 2 MG capsule  Generic drug:  loperamide  Take 2-4 mg by mouth 4 (four) times daily as needed for diarrhea or loose stools.     dexamethasone 2 MG tablet  Commonly known as:  DECADRON  Take 1 tablet (2 mg total) by mouth every 12 (twelve) hours.     diphenhydrAMINE 25 mg capsule  Commonly known as:  BENADRYL  Take 1 capsule (25 mg total) by mouth every 6 (six) hours as needed for itching.     fluconazole 100 MG tablet  Commonly known as:  DIFLUCAN  Take 1 tablet (100 mg total) by mouth daily.     fluocinonide cream 0.05 %  Commonly known as:  LIDEX  Apply 1 application topically 2 (two) times daily as needed (For sores on body.).     glucose blood test strip  Commonly  known as:  ONE TOUCH ULTRA TEST  Check sugars 2x a day     hydrocortisone cream 1 %  Apply 1 application topically 2 (two) times daily as needed for itching.     hydrOXYzine 10 MG tablet  Commonly known as:  ATARAX/VISTARIL  Take 10-30 mg by mouth at bedtime as needed for itching.     levothyroxine 150 MCG tablet  Commonly known as:  SYNTHROID, LEVOTHROID  Take 150 mcg by mouth daily before breakfast.     magic mouthwash Soln  Take 10 mLs by  mouth 3 (three) times daily.     metoprolol 50 MG tablet  Commonly known as:  LOPRESSOR  Take 25 mg by mouth 2 (two) times daily.     oxyCODONE 100 MG/5ML concentrated solution  Commonly known as:  ROXICODONE INTENSOL  Take 0.3-0.5 mLs (6-10 mg total) by mouth every 4 (four) hours as needed for moderate pain, severe pain or breakthrough pain (Dyspnea).         omeprazole 20 MG capsule  Commonly known as:  PRILOSEC     Allergies  Allergen Reactions  . Ciprofloxacin     Nausea Possibly rash and itching- but not sure   . Gabapentin Nausea And Vomiting  . Insulin Glargine     Itching   . Latex     Rash   . Metformin And Related     Severe diarrhea   . Metoclopramide Hcl Nausea And Vomiting  . Nsaids Nausea And Vomiting  . Promethazine Hcl Other (See Comments)    Reaction unknown       Follow-up Information   Follow up with [facility] (Pls notifty whn pt ready to leave unit at d/c call [phone] (or if aftr 5 pm [phone]))    Contact information:   [facility] [place] [id number]       The results of significant diagnostics from this hospitalization (including imaging, microbiology, ancillary and laboratory) are listed below for reference.    Significant Diagnostic Studies: Ct Abdomen Pelvis W Wo Contrast  08/10/2013   CLINICAL DATA:  Obstructive jaundice, suspected pancreatic mass on MRCP  EXAM: CT ABDOMEN AND PELVIS WITHOUT AND WITH CONTRAST  TECHNIQUE:  Multidetector CT imaging of the abdomen and pelvis was performed without contrast material in one or both body regions, followed by contrast material(s) and further sections in one or both body regions.  CONTRAST:  113mL OMNIPAQUE IOHEXOL 300 MG/ML  SOLN  COMPARISON:  MRI/MRCP dated 08/09/2013  FINDINGS: Scattered in nodularity in the visualized bilateral lungs, measuring up to 9 mm in the lingula (series 9/image 1) and 6 mm in the right lower lobe (series 9/image 10).  Cardiomegaly.  Liver is notable for moderate intrahepatic ductal dilatation.  Spleen and adrenal glands are within normal limits.  2.5 x 3.1 x 2.2 cm hypoenhancing mass in the pancreatic head (series 3/image 54), highly suspicious for pancreatic neoplasm such as adenocarcinoma. Associated atrophy of the pancreatic body/tail with dilatation of the main pancreatic duct measuring up to 7 mm.  Mass does not definitely involve the celiac artery and SMA. However, the portosplenic confluence and SMV is involved, with collateralization/distal reconstitution of the portal vein (series 5/image 29). The mass may tether/involve the distal gastric antrum/duodenal bulb (series 5/image 34).  Status post cholecystectomy. Dilated common duct, measuring up to 2.0 cm (series 5/image 30), with abrupt cut off at the level of the pancreatic mass.  Malrotated right kidney. Left kidney is within normal limits. No renal calculi or hydronephrosis.  No evidence of bowel obstruction.  Prior appendectomy.  Atherosclerotic calcifications of the abdominal aorta and branch vessels.  No abdominopelvic ascites.  10 mm short axis portacaval node (series 3/image 46), at the upper limits of normal.  Status post hysterectomy.  Bilateral ovaries are unremarkable.  Bladder is within normal limits.  Degenerative changes of the visualized thoracolumbar spine. Grade 1 spondylolisthesis at L5-S1.  IMPRESSION: 3.1 cm hypoenhancing mass in the pancreatic head, as described above, highly  suspicious for pancreatic neoplasm such as adenocarcinoma.  Mass may tether/involve the distal gastric antrum/duodenal bulb. Mass involves the portosplenic confluence and SMV. Collateralization/distal reconstitution of the portal vein.  Secondary intrahepatic/extrahepatic ductal dilatation. Common duct measures up to 2.0 cm and abruptly tapers at the level of the mass.  Scattered pulmonary nodules in the visualized lungs, measuring up to 9 mm. Metastatic disease is not excluded.   Electronically Signed   By: Julian Hy M.D.   On: 08/10/2013 21:36   Mr Abdomen Mrcp Wo Cm  08/10/2013   CLINICAL DATA:  Biliary obstruction, elevated LFTs.  EXAM: MRI ABDOMEN WITHOUT  (INCLUDING MRCP)  TECHNIQUE: Multiplanar multisequence MR imaging of the abdomen was performed. Heavily T2-weighted images of the biliary and pancreatic ducts were obtained, and three-dimensional MRCP images were rendered by post processing.  COMPARISON:  Abdominal ultrasound dated 08/09/2013  FINDINGS: Patient was claustrophobic and had been given Ativan, but was uncooperative throughout the study, with significant motion degradation. Study was subsequently aborted and best images were provided for interpretation.  Axial T2 imaging is severely motion degraded and essentially nondiagnostic.  Coronal MRCP imaging demonstrates dilatation of the proximal common duct up to 1.8 cm (series 4/ image 38). Abrupt narrowing/stricture of the mid common duct with shouldering (series 4/image 34). Notably, the distal common duct appears relatively normal, measuring 5-6 mm and is smoothly tapering at the ampulla.  Associated mild to moderate intrahepatic ductal dilatation.  Main pancreatic duct is mildly dilated, measuring 5-6 mm (series 4/image 65), and also abruptly tapers in the region of the pancreatic head/uncinate process.  Notably, the intrahepatic ductal dilatation is new from prior CT chest dated 06/16/2012.  Although not directly visualized, these  findings are worrisome for an obstructing mass at the level of the pancreatic head/uncinate process.  IMPRESSION: Severely motion degraded imaging. Study was aborted prematurely due to difficulty with patient cooperation.  Intrahepatic/extrahepatic ductal dilatation with abrupt narrowing/stricture of the mid common duct. Mild dilatation of the pancreatic duct with abrupt tapering in the region of the pancreatic head/uncinate process.  Although not directly visualized, these findings are worrisome for an obstructing mass at the level of the pancreatic head/uncinate process. Given difficulty with patient cooperation, consider CT abdomen with/without contrast (pancreatic protocol) for further evaluation.  These results were called by telephone at the time of interpretation on 08/10/2013 at 1:20 PM to Dr. Oretha Caprice, who verbally acknowledged these results.   Electronically Signed   By: Julian Hy M.D.   On: 08/10/2013 14:05   Mr 3d Recon At Scanner  08/10/2013   CLINICAL DATA:  Biliary obstruction, elevated LFTs.  EXAM: MRI ABDOMEN WITHOUT  (INCLUDING MRCP)  TECHNIQUE: Multiplanar multisequence MR imaging of the abdomen was performed. Heavily T2-weighted images of the biliary and pancreatic ducts were obtained, and three-dimensional MRCP images were rendered by post processing.  COMPARISON:  Abdominal ultrasound dated 08/09/2013  FINDINGS: Patient was claustrophobic and had been given Ativan, but was uncooperative throughout the study, with significant motion degradation. Study was subsequently aborted and best images were provided for interpretation.  Axial T2 imaging is severely motion degraded and essentially nondiagnostic.  Coronal MRCP imaging demonstrates dilatation of the proximal common duct up to 1.8 cm (series 4/ image 38). Abrupt narrowing/stricture of the mid common duct with shouldering (series 4/image 34). Notably, the distal common duct appears relatively normal, measuring 5-6 mm and is smoothly  tapering at the ampulla.  Associated mild to moderate intrahepatic ductal dilatation.  Main pancreatic duct is mildly dilated, measuring 5-6 mm (series 4/image 65),  and also abruptly tapers in the region of the pancreatic head/uncinate process.  Notably, the intrahepatic ductal dilatation is new from prior CT chest dated 06/16/2012.  Although not directly visualized, these findings are worrisome for an obstructing mass at the level of the pancreatic head/uncinate process.  IMPRESSION: Severely motion degraded imaging. Study was aborted prematurely due to difficulty with patient cooperation.  Intrahepatic/extrahepatic ductal dilatation with abrupt narrowing/stricture of the mid common duct. Mild dilatation of the pancreatic duct with abrupt tapering in the region of the pancreatic head/uncinate process.  Although not directly visualized, these findings are worrisome for an obstructing mass at the level of the pancreatic head/uncinate process. Given difficulty with patient cooperation, consider CT abdomen with/without contrast (pancreatic protocol) for further evaluation.  These results were called by telephone at the time of interpretation on 08/10/2013 at 1:20 PM to Dr. Oretha Caprice, who verbally acknowledged these results.   Electronically Signed   By: Julian Hy M.D.   On: 08/10/2013 14:05   US Abdomen Limited  08/09/2013   CLINICAL DATA:  Elevated LFTs.  EXAM: US ABDOMEN LIMITED - RIGHT UPPER QUADRANT  COMPARISON:  None.  FINDINGS: Gallbladder  Previous cholecystectomy.  Common bile duct  Diameter: 18.4 mm.  Liver:  No focal lesion identified.  Intrahepatic ductal dilatation noted.  IMPRESSION: 1. Intrahepatic and common bile duct dilatation. Cannot rule out distal common bile duct stone or mass. Consider further evaluation with MRCP.   Electronically Signed   By: Kerby Moors M.D.   On: 08/09/2013 10:49   Dg Chest Port 1 View  08/15/2013   CLINICAL DATA:  Shortness of breath, CHF, pulmonary edema,  history hypertension, atrial fibrillation, diabetes, CHF, asthma, vaginal cancer  EXAM: PORTABLE CHEST - 1 VIEW  COMPARISON:  Portable exam 0908 hr compared to 08/14/2013  FINDINGS: Enlargement of cardiac silhouette.  Calcified tortuous aorta.  Pulmonary vascular congestion.  Improved interstitial infiltrates likely improved pulmonary edema.  Minimal basilar atelectasis bilaterally.  No gross pleural effusion or pneumothorax.  IMPRESSION: Probable CHF, slightly improved.   Electronically Signed   By: Lavonia Dana M.D.   On: 08/15/2013 09:26   Dg Chest Port 1 View  08/14/2013   CLINICAL DATA:  Shortness of breath, history hypertension, diabetes, CHF  EXAM: PORTABLE CHEST - 1 VIEW  COMPARISON:  Portable exam 2703 hr compared to 08/12/2013  FINDINGS: Enlargement of cardiac silhouette with pulmonary vascular congestion.  Calcified tortuous thoracic aorta.  Scattered pulmonary infiltrates likely pulmonary edema and CHF.  No gross pleural effusion or pneumothorax.  IMPRESSION: Probable CHF.   Electronically Signed   By: Lavonia Dana M.D.   On: 08/14/2013 16:37   Dg Chest Port 1 View  08/12/2013   CLINICAL DATA:  80 year old female shortness of breath. Initial encounter.  EXAM: PORTABLE CHEST - 1 VIEW  COMPARISON:  DG CHEST 1V PORT dated 08/09/2013; DG CHEST 1V PORT dated 03/07/2013 And earlier.  FINDINGS: Portable AP upright view at 0933 hr. Lung volumes not significantly changed. Stable cardiomegaly and mediastinal contours. No pneumothorax. Stable increased interstitial markings, no overt edema. No pleural effusion or consolidation. No acute pulmonary opacity identified.  IMPRESSION: Stable. No acute cardiopulmonary abnormality.   Electronically Signed   By: Lars Pinks M.D.   On: 08/12/2013 09:42   Dg Chest Port 1 View  08/09/2013   CLINICAL DATA:  Shortness of breath  EXAM: PORTABLE CHEST - 1 VIEW  COMPARISON:  03/07/2013  FINDINGS: Low lung volumes. Cardiac silhouette is enlarged. There is  prominence of the  interstitial markings, there is prominence of the interstitial markings and mild peribronchial cuffing. No focal regions of consolidation or focal infiltrates identified. Degenerative changes appreciated within the right and left shoulders.  IMPRESSION: Pulmonary vascular congestion/mild edema. No focal regions of consolidation or focal infiltrates. The findings within the lung parenchyma are accentuated by the low lung volumes.   Electronically Signed   By: Margaree Mackintosh M.D.   On: 08/09/2013 15:55   Dg Ercp  08/15/2013   CLINICAL DATA:  Common bile duct stricture, stent placement  EXAM: ERCP performed by Dr. Lucio Edward  TECHNIQUE: Multiple spot images obtained with the fluoroscopic device and submitted for interpretation post-procedure.  COMPARISON:  MRCP 08/09/2013; pancreas protocol CT 06/10/2014  FINDINGS: A total of 14 spot images obtained during ERCP and placement of a metallic biliary stent are submitted for radiologic interpretation. The initial images demonstrate an endoscope in the descending duodenum with cannulation of the common bile duct. Retrograde opacification demonstrates intra and extrahepatic biliary ductal dilatation. The distal common bile duct is unremarkable. In the mid common bile duct there is a focal high-grade stenosis. A wire was advanced beyond the stenosis into the intrahepatic bile ducts. The final images depict deployment of a self expanding covered metallic stent.  IMPRESSION: ERCP and placement of a self expanding metallic biliary stent as above.  These images were submitted for radiologic interpretation only. Please see the procedural report for the amount of contrast and the fluoroscopy time utilized.   Electronically Signed   By: Jacqulynn Cadet M.D.   On: 08/15/2013 08:13    Microbiology: No results found for this or any previous visit (from the past 240 hour(s)).   Labs: Basic Metabolic Panel:  Recent Labs Lab 08/13/13 0315  08/13/13 1619 08/14/13 0525  08/14/13 1531 08/14/13 1655 08/15/13 0400 08/16/13 0345  NA 131*  < > 134* 139 141 136* 137 136*  K 3.5*  < > 3.5* 3.5* 3.6* 3.9 3.9 4.3  CL 87*  < > 92* 96  --  99 95* 92*  CO2 22  < > 24 25  --  24 27 28   GLUCOSE 638*  < > 181* 167*  --  128* 168* 349*  BUN 32*  < > 32* 31*  --  32* 34* 41*  CREATININE 1.60*  < > 1.57* 1.56*  --  1.25* 1.24* 1.35*  CALCIUM 7.8*  < > 8.7 9.1  --  8.4 8.8 8.6  MG 1.4*  --  2.6* 2.4  --   --  2.0  --   < > = values in this interval not displayed. Liver Function Tests:  Recent Labs Lab 08/13/13 0315 08/14/13 0525 08/15/13 0400 08/16/13 0345  AST 66* 103* 97* 49*  ALT 41* 52* 57* 46*  ALKPHOS 340* 347* 340* 313*  BILITOT 16.7* 19.0* 19.7* 16.0*  PROT 7.0 7.2 6.9 6.7  ALBUMIN 2.6* 2.6* 2.5* 2.3*   No results found for this basename: LIPASE, AMYLASE,  in the last 168 hours No results found for this basename: AMMONIA,  in the last 168 hours CBC:  Recent Labs Lab 08/13/13 0315 08/14/13 0525 08/14/13 1531 08/15/13 0640 08/16/13 0345  WBC 7.6 19.3*  --  15.1* 12.8*  NEUTROABS 7.0 17.3*  --  13.2* 10.4*  HGB 11.3* 11.7* 13.9 11.3* 11.2*  HCT 32.6* 34.9* 41.0 34.9* 33.9*  MCV 88.8 90.2  --  92.1 90.6  PLT 350 353  --  315 352  Cardiac Enzymes: No results found for this basename: CKTOTAL, CKMB, CKMBINDEX, TROPONINI,  in the last 168 hours BNP: BNP (last 3 results)  Recent Labs  03/07/13 1820  PROBNP 7836.0*   CBG:  Recent Labs Lab 08/15/13 1626 08/15/13 2131 08/16/13 0749 08/16/13 1126 08/16/13 1652  GLUCAP 335* 292* 307* 225* 278*       Signed:  Audri Kozub  [facility] 08/19/2013, 1:50 PM

## 2013-08-19 NOTE — Telephone Encounter (Signed)
Nellie advise Hospice of your comments

## 2013-08-19 NOTE — Telephone Encounter (Signed)
Hospice left voicemail, pt is being D/C from hospital today into hospice care. They want to know if you will be the attending doctor an if so would you like their doctors to help with managing pt's care, please advise

## 2013-08-20 ENCOUNTER — Ambulatory Visit: Payer: Self-pay | Admitting: Family Medicine

## 2013-08-21 ENCOUNTER — Ambulatory Visit: Payer: Medicare Other | Admitting: Internal Medicine

## 2013-08-21 DIAGNOSIS — Z0289 Encounter for other administrative examinations: Secondary | ICD-10-CM

## 2013-08-22 ENCOUNTER — Telehealth: Payer: Self-pay | Admitting: *Deleted

## 2013-08-22 MED ORDER — ONDANSETRON HCL 4 MG PO TABS: 4.0000 mg | ORAL_TABLET | Freq: Three times a day (TID) | ORAL | Status: AC | PRN

## 2013-08-22 MED ORDER — BISACODYL 10 MG RE SUPP: 10.0000 mg | RECTAL | Status: AC | PRN

## 2013-08-22 NOTE — Telephone Encounter (Signed)
Arbie Cookey notified Rxs sent to pharmacy

## 2013-08-22 NOTE — Telephone Encounter (Signed)
[name] with Hospice left me a voicemail.  Pt has been complaining of nausea, and it's worse in the am, hospice is requesting you send in a medication for nausea.  Also pt has been constipated for a few days and today was the 1st BM in a few days. Since pt is prone to getting constipated Hosp[facility] is requesting a dulcolax suppository to use prn.  Pt is still using [facility], and hospice would like you send in the meds. She request call back once they have been sent, please advise

## 2013-08-22 NOTE — Telephone Encounter (Signed)
I sent them to her pharmacy Hope this helps

## 2013-08-23 ENCOUNTER — Telehealth: Payer: Self-pay | Admitting: Family Medicine

## 2013-08-23 NOTE — Telephone Encounter (Signed)
Called to check in on pt to see if her nausea medicine is working - husband said she is more comfortable and sleeping a lot lately  Will ask about her nausea when she wakes up and keep me and hospice posted with updates  He is pleased with hospice and does not feel overwhelmed -which is good  Will continue to follow

## 2013-08-26 ENCOUNTER — Telehealth: Payer: Self-pay | Admitting: Family Medicine

## 2013-08-26 NOTE — Telephone Encounter (Signed)
Carolyn with [facility] to report that pt expired on 09-14-2013 at 10:58am.  Cb [phone] with any questions.

## 2013-09-01 DEATH — deceased

## 2013-11-01 ENCOUNTER — Ambulatory Visit: Payer: Medicare Other | Admitting: Neurology

## 2014-09-08 IMAGING — CT CT HEAD W/O CM
2 series · 16 of 30 positions shown, 18 images · non-contrast
Comparison: none

[Series 3: head bone · axial · 0.49mm/px · z∈[+15,+127]mm · 8 of 56 slices shown]
[im 6/56  bone]
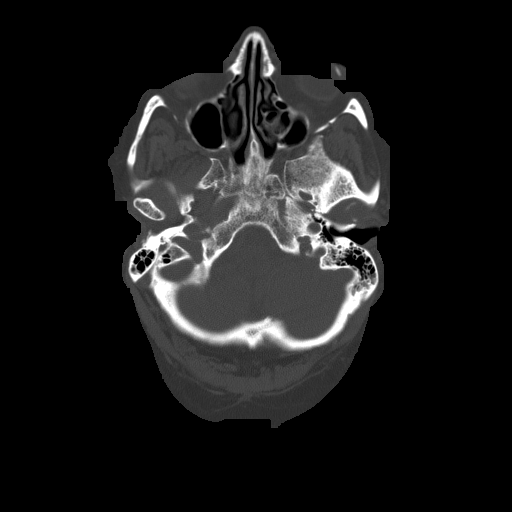
[im 12/56  bone]
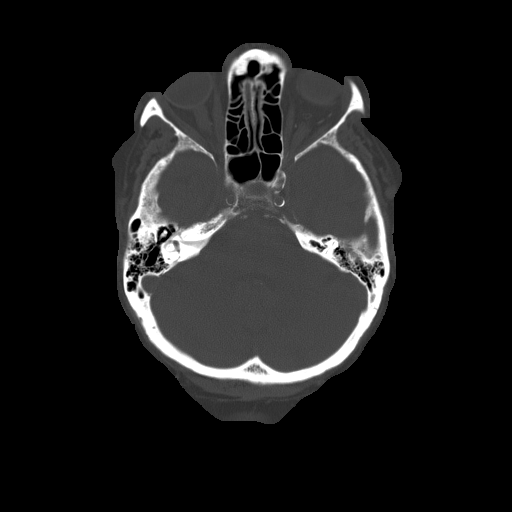
[im 18/56  bone]
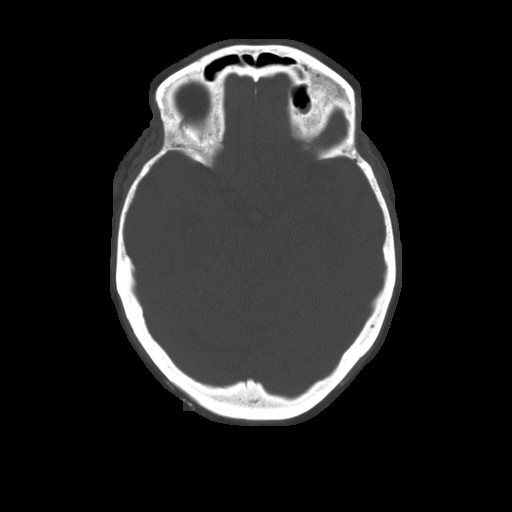
[im 24/56  bone]
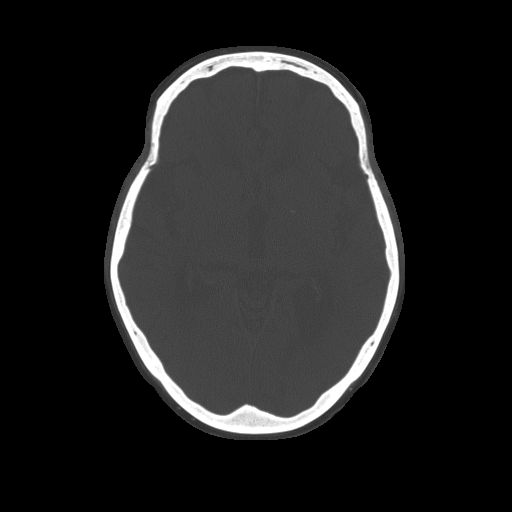
[im 32/56  bone]
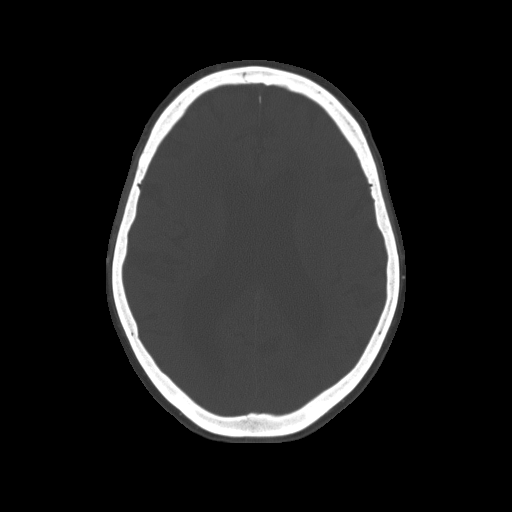
[im 38/56  bone]
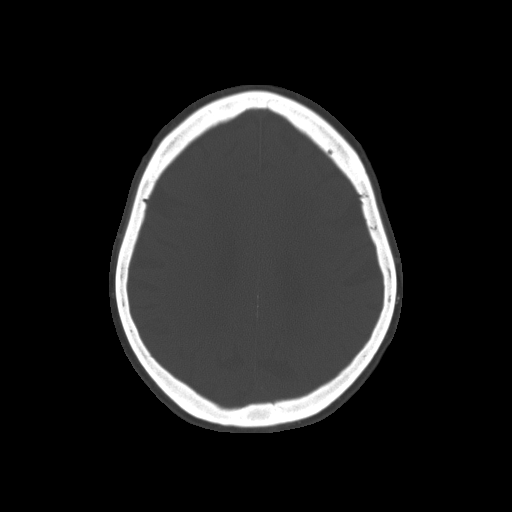
[im 44/56  bone]
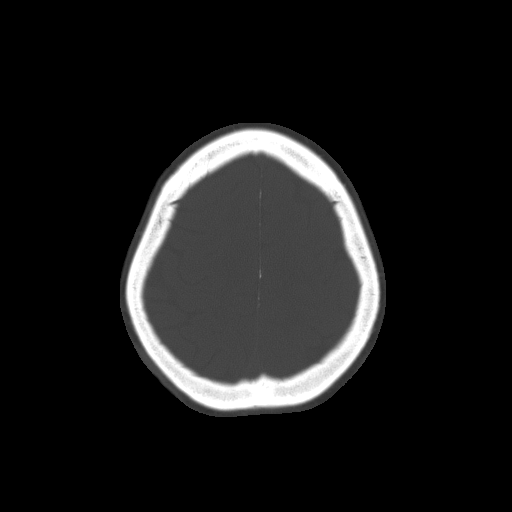
[im 50/56  bone]
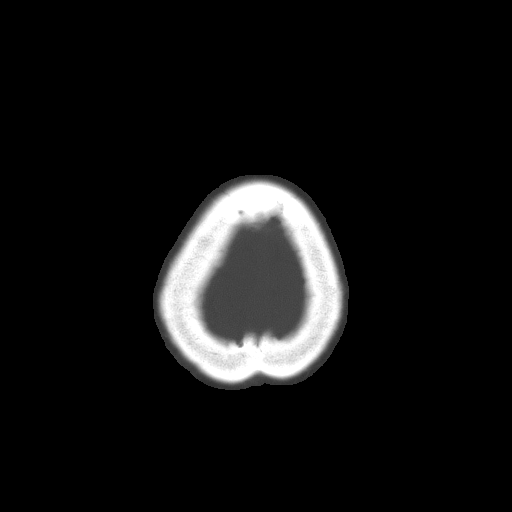

[Series 32: 3d filtered head w/o · axial · non-contrast · 0.49mm/px · z∈[+18,+126]mm · 8 of 28 slices shown, 10 images]
[im 4/28  brain]
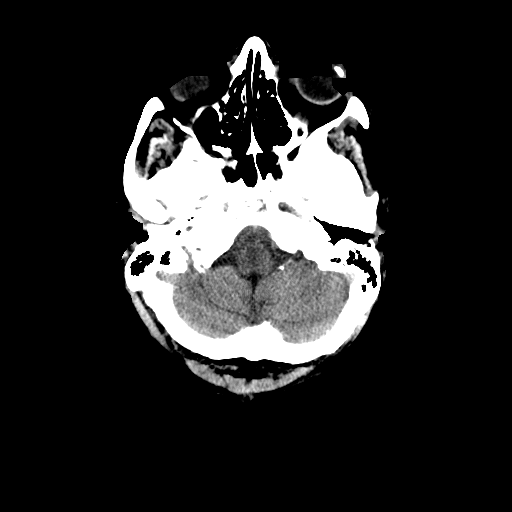
[im 4/28  bone]
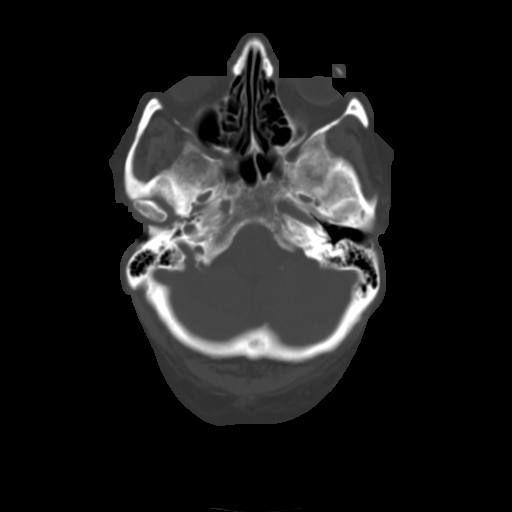
[im 7/28  brain]
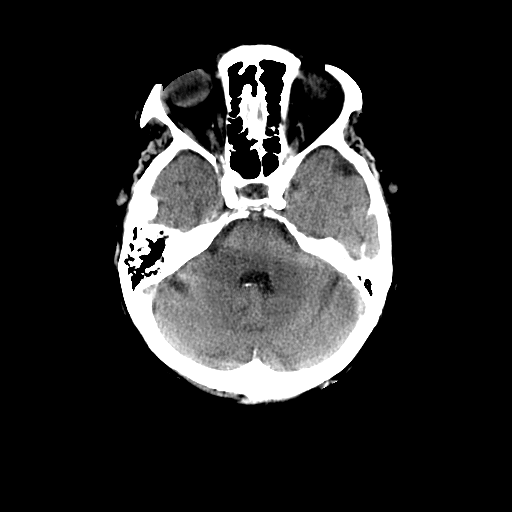
[im 10/28  brain]
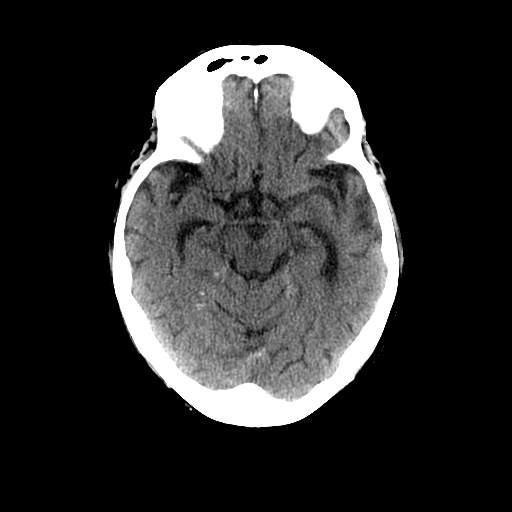
[im 13/28  brain]
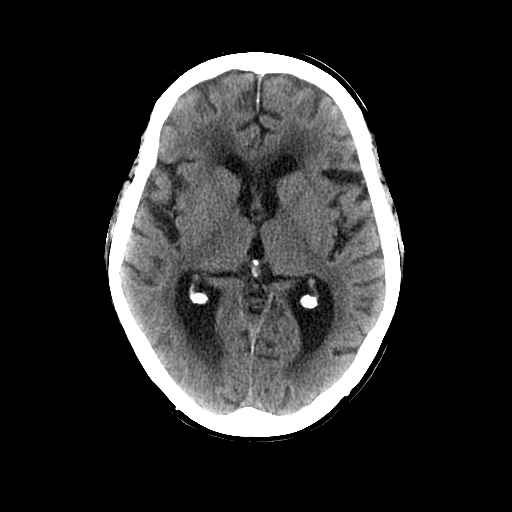
[im 16/28  brain]
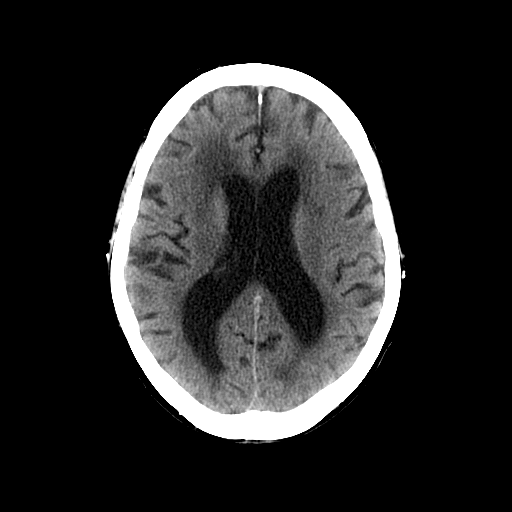
[im 16/28  bone]
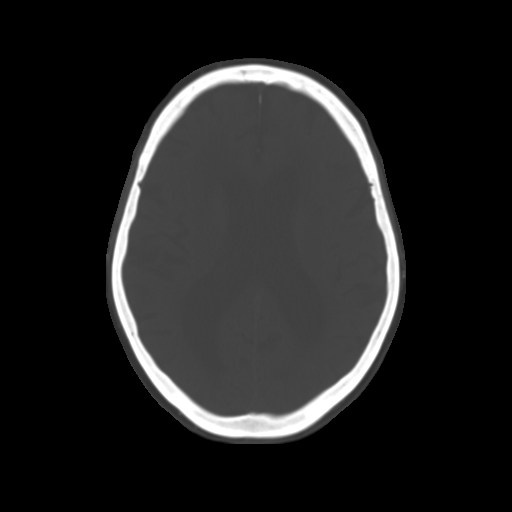
[im 19/28  brain]
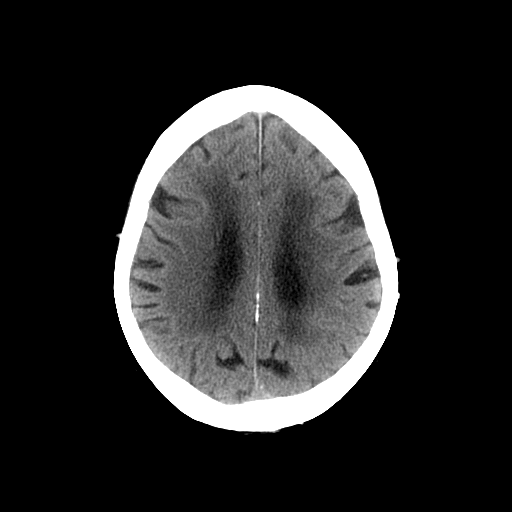
[im 22/28  brain]
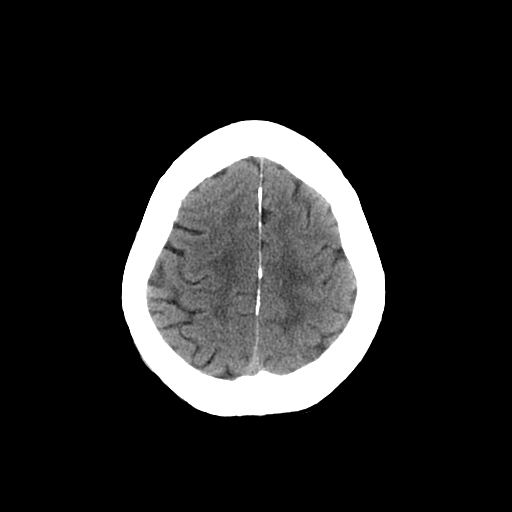
[im 25/28  brain]
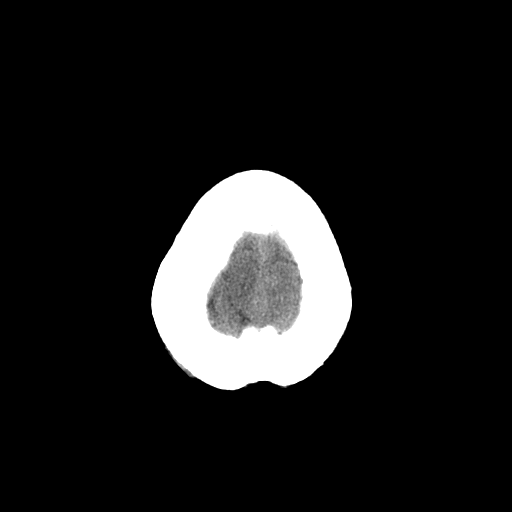

[16 of 30 positions shown; findings below may reference images not displayed]

This examination was performed at [HOSPITAL]. The
interpretation will be provided by [HOSPITAL]

## 2014-10-03 IMAGING — CR DG CHEST 1V PORT
1 series · 1 of 1 positions shown · non-contrast
Comparison: 06/15/2012

CLINICAL DATA: Cough and congestion.

PORTABLE CHEST - 1 VIEW

[AP]
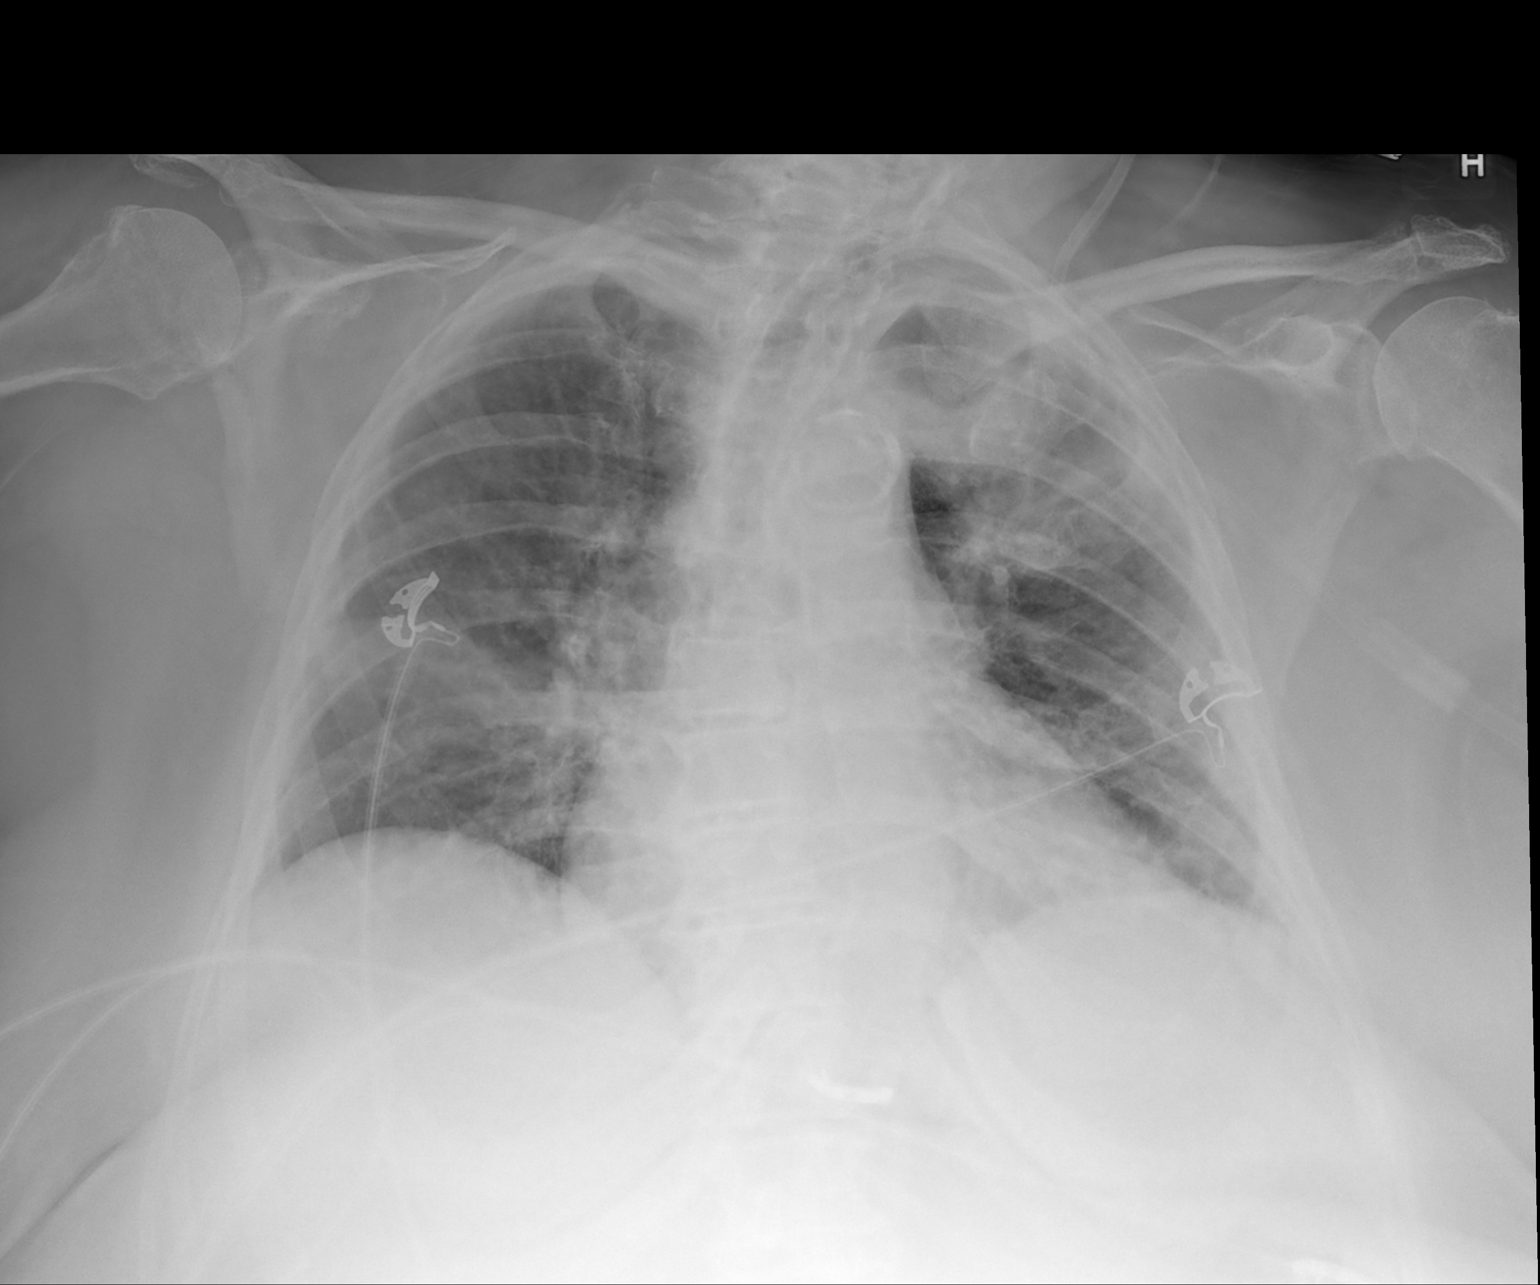

[1 of 1 positions shown; findings below may reference images not displayed]

FINDINGS: Semi upright portable view of the chest was obtained.
The patient continues to have slightly low lung volumes.  There are
asymmetric densities in the left upper lung and apex region.  There
is concern for airspace disease and cannot exclude a lesion in this
area.  There are streaky densities at the left lung base and right
mid lung.  Heart and mediastinum are within normal limits.  The
thoracic aorta is heavily calcified.
IMPRESSION: There is concern for patchy parenchymal disease that could
represent airspace disease and infection.  In particular, there is
concern for opacities in the left apical region. This area could be
further evaluated with a chest CT to exclude an underlying lesion.

These results will be called to the ordering clinician or
representative by the Radiologist Assistant, and communication
documented in the PACS Dashboard.

## 2014-10-04 IMAGING — CR DG CHEST 1V PORT
1 series · 1 of 1 positions shown · non-contrast
Comparison: Portable exam 4888 hours compared to 1232 hours

CLINICAL DATA: PICC line adjustment

PORTABLE CHEST - 1 VIEW

[AP]
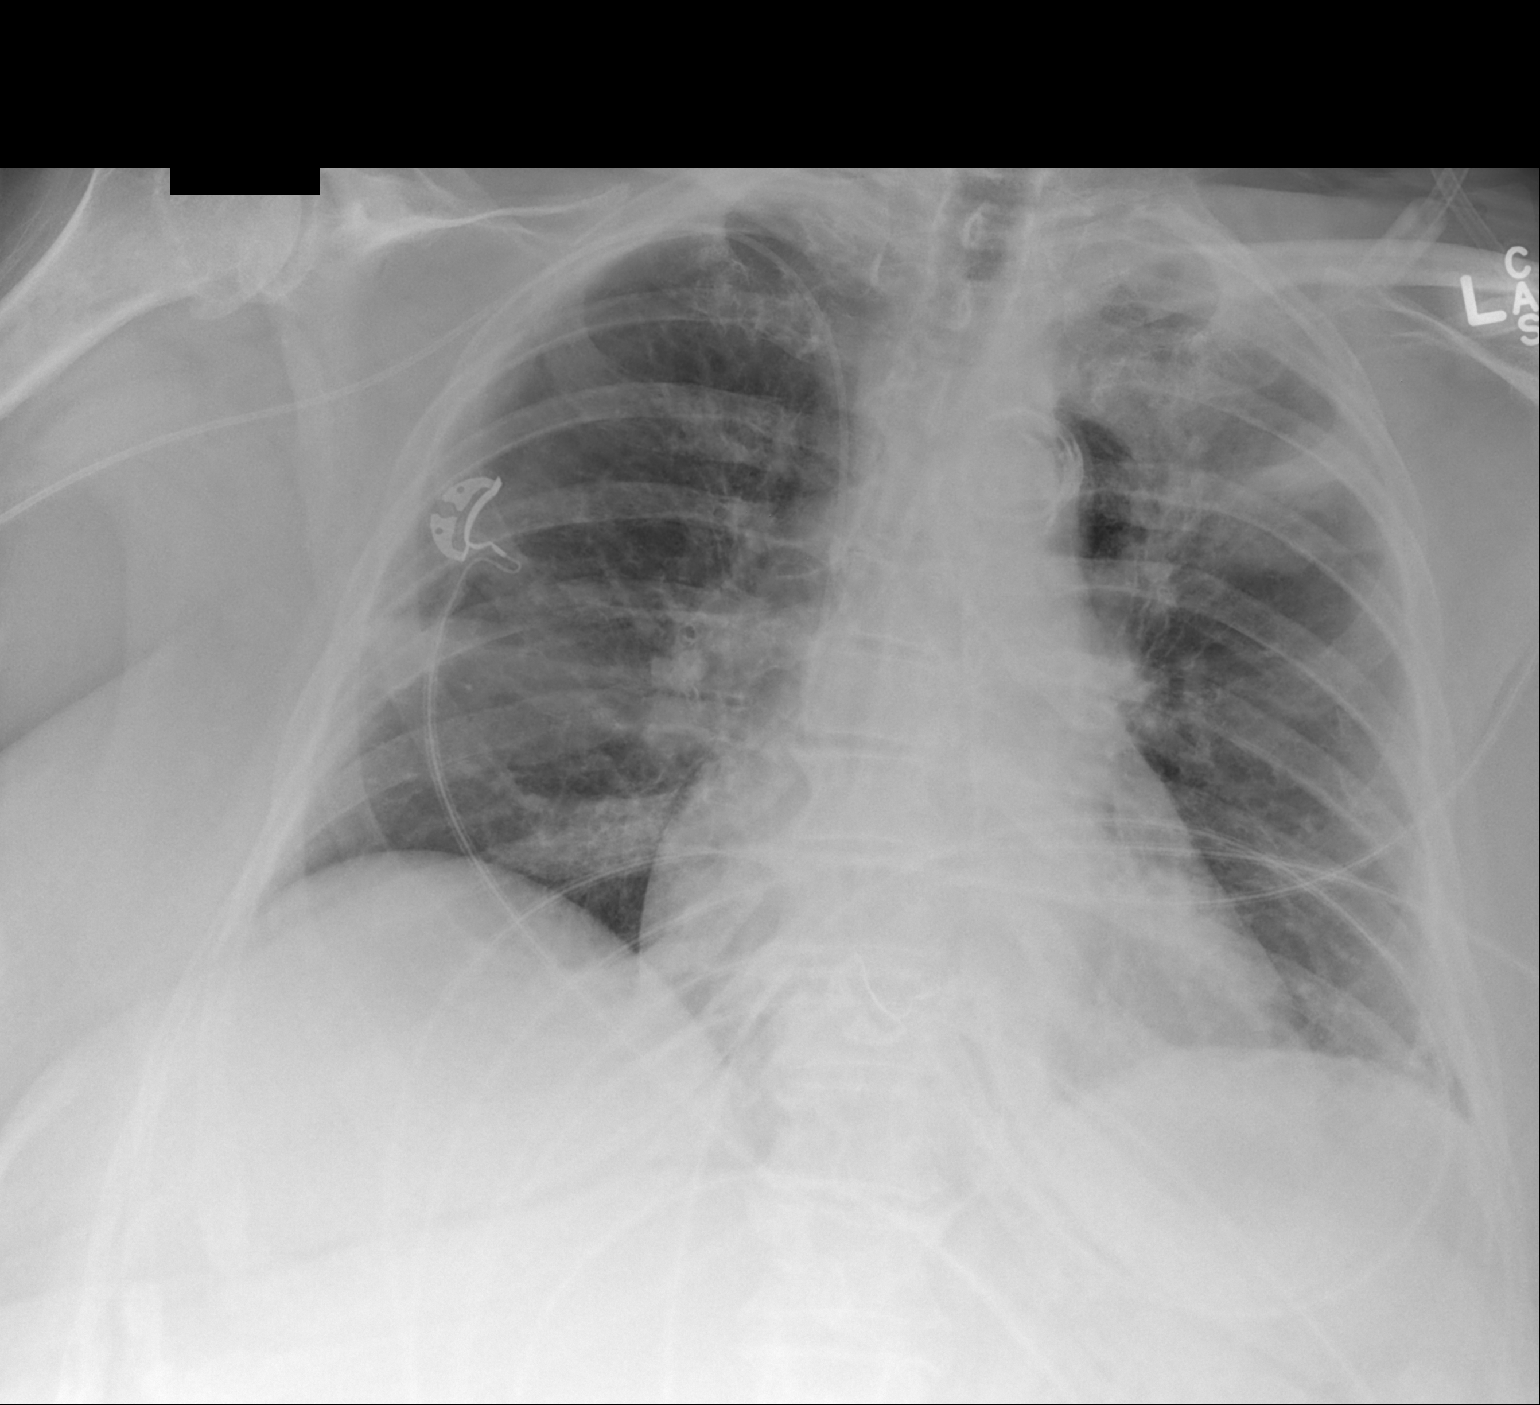

[1 of 1 positions shown; findings below may reference images not displayed]

FINDINGS: Tip of right arm PICC line projects over SVC above cavoatrial
junction.
Upper normal heart size.
Atherosclerotic calcification aorta.
Persistent right middle lobe and left upper lobe infiltrates.
Right glenohumeral degenerative changes noted.
No pleural effusion or pneumothorax.
IMPRESSION: Tip of right arm PICC line projects over SVC above cavoatrial
junction.
Persistent pulmonary infiltrates.
